# Patient Record
Sex: Female | Born: 1935 | Race: Black or African American | Hispanic: No | Marital: Single | Smoking: Never smoker
Health system: Southern US, Community
[De-identification: ages and names within clinical notes are randomized; demographics above are authoritative.]

## PROBLEM LIST (undated history)

## (undated) DIAGNOSIS — D539 Nutritional anemia, unspecified: Secondary | ICD-10-CM

## (undated) DIAGNOSIS — I639 Cerebral infarction, unspecified: Secondary | ICD-10-CM

## (undated) DIAGNOSIS — I471 Supraventricular tachycardia, unspecified: Secondary | ICD-10-CM

## (undated) DIAGNOSIS — I10 Essential (primary) hypertension: Secondary | ICD-10-CM

## (undated) DIAGNOSIS — F32A Depression, unspecified: Secondary | ICD-10-CM

## (undated) DIAGNOSIS — M810 Age-related osteoporosis without current pathological fracture: Secondary | ICD-10-CM

## (undated) DIAGNOSIS — M75100 Unspecified rotator cuff tear or rupture of unspecified shoulder, not specified as traumatic: Secondary | ICD-10-CM

## (undated) DIAGNOSIS — G2581 Restless legs syndrome: Secondary | ICD-10-CM

## (undated) DIAGNOSIS — M12819 Other specific arthropathies, not elsewhere classified, unspecified shoulder: Secondary | ICD-10-CM

## (undated) DIAGNOSIS — R51 Headache: Secondary | ICD-10-CM

## (undated) DIAGNOSIS — K219 Gastro-esophageal reflux disease without esophagitis: Secondary | ICD-10-CM

## (undated) DIAGNOSIS — R195 Other fecal abnormalities: Secondary | ICD-10-CM

## (undated) DIAGNOSIS — R519 Headache, unspecified: Secondary | ICD-10-CM

## (undated) DIAGNOSIS — M48061 Spinal stenosis, lumbar region without neurogenic claudication: Secondary | ICD-10-CM

## (undated) DIAGNOSIS — N289 Disorder of kidney and ureter, unspecified: Secondary | ICD-10-CM

## (undated) DIAGNOSIS — E875 Hyperkalemia: Secondary | ICD-10-CM

## (undated) DIAGNOSIS — G894 Chronic pain syndrome: Secondary | ICD-10-CM

## (undated) DIAGNOSIS — D649 Anemia, unspecified: Secondary | ICD-10-CM

## (undated) DIAGNOSIS — M1990 Unspecified osteoarthritis, unspecified site: Secondary | ICD-10-CM

## (undated) DIAGNOSIS — H269 Unspecified cataract: Secondary | ICD-10-CM

## (undated) DIAGNOSIS — F329 Major depressive disorder, single episode, unspecified: Secondary | ICD-10-CM

## (undated) DIAGNOSIS — R0982 Postnasal drip: Secondary | ICD-10-CM

## (undated) DIAGNOSIS — I4719 Other supraventricular tachycardia: Secondary | ICD-10-CM

## (undated) DIAGNOSIS — Z9289 Personal history of other medical treatment: Secondary | ICD-10-CM

## (undated) HISTORY — PX: ABDOMINAL HYSTERECTOMY: SHX81

## (undated) HISTORY — PX: BACK SURGERY: SHX140

## (undated) HISTORY — DX: Essential (primary) hypertension: I10

## (undated) HISTORY — DX: Unspecified osteoarthritis, unspecified site: M19.90

## (undated) HISTORY — DX: Disorder of kidney and ureter, unspecified: N28.9

## (undated) HISTORY — DX: Depression, unspecified: F32.A

## (undated) HISTORY — DX: Restless legs syndrome: G25.81

## (undated) HISTORY — DX: Unspecified rotator cuff tear or rupture of unspecified shoulder, not specified as traumatic: M75.100

## (undated) HISTORY — PX: EYE SURGERY: SHX253

## (undated) HISTORY — DX: Age-related osteoporosis without current pathological fracture: M81.0

## (undated) HISTORY — DX: Nutritional anemia, unspecified: D53.9

## (undated) HISTORY — DX: Supraventricular tachycardia: I47.1

## (undated) HISTORY — DX: Postnasal drip: R09.82

## (undated) HISTORY — PX: ORIF FINGER FRACTURE: SHX2122

## (undated) HISTORY — DX: Anemia, unspecified: D64.9

## (undated) HISTORY — DX: Cerebral infarction, unspecified: I63.9

## (undated) HISTORY — DX: Chronic pain syndrome: G89.4

## (undated) HISTORY — DX: Headache, unspecified: R51.9

## (undated) HISTORY — DX: Unspecified cataract: H26.9

## (undated) HISTORY — PX: ROTATOR CUFF REPAIR: SHX139

## (undated) HISTORY — DX: Hyperkalemia: E87.5

## (undated) HISTORY — DX: Other supraventricular tachycardia: I47.19

## (undated) HISTORY — DX: Other fecal abnormalities: R19.5

## (undated) HISTORY — DX: Spinal stenosis, lumbar region without neurogenic claudication: M48.061

## (undated) HISTORY — DX: Other specific arthropathies, not elsewhere classified, unspecified shoulder: M12.819

## (undated) HISTORY — DX: Headache: R51

## (undated) HISTORY — DX: Major depressive disorder, single episode, unspecified: F32.9

---

## 1998-02-05 ENCOUNTER — Encounter: Admission: RE | Admit: 1998-02-05 | Discharge: 1998-02-05 | Payer: Self-pay | Admitting: Hematology and Oncology

## 1998-06-22 ENCOUNTER — Encounter: Admission: RE | Admit: 1998-06-22 | Discharge: 1998-06-22 | Payer: Self-pay | Admitting: Hematology and Oncology

## 1998-07-17 ENCOUNTER — Ambulatory Visit (HOSPITAL_COMMUNITY): Admission: RE | Admit: 1998-07-17 | Discharge: 1998-07-17 | Payer: Self-pay | Admitting: Internal Medicine

## 1998-07-17 ENCOUNTER — Encounter: Admission: RE | Admit: 1998-07-17 | Discharge: 1998-07-17 | Payer: Self-pay | Admitting: Internal Medicine

## 1998-07-29 ENCOUNTER — Encounter: Admission: RE | Admit: 1998-07-29 | Discharge: 1998-07-29 | Payer: Self-pay | Admitting: Hematology and Oncology

## 1998-08-04 ENCOUNTER — Encounter: Admission: RE | Admit: 1998-08-04 | Discharge: 1998-11-02 | Payer: Self-pay | Admitting: *Deleted

## 1998-09-25 ENCOUNTER — Encounter: Admission: RE | Admit: 1998-09-25 | Discharge: 1998-09-25 | Payer: Self-pay | Admitting: Hematology and Oncology

## 1998-10-05 ENCOUNTER — Ambulatory Visit (HOSPITAL_COMMUNITY): Admission: RE | Admit: 1998-10-05 | Discharge: 1998-10-05 | Payer: Self-pay | Admitting: *Deleted

## 1998-11-17 ENCOUNTER — Ambulatory Visit (HOSPITAL_COMMUNITY): Admission: RE | Admit: 1998-11-17 | Discharge: 1998-11-17 | Payer: Self-pay | Admitting: Neurosurgery

## 1998-11-17 ENCOUNTER — Encounter: Payer: Self-pay | Admitting: Neurosurgery

## 1998-12-31 ENCOUNTER — Encounter: Payer: Self-pay | Admitting: Neurosurgery

## 1999-01-05 ENCOUNTER — Encounter: Payer: Self-pay | Admitting: Neurosurgery

## 1999-01-05 ENCOUNTER — Inpatient Hospital Stay (HOSPITAL_COMMUNITY): Admission: RE | Admit: 1999-01-05 | Discharge: 1999-01-08 | Payer: Self-pay | Admitting: Neurosurgery

## 1999-02-08 ENCOUNTER — Encounter: Payer: Self-pay | Admitting: Neurosurgery

## 1999-02-08 ENCOUNTER — Ambulatory Visit (HOSPITAL_COMMUNITY): Admission: RE | Admit: 1999-02-08 | Discharge: 1999-02-08 | Payer: Self-pay | Admitting: Neurosurgery

## 1999-04-12 ENCOUNTER — Encounter: Admission: RE | Admit: 1999-04-12 | Discharge: 1999-04-12 | Payer: Self-pay | Admitting: Internal Medicine

## 1999-04-21 ENCOUNTER — Encounter: Payer: Self-pay | Admitting: Neurosurgery

## 1999-04-21 ENCOUNTER — Ambulatory Visit (HOSPITAL_COMMUNITY): Admission: RE | Admit: 1999-04-21 | Discharge: 1999-04-21 | Payer: Self-pay | Admitting: Neurosurgery

## 1999-06-28 ENCOUNTER — Encounter: Admission: RE | Admit: 1999-06-28 | Discharge: 1999-06-28 | Payer: Self-pay | Admitting: Internal Medicine

## 1999-10-25 ENCOUNTER — Encounter: Admission: RE | Admit: 1999-10-25 | Discharge: 1999-10-25 | Payer: Self-pay | Admitting: Internal Medicine

## 1999-11-03 ENCOUNTER — Ambulatory Visit (HOSPITAL_COMMUNITY): Admission: RE | Admit: 1999-11-03 | Discharge: 1999-11-03 | Payer: Self-pay | Admitting: *Deleted

## 1999-11-16 ENCOUNTER — Encounter: Payer: Self-pay | Admitting: Neurosurgery

## 1999-11-16 ENCOUNTER — Ambulatory Visit (HOSPITAL_COMMUNITY): Admission: RE | Admit: 1999-11-16 | Discharge: 1999-11-16 | Payer: Self-pay | Admitting: Neurosurgery

## 2000-01-31 ENCOUNTER — Encounter: Admission: RE | Admit: 2000-01-31 | Discharge: 2000-01-31 | Payer: Self-pay | Admitting: Internal Medicine

## 2000-04-18 ENCOUNTER — Encounter: Admission: RE | Admit: 2000-04-18 | Discharge: 2000-04-18 | Payer: Self-pay | Admitting: Internal Medicine

## 2000-07-06 ENCOUNTER — Encounter: Admission: RE | Admit: 2000-07-06 | Discharge: 2000-07-06 | Payer: Self-pay | Admitting: Hematology and Oncology

## 2000-11-09 ENCOUNTER — Ambulatory Visit (HOSPITAL_COMMUNITY): Admission: RE | Admit: 2000-11-09 | Discharge: 2000-11-09 | Payer: Self-pay | Admitting: *Deleted

## 2001-01-11 ENCOUNTER — Encounter: Admission: RE | Admit: 2001-01-11 | Discharge: 2001-01-11 | Payer: Self-pay | Admitting: Hematology and Oncology

## 2001-06-29 ENCOUNTER — Encounter: Admission: RE | Admit: 2001-06-29 | Discharge: 2001-06-29 | Payer: Self-pay | Admitting: Internal Medicine

## 2001-09-03 ENCOUNTER — Encounter: Admission: RE | Admit: 2001-09-03 | Discharge: 2001-09-03 | Payer: Self-pay | Admitting: Internal Medicine

## 2001-11-12 ENCOUNTER — Ambulatory Visit (HOSPITAL_COMMUNITY): Admission: RE | Admit: 2001-11-12 | Discharge: 2001-11-12 | Payer: Self-pay | Admitting: Internal Medicine

## 2001-11-12 ENCOUNTER — Encounter: Payer: Self-pay | Admitting: Internal Medicine

## 2002-01-11 ENCOUNTER — Encounter: Admission: RE | Admit: 2002-01-11 | Discharge: 2002-01-11 | Payer: Self-pay | Admitting: Internal Medicine

## 2002-04-26 ENCOUNTER — Encounter: Admission: RE | Admit: 2002-04-26 | Discharge: 2002-04-26 | Payer: Self-pay | Admitting: Internal Medicine

## 2002-07-03 ENCOUNTER — Encounter: Admission: RE | Admit: 2002-07-03 | Discharge: 2002-07-03 | Payer: Self-pay | Admitting: Internal Medicine

## 2002-07-17 ENCOUNTER — Encounter: Admission: RE | Admit: 2002-07-17 | Discharge: 2002-07-17 | Payer: Self-pay | Admitting: Internal Medicine

## 2002-09-02 ENCOUNTER — Encounter: Admission: RE | Admit: 2002-09-02 | Discharge: 2002-09-02 | Payer: Self-pay | Admitting: Internal Medicine

## 2002-09-17 ENCOUNTER — Ambulatory Visit (HOSPITAL_COMMUNITY): Admission: RE | Admit: 2002-09-17 | Discharge: 2002-09-17 | Payer: Self-pay | Admitting: Internal Medicine

## 2002-09-17 ENCOUNTER — Encounter: Payer: Self-pay | Admitting: Internal Medicine

## 2002-09-17 ENCOUNTER — Encounter: Admission: RE | Admit: 2002-09-17 | Discharge: 2002-09-17 | Payer: Self-pay | Admitting: Internal Medicine

## 2002-10-10 HISTORY — PX: DOPPLER ECHOCARDIOGRAPHY: SHX263

## 2002-10-16 ENCOUNTER — Encounter: Admission: RE | Admit: 2002-10-16 | Discharge: 2002-10-16 | Payer: Self-pay | Admitting: Internal Medicine

## 2002-10-24 ENCOUNTER — Encounter: Admission: RE | Admit: 2002-10-24 | Discharge: 2002-10-24 | Payer: Self-pay | Admitting: Internal Medicine

## 2002-11-12 ENCOUNTER — Encounter: Payer: Self-pay | Admitting: Neurosurgery

## 2002-11-12 ENCOUNTER — Ambulatory Visit (HOSPITAL_COMMUNITY): Admission: RE | Admit: 2002-11-12 | Discharge: 2002-11-12 | Payer: Self-pay | Admitting: Neurosurgery

## 2002-11-14 ENCOUNTER — Encounter: Payer: Self-pay | Admitting: Internal Medicine

## 2002-11-14 ENCOUNTER — Ambulatory Visit (HOSPITAL_COMMUNITY): Admission: RE | Admit: 2002-11-14 | Discharge: 2002-11-14 | Payer: Self-pay | Admitting: Internal Medicine

## 2002-12-11 ENCOUNTER — Ambulatory Visit (HOSPITAL_COMMUNITY): Admission: RE | Admit: 2002-12-11 | Discharge: 2002-12-11 | Payer: Self-pay | Admitting: Internal Medicine

## 2002-12-11 ENCOUNTER — Encounter: Payer: Self-pay | Admitting: Internal Medicine

## 2002-12-11 ENCOUNTER — Encounter: Admission: RE | Admit: 2002-12-11 | Discharge: 2002-12-11 | Payer: Self-pay | Admitting: Internal Medicine

## 2002-12-25 ENCOUNTER — Encounter: Payer: Self-pay | Admitting: Cardiology

## 2002-12-25 ENCOUNTER — Ambulatory Visit (HOSPITAL_COMMUNITY): Admission: RE | Admit: 2002-12-25 | Discharge: 2002-12-25 | Payer: Self-pay | Admitting: Internal Medicine

## 2003-01-03 ENCOUNTER — Encounter: Admission: RE | Admit: 2003-01-03 | Discharge: 2003-01-03 | Payer: Self-pay | Admitting: Internal Medicine

## 2003-02-12 ENCOUNTER — Encounter: Payer: Self-pay | Admitting: Neurosurgery

## 2003-02-17 ENCOUNTER — Encounter: Payer: Self-pay | Admitting: Neurosurgery

## 2003-02-17 ENCOUNTER — Inpatient Hospital Stay (HOSPITAL_COMMUNITY): Admission: RE | Admit: 2003-02-17 | Discharge: 2003-02-20 | Payer: Self-pay | Admitting: Neurosurgery

## 2003-03-11 ENCOUNTER — Encounter: Payer: Self-pay | Admitting: Neurosurgery

## 2003-03-11 ENCOUNTER — Encounter: Admission: RE | Admit: 2003-03-11 | Discharge: 2003-03-11 | Payer: Self-pay | Admitting: Neurosurgery

## 2003-05-15 ENCOUNTER — Encounter: Payer: Self-pay | Admitting: Neurosurgery

## 2003-05-15 ENCOUNTER — Encounter: Admission: RE | Admit: 2003-05-15 | Discharge: 2003-05-15 | Payer: Self-pay | Admitting: Neurosurgery

## 2003-06-26 ENCOUNTER — Encounter: Payer: Self-pay | Admitting: Neurosurgery

## 2003-06-26 ENCOUNTER — Encounter: Admission: RE | Admit: 2003-06-26 | Discharge: 2003-06-26 | Payer: Self-pay | Admitting: Neurosurgery

## 2003-07-16 ENCOUNTER — Encounter: Admission: RE | Admit: 2003-07-16 | Discharge: 2003-07-16 | Payer: Self-pay | Admitting: Internal Medicine

## 2003-07-18 ENCOUNTER — Ambulatory Visit (HOSPITAL_COMMUNITY): Admission: RE | Admit: 2003-07-18 | Discharge: 2003-07-18 | Payer: Self-pay | Admitting: Neurosurgery

## 2003-07-18 ENCOUNTER — Encounter: Payer: Self-pay | Admitting: Neurosurgery

## 2003-09-26 ENCOUNTER — Encounter: Admission: RE | Admit: 2003-09-26 | Discharge: 2003-09-26 | Payer: Self-pay | Admitting: Internal Medicine

## 2003-10-07 ENCOUNTER — Encounter: Admission: RE | Admit: 2003-10-07 | Discharge: 2003-10-07 | Payer: Self-pay | Admitting: Internal Medicine

## 2003-10-07 ENCOUNTER — Encounter (INDEPENDENT_AMBULATORY_CARE_PROVIDER_SITE_OTHER): Payer: Self-pay | Admitting: Internal Medicine

## 2003-10-08 ENCOUNTER — Encounter (HOSPITAL_BASED_OUTPATIENT_CLINIC_OR_DEPARTMENT_OTHER): Admission: RE | Admit: 2003-10-08 | Discharge: 2003-10-28 | Payer: Self-pay | Admitting: Internal Medicine

## 2003-10-09 ENCOUNTER — Encounter: Admission: RE | Admit: 2003-10-09 | Discharge: 2003-10-09 | Payer: Self-pay | Admitting: Internal Medicine

## 2003-11-12 ENCOUNTER — Ambulatory Visit (HOSPITAL_COMMUNITY): Admission: RE | Admit: 2003-11-12 | Discharge: 2003-11-12 | Payer: Self-pay | Admitting: Internal Medicine

## 2003-11-14 ENCOUNTER — Encounter: Admission: RE | Admit: 2003-11-14 | Discharge: 2003-11-14 | Payer: Self-pay | Admitting: Internal Medicine

## 2003-11-28 ENCOUNTER — Encounter: Admission: RE | Admit: 2003-11-28 | Discharge: 2003-11-28 | Payer: Self-pay | Admitting: Internal Medicine

## 2003-12-13 ENCOUNTER — Ambulatory Visit (HOSPITAL_COMMUNITY): Admission: RE | Admit: 2003-12-13 | Discharge: 2003-12-13 | Payer: Self-pay | Admitting: Internal Medicine

## 2003-12-16 ENCOUNTER — Encounter: Admission: RE | Admit: 2003-12-16 | Discharge: 2003-12-16 | Payer: Self-pay | Admitting: Neurosurgery

## 2003-12-31 ENCOUNTER — Encounter: Admission: RE | Admit: 2003-12-31 | Discharge: 2003-12-31 | Payer: Self-pay | Admitting: Neurosurgery

## 2004-01-21 ENCOUNTER — Encounter: Admission: RE | Admit: 2004-01-21 | Discharge: 2004-01-21 | Payer: Self-pay | Admitting: Neurosurgery

## 2004-02-06 ENCOUNTER — Encounter: Admission: RE | Admit: 2004-02-06 | Discharge: 2004-02-06 | Payer: Self-pay | Admitting: Internal Medicine

## 2004-03-26 ENCOUNTER — Encounter: Admission: RE | Admit: 2004-03-26 | Discharge: 2004-03-26 | Payer: Self-pay | Admitting: Internal Medicine

## 2004-05-04 ENCOUNTER — Encounter: Admission: RE | Admit: 2004-05-04 | Discharge: 2004-05-04 | Payer: Self-pay | Admitting: Internal Medicine

## 2004-05-04 ENCOUNTER — Ambulatory Visit (HOSPITAL_COMMUNITY): Admission: RE | Admit: 2004-05-04 | Discharge: 2004-05-04 | Payer: Self-pay | Admitting: Internal Medicine

## 2004-06-18 ENCOUNTER — Ambulatory Visit: Payer: Self-pay | Admitting: Internal Medicine

## 2004-07-09 ENCOUNTER — Ambulatory Visit: Payer: Self-pay | Admitting: Internal Medicine

## 2004-07-16 ENCOUNTER — Encounter (INDEPENDENT_AMBULATORY_CARE_PROVIDER_SITE_OTHER): Payer: Self-pay | Admitting: Specialist

## 2004-07-16 ENCOUNTER — Ambulatory Visit (HOSPITAL_COMMUNITY): Admission: RE | Admit: 2004-07-16 | Discharge: 2004-07-16 | Payer: Self-pay | Admitting: General Surgery

## 2004-07-29 ENCOUNTER — Ambulatory Visit: Payer: Self-pay | Admitting: Internal Medicine

## 2004-09-17 ENCOUNTER — Ambulatory Visit: Payer: Self-pay | Admitting: Internal Medicine

## 2004-09-22 ENCOUNTER — Ambulatory Visit (HOSPITAL_COMMUNITY): Admission: RE | Admit: 2004-09-22 | Discharge: 2004-09-22 | Payer: Self-pay | Admitting: Internal Medicine

## 2004-09-27 ENCOUNTER — Encounter: Admission: RE | Admit: 2004-09-27 | Discharge: 2004-09-27 | Payer: Self-pay | Admitting: Neurosurgery

## 2004-10-13 ENCOUNTER — Encounter: Admission: RE | Admit: 2004-10-13 | Discharge: 2004-10-13 | Payer: Self-pay | Admitting: Neurosurgery

## 2004-11-04 ENCOUNTER — Encounter: Admission: RE | Admit: 2004-11-04 | Discharge: 2004-11-04 | Payer: Self-pay | Admitting: Neurosurgery

## 2004-11-12 ENCOUNTER — Ambulatory Visit (HOSPITAL_COMMUNITY): Admission: RE | Admit: 2004-11-12 | Discharge: 2004-11-12 | Payer: Self-pay | Admitting: Internal Medicine

## 2005-01-14 ENCOUNTER — Ambulatory Visit: Payer: Self-pay | Admitting: Internal Medicine

## 2005-02-04 ENCOUNTER — Ambulatory Visit: Payer: Self-pay | Admitting: Internal Medicine

## 2005-02-21 ENCOUNTER — Encounter (INDEPENDENT_AMBULATORY_CARE_PROVIDER_SITE_OTHER): Payer: Self-pay | Admitting: Specialist

## 2005-02-21 ENCOUNTER — Ambulatory Visit (HOSPITAL_COMMUNITY): Admission: RE | Admit: 2005-02-21 | Discharge: 2005-02-21 | Payer: Self-pay | Admitting: Gastroenterology

## 2005-02-22 ENCOUNTER — Ambulatory Visit (HOSPITAL_COMMUNITY): Admission: RE | Admit: 2005-02-22 | Discharge: 2005-02-22 | Payer: Self-pay | Admitting: Gastroenterology

## 2005-02-22 ENCOUNTER — Ambulatory Visit: Payer: Self-pay | Admitting: Internal Medicine

## 2005-02-24 ENCOUNTER — Ambulatory Visit (HOSPITAL_COMMUNITY): Admission: RE | Admit: 2005-02-24 | Discharge: 2005-02-24 | Payer: Self-pay | Admitting: Gastroenterology

## 2005-03-08 ENCOUNTER — Ambulatory Visit: Payer: Self-pay | Admitting: Internal Medicine

## 2005-03-11 ENCOUNTER — Ambulatory Visit (HOSPITAL_COMMUNITY): Admission: RE | Admit: 2005-03-11 | Discharge: 2005-03-11 | Payer: Self-pay | Admitting: Internal Medicine

## 2005-03-22 ENCOUNTER — Ambulatory Visit: Payer: Self-pay | Admitting: Internal Medicine

## 2005-08-23 ENCOUNTER — Ambulatory Visit: Payer: Self-pay | Admitting: Internal Medicine

## 2005-08-30 ENCOUNTER — Ambulatory Visit: Payer: Self-pay | Admitting: Internal Medicine

## 2005-08-31 ENCOUNTER — Ambulatory Visit: Payer: Self-pay | Admitting: Internal Medicine

## 2005-12-23 ENCOUNTER — Encounter (INDEPENDENT_AMBULATORY_CARE_PROVIDER_SITE_OTHER): Payer: Self-pay | Admitting: Internal Medicine

## 2005-12-23 ENCOUNTER — Ambulatory Visit (HOSPITAL_COMMUNITY): Admission: RE | Admit: 2005-12-23 | Discharge: 2005-12-23 | Payer: Self-pay | Admitting: Internal Medicine

## 2006-02-14 ENCOUNTER — Ambulatory Visit: Payer: Self-pay | Admitting: Internal Medicine

## 2006-03-03 ENCOUNTER — Ambulatory Visit (HOSPITAL_COMMUNITY): Admission: RE | Admit: 2006-03-03 | Discharge: 2006-03-03 | Payer: Self-pay | Admitting: Internal Medicine

## 2006-03-07 ENCOUNTER — Ambulatory Visit (HOSPITAL_COMMUNITY): Admission: RE | Admit: 2006-03-07 | Discharge: 2006-03-07 | Payer: Self-pay | Admitting: Internal Medicine

## 2006-03-14 ENCOUNTER — Encounter: Admission: RE | Admit: 2006-03-14 | Discharge: 2006-03-14 | Payer: Self-pay | Admitting: Internal Medicine

## 2006-03-14 ENCOUNTER — Ambulatory Visit: Payer: Self-pay | Admitting: Internal Medicine

## 2006-03-15 ENCOUNTER — Encounter: Payer: Self-pay | Admitting: Internal Medicine

## 2006-03-28 ENCOUNTER — Ambulatory Visit: Payer: Self-pay | Admitting: Internal Medicine

## 2006-04-06 ENCOUNTER — Ambulatory Visit: Payer: Self-pay | Admitting: Internal Medicine

## 2006-08-07 ENCOUNTER — Encounter: Admission: RE | Admit: 2006-08-07 | Discharge: 2006-08-07 | Payer: Self-pay | Admitting: Neurosurgery

## 2006-08-16 DIAGNOSIS — K649 Unspecified hemorrhoids: Secondary | ICD-10-CM | POA: Insufficient documentation

## 2006-08-16 DIAGNOSIS — H269 Unspecified cataract: Secondary | ICD-10-CM

## 2006-12-29 ENCOUNTER — Ambulatory Visit (HOSPITAL_COMMUNITY): Admission: RE | Admit: 2006-12-29 | Discharge: 2006-12-29 | Payer: Self-pay | Admitting: Family Medicine

## 2007-02-02 ENCOUNTER — Ambulatory Visit: Payer: Self-pay | Admitting: Hospitalist

## 2007-02-02 ENCOUNTER — Encounter (INDEPENDENT_AMBULATORY_CARE_PROVIDER_SITE_OTHER): Payer: Self-pay | Admitting: Internal Medicine

## 2007-02-02 DIAGNOSIS — J309 Allergic rhinitis, unspecified: Secondary | ICD-10-CM | POA: Insufficient documentation

## 2007-02-02 DIAGNOSIS — M75120 Complete rotator cuff tear or rupture of unspecified shoulder, not specified as traumatic: Secondary | ICD-10-CM

## 2007-02-02 DIAGNOSIS — M48061 Spinal stenosis, lumbar region without neurogenic claudication: Secondary | ICD-10-CM | POA: Insufficient documentation

## 2007-02-09 ENCOUNTER — Ambulatory Visit: Payer: Self-pay | Admitting: *Deleted

## 2007-02-09 ENCOUNTER — Encounter (INDEPENDENT_AMBULATORY_CARE_PROVIDER_SITE_OTHER): Payer: Self-pay | Admitting: Internal Medicine

## 2007-02-09 LAB — CONVERTED CEMR LAB
Cholesterol: 167 mg/dL (ref 0–200)
HDL: 52 mg/dL (ref >39)
Triglycerides: 135 mg/dL (ref <150)
VLDL: 27 mg/dL (ref 0–40)

## 2007-02-15 LAB — CONVERTED CEMR LAB
Albumin: 4 g/dL (ref 3.5–5.2)
BUN: 24 mg/dL — ABNORMAL HIGH (ref 6–23)
CO2: 25 meq/L (ref 19–32)
Glucose, Bld: 84 mg/dL (ref 70–99)
Hemoglobin: 9.7 g/dL — ABNORMAL LOW (ref 12.0–15.0)
MCV: 82.4 fL (ref 78.0–100.0)
RBC: 3.86 M/uL — ABNORMAL LOW (ref 3.87–5.11)
Sodium: 144 meq/L (ref 135–145)
Total Bilirubin: 0.3 mg/dL (ref 0.3–1.2)
Total Protein: 7.8 g/dL (ref 6.0–8.3)
WBC: 10.9 10*3/uL — ABNORMAL HIGH (ref 4.0–10.5)

## 2007-03-13 ENCOUNTER — Encounter (INDEPENDENT_AMBULATORY_CARE_PROVIDER_SITE_OTHER): Payer: Self-pay | Admitting: Internal Medicine

## 2007-03-13 ENCOUNTER — Ambulatory Visit: Payer: Self-pay | Admitting: Internal Medicine

## 2007-03-13 DIAGNOSIS — D649 Anemia, unspecified: Secondary | ICD-10-CM

## 2007-03-13 DIAGNOSIS — N259 Disorder resulting from impaired renal tubular function, unspecified: Secondary | ICD-10-CM | POA: Insufficient documentation

## 2007-03-13 LAB — CONVERTED CEMR LAB
HCT: 31.6 % — ABNORMAL LOW (ref 36.0–46.0)
Hemoglobin: 9.9 g/dL — ABNORMAL LOW (ref 12.0–15.0)
RBC: 3.98 M/uL (ref 3.87–5.11)
WBC: 9.6 10*3/uL (ref 4.0–10.5)

## 2007-03-14 LAB — CONVERTED CEMR LAB
Chloride: 109 meq/L (ref 96–112)
Ferritin: 11 ng/mL (ref 10–291)
Potassium: 4.7 meq/L (ref 3.5–5.3)
Saturation Ratios: 8 % — ABNORMAL LOW (ref 20–55)
TIBC: 468 ug/dL (ref 250–470)

## 2007-03-15 ENCOUNTER — Telehealth: Payer: Self-pay | Admitting: *Deleted

## 2007-03-15 LAB — CONVERTED CEMR LAB: OCCULT 3: NEGATIVE

## 2007-03-16 LAB — CONVERTED CEMR LAB: OCCULT 2: NEGATIVE

## 2007-03-19 ENCOUNTER — Ambulatory Visit: Payer: Self-pay | Admitting: Internal Medicine

## 2007-03-27 ENCOUNTER — Other Ambulatory Visit: Admission: RE | Admit: 2007-03-27 | Discharge: 2007-03-27 | Payer: Self-pay | Admitting: Internal Medicine

## 2007-03-27 ENCOUNTER — Ambulatory Visit: Payer: Self-pay | Admitting: Internal Medicine

## 2007-03-27 ENCOUNTER — Encounter (INDEPENDENT_AMBULATORY_CARE_PROVIDER_SITE_OTHER): Payer: Self-pay | Admitting: Internal Medicine

## 2007-05-15 ENCOUNTER — Encounter (INDEPENDENT_AMBULATORY_CARE_PROVIDER_SITE_OTHER): Payer: Self-pay | Admitting: Internal Medicine

## 2007-05-15 ENCOUNTER — Ambulatory Visit (HOSPITAL_COMMUNITY): Admission: RE | Admit: 2007-05-15 | Discharge: 2007-05-15 | Payer: Self-pay | Admitting: *Deleted

## 2007-05-15 ENCOUNTER — Ambulatory Visit: Payer: Self-pay | Admitting: *Deleted

## 2007-05-15 DIAGNOSIS — I10 Essential (primary) hypertension: Secondary | ICD-10-CM

## 2007-05-15 HISTORY — DX: Essential (primary) hypertension: I10

## 2007-05-25 ENCOUNTER — Ambulatory Visit: Payer: Self-pay | Admitting: Internal Medicine

## 2007-06-27 ENCOUNTER — Ambulatory Visit: Payer: Self-pay | Admitting: Internal Medicine

## 2007-06-27 ENCOUNTER — Encounter (INDEPENDENT_AMBULATORY_CARE_PROVIDER_SITE_OTHER): Payer: Self-pay | Admitting: Internal Medicine

## 2007-06-27 ENCOUNTER — Ambulatory Visit (HOSPITAL_COMMUNITY): Admission: RE | Admit: 2007-06-27 | Discharge: 2007-06-27 | Payer: Self-pay | Admitting: Internal Medicine

## 2007-06-27 DIAGNOSIS — M542 Cervicalgia: Secondary | ICD-10-CM | POA: Insufficient documentation

## 2007-06-27 LAB — CONVERTED CEMR LAB
Hemoglobin: 12.9 g/dL (ref 12.0–15.0)
RBC: 4.62 M/uL (ref 3.87–5.11)
RDW: 16.3 % — ABNORMAL HIGH (ref 11.5–14.0)
WBC: 9.8 10*3/uL (ref 4.0–10.5)

## 2007-08-16 ENCOUNTER — Ambulatory Visit: Payer: Self-pay | Admitting: *Deleted

## 2007-11-01 ENCOUNTER — Ambulatory Visit: Payer: Self-pay | Admitting: Internal Medicine

## 2007-11-01 ENCOUNTER — Encounter (INDEPENDENT_AMBULATORY_CARE_PROVIDER_SITE_OTHER): Payer: Self-pay | Admitting: Internal Medicine

## 2007-11-01 LAB — CONVERTED CEMR LAB
BUN: 18 mg/dL (ref 6–23)
Glucose, Bld: 83 mg/dL (ref 70–99)
Hemoglobin: 14.7 g/dL (ref 12.0–15.0)
MCHC: 33.5 g/dL (ref 30.0–36.0)
MCV: 88.7 fL (ref 78.0–100.0)
Potassium: 4 meq/L (ref 3.5–5.3)
RBC: 4.95 M/uL (ref 3.87–5.11)
Total CK: 89 units/L (ref 7–177)

## 2007-11-14 ENCOUNTER — Encounter (INDEPENDENT_AMBULATORY_CARE_PROVIDER_SITE_OTHER): Payer: Self-pay | Admitting: Internal Medicine

## 2007-12-03 ENCOUNTER — Ambulatory Visit: Payer: Self-pay | Admitting: Internal Medicine

## 2007-12-03 DIAGNOSIS — M25559 Pain in unspecified hip: Secondary | ICD-10-CM | POA: Insufficient documentation

## 2007-12-18 ENCOUNTER — Encounter (INDEPENDENT_AMBULATORY_CARE_PROVIDER_SITE_OTHER): Payer: Self-pay | Admitting: Internal Medicine

## 2007-12-25 ENCOUNTER — Telehealth (INDEPENDENT_AMBULATORY_CARE_PROVIDER_SITE_OTHER): Payer: Self-pay | Admitting: Internal Medicine

## 2007-12-28 ENCOUNTER — Telehealth (INDEPENDENT_AMBULATORY_CARE_PROVIDER_SITE_OTHER): Payer: Self-pay | Admitting: Internal Medicine

## 2007-12-31 ENCOUNTER — Telehealth (INDEPENDENT_AMBULATORY_CARE_PROVIDER_SITE_OTHER): Payer: Self-pay | Admitting: Internal Medicine

## 2008-01-01 ENCOUNTER — Ambulatory Visit (HOSPITAL_COMMUNITY): Admission: RE | Admit: 2008-01-01 | Discharge: 2008-01-01 | Payer: Self-pay | Admitting: Internal Medicine

## 2008-01-21 ENCOUNTER — Telehealth: Payer: Self-pay | Admitting: Internal Medicine

## 2008-01-31 ENCOUNTER — Ambulatory Visit: Payer: Self-pay | Admitting: Internal Medicine

## 2008-01-31 ENCOUNTER — Encounter (INDEPENDENT_AMBULATORY_CARE_PROVIDER_SITE_OTHER): Payer: Self-pay | Admitting: Internal Medicine

## 2008-01-31 DIAGNOSIS — G2581 Restless legs syndrome: Secondary | ICD-10-CM | POA: Insufficient documentation

## 2008-01-31 LAB — CONVERTED CEMR LAB
Ferritin: 180 ng/mL (ref 10–291)
MCV: 92.8 fL (ref 78.0–100.0)
RBC: 4.47 M/uL (ref 3.87–5.11)
UIBC: 283 ug/dL
WBC: 10.3 10*3/uL (ref 4.0–10.5)

## 2008-03-27 ENCOUNTER — Telehealth (INDEPENDENT_AMBULATORY_CARE_PROVIDER_SITE_OTHER): Payer: Self-pay | Admitting: Internal Medicine

## 2008-05-19 ENCOUNTER — Encounter (INDEPENDENT_AMBULATORY_CARE_PROVIDER_SITE_OTHER): Payer: Self-pay | Admitting: Internal Medicine

## 2008-05-19 ENCOUNTER — Ambulatory Visit: Payer: Self-pay | Admitting: Internal Medicine

## 2008-05-19 LAB — CONVERTED CEMR LAB
ALT: 15 units/L (ref 0–35)
Albumin: 3.9 g/dL (ref 3.5–5.2)
CO2: 24 meq/L (ref 19–32)
Calcium: 9.5 mg/dL (ref 8.4–10.5)
Chloride: 103 meq/L (ref 96–112)
Creatinine, Ser: 1.12 mg/dL (ref 0.40–1.20)
Potassium: 4.5 meq/L (ref 3.5–5.3)
Sodium: 141 meq/L (ref 135–145)
Total Protein: 8.1 g/dL (ref 6.0–8.3)

## 2008-05-20 ENCOUNTER — Encounter (INDEPENDENT_AMBULATORY_CARE_PROVIDER_SITE_OTHER): Payer: Self-pay | Admitting: Internal Medicine

## 2008-05-27 ENCOUNTER — Ambulatory Visit (HOSPITAL_COMMUNITY): Admission: RE | Admit: 2008-05-27 | Discharge: 2008-05-27 | Payer: Self-pay | Admitting: Internal Medicine

## 2008-05-29 ENCOUNTER — Encounter: Admission: RE | Admit: 2008-05-29 | Discharge: 2008-06-30 | Payer: Self-pay | Admitting: Internal Medicine

## 2008-06-13 ENCOUNTER — Encounter (INDEPENDENT_AMBULATORY_CARE_PROVIDER_SITE_OTHER): Payer: Self-pay | Admitting: Internal Medicine

## 2008-07-30 ENCOUNTER — Encounter (INDEPENDENT_AMBULATORY_CARE_PROVIDER_SITE_OTHER): Payer: Self-pay | Admitting: Internal Medicine

## 2008-09-15 ENCOUNTER — Ambulatory Visit: Payer: Self-pay | Admitting: Internal Medicine

## 2008-12-23 ENCOUNTER — Encounter (INDEPENDENT_AMBULATORY_CARE_PROVIDER_SITE_OTHER): Payer: Self-pay | Admitting: Internal Medicine

## 2008-12-23 ENCOUNTER — Ambulatory Visit: Payer: Self-pay | Admitting: *Deleted

## 2008-12-24 DIAGNOSIS — R740 Nonspecific elevation of levels of transaminase and lactic acid dehydrogenase [LDH]: Secondary | ICD-10-CM

## 2008-12-24 DIAGNOSIS — D72829 Elevated white blood cell count, unspecified: Secondary | ICD-10-CM | POA: Insufficient documentation

## 2008-12-24 LAB — CONVERTED CEMR LAB
ALT: 22 units/L (ref 0–35)
CO2: 22 meq/L (ref 19–32)
Calcium: 9.6 mg/dL (ref 8.4–10.5)
Chloride: 98 meq/L (ref 96–112)
Platelets: 477 10*3/uL — ABNORMAL HIGH (ref 150–400)
Potassium: 5 meq/L (ref 3.5–5.3)
Sodium: 136 meq/L (ref 135–145)
Total Protein: 7.7 g/dL (ref 6.0–8.3)
WBC: 15 10*3/uL — ABNORMAL HIGH (ref 4.0–10.5)

## 2008-12-25 ENCOUNTER — Encounter (INDEPENDENT_AMBULATORY_CARE_PROVIDER_SITE_OTHER): Payer: Self-pay | Admitting: Internal Medicine

## 2008-12-25 ENCOUNTER — Ambulatory Visit: Payer: Self-pay | Admitting: *Deleted

## 2008-12-25 LAB — CONVERTED CEMR LAB
Cholesterol: 141 mg/dL (ref 0–200)
VLDL: 20 mg/dL (ref 0–40)

## 2009-01-19 ENCOUNTER — Ambulatory Visit (HOSPITAL_COMMUNITY): Admission: RE | Admit: 2009-01-19 | Discharge: 2009-01-19 | Payer: Self-pay | Admitting: Internal Medicine

## 2009-01-26 ENCOUNTER — Ambulatory Visit: Payer: Self-pay | Admitting: Pediatrics

## 2009-01-26 ENCOUNTER — Telehealth: Payer: Self-pay | Admitting: *Deleted

## 2009-01-26 ENCOUNTER — Encounter: Payer: Self-pay | Admitting: Internal Medicine

## 2009-01-26 LAB — CONVERTED CEMR LAB
Bilirubin Urine: NEGATIVE
Hemoglobin, Urine: NEGATIVE
Ketones, ur: NEGATIVE mg/dL
Protein, ur: NEGATIVE mg/dL
Urine Glucose: NEGATIVE mg/dL
Urobilinogen, UA: 1 (ref 0.0–1.0)

## 2009-01-27 LAB — CONVERTED CEMR LAB
AST: 39 units/L — ABNORMAL HIGH (ref 0–37)
Albumin: 3.5 g/dL (ref 3.5–5.2)
BUN: 21 mg/dL (ref 6–23)
Basophils Absolute: 0.1 10*3/uL (ref 0.0–0.1)
Basophils Relative: 1 % (ref 0–1)
Calcium: 9.1 mg/dL (ref 8.4–10.5)
Chloride: 100 meq/L (ref 96–112)
Eosinophils Absolute: 0.2 10*3/uL (ref 0.0–0.7)
Eosinophils Relative: 2 % (ref 0–5)
Glucose, Bld: 100 mg/dL — ABNORMAL HIGH (ref 70–99)
Lymphs Abs: 3.4 10*3/uL (ref 0.7–4.0)
MCV: 85.3 fL (ref 78.0–100.0)
Neutrophils Relative %: 56 % (ref 43–77)
Platelets: 462 10*3/uL — ABNORMAL HIGH (ref 150–400)
Potassium: 3.4 meq/L — ABNORMAL LOW (ref 3.5–5.3)
RDW: 18 % — ABNORMAL HIGH (ref 11.5–15.5)
Sodium: 135 meq/L (ref 135–145)
Total Protein: 7.8 g/dL (ref 6.0–8.3)
WBC: 10.6 10*3/uL — ABNORMAL HIGH (ref 4.0–10.5)

## 2009-02-03 ENCOUNTER — Ambulatory Visit: Payer: Self-pay | Admitting: Internal Medicine

## 2009-02-17 ENCOUNTER — Encounter (INDEPENDENT_AMBULATORY_CARE_PROVIDER_SITE_OTHER): Payer: Self-pay | Admitting: Internal Medicine

## 2009-02-17 ENCOUNTER — Ambulatory Visit: Payer: Self-pay | Admitting: Internal Medicine

## 2009-02-17 LAB — CONVERTED CEMR LAB
Ferritin: 39 ng/mL (ref 10–291)
Iron: 30 ug/dL — ABNORMAL LOW (ref 42–145)
Saturation Ratios: 8 % — ABNORMAL LOW (ref 20–55)
TIBC: 388 ug/dL (ref 250–470)
UIBC: 358 ug/dL

## 2009-06-17 ENCOUNTER — Ambulatory Visit: Payer: Self-pay | Admitting: Infectious Diseases

## 2009-06-17 LAB — CONVERTED CEMR LAB
BUN: 14 mg/dL (ref 6–23)
Chloride: 102 meq/L (ref 96–112)
Hemoglobin: 12.4 g/dL (ref 12.0–15.0)
MCHC: 32.5 g/dL (ref 30.0–36.0)
Platelets: 370 10*3/uL (ref 150–400)
Potassium: 4.5 meq/L (ref 3.5–5.3)
RDW: 14.8 % (ref 11.5–15.5)

## 2009-06-18 ENCOUNTER — Telehealth: Payer: Self-pay | Admitting: Internal Medicine

## 2009-06-23 ENCOUNTER — Ambulatory Visit (HOSPITAL_COMMUNITY): Admission: RE | Admit: 2009-06-23 | Discharge: 2009-06-23 | Payer: Self-pay | Admitting: Infectious Diseases

## 2009-06-23 ENCOUNTER — Encounter: Payer: Self-pay | Admitting: Internal Medicine

## 2009-06-23 ENCOUNTER — Ambulatory Visit: Payer: Self-pay | Admitting: Infectious Disease

## 2009-06-23 DIAGNOSIS — K921 Melena: Secondary | ICD-10-CM | POA: Insufficient documentation

## 2009-06-23 LAB — CONVERTED CEMR LAB
OCCULT 1: POSITIVE
OCCULT 2: POSITIVE
OCCULT 3: POSITIVE

## 2009-07-07 ENCOUNTER — Encounter: Payer: Self-pay | Admitting: Internal Medicine

## 2009-07-15 ENCOUNTER — Encounter: Payer: Self-pay | Admitting: Internal Medicine

## 2009-07-17 ENCOUNTER — Encounter: Payer: Self-pay | Admitting: Internal Medicine

## 2009-08-06 ENCOUNTER — Encounter: Payer: Self-pay | Admitting: Internal Medicine

## 2009-12-07 ENCOUNTER — Ambulatory Visit: Payer: Self-pay | Admitting: Internal Medicine

## 2009-12-07 ENCOUNTER — Encounter: Payer: Self-pay | Admitting: Licensed Clinical Social Worker

## 2009-12-07 DIAGNOSIS — Z87898 Personal history of other specified conditions: Secondary | ICD-10-CM

## 2009-12-07 DIAGNOSIS — F329 Major depressive disorder, single episode, unspecified: Secondary | ICD-10-CM

## 2009-12-07 DIAGNOSIS — L851 Acquired keratosis [keratoderma] palmaris et plantaris: Secondary | ICD-10-CM

## 2009-12-07 DIAGNOSIS — R609 Edema, unspecified: Secondary | ICD-10-CM

## 2009-12-08 ENCOUNTER — Telehealth: Payer: Self-pay | Admitting: Licensed Clinical Social Worker

## 2009-12-09 LAB — CONVERTED CEMR LAB
ALT: <8 units/L (ref 0–35)
AST: 23 units/L (ref 0–37)
Albumin: 3 g/dL — ABNORMAL LOW (ref 3.5–5.2)
Alkaline Phosphatase: 81 units/L (ref 39–117)
Basophils Absolute: 0 10*3/uL (ref 0.0–0.1)
Basophils Relative: 0 % (ref 0–1)
Hemoglobin: 9.1 g/dL — ABNORMAL LOW (ref 12.0–15.0)
Lymphocytes Relative: 28 % (ref 12–46)
MCHC: 31.8 g/dL (ref 30.0–36.0)
Neutro Abs: 8 10*3/uL — ABNORMAL HIGH (ref 1.7–7.7)
Neutrophils Relative %: 62 % (ref 43–77)
Potassium: 3.8 meq/L (ref 3.5–5.3)
RBC: 3.35 M/uL — ABNORMAL LOW (ref 3.87–5.11)
RDW: 16.5 % — ABNORMAL HIGH (ref 11.5–15.5)
Sodium: 137 meq/L (ref 135–145)
Total Bilirubin: 0.6 mg/dL (ref 0.3–1.2)
Total Protein: 7.2 g/dL (ref 6.0–8.3)

## 2009-12-11 ENCOUNTER — Encounter: Payer: Self-pay | Admitting: Internal Medicine

## 2009-12-21 ENCOUNTER — Encounter: Payer: Self-pay | Admitting: Internal Medicine

## 2009-12-28 ENCOUNTER — Encounter: Payer: Self-pay | Admitting: Internal Medicine

## 2009-12-31 ENCOUNTER — Encounter: Payer: Self-pay | Admitting: Internal Medicine

## 2010-01-06 ENCOUNTER — Encounter: Payer: Self-pay | Admitting: Internal Medicine

## 2010-01-18 ENCOUNTER — Telehealth: Payer: Self-pay | Admitting: Licensed Clinical Social Worker

## 2010-02-03 ENCOUNTER — Ambulatory Visit: Payer: Self-pay | Admitting: Internal Medicine

## 2010-02-09 LAB — CONVERTED CEMR LAB
AST: 16 units/L (ref 0–37)
Alkaline Phosphatase: 86 units/L (ref 39–117)
BUN: 16 mg/dL (ref 6–23)
Calcium: 8.5 mg/dL (ref 8.4–10.5)
Chloride: 111 meq/L (ref 96–112)
Creatinine, Ser: 0.96 mg/dL (ref 0.40–1.20)
HCT: 25.4 % — ABNORMAL LOW (ref 36.0–46.0)
Hemoglobin: 7.6 g/dL — ABNORMAL LOW (ref 12.0–15.0)
MCHC: 29.9 g/dL — ABNORMAL LOW (ref 30.0–36.0)
MCV: 98.8 fL (ref >=78.0)
RDW: 20.8 % — ABNORMAL HIGH (ref 11.5–15.5)
Total Bilirubin: 0.3 mg/dL (ref 0.3–1.2)

## 2010-02-10 ENCOUNTER — Encounter: Payer: Self-pay | Admitting: Internal Medicine

## 2010-02-12 ENCOUNTER — Ambulatory Visit (HOSPITAL_COMMUNITY): Admission: RE | Admit: 2010-02-12 | Discharge: 2010-02-12 | Payer: Self-pay | Admitting: *Deleted

## 2010-02-23 ENCOUNTER — Ambulatory Visit: Payer: Self-pay | Admitting: Internal Medicine

## 2010-02-23 LAB — CONVERTED CEMR LAB: OCCULT 3: POSITIVE

## 2010-03-10 ENCOUNTER — Encounter: Payer: Self-pay | Admitting: Internal Medicine

## 2010-03-22 ENCOUNTER — Ambulatory Visit: Payer: Self-pay | Admitting: Internal Medicine

## 2010-03-22 LAB — CONVERTED CEMR LAB
ALT: 13 units/L (ref 0–35)
AST: 30 units/L (ref 0–37)
Albumin: 4 g/dL (ref 3.5–5.2)
Calcium: 9.4 mg/dL (ref 8.4–10.5)
Chloride: 105 meq/L (ref 96–112)
Eosinophils Relative: 4 % (ref 0–5)
HCT: 35.6 % — ABNORMAL LOW (ref 36.0–46.0)
Lymphocytes Relative: 37 % (ref 12–46)
Lymphs Abs: 3.7 10*3/uL (ref 0.7–4.0)
Monocytes Relative: 10 % (ref 3–12)
Platelets: 396 10*3/uL (ref 150–400)
Potassium: 4.3 meq/L (ref 3.5–5.3)
RBC: 3.9 M/uL (ref 3.87–5.11)
WBC: 10.1 10*3/uL (ref 4.0–10.5)

## 2010-03-29 ENCOUNTER — Telehealth: Payer: Self-pay | Admitting: Licensed Clinical Social Worker

## 2010-05-11 ENCOUNTER — Telehealth: Payer: Self-pay | Admitting: Internal Medicine

## 2010-06-08 ENCOUNTER — Telehealth: Payer: Self-pay | Admitting: Internal Medicine

## 2010-06-09 ENCOUNTER — Ambulatory Visit: Payer: Self-pay | Admitting: Internal Medicine

## 2010-06-09 LAB — CONVERTED CEMR LAB
ALT: 9 U/L
AST: 19 U/L
Albumin: 3.6 g/dL
Alkaline Phosphatase: 102 U/L
BUN: 15 mg/dL
Basophils Absolute: 0 10*3/uL
Basophils Relative: 0 %
CO2: 26 meq/L
Calcium: 8.8 mg/dL
Chloride: 108 meq/L
Creatinine, Ser: 1.01 mg/dL
Eosinophils Absolute: 0.4 10*3/uL
Eosinophils Relative: 3 %
Ferritin: 40 ng/mL
Glucose, Bld: 92 mg/dL
HCT: 27.9 % — ABNORMAL LOW
Hemoglobin: 8.4 g/dL — ABNORMAL LOW
Lymphocytes Relative: 32 %
Lymphs Abs: 3.6 10*3/uL
MCHC: 30.1 g/dL
MCV: 94.3 fL
Monocytes Absolute: 1 10*3/uL
Monocytes Relative: 9 %
Neutro Abs: 6.2 10*3/uL
Neutrophils Relative %: 56 %
Platelets: 526 10*3/uL — ABNORMAL HIGH
Potassium: 4.3 meq/L
RBC: 2.96 M/uL — ABNORMAL LOW
RDW: 17.5 % — ABNORMAL HIGH
Sodium: 144 meq/L
Total Bilirubin: 0.3 mg/dL
Total Protein: 7 g/dL
WBC: 11.2 10*3/uL — ABNORMAL HIGH

## 2010-06-17 ENCOUNTER — Telehealth: Payer: Self-pay | Admitting: Internal Medicine

## 2010-09-20 ENCOUNTER — Telehealth: Payer: Self-pay | Admitting: Internal Medicine

## 2010-09-22 ENCOUNTER — Ambulatory Visit: Payer: Self-pay | Admitting: Internal Medicine

## 2010-09-22 DIAGNOSIS — M25519 Pain in unspecified shoulder: Secondary | ICD-10-CM

## 2010-09-22 LAB — CONVERTED CEMR LAB
Calcium: 8.7 mg/dL (ref 8.4–10.5)
MCHC: 31.9 g/dL (ref 30.0–36.0)
MCV: 93.7 fL (ref 78.0–100.0)
Platelets: 364 10*3/uL (ref 150–400)
Sodium: 141 meq/L (ref 135–145)

## 2010-10-11 ENCOUNTER — Telehealth: Payer: Self-pay | Admitting: Internal Medicine

## 2010-10-29 ENCOUNTER — Telehealth (INDEPENDENT_AMBULATORY_CARE_PROVIDER_SITE_OTHER): Payer: Self-pay | Admitting: *Deleted

## 2010-11-11 NOTE — Progress Notes (Signed)
Summary: PAP and Mammogram result information  Phone Note Outgoing Call   Summary of Call: PAP new date found per EMR and [facility]  March 27, 2007  Mammogram new date found per [facility]  Feb 12, 2010  Alric Quan  October 29, 2010 1:55 PM

## 2010-11-11 NOTE — Progress Notes (Signed)
Summary: Soc. Work/[facility]  Phone Note Outgoing Call   Call placed by: Social Work Call placed to: [facility] Summary of Call: Placed order for PT/OT and also to assess need for power wheelchair thru [facility].  Patient and family did not have preference of agencies.  Disclosed ownership information regarding [facility].

## 2010-11-11 NOTE — Progress Notes (Signed)
Summary: Refill/gh  Phone Note Refill Request Message from:  Patient on October 11, 2010 9:29 AM  Refills Requested: Medication #1:  ALAVERT 10 MG TABS Take 1 tablet by mouth once a day Last office visit and labs were 09/2010.   Method Requested: Electronic Initial call taken by: Angelina Ok RN,  October 11, 2010 9:32 AM  Follow-up for Phone Call       Follow-up by: Ulyess Mort MD,  October 11, 2010 11:30 AM    Prescriptions: ALAVERT 10 MG TABS (LORATADINE) Take 1 tablet by mouth once a day  #31 x 5   Entered and Authorized by:   Ulyess Mort MD   Signed by:   Ulyess Mort MD on 10/11/2010   Method used:   [facility]        [facility]  [address] [phone]* (retail)       [address]       [place], [place]  [id number]       Ph: [phone] or [phone]       Fax: [phone]   RxID:   [id number]

## 2010-11-11 NOTE — Assessment & Plan Note (Signed)
Summary: est-ck/fu/meds/cfb   Vital Signs:  Patient profile:   77 year old female Height:      64 inches (162.56 cm) Weight:      143.2 pounds (65.09 kg) BMI:     24.67 Temp:     98.7 degrees F (37.06 degrees C) oral Pulse rate:   70 / minute BP sitting:   115 / 77  (left arm) Cuff size:   regular  Vitals Entered By: Cynda Familia Duncan Dull) (June 09, 2010 1:33 PM) CC: pt here for routine f/u, still having back pain, no longer taking loratadine 10mg  tabs because it cost too much, med refill Is Patient Diabetic? No Pain Assessment Patient in pain? yes     Location: lower back Intensity: 10 Type: sharp Nutritional Status BMI of 19 -24 = normal  Does patient need assistance? Functional Status Self care Ambulation Impaired:Risk for fall Comments walker   Primary Care Daurice Ovando:  Vassie Loll MD  CC:  pt here for routine f/u, still having back pain, no longer taking loratadine 10mg  tabs because it cost too much, and med refill.  History of Present Illness: 77 y/o female with pmh as describe din the EMR; who comes tot he clinic for followup.   Patient is currently complaining of back pain and also dry cough/hoarseness (last two has been going on for the last month, after she stop taking her loratadine due to cost).  Patient denies weight loss, fever, numbness on her feet/legs, urine or fecal incontinence or any other complaints.  She is compliant with her medications and reports that her mood and appetite are a lot better.  Patient is asking for medication refills (including lidoderm patchs for her back) and also requesting flu shot today.  Depression History:      The patient denies a depressed mood most of the day and a diminished interest in her usual daily activities.        The patient denies that she feels like life is not worth living, denies that she wishes that she were dead, and denies that she has thought about ending her life.         Problems Prior to  Update: 1)  Leg Edema, Bilateral  (ICD-782.3) 2)  Dry Skin  (ICD-701.1) 3)  Personal History of Other Specified Diseases  (ICD-V13.8) 4)  Depression  (ICD-311) 5)  Hemoccult Positive Stool  (ICD-578.1) 6)  Leukocytosis  (ICD-288.60) 7)  Transaminases, Serum, Elevated  (ICD-790.4) 8)  Restless Leg Syndrome  (ICD-333.94) 9)  Hip Pain, Left  (ICD-719.45) 10)  Neck Pain  (ICD-723.1) 11)  Hypertension  (ICD-401.9) 12)  Health Maintenance Exam  (ICD-V70.0) 13)  Renal Insufficiency, Mild  (ICD-588.9) 14)  Anemia Nos  (ICD-285.9) 15)  Postnasal Drip Syndrome, Allergic  (ICD-477.9) 16)  Spinal Stenosis, Lumbar  (ICD-724.02) 17)  Rupture, Rotator Cuff  (ICD-727.61) 18)  Cataracts, Bilateral  (ICD-366.9) 19)  Hemorrhoids  (ICD-455.6)  Current Problems (verified): 1)  Leg Edema, Bilateral  (ICD-782.3) 2)  Dry Skin  (ICD-701.1) 3)  Personal History of Other Specified Diseases  (ICD-V13.8) 4)  Depression  (ICD-311) 5)  Hemoccult Positive Stool  (ICD-578.1) 6)  Leukocytosis  (ICD-288.60) 7)  Transaminases, Serum, Elevated  (ICD-790.4) 8)  Restless Leg Syndrome  (ICD-333.94) 9)  Hip Pain, Left  (ICD-719.45) 10)  Neck Pain  (ICD-723.1) 11)  Hypertension  (ICD-401.9) 12)  Health Maintenance Exam  (ICD-V70.0) 13)  Renal Insufficiency, Mild  (ICD-588.9) 14)  Anemia Nos  (ICD-285.9) 15)  Postnasal Drip  Syndrome, Allergic  (ICD-477.9) 16)  Spinal Stenosis, Lumbar  (ICD-724.02) 17)  Rupture, Rotator Cuff  (ICD-727.61) 18)  Cataracts, Bilateral  (ICD-366.9) 19)  Hemorrhoids  (ICD-455.6)  Medications Prior to Update: 1)  Lopressor 50 Mg  Tabs (Metoprolol Tartrate) .... Take 1 Tablet By Mouth Once A Day 2)  Tylenol 8 Hour 650 Mg  Cr-Tabs (Acetaminophen) .... Take 1 Tablet By Mouth Three Times A Day 3)  Gabapentin 300 Mg Caps (Gabapentin) .... Take 1-2 Capsules As Needed For Pain At Bedtime 4)  Eucerin Plus 5-5 % Lotn (Emollient) .... Apply Twice A Day in The Affected Skin Area. 5)  Remeron 15 Mg  Tabs (Mirtazapine) .... Take 1 Tab By Mouth At Bedtime 6)  Amitriptyline Hcl 25 Mg Tabs (Amitriptyline Hcl) .... Take 1 Tablet By Mouth Once A Day 7)  Ferrous Sulfate 325 (65 Fe) Mg Tabs (Ferrous Sulfate) .... Take 1 Tablet By Mouth Three Times A Day 8)  Miralax  Powd (Polyethylene Glycol 3350) .... Take 17 Gram of Miralax With 8 Ounces of Juice of Water Two Times A Day Until Bowel Movement Achieved, Then Once A Day. 9)  Furosemide 20 Mg Tabs (Furosemide) .... Take 1 Tablet By Mouth Once A Day in The Morning. 10)  Alavert 10 Mg Tabs (Loratadine) .... Take 1 Tablet By Mouth Once A Day  Current Medications (verified): 1)  Lopressor 50 Mg  Tabs (Metoprolol Tartrate) .... Take 1 Tablet By Mouth Once A Day 2)  Tylenol 8 Hour 650 Mg  Cr-Tabs (Acetaminophen) .... Take 1 Tablet By Mouth Three Times A Day 3)  Gabapentin 300 Mg Caps (Gabapentin) .... Take 1-2 Capsules As Needed For Pain At Bedtime 4)  Eucerin Plus 5-5 % Lotn (Emollient) .... Apply Twice A Day in The Affected Skin Area. 5)  Remeron 15 Mg Tabs (Mirtazapine) .... Take 1 Tab By Mouth At Bedtime 6)  Amitriptyline Hcl 25 Mg Tabs (Amitriptyline Hcl) .... Take 1 Tablet By Mouth Once A Day 7)  Ferrous Sulfate 325 (65 Fe) Mg Tabs (Ferrous Sulfate) .... Take 1 Tablet By Mouth Three Times A Day 8)  Miralax  Powd (Polyethylene Glycol 3350) .... Take 17 Gram of Miralax With 8 Ounces of Juice of Water Two Times A Day Until Bowel Movement Achieved, Then Once A Day. 9)  Furosemide 20 Mg Tabs (Furosemide) .... Take 1 Tablet By Mouth Once A Day in The Morning. 10)  Alavert 10 Mg Tabs (Loratadine) .... Take 1 Tablet By Mouth Once A Day  Allergies (verified): No Known Drug Allergies  Past History:  Past Medical History: Last updated: 05/15/2007 Headache Hypertension  Social History: Last updated: 02/03/2009 originally from [place].  Her father was a IT consultant trained at [employer].  She has been in the [place] since the 60's.   Sometimes goes back to visit family in [place].   Has one son here in [place]. Sister named Magdalene Patricia lives in [place].  Risk Factors: Alcohol Use: 0 (03/22/2010) Exercise: no (03/22/2010)  Risk Factors: Smoking Status: never (03/22/2010)  Review of Systems       As per HPI.  Physical Exam  General:  alert, well-developed, well-hydrated, cooperative to examination, and good hygiene.   Lungs:  Normal respiratory effort, chest expands symmetrically. Lungs are clear to auscultation, no crackles or wheezes. Heart:  Normal rate and regular rhythm. S1 and S2 normal without gallop, murmur, click, rub or other extra sounds. Abdomen:  soft, non-tender, positive BS and without distention.  Neurologic:  alert & oriented X3, cranial nerves II-XII intact; no deficit. Muscle strength was 4-5/5 bilaterally (poor effort).     Impression & Recommendations:  Problem # 1:  LEG EDEMA, BILATERAL (ICD-782.3) Resolved after the addition of lasix to her daily regimen. Renal function and electrolytes WNL. Will continue current regimen and will advise her to follow a low sodium diet.  Her updated medication list for this problem includes:    Furosemide 20 Mg Tabs (Furosemide) .Marland Kitchen... Take 1 tablet by mouth once a day in the morning.  Problem # 2:  DEPRESSION (ICD-311) Doing a lot better. Mood is not longer depressed appetite is back to normal and she is in good spirit. Will decreased her remeron to 7.5mg  at bedtime in order to start weaning her off meds. Will continue amitriptylene same dose for now.  Her updated medication list for this problem includes:    Remeron 15 Mg Tabs (Mirtazapine) .Marland Kitchen... Take 1/2 tab by mouth at bedtime    Amitriptyline Hcl 25 Mg Tabs (Amitriptyline hcl) .Marland Kitchen... Take 1 tablet by mouth once a day  Problem # 3:  RESTLESS LEG SYNDROME (ICD-333.94) Stable. will continue ferrous sulfate repletion and will also continue amitriptylene and gabapentin as directed.  Problem # 4:   HYPERTENSION (ICD-401.9)  Well controlled. No medication changes or dose adjustment needed at this time. Will advised her to follow a low sodium diet. renal functiona nd electrolytes WNL.  Her updated medication list for this problem includes:    Lopressor 50 Mg Tabs (Metoprolol tartrate) .Marland Kitchen... Take 1 tablet by mouth once a day    Furosemide 20 Mg Tabs (Furosemide) .Marland Kitchen... Take 1 tablet by mouth once a day in the morning.  Orders: T-CMP with Estimated GFR ([id number]) T-CBC w/Diff ([id number])  BP today: 115/77 Prior BP: 160/93 (03/22/2010)  Labs Reviewed: K+: 4.3 (03/22/2010) Creat: : 0.91 (03/22/2010)   Chol: 141 (12/25/2008)   HDL: 51 (12/25/2008)   LDL: 70 (12/25/2008)   TG: 98 (12/25/2008)  Problem # 5:  RENAL INSUFFICIENCY, MILD (ICD-588.9) Stable. Will continue current regimen and periodically checkup.  Problem # 6:  POSTNASAL DRIP SYNDROME, ALLERGIC (ICD-477.9) Patient instructed to use her loratadine as prescribed and advised to get prescription at [facility] (where the cost of medication prescribed was 4 dollars). She reports understanding and is planning to be compliant with her medications.  Her updated medication list for this problem includes:    Alavert 10 Mg Tabs (Loratadine) .Marland Kitchen... Take 1 tablet by mouth once a day  Problem # 7:  ANEMIA NOS (ICD-285.9) Hgb checked and stable. Ferritin level improved. Will continue therapy with ferrous sulfate as directed.  Her updated medication list for this problem includes:    Ferrous Sulfate 325 (65 Fe) Mg Tabs (Ferrous sulfate) .Marland Kitchen... Take 1 tablet by mouth three times a day  Orders: T-Ferritin ([id number]) T-CBC w/Diff ([id number])  Problem # 8:  SPINAL STENOSIS, LUMBAR (ICD-724.02) Chronic and currently bothering patient. She is not a candidate for surgical intervention. Will prescribed lidoderm patches, which help in the past with pain control.  Complete Medication List: 1)  Lopressor 50 Mg Tabs (Metoprolol tartrate)  .... Take 1 tablet by mouth once a day 2)  Tylenol 8 Hour 650 Mg Cr-tabs (Acetaminophen) .... Take 1 tablet by mouth three times a day 3)  Gabapentin 300 Mg Caps (Gabapentin) .... Take 1-2 capsules as needed for pain at bedtime 4)  Eucerin Plus 5-5 % Lotn (Emollient) .... Apply twice a day in the affected skin  area. 5)  Remeron 15 Mg Tabs (Mirtazapine) .... Take 1/2 tab by mouth at bedtime 6)  Amitriptyline Hcl 25 Mg Tabs (Amitriptyline hcl) .... Take 1 tablet by mouth once a day 7)  Ferrous Sulfate 325 (65 Fe) Mg Tabs (Ferrous sulfate) .... Take 1 tablet by mouth three times a day 8)  Miralax Powd (Polyethylene glycol 3350) .... Take 17 gram of miralax with 8 ounces of juice of water two times a day until bowel movement achieved, then once a day. 9)  Furosemide 20 Mg Tabs (Furosemide) .... Take 1 tablet by mouth once a day in the morning. 10)  Alavert 10 Mg Tabs (Loratadine) .... Take 1 tablet by mouth once a day 11)  Lidoderm 5 % Ptch (Lidocaine) .... Apply in your back for pain. each patch last 12 hrs; then 12 hours off.  Other Orders: Influenza Vaccine MCR ([id number])  Patient Instructions: 1)  Take your medications as prescribed. 2)  Follow a low sodium diet. 3)  For your back pain keep yourself acitve but avoid painful activities. Apply lidoderm patch as indicated. 4)  You will be called with any abnormalities in the tests scheduled or performed today.  If you don't hear from [place] within a week from when the test was performed, you can assume that your test was normal. 5)  Remeber to cut your remeron in half and to pick your Alavert[facility]Loratadine at [facility]. 6)  Please schedule a follow-up appointment in 3 months. Prescriptions: ALAVERT 10 MG TABS (LORATADINE) Take 1 tablet by mouth once a day  #31 x 5   Entered and Authorized by:   Vassie Loll MD   Signed by:   Vassie Loll MD on 06/09/2010   Method used:   Print then Give to Patient   RxID:   [id number] FUROSEMIDE 20 MG TABS  (FUROSEMIDE) Take 1 tablet by mouth once a day in the morning.  #31 x 3   Entered and Authorized by:   Vassie Loll MD   Signed by:   Vassie Loll MD on 06/09/2010   Method used:   [facility]        [facility]  [address] [phone]* (retail)       [address]       [place], [place]  [id number]       Ph: [phone] or [phone]       Fax: [phone]   RxID:   [id number] MIRALAX  POWD (POLYETHYLENE GLYCOL 3350) take 17 gram of miralax with 8 ounces of juice of water two times a day until bowel movement achieved, then once a day.  #1 bottle x 2   Entered and Authorized by:   Vassie Loll MD   Signed by:   Vassie Loll MD on 06/09/2010   Method used:   [facility] to        [facility]  [address] [phone]* (retail)       [address]       [place], [place]  [id number]       Ph: [phone] or [phone]       Fax: [phone]   RxID:   [id number] LIDODERM 5 % PTCH (LIDOCAINE) Apply in your back for pain. Each patch last 12 hrs; then 12 hours off.  #30 x 3   Entered and Authorized by:   Vassie Loll MD   Signed by:   Mikle Bosworth  Gwenlyn Perking MD on 06/09/2010   Method used:   Electronically to        [facility]  [address] [phone]* (retail)       [address]       [place], [place]  [id number]       Ph: [phone] or [phone]       Fax: [phone]   RxID:   [id number] REMERON 15 MG TABS (MIRTAZAPINE) Take 1/2 tab by mouth at bedtime  #30 x 2   Entered and Authorized by:   Vassie Loll MD   Signed by:   Vassie Loll MD on 06/09/2010   Method used:   Electronically to        [facility]  [address] [phone]* (retail)       [address]       [place], [place]  [address]       Ph: [phone] or [phone]       Fax: [phone]   RxID:   [id number]  Process Orders Check Orders Results:     [facility]: Check  successful Tests Sent for requisitioning (June 10, 2010 9:34 PM):     06/09/2010: [facility] -- T-CMP with Estimated GFR [[id number]] (signed)     06/09/2010: [facility] -- T-Ferritin [phone] (signed)     06/09/2010: [facility] -- [facility] w/Diff [[id number]] (signed)     Prevention & Chronic Care Immunizations   Influenza vaccine: Fluvax MCR  (06/09/2010)   Influenza vaccine deferral: Deferred  (06/17/2009)    Tetanus booster: 06/18/2009: Td    Pneumococcal vaccine: Not documented   Pneumococcal vaccine deferral: Deferred  (06/17/2009)    H. zoster vaccine: Not documented   H. zoster vaccine deferral: Deferred  (06/17/2009)  Colorectal Screening   Hemoccult: Not documented   Hemoccult action/deferral: Ordered  (06/17/2009)    Colonoscopy: Not documented   Colonoscopy action/deferral: Deferred  (06/17/2009)  Other Screening   Pap smear: Not documented   Pap smear action/deferral: Not indicated S/P hysterectomy  (06/17/2009)    Mammogram: No specific mammographic evidence of malignancy.    (01/19/2009)   Mammogram action/deferral: Not indicated  (06/09/2010)   Mammogram due: 01/2010    DXA bone density scan: Not documented   DXA bone density action/deferral: Ordered  (06/17/2009)   Smoking status: never  (03/22/2010)  Lipids   Total Cholesterol: 141  (12/25/2008)   LDL: 70  (12/25/2008)   LDL Direct: Not documented   HDL: 51  (12/25/2008)   Triglycerides: 98  (12/25/2008)  Hypertension   Last Blood Pressure: 115 / 77  (06/09/2010)   Serum creatinine: 0.91  (03/22/2010)   BMP action: Ordered   Serum potassium 4.3  (03/22/2010)  Self-Management Support :   Personal Goals (by the next clinic visit) :      Personal blood pressure goal: 140/90  (06/17/2009)   Patient will work on the following items until the next clinic visit to reach self-care goals:     Medications and monitoring: take my medicines  every day  (06/09/2010)     Eating: drink diet soda or water instead of juice or soda, eat more vegetables, use fresh or frozen vegetables, eat foods that are low in salt, eat baked foods instead of fried foods, eat fruit for snacks and desserts, limit or avoid alcohol  (03/22/2010)     Activity: take a 30  minute walk every day  (02/03/2010)    Hypertension self-management support: Written self-care plan, Education handout, Pre-printed educational material  (02/03/2010)   Nursing Instructions: Give Flu vaccine today     Immunizations Administered:  Influenza Vaccine # 1:    Vaccine Type: Fluvax MCR    Site: left deltoid    Mfr: GlaxoSmithKline    Dose: 0.5 ml    Route: IM    Given by: Cynda Familia ([facility])    Exp. Date: 04/09/2011    Lot #: [id number]    VIS given: 05/03/07 version given June 09, 2010.  Flu Vaccine Consent Questions:    Do you have a history of severe allergic reactions to this vaccine? no    Any prior history of allergic reactions to egg and/or gelatin? no    Do you have a sensitivity to the preservative Thimersol? no    Do you have a past history of Guillan-Barre Syndrome? no    Do you currently have an acute febrile illness? no    Have you ever had a severe reaction to latex? no    Vaccine information given and explained to patient? yes    Are you currently pregnant? no

## 2010-11-11 NOTE — Miscellaneous (Signed)
Summary: [facility]   Imported By: Margie Billet 01/15/2010 11:41:58  _____________________________________________________________________  External Attachment:    Type:   Image     Comment:   External Document

## 2010-11-11 NOTE — Assessment & Plan Note (Signed)
Summary: EST-CK/FU/MEDS/CFB   Vital Signs:  Patient profile:   77 year old female Height:      64 inches (162.56 cm) Weight:      144.1 pounds (64.10 kg) BMI:     24.29 Temp:     97.1 degrees F (36.17 degrees C) oral Pulse rate:   76 / minute BP sitting:   160 / 93  (right arm) Cuff size:   REGUALR  Vitals Entered By: Theotis Barrio NT II (March 22, 2010 4:27 PM) CC: MEDICATION   /  SEVERE BACK PAIN # 10  - SHARP   / BILATERAL SWELLING OF FEET Is Patient Diabetic? No Pain Assessment Patient in pain? yes     Location: back Intensity:     10 Type: sharp Nutritional Status BMI of 19 -24 = normal  Have you ever been in a relationship where you felt threatened, hurt or afraid?No   Does patient need assistance? Functional Status Self care Ambulation Normal   Primary Care Provider:  Vassie Loll MD  CC:  MEDICATION   /  SEVERE BACK PAIN # 10  - SHARP   / BILATERAL SWELLING OF FEET.  History of Present Illness: 77 y/o female with pmh as described on the EMR. Who comes tot he clinic for followup on her leg swelling, anemia, HTN and restless leg syndrome.  Patient is feeling great today, to the point that she said she doesn't need any further help at home. She is having 1 plus edema bilaterally and just mild discomfort on her legs at night. Mood and energy continue to be more improved. BP is elevated today and it looks that she will benefit of lasix to control BP and help with leg swelling.  Except for mild constipation due to the use of ferrous sulfate, patient denies any abdominal pain, melena, hematochezia, dizziness, Ha, SOB or CP. Patient is complaining of mild hoarseness and mild sore throat especially at night and early morning when she woke up.   She continue using her walker for ambulation w/o any difficulties.  BP today 160/93.  Preventive Screening-Counseling & Management  Alcohol-Tobacco     Alcohol drinks/day: 0     Smoking Status:  never  Caffeine-Diet-Exercise     Does Patient Exercise: no  Problems Prior to Update: 1)  Leg Edema, Bilateral  (ICD-782.3) 2)  Dry Skin  (ICD-701.1) 3)  Personal History of Other Specified Diseases  (ICD-V13.8) 4)  Depression  (ICD-311) 5)  Hemoccult Positive Stool  (ICD-578.1) 6)  Leukocytosis  (ICD-288.60) 7)  Transaminases, Serum, Elevated  (ICD-790.4) 8)  Restless Leg Syndrome  (ICD-333.94) 9)  Hip Pain, Left  (ICD-719.45) 10)  Neck Pain  (ICD-723.1) 11)  Hypertension  (ICD-401.9) 12)  Health Maintenance Exam  (ICD-V70.0) 13)  Renal Insufficiency, Mild  (ICD-588.9) 14)  Anemia Nos  (ICD-285.9) 15)  Postnasal Drip Syndrome, Allergic  (ICD-477.9) 16)  Spinal Stenosis, Lumbar  (ICD-724.02) 17)  Rupture, Rotator Cuff  (ICD-727.61) 18)  Cataracts, Bilateral  (ICD-366.9) 19)  Hemorrhoids  (ICD-455.6)  Current Problems (verified): 1)  Leg Edema, Bilateral  (ICD-782.3) 2)  Dry Skin  (ICD-701.1) 3)  Personal History of Other Specified Diseases  (ICD-V13.8) 4)  Depression  (ICD-311) 5)  Hemoccult Positive Stool  (ICD-578.1) 6)  Leukocytosis  (ICD-288.60) 7)  Transaminases, Serum, Elevated  (ICD-790.4) 8)  Restless Leg Syndrome  (ICD-333.94) 9)  Hip Pain, Left  (ICD-719.45) 10)  Neck Pain  (ICD-723.1) 11)  Hypertension  (ICD-401.9) 12)  Health Maintenance Exam  (  ICD-V70.0) 13)  Renal Insufficiency, Mild  (ICD-588.9) 14)  Anemia Nos  (ICD-285.9) 15)  Postnasal Drip Syndrome, Allergic  (ICD-477.9) 16)  Spinal Stenosis, Lumbar  (ICD-724.02) 17)  Rupture, Rotator Cuff  (ICD-727.61) 18)  Cataracts, Bilateral  (ICD-366.9) 19)  Hemorrhoids  (ICD-455.6)  Medications Prior to Update: 1)  Lopressor 50 Mg  Tabs (Metoprolol Tartrate) .... Take 1 Tablet By Mouth Once A Day 2)  Tylenol 8 Hour 650 Mg  Cr-Tabs (Acetaminophen) .... Take 1 Tablet By Mouth Three Times A Day 3)  Gabapentin 300 Mg Caps (Gabapentin) .... Take 1-2 Capsules As Needed For Pain At Bedtime 4)  Eucerin Plus 5-5 %  Lotn (Emollient) .... Apply Twice A Day in The Affected Skin Area. 5)  Remeron 15 Mg Tabs (Mirtazapine) .... Take 1 Tab By Mouth At Bedtime 6)  Amitriptyline Hcl 25 Mg Tabs (Amitriptyline Hcl) .... Take 1 Tablet By Mouth Once A Day 7)  Ferrous Sulfate 325 (65 Fe) Mg Tabs (Ferrous Sulfate) .... Take 1 Tablet By Mouth Three Times A Day 8)  Miralax  Powd (Polyethylene Glycol 3350) .... Take 17 Gram of Miralax With 8 Ounces of Juice of Water Two Times A Day Until Bowel Movement Achieved, Then Once A Day.  Current Medications (verified): 1)  Lopressor 50 Mg  Tabs (Metoprolol Tartrate) .... Take 1 Tablet By Mouth Once A Day 2)  Tylenol 8 Hour 650 Mg  Cr-Tabs (Acetaminophen) .... Take 1 Tablet By Mouth Three Times A Day 3)  Gabapentin 300 Mg Caps (Gabapentin) .... Take 1-2 Capsules As Needed For Pain At Bedtime 4)  Eucerin Plus 5-5 % Lotn (Emollient) .... Apply Twice A Day in The Affected Skin Area. 5)  Remeron 15 Mg Tabs (Mirtazapine) .... Take 1 Tab By Mouth At Bedtime 6)  Amitriptyline Hcl 25 Mg Tabs (Amitriptyline Hcl) .... Take 1 Tablet By Mouth Once A Day 7)  Ferrous Sulfate 325 (65 Fe) Mg Tabs (Ferrous Sulfate) .... Take 1 Tablet By Mouth Three Times A Day 8)  Miralax  Powd (Polyethylene Glycol 3350) .... Take 17 Gram of Miralax With 8 Ounces of Juice of Water Two Times A Day Until Bowel Movement Achieved, Then Once A Day.  Allergies (verified): No Known Drug Allergies  Past History:  Past Medical History: Last updated: 05/15/2007 Headache Hypertension  Social History: Last updated: 02/03/2009 originally from [place].  Her father was a IT consultant trained at [employer].  She has been in the [place] since the 60's.  Sometimes goes back to visit family in [place].   Has one son here in [place]. Sister named Magdalene Patricia lives in [place].  Risk Factors: Alcohol Use: 0 (03/22/2010) Exercise: no (03/22/2010)  Risk Factors: Smoking Status: never (03/22/2010)  Review of  Systems       As per HPI.  Physical Exam  General:  alert, well-developed, well-hydrated, cooperative to examination, and good hygiene.   Lungs:  Normal respiratory effort, chest expands symmetrically. Lungs are clear to auscultation, no crackles or wheezes. Heart:  Normal rate and regular rhythm. S1 and S2 normal without gallop, murmur, click, rub or other extra sounds. Abdomen:  soft, non-tender, positive BS and without distention.   Extremities:  1 plus edema bilaterally, no cyanosis. Neurologic:  alert & oriented X3, cranial nerves II-XII intact; no deficit. Muscle strength was 4-5/5 bilaterally.     Impression & Recommendations:  Problem # 1:  LEG EDEMA, BILATERAL (ICD-782.3) Patient now having again up to 1 plus edema bilaterally. will  start low daily dose of lasix which would also help controlling her BP and will recommend to follow a low sodium diet; patient also advise to keep leg elevated as much as possible.  Her updated medication list for this problem includes:    Furosemide 20 Mg Tabs (Furosemide) .Marland Kitchen... Take 1 tablet by mouth once a day in the morning.  Problem # 2:  HYPERTENSION (ICD-401.9) currently not at goal. will add lasix daily to help controlling BP and also to help with Le edema (without causing to much electrolytes abnormalities). I have checked renal function and is normal.  Her updated medication list for this problem includes:    Lopressor 50 Mg Tabs (Metoprolol tartrate) .Marland Kitchen... Take 1 tablet by mouth once a day    Furosemide 20 Mg Tabs (Furosemide) .Marland Kitchen... Take 1 tablet by mouth once a day in the morning.  Orders: T-Comprehensive Metabolic Panel ([id number])  Problem # 3:  DEPRESSION (ICD-311) Stable. Mood steadily improving; will continue current medication regimen.  Her updated medication list for this problem includes:    Remeron 15 Mg Tabs (Mirtazapine) .Marland Kitchen... Take 1 tab by mouth at bedtime    Amitriptyline Hcl 25 Mg Tabs (Amitriptyline hcl) .Marland Kitchen...  Take 1 tablet by mouth once a day  Problem # 4:  ANEMIA NOS (ICD-285.9) Hgb 1.4 now. will continue iron repletion and will monitorize her hgb on regular basis. Patient will continue high fiber diet and good fluid intake to counter act constipation from iron.  Her updated medication list for this problem includes:    Ferrous Sulfate 325 (65 Fe) Mg Tabs (Ferrous sulfate) .Marland Kitchen... Take 1 tablet by mouth three times a day  Orders: T-CBC w/Diff ([id number])  Problem # 5:  POSTNASAL DRIP SYNDROME, ALLERGIC (ICD-477.9) Will start alavert by mouth daily. will follow on her sore throat and hoarseness complaints. No erythema or exudate appreciated during physical exam.  Her updated medication list for this problem includes:    Alavert 10 Mg Tabs (Loratadine) .Marland Kitchen... Take 1 tablet by mouth once a day  Problem # 6:  RESTLESS LEG SYNDROME (ICD-333.94) Continue iron supplementation therapy and also amitriptylene. Patient symptoms are much better.  Complete Medication List: 1)  Lopressor 50 Mg Tabs (Metoprolol tartrate) .... Take 1 tablet by mouth once a day 2)  Tylenol 8 Hour 650 Mg Cr-tabs (Acetaminophen) .... Take 1 tablet by mouth three times a day 3)  Gabapentin 300 Mg Caps (Gabapentin) .... Take 1-2 capsules as needed for pain at bedtime 4)  Eucerin Plus 5-5 % Lotn (Emollient) .... Apply twice a day in the affected skin area. 5)  Remeron 15 Mg Tabs (Mirtazapine) .... Take 1 tab by mouth at bedtime 6)  Amitriptyline Hcl 25 Mg Tabs (Amitriptyline hcl) .... Take 1 tablet by mouth once a day 7)  Ferrous Sulfate 325 (65 Fe) Mg Tabs (Ferrous sulfate) .... Take 1 tablet by mouth three times a day 8)  Miralax Powd (Polyethylene glycol 3350) .... Take 17 gram of miralax with 8 ounces of juice of water two times a day until bowel movement achieved, then once a day. 9)  Furosemide 20 Mg Tabs (Furosemide) .... Take 1 tablet by mouth once a day in the morning. 10)  Alavert 10 Mg Tabs (Loratadine) .... Take 1  tablet by mouth once a day  Patient Instructions: 1)  Take your medications as prescribed. 2)  Please schedule a follow-up appointment in 3 month. 3)  Keep yourself hydrated and increased fiber ingestion on your diet.  4)  Follow a low sodium diet. 5)  Keep yourself active and follow physical therapy instrucions. 6)  You will be called with any abnormalities in the tests scheduled or performed today.  If you don't hear from [place] within a week from when the test was performed, you can assume that your test was normal. Prescriptions: ALAVERT 10 MG TABS (LORATADINE) Take 1 tablet by mouth once a day  #31 x 5   Entered and Authorized by:   Vassie Loll MD   Signed by:   Vassie Loll MD on 03/22/2010   Method used:   Electronically to        [facility] [address] [phone]* (retail)       [address]       [place], [place]  [id number]       Ph: [phone] or [phone]       Fax: [phone]   RxID:   [id number] FUROSEMIDE 20 MG TABS (FUROSEMIDE) Take 1 tablet by mouth once a day in the morning.  #31 x 3   Entered and Authorized by:   Vassie Loll MD   Signed by:   Vassie Loll MD on 03/22/2010   Method used:   [facility]        [facility] [address] [phone]* (retail)       [address]       [place], [place]  [id number]       Ph: [phone] or [phone]       Fax: [phone]   RxID:   [id number]  Process Orders Check Orders Results:     [facility]: Check successful Tests Sent for requisitioning (March 23, 2010 7:49 AM):     03/22/2010: [facility] -- T-CBC w/Diff [[id number]] (signed)     03/22/2010: [facility] -- T-Comprehensive Metabolic Panel [phone] (signed)    Prevention & Chronic Care Immunizations   Influenza vaccine: Fluvax MCR  (08/16/2007)   Influenza vaccine deferral: Deferred  (06/17/2009)    Tetanus booster: 06/18/2009: Td     Pneumococcal vaccine: Not documented   Pneumococcal vaccine deferral: Deferred  (06/17/2009)    H. zoster vaccine: Not documented   H. zoster vaccine deferral: Deferred  (06/17/2009)  Colorectal Screening   Hemoccult: Not documented   Hemoccult action/deferral: Ordered  (06/17/2009)    Colonoscopy: Not documented   Colonoscopy action/deferral: Deferred  (06/17/2009)  Other Screening   Pap smear: Not documented   Pap smear action/deferral: Not indicated S/P hysterectomy  (06/17/2009)    Mammogram: No specific mammographic evidence of malignancy.    (01/19/2009)   Mammogram due: 01/2010    DXA bone density scan: Not documented   DXA bone density action/deferral: Ordered  (06/17/2009)   Smoking status: never  (03/22/2010)  Lipids   Total Cholesterol: 141  (12/25/2008)   LDL: 70  (12/25/2008)   LDL Direct: Not documented   HDL: 51  (12/25/2008)   Triglycerides: 98  (12/25/2008)  Hypertension   Last Blood Pressure: 160 / 93  (03/22/2010)   Serum creatinine: 0.96  (02/03/2010)   BMP action: Ordered   Serum potassium 4.2  (02/03/2010) CMP ordered   Self-Management Support :   Personal Goals (by the next clinic visit) :      Personal blood pressure goal: 140/90  (06/17/2009)   Patient will work on the following items until the next clinic  visit to reach self-care goals:     Medications and monitoring: take my medicines every day, bring all of my medications to every visit  (03/22/2010)     Eating: drink diet soda or water instead of juice or soda, eat more vegetables, use fresh or frozen vegetables, eat foods that are low in salt, eat baked foods instead of fried foods, eat fruit for snacks and desserts, limit or avoid alcohol  (03/22/2010)     Activity: take a 30 minute walk every day  (02/03/2010)    Hypertension self-management support: Written self-care plan, Education handout, Pre-printed educational material  (02/03/2010)    Self-management comments: PT

## 2010-11-11 NOTE — Assessment & Plan Note (Signed)
Summary: EST-3 MONTH ROUTINE CHECKUP/CH   Vital Signs:  Patient profile:   77 year old female Height:      64 inches (162.56 cm) Weight:      154.1 pounds (70.05 kg) BMI:     26.55 Temp:     97.4 degrees F (36.33 degrees C) oral Pulse rate:   88 / minute BP sitting:   126 / 78  (left arm) Cuff size:   regular  Vitals Entered By: Cynda Familia Duncan Dull) (September 22, 2010 4:22 PM) CC: routine f/u, med on furosemide, remeron Is Patient Diabetic? No Pain Assessment Patient in pain? yes     Location: back Intensity: 9 Type: sharp Onset of pain  Chronic Nutritional Status BMI of 25 - 29 = overweight  Have you ever been in a relationship where you felt threatened, hurt or afraid?No   Does patient need assistance? Functional Status Self care Ambulation Impaired:Risk for fall Comments uses a walker   Primary Care Provider:  Vassie Loll MD  CC:  routine f/u, med on furosemide, and remeron.  History of Present Illness: 77 y/o female with pmh as describe din the EMR; who comes tot he clinic for followup and medication refill.   Patient is currently doing ok, except for her back which has continue bothering her, especially now with the cold weather. Patient reports that the back pain is relieved by tylenol and lidoderm patch, but she doesn't use  it all the time, denies any bladder incontinence/retention or any BM's discomfort; she also denies any numbness going down her legs and continue to be able to ambulate w/o to much difficulty even she is having some pain.  Patient described no blood in her stool, increased appetite, good mood and spirit today.  She is compliant with her medications, and the only other thing bothering her is right shoulder pain, especially with movements. She reports it has been going on for the last 4-6 weeks and is now worsen (meaning by this increase in the pain intensity).   Preventive Screening-Counseling & Management  Alcohol-Tobacco     Alcohol  drinks/day: 0     Smoking Status: never  Problems Prior to Update: 1)  Shoulder Pain, Right  (ICD-719.41) 2)  Leg Edema, Bilateral  (ICD-782.3) 3)  Dry Skin  (ICD-701.1) 4)  Personal History of Other Specified Diseases  (ICD-V13.8) 5)  Depression  (ICD-311) 6)  Hemoccult Positive Stool  (ICD-578.1) 7)  Leukocytosis  (ICD-288.60) 8)  Transaminases, Serum, Elevated  (ICD-790.4) 9)  Restless Leg Syndrome  (ICD-333.94) 10)  Hip Pain, Left  (ICD-719.45) 11)  Neck Pain  (ICD-723.1) 12)  Hypertension  (ICD-401.9) 13)  Health Maintenance Exam  (ICD-V70.0) 14)  Renal Insufficiency, Mild  (ICD-588.9) 15)  Anemia Nos  (ICD-285.9) 16)  Postnasal Drip Syndrome, Allergic  (ICD-477.9) 17)  Spinal Stenosis, Lumbar  (ICD-724.02) 18)  Rupture, Rotator Cuff  (ICD-727.61) 19)  Cataracts, Bilateral  (ICD-366.9) 20)  Hemorrhoids  (ICD-455.6)  Current Problems (verified): 1)  Shoulder Pain, Right  (ICD-719.41) 2)  Leg Edema, Bilateral  (ICD-782.3) 3)  Dry Skin  (ICD-701.1) 4)  Personal History of Other Specified Diseases  (ICD-V13.8) 5)  Depression  (ICD-311) 6)  Hemoccult Positive Stool  (ICD-578.1) 7)  Leukocytosis  (ICD-288.60) 8)  Transaminases, Serum, Elevated  (ICD-790.4) 9)  Restless Leg Syndrome  (ICD-333.94) 10)  Hip Pain, Left  (ICD-719.45) 11)  Neck Pain  (ICD-723.1) 12)  Hypertension  (ICD-401.9) 13)  Health Maintenance Exam  (ICD-V70.0) 14)  Renal Insufficiency,  Mild  (ICD-588.9) 15)  Anemia Nos  (ICD-285.9) 16)  Postnasal Drip Syndrome, Allergic  (ICD-477.9) 17)  Spinal Stenosis, Lumbar  (ICD-724.02) 18)  Rupture, Rotator Cuff  (ICD-727.61) 19)  Cataracts, Bilateral  (ICD-366.9) 20)  Hemorrhoids  (ICD-455.6)  Medications Prior to Update: 1)  Lopressor 50 Mg  Tabs (Metoprolol Tartrate) .... Take 1 Tablet By Mouth Once A Day 2)  Tylenol 8 Hour 650 Mg  Cr-Tabs (Acetaminophen) .... Take 1 Tablet By Mouth Three Times A Day 3)  Gabapentin 300 Mg Caps (Gabapentin) .... Take 1-2  Capsules As Needed For Pain At Bedtime 4)  Eucerin Plus 5-5 % Lotn (Emollient) .... Apply Twice A Day in The Affected Skin Area. 5)  Remeron 15 Mg Tabs (Mirtazapine) .... Take 1/2 Tab By Mouth At Bedtime 6)  Amitriptyline Hcl 25 Mg Tabs (Amitriptyline Hcl) .... Take 1 Tablet By Mouth Once A Day 7)  Ferrous Sulfate 325 (65 Fe) Mg Tabs (Ferrous Sulfate) .... Take 1 Tablet By Mouth Three Times A Day 8)  Miralax  Powd (Polyethylene Glycol 3350) .... Take 17 Gram of Miralax With 8 Ounces of Juice of Water Two Times A Day Until Bowel Movement Achieved, Then Once A Day. 9)  Furosemide 20 Mg Tabs (Furosemide) .... Take 1 Tablet By Mouth Once A Day in The Morning. 10)  Alavert 10 Mg Tabs (Loratadine) .... Take 1 Tablet By Mouth Once A Day 11)  Lidoderm 5 % Ptch (Lidocaine) .... Apply in Your Back For Pain. Each Patch Last 12 Hrs; Then 12 Hours Off.  Allergies (verified): No Known Drug Allergies  Past History:  Past Medical History: Last updated: 05/15/2007 Headache Hypertension  Social History: Last updated: 02/03/2009 originally from [place].  Her father was a IT consultant trained at [employer].  She has been in the [place] since the 60's.  Sometimes goes back to visit family in [place].   Has one son here in [place]. Sister named Magdalene Patricia lives in [place].  Risk Factors: Alcohol Use: 0 (09/22/2010) Exercise: no (03/22/2010)  Risk Factors: Smoking Status: never (09/22/2010)  Review of Systems       As per HPI.  Physical Exam  General:  alert, well-nourished, well-hydrated, cooperative to examination, and good hygiene.   Lungs:  Normal respiratory effort, chest expands symmetrically. Lungs are clear to auscultation, no crackles or wheezes. Heart:  Normal rate and regular rhythm. S1 and S2 normal without gallop, murmur, click, rub or other extra sounds. Abdomen:  soft, non-tender, positive BS and without distention.   Msk:  Mild tenderness to palpation on her mid and lower  spine, no radiation and with mild decreased range of motion (regarding flexion and extension of her hips). Also with right shoulder decrease of motion, pain with manouvers, but with good grip and normal sensation. Extremities:  trace edema bilaterally, good pulses and no cyanosis.  Neurologic:  alert & oriented X3, cranial nerves II-XII intact, and sensation intact to light touch.  Patient is using a walker for ambulation assistance.   Impression & Recommendations:  Problem # 1:  SHOULDER PAIN, RIGHT (ICD-719.41) Patient pain most likely due to bursitis vs mild rotator cuff syndrome vs OA. Will start empirically treatment with diclofenac for 15 days and if pain persist or worsen, will get X-ray to r/o other possibilities and will give a cortisone injection in her shoulder.  Her updated medication list for this problem includes:    Tylenol 8 Hour 650 Mg Cr-tabs (Acetaminophen) .Marland Kitchen... Take 1 tablet  by mouth three times a day    Diclofenac Sodium 75 Mg Tbec (Diclofenac sodium) .Marland Kitchen... Take 1 tablet by mouth two times a day  Problem # 2:  DEPRESSION (ICD-311) Mood, spirit and appetite are well controlled and stable. Will continue remeron. No SI or hallucinations present at this time.  Her updated medication list for this problem includes:    Remeron 15 Mg Tabs (Mirtazapine) .Marland Kitchen... Take 1/2 tab by mouth at bedtime    Amitriptyline Hcl 25 Mg Tabs (Amitriptyline hcl) .Marland Kitchen... Take 1 tablet by mouth once a day  Problem # 3:  HYPERTENSION (ICD-401.9) Well controlled. Labwork demonstrated normal renal function and electrolytes. Will continue current regimen.  Her updated medication list for this problem includes:    Lopressor 50 Mg Tabs (Metoprolol tartrate) .Marland Kitchen... Take 1 tablet by mouth once a day    Furosemide 20 Mg Tabs (Furosemide) .Marland Kitchen... Take 1 tablet by mouth once a day in the morning.  Orders: T-CBC No Diff ([id number]) T-Basic Metabolic Panel ([id number])  Problem # 4:  ANEMIA NOS  (ICD-285.9) Hgb 9.9; denies any blood in her stool. Will continue ferrous sulfate three times a day.  Her updated medication list for this problem includes:    Ferrous Sulfate 325 (65 Fe) Mg Tabs (Ferrous sulfate) .Marland Kitchen... Take 1 tablet by mouth three times a day  Problem # 5:  POSTNASAL DRIP SYNDROME, ALLERGIC (ICD-477.9) Will continue treatment with antihistamines. Patient reprots improvements on her symptoms.  Her updated medication list for this problem includes:    Alavert 10 Mg Tabs (Loratadine) .Marland Kitchen... Take 1 tablet by mouth once a day  Problem # 6:  SPINAL STENOSIS, LUMBAR (ICD-724.02) Responding to the use of tylenol and lidoderm patches. Will restart her neurontin for better pain control and will encourage her to use her lidoderm patch when in pain. Will refills her medications.  Problem # 7:  RESTLESS LEG SYNDROME (ICD-333.94) Significantly improved. Will continue iron repletion and also tx with amitriptylene.  Complete Medication List: 1)  Lopressor 50 Mg Tabs (Metoprolol tartrate) .... Take 1 tablet by mouth once a day 2)  Tylenol 8 Hour 650 Mg Cr-tabs (Acetaminophen) .... Take 1 tablet by mouth three times a day 3)  Gabapentin 300 Mg Caps (Gabapentin) .... Take 1 tab by moutn 3 times a day 4)  Eucerin Plus 5-5 % Lotn (Emollient) .... Apply twice a day in the affected skin area. 5)  Remeron 15 Mg Tabs (Mirtazapine) .... Take 1/2 tab by mouth at bedtime 6)  Amitriptyline Hcl 25 Mg Tabs (Amitriptyline hcl) .... Take 1 tablet by mouth once a day 7)  Ferrous Sulfate 325 (65 Fe) Mg Tabs (Ferrous sulfate) .... Take 1 tablet by mouth three times a day 8)  Miralax Powd (Polyethylene glycol 3350) .... Take 17 gram of miralax with 8 ounces of juice of water two times a day until bowel movement achieved, then once a day. 9)  Furosemide 20 Mg Tabs (Furosemide) .... Take 1 tablet by mouth once a day in the morning. 10)  Alavert 10 Mg Tabs (Loratadine) .... Take 1 tablet by mouth once a  day 11)  Lidoderm 5 % Ptch (Lidocaine) .... Apply in your back for pain. each patch last 12 hrs; then 12 hours off. 12)  Diclofenac Sodium 75 Mg Tbec (Diclofenac sodium) .... Take 1 tablet by mouth two times a day  Patient Instructions: 1)  Take your medications as prescribed. 2)  Followup appointment in 3 weeks (sooner if your shoulder pain  worsen). 3)  You will be called with any abnormalities in the tests scheduled or performed today.  If you don't hear from [place] within a week from when the test was performed, you can assume that your test was normal. 4)  Remember to take your iron pills three times a day. 5)  Use your lidoderm as prescribed for your back pain. Prescriptions: AMITRIPTYLINE HCL 25 MG TABS (AMITRIPTYLINE HCL) Take 1 tablet by mouth once a day  #90 x 2   Entered and Authorized by:   Vassie Loll MD   Signed by:   Vassie Loll MD on 09/22/2010   Method used:   [facility]        [facility] [address] [phone]* (retail)       [address]       [place], [place]  [address]       Ph: [phone] or [phone]       Fax: [phone]   RxID:   [id number] GABAPENTIN 300 MG CAPS (GABAPENTIN) Take 1 tab by moutn 3 times a day  #90 x 2   Entered and Authorized by:   Vassie Loll MD   Signed by:   Vassie Loll MD on 09/22/2010   Method used:   [facility]        [facility] [address] [phone]* (retail)       [address]       [place], [place]  [address]       Ph: [phone] or [phone]       Fax: [phone]   RxID:   [id number] LIDODERM 5 % PTCH (LIDOCAINE) Apply in your back for pain. Each patch last 12 hrs; then 12 hours off.  #30 x 3   Entered and Authorized by:   Vassie Loll MD   Signed by:   Vassie Loll MD on 09/22/2010   Method used:   [facility]        [facility] [address] [phone]* (retail)       [address]       [place], [place]   [id number]       Ph: [phone] or [phone]       Fax: [phone]   RxID:   [id number] FUROSEMIDE 20 MG TABS (FUROSEMIDE) Take 1 tablet by mouth once a day in the morning.  #31 x 3   Entered and Authorized by:   Vassie Loll MD   Signed by:   Vassie Loll MD on 09/22/2010   Method used:   [facility]ronically to        [facility] [address] [phone]* (retail)       [address]       [place], [place]  [address]       Ph: [phone] or [phone]       Fax: [phone]   RxID:   [id number] FERROUS SULFATE 325 (65 FE) MG TABS (FERROUS SULFATE) Take 1 tablet by mouth three times a day  #90 x 5   Entered and Authorized by:   Vassie Loll MD   Signed by:   Vassie Loll MD on 09/22/2010   Method used:   [facility]ronically to        [facility] [address] [phone]* (retail)  [address]       [place], [place]  [address]       Ph: [phone] or [phone]       Fax: [phone]   RxID:   [id number] REMERON 15 MG TABS (MIRTAZAPINE) Take 1/2 tab by mouth at bedtime  #30 x 2   Entered and Authorized by:   Vassie Loll MD   Signed by:   Vassie Loll MD on 09/22/2010   Method used:   [facility]ronically to        [facility] [address] [phone]* (retail)       [address]       [place], [place]  [address]       Ph: [phone] or [phone]       Fax: [phone]   RxID:   [id number] LOPRESSOR 50 MG  TABS (METOPROLOL TARTRATE) Take 1 tablet by mouth once a day  #90 x 3   Entered and Authorized by:   Vassie Loll MD   Signed by:   Vassie Loll MD on 09/22/2010   Method used:   Electronically to        [facility] [address] [phone]* (retail)       [address]       [place], [place]  [address]       Ph: [phone] or [phone]       Fax: [phone]   RxID:   [id number] DICLOFENAC SODIUM 75 MG TBEC (DICLOFENAC SODIUM) Take 1 tablet by mouth  two times a day  #30 x 0   Entered and Authorized by:   Vassie Loll MD   Signed by:   Vassie Loll MD on 09/22/2010   Method used:   Electronically to        [facility] [address] [phone]* (retail)       [address]       [place], [place]  [address]       Ph: [phone] or [phone]       Fax: [phone]   RxID:   [id number]    Orders Added: 1)  Est. Patient Level III [76734] 2)  T-CBC No Diff [[id number]] 3)  T-Basic Metabolic Panel [[id number]]   Process Orders Check Orders Results:     [facility]: Check successful Tests Sent for requisitioning (September 23, 2010 7:10 AM):     09/22/2010: [facility] -- T-CBC No Diff [[id number]] (signed)     09/22/2010: [facility] -- T-Basic Metabolic Panel [phone] (signed)     Prevention & Chronic Care Immunizations   Influenza vaccine: Fluvax MCR  (06/09/2010)   Influenza vaccine deferral: Deferred  (06/17/2009)    Tetanus booster: 06/18/2009: Td    Pneumococcal vaccine: Not documented   Pneumococcal vaccine deferral: Deferred  (06/17/2009)    H. zoster vaccine: Not documented   H. zoster vaccine deferral: Deferred  (06/17/2009)  Colorectal Screening   Hemoccult: Not documented   Hemoccult action/deferral: Ordered  (06/17/2009)    Colonoscopy: Not documented   Colonoscopy action/deferral: Deferred  (06/17/2009)  Other Screening   Pap smear: Not documented   Pap smear action/deferral: Not indicated S/P hysterectomy  (06/17/2009)    Mammogram: No specific mammographic evidence of malignancy.    (01/19/2009)   Mammogram action/deferral: Not indicated  (  06/09/2010)   Mammogram due: 01/2010    DXA bone density scan: Not documented   DXA bone density action/deferral: Ordered  (06/17/2009)   Smoking status: never  (09/22/2010)  Lipids   Total Cholesterol: 141  (12/25/2008)   LDL: 70  (12/25/2008)   LDL Direct: Not  documented   HDL: 51  (12/25/2008)   Triglycerides: 98  (12/25/2008)  Hypertension   Last Blood Pressure: 126 / 78  (09/22/2010)   Serum creatinine: 1.01  (06/09/2010)   BMP action: Ordered   Serum potassium 4.3  (06/09/2010)  Self-Management Support :   Personal Goals (by the next clinic visit) :      Personal blood pressure goal: 140/90  (06/17/2009)   Patient will work on the following items until the next clinic visit to reach self-care goals:     Medications and monitoring: take my medicines every day  (09/22/2010)     Eating: eat baked foods instead of fried foods, eat fruit for snacks and desserts  (09/22/2010)     Activity: take a 30 minute walk every day  (02/03/2010)    Hypertension self-management support: Written self-care plan  (09/22/2010)   Hypertension self-care plan printed.

## 2010-11-11 NOTE — Miscellaneous (Signed)
Summary: [facility]   Imported By: Margie Billet 03/18/2010 10:47:30  _____________________________________________________________________  External Attachment:    Type:   Image     Comment:   External Document

## 2010-11-11 NOTE — Miscellaneous (Signed)
Summary: [facility]/ [facility]ESSIONAL [facility]UN[facility]  [facility]   Imported By: Margie Billet 12/28/2009 10:59:07  _____________________________________________________________________  External Attachment:    Type:   Image     Comment:   External Document

## 2010-11-11 NOTE — Progress Notes (Signed)
Summary: Soc. Work  Nurse, children's placed by: Maisie Fus, son Call placed to: Soc Work Summary of Call: Maisie Fus son   [phone]     Maisie Fus called to thank me for the services ordered for his mother (home health and PCS aide).  He said that it all worked out very well.  He also said that she got a new walker instead of a power wheelchair and that she is moving very well.   I thanked him for calling to let us know positive outcome.

## 2010-11-11 NOTE — Progress Notes (Signed)
Summary: refill/gg  **  Phone Note Refill Request  on September 20, 2010 10:31 AM  Refills Requested: Medication #1:  AMITRIPTYLINE HCL 25 MG TABS Take 1 tablet by mouth once a day Pt wants 3 month supply  !!   Method Requested: Electronic Initial call taken by: Merrie Roof RN,  September 20, 2010 10:31 AM  Follow-up for Phone Call        Refill approved-nurse to complete    Prescriptions: AMITRIPTYLINE HCL 25 MG TABS (AMITRIPTYLINE HCL) Take 1 tablet by mouth once a day  #90 x 2   Entered and Authorized by:   Vassie Loll MD   Signed by:   Vassie Loll MD on 09/20/2010   Method used:   [facility]ronically to        [facility] [address] [phone]* (retail)       [address]       [place], [place]  [address]       Ph: [phone] or [phone]       Fax: [phone]   RxID:   [id number]

## 2010-11-11 NOTE — Letter (Signed)
Summary: [place] [facility] Medical Assistance: PCS  [place] [facility] Medical Assistance: PCS   Imported By: Florinda Marker 12/16/2009 16:42:09  _____________________________________________________________________  External Attachment:    Type:   Image     Comment:   External Document

## 2010-11-11 NOTE — Progress Notes (Signed)
  Phone Note Outgoing Call   Call placed by: Patient Summary of Call: This is the second time the patient is calling to end her PCS services. She said she is doing much better since she started services.   [facility] is her agency.  I have called Ms. Deloria Lair at [facility] who took the request.  I told Ms. Hamilton that it is the patient's choice as to whether she wants services or not.  I asked the patient several times whether she was sure she wanted to end services and she was certain. She said she could bathe and dress herself.  I told her to take my name and phone number if she wanted to start up services again.

## 2010-11-11 NOTE — Miscellaneous (Signed)
Summary: [facility]   Imported By: Margie Billet 01/27/2010 12:19:43  _____________________________________________________________________  External Attachment:    Type:   Image     Comment:   External Document

## 2010-11-11 NOTE — Assessment & Plan Note (Signed)
Summary: F/U/EST/VS   Vital Signs:  Patient profile:   77 year old female Height:      64 inches (162.56 cm) Weight:      141.01 pounds (64.10 kg) BMI:     24.29 Temp:     98.4 degrees F (36.89 degrees C) oral Pulse rate:   80 / minute BP sitting:   111 / 64  (left arm)  Vitals Entered By: Angelina Ok RN (February 03, 2010 1:30 PM) Is Patient Diabetic? No Pain Assessment Patient in pain? no      Nutritional Status BMI of 19 -24 = normal  Have you ever been in a relationship where you felt threatened, hurt or afraid?No   Does patient need assistance? Functional Status Self care Ambulation Impaired:Risk for fall Comments Uses a  rolling walker.  Needs refills on meds.  Check up.   Primary Care Provider:  Vassie Loll MD   History of Present Illness: 77 y/o female with pmh as described on the EMR. Who comes tot he clinic for followup on her depression, leg swelling, renal insuficiency, HTN, poor appetite and restless leg syndrome.  Patient is feeling great today. Denies legs swelling, improved appetite and also decreased on her legs discomfort at night. Patient endorses to be more energetic and having a better mood.  Except for mild constipation due to the use of ferrous sulfate, patient denies any abdominal pain, melena, hematochezia, dizziness, Ha, SOB, CP or any other complaints today.   She came into the clinic using a rolling walker w/o difficulties to use it.  BP today 111/76.  Depression History:      The patient denies a depressed mood most of the day and a diminished interest in her usual daily activities.        The patient denies that she feels like life is not worth living, denies that she wishes that she were dead, and denies that she has thought about ending her life.         Preventive Screening-Counseling & Management  Alcohol-Tobacco     Alcohol drinks/day: 0     Smoking Status: never  Problems Prior to Update: 1)  Leg Edema, Bilateral   (ICD-782.3) 2)  Dry Skin  (ICD-701.1) 3)  Personal History of Other Specified Diseases  (ICD-V13.8) 4)  Depression  (ICD-311) 5)  Hemoccult Positive Stool  (ICD-578.1) 6)  Leukocytosis  (ICD-288.60) 7)  Transaminases, Serum, Elevated  (ICD-790.4) 8)  Restless Leg Syndrome  (ICD-333.94) 9)  Hip Pain, Left  (ICD-719.45) 10)  Neck Pain  (ICD-723.1) 11)  Hypertension  (ICD-401.9) 12)  Health Maintenance Exam  (ICD-V70.0) 13)  Renal Insufficiency, Mild  (ICD-588.9) 14)  Anemia Nos  (ICD-285.9) 15)  Postnasal Drip Syndrome, Allergic  (ICD-477.9) 16)  Spinal Stenosis, Lumbar  (ICD-724.02) 17)  Rupture, Rotator Cuff  (ICD-727.61) 18)  Cataracts, Bilateral  (ICD-366.9) 19)  Hemorrhoids  (ICD-455.6)  Current Problems (verified): 1)  Leg Edema, Bilateral  (ICD-782.3) 2)  Dry Skin  (ICD-701.1) 3)  Personal History of Other Specified Diseases  (ICD-V13.8) 4)  Depression  (ICD-311) 5)  Hemoccult Positive Stool  (ICD-578.1) 6)  Leukocytosis  (ICD-288.60) 7)  Transaminases, Serum, Elevated  (ICD-790.4) 8)  Restless Leg Syndrome  (ICD-333.94) 9)  Hip Pain, Left  (ICD-719.45) 10)  Neck Pain  (ICD-723.1) 11)  Hypertension  (ICD-401.9) 12)  Health Maintenance Exam  (ICD-V70.0) 13)  Renal Insufficiency, Mild  (ICD-588.9) 14)  Anemia Nos  (ICD-285.9) 15)  Postnasal Drip Syndrome, Allergic  (  ICD-477.9) 16)  Spinal Stenosis, Lumbar  (ICD-724.02) 17)  Rupture, Rotator Cuff  (ICD-727.61) 18)  Cataracts, Bilateral  (ICD-366.9) 19)  Hemorrhoids  (ICD-455.6)  Medications Prior to Update: 1)  Hydrochlorothiazide 25 Mg Tabs (Hydrochlorothiazide) .... Once Daily 2)  Lopressor 50 Mg  Tabs (Metoprolol Tartrate) .... Take 1 Tablet By Mouth Once A Day 3)  Diovan 80 Mg Tabs (Valsartan) .... Take 1 Tablet By Mouth Once A Day 4)  Tylenol 8 Hour 650 Mg  Cr-Tabs (Acetaminophen) .... Take 1 Tablet By Mouth Three Times A Day 5)  Gabapentin 300 Mg Caps (Gabapentin) .... Take 1-2 Capsules As Needed For Pain At  Bedtime 6)  Eucerin Plus 5-5 % Lotn (Emollient) .... Apply Twice A Day in The Affected Skin Area. 7)  Remeron 15 Mg Tabs (Mirtazapine) .... Take 1 Tab By Mouth At Bedtime 8)  Amitriptyline Hcl 25 Mg Tabs (Amitriptyline Hcl) .... Take 1 Tablet By Mouth Once A Day 9)  Ferrous Sulfate 325 (65 Fe) Mg Tabs (Ferrous Sulfate) .... Take 1 Tablet By Mouth Two Times A Day  Current Medications (verified): 1)  Hydrochlorothiazide 25 Mg Tabs (Hydrochlorothiazide) .... Once Daily 2)  Lopressor 50 Mg  Tabs (Metoprolol Tartrate) .... Take 1 Tablet By Mouth Once A Day 3)  Diovan 80 Mg Tabs (Valsartan) .... Take 1 Tablet By Mouth Once A Day 4)  Tylenol 8 Hour 650 Mg  Cr-Tabs (Acetaminophen) .... Take 1 Tablet By Mouth Three Times A Day 5)  Gabapentin 300 Mg Caps (Gabapentin) .... Take 1-2 Capsules As Needed For Pain At Bedtime 6)  Eucerin Plus 5-5 % Lotn (Emollient) .... Apply Twice A Day in The Affected Skin Area. 7)  Remeron 15 Mg Tabs (Mirtazapine) .... Take 1 Tab By Mouth At Bedtime 8)  Amitriptyline Hcl 25 Mg Tabs (Amitriptyline Hcl) .... Take 1 Tablet By Mouth Once A Day 9)  Ferrous Sulfate 325 (65 Fe) Mg Tabs (Ferrous Sulfate) .... Take 1 Tablet By Mouth Two Times A Day  Allergies (verified): No Known Drug Allergies  Past History:  Past Medical History: Last updated: 05/15/2007 Headache Hypertension  Social History: Last updated: 02/03/2009 originally from [place].  Her father was a IT consultant trained at [employer].  She has been in the [place] since the 60's.  Sometimes goes back to visit family in [place].   Has one son here in [place]. Sister named Magdalene Patricia lives in [place].  Risk Factors: Smoking Status: never (02/03/2010)  Review of Systems       As per HPI.  Physical Exam  General:  alert, well-developed, well-hydrated, cooperative to examination, and good hygiene.   Lungs:  Normal respiratory effort, chest expands symmetrically. Lungs are clear to auscultation, no  crackles or wheezes. Heart:  Normal rate and regular rhythm. S1 and S2 normal without gallop, murmur, click, rub or other extra sounds. Abdomen:  soft, non-tender, positive BS and without distention.   Msk:  Mild tenderness on her lower back, no radiation and with mild decreased range of motion (regarding flexion and extension of her hips). Neurologic:  alert & oriented X3, cranial nerves II-XII intact; no deficit. Muscle strength was 4-5/5 bilaterally.     Impression & Recommendations:  Problem # 1:  LEG EDEMA, BILATERAL (ICD-782.3) Now that patient is eating, ambulating again and been more active she only has trace of edema bilaterally. Will continue current management and will follow her sx's during next visit. diuretics were discontinue due to worsening of her kidney function  during last visit. BP is well controlled w/o it.  The following medications were removed from the medication list:    Hydrochlorothiazide 25 Mg Tabs (Hydrochlorothiazide) ..... Once daily  Problem # 2:  DEPRESSION (ICD-311) Excellent response to remeron and amitriptylene. Mood is not flat or depressed anymore; she is more energetic and is currently more active. Will continue same meds regimen for now and will continue following her symptoms during next visit.  Her updated medication list for this problem includes:    Remeron 15 Mg Tabs (Mirtazapine) .Marland Kitchen... Take 1 tab by mouth at bedtime    Amitriptyline Hcl 25 Mg Tabs (Amitriptyline hcl) .Marland Kitchen... Take 1 tablet by mouth once a day  Problem # 3:  RESTLESS LEG SYNDROME (ICD-333.94) Improved with the use of amitriptylene. will also continue using ferrous sulfate.  Problem # 4:  HYPERTENSION (ICD-401.9) Well controlled using just lopressor. will continue current regimen and will encourage patient to follow low sodium diet. Renal function is back to normal and electrolytes are WNL now.  The following medications were removed from the medication list:     Hydrochlorothiazide 25 Mg Tabs (Hydrochlorothiazide) ..... Once daily    Diovan 80 Mg Tabs (Valsartan) .Marland Kitchen... Take 1 tablet by mouth once a day Her updated medication list for this problem includes:    Lopressor 50 Mg Tabs (Metoprolol tartrate) .Marland Kitchen... Take 1 tablet by mouth once a day  Orders: T-CBC No Diff ([id number]) T-Comprehensive Metabolic Panel ([id number])  Problem # 5:  RENAL INSUFFICIENCY, MILD (ICD-588.9) Now that she is hydrated and that her ARB and HCTZ were discontinued; renal function is back to normal. will continue holding her nephrotoxic antihypertensive since BP is well controlled w/o them and her renal function has also improved after stopping them. will follow labs and medication adjustment/needs during next visit.  Problem # 6:  ANEMIA NOS (ICD-285.9)  CBC with Hgb in the 7.6 range. will increased her ferrous sulfate to three times a day and will send hemmocult cards to evaluate for FOBT. Patient will follow with [place] on Mar 02, 2010 and recheck labs to determine if she will need or not  transfusion.  Her updated medication list for this problem includes:    Ferrous Sulfate 325 (65 Fe) Mg Tabs (Ferrous sulfate) .Marland Kitchen... Take 1 tablet by mouth three times a day  Orders: T-Hemoccult Card-Multiple (take home) (84696)  Complete Medication List: 1)  Lopressor 50 Mg Tabs (Metoprolol tartrate) .... Take 1 tablet by mouth once a day 2)  Tylenol 8 Hour 650 Mg Cr-tabs (Acetaminophen) .... Take 1 tablet by mouth three times a day 3)  Gabapentin 300 Mg Caps (Gabapentin) .... Take 1-2 capsules as needed for pain at bedtime 4)  Eucerin Plus 5-5 % Lotn (Emollient) .... Apply twice a day in the affected skin area. 5)  Remeron 15 Mg Tabs (Mirtazapine) .... Take 1 tab by mouth at bedtime 6)  Amitriptyline Hcl 25 Mg Tabs (Amitriptyline hcl) .... Take 1 tablet by mouth once a day 7)  Ferrous Sulfate 325 (65 Fe) Mg Tabs (Ferrous sulfate) .... Take 1 tablet by mouth three times a day 8)   Miralax Powd (Polyethylene glycol 3350) .... Take 17 gram of miralax with 8 ounces of juice of water two times a day until bowel movement achieved, then once a day.  Patient Instructions: 1)  Take your medications as prescribed. 2)  Please schedule a follow-up appointment in 1 month. 3)  Keep yourself hydrated and increased fiber ingestion on your diet.  4)  Follow a low sodium diet. 5)  Keep yourself active and follow physical therapy instrucions and advices. 6)  You will be called with any abnormalities in the tests scheduled or performed today.  If you don't hear from [place] within a week from when the test was performed, you can assume that your test was normal. Prescriptions: AMITRIPTYLINE HCL 25 MG TABS (AMITRIPTYLINE HCL) Take 1 tablet by mouth once a day  #31 x 2   Entered and Authorized by:   Vassie Loll MD   Signed by:   Vassie Loll MD on 02/03/2010   Method used:   [facility]        [facility] [address] [phone]* (retail)       [address]       [place], [place]  [address]       Ph: [phone] or [phone]       Fax: [phone]   RxID:   [id number] REMERON 15 MG TABS (MIRTAZAPINE) Take 1 tab by mouth at bedtime  #31 x 2   Entered and Authorized by:   Vassie Loll MD   Signed by:   Vassie Loll MD on 02/03/2010   Method used:   [facility]        [facility] [address] [phone]* (retail)       [address]       [place], [place]  [address]       Ph: [phone] or [phone]       Fax: [phone]   RxID:   [id number] LOPRESSOR 50 MG  TABS (METOPROLOL TARTRATE) Take 1 tablet by mouth once a day  #90 x 3   Entered and Authorized by:   Vassie Loll MD   Signed by:   Vassie Loll MD on 02/03/2010   Method used:   [facility] to        [facility] [address] [phone]* (retail)       [address]       [place], [place]  [address]       Ph: [phone] or  [phone]       Fax: [phone]   RxID:   [id number] MIRALAX  POWD (POLYETHYLENE GLYCOL 3350) take 17 gram of miralax with 8 ounces of juice of water two times a day until bowel movement achieved, then once a day.  #31 x 2   Entered and Authorized by:   Vassie Loll MD   Signed by:   Vassie Loll MD on 02/03/2010   Method used:   [facility]ronically to        [facility] [address] [phone]* (retail)       [address]       [place], [place]  [address]       Ph: [phone] or [phone]       Fax: [phone]   RxID:   [id number]   Prevention & Chronic Care Immunizations   Influenza vaccine: Fluvax MCR  (08/16/2007)   Influenza vaccine deferral: Deferred  (06/17/2009)    Tetanus booster: 06/18/2009: Td    Pneumococcal vaccine: Not documented   Pneumococcal vaccine deferral: Deferred  (06/17/2009)    H. zoster vaccine: Not documented   H. zoster vaccine deferral: Deferred  (06/17/2009)  Colorectal Screening  Hemoccult: Not documented   Hemoccult action/deferral: Ordered  (06/17/2009)    Colonoscopy: Not documented   Colonoscopy action/deferral: Deferred  (06/17/2009)  Other Screening   Pap smear: Not documented   Pap smear action/deferral: Not indicated S/P hysterectomy  (06/17/2009)    Mammogram: No specific mammographic evidence of malignancy.    (01/19/2009)   Mammogram due: 01/2010    DXA bone density scan: Not documented   DXA bone density action/deferral: Ordered  (06/17/2009)   Smoking status: never  (02/03/2010)  Lipids   Total Cholesterol: 141  (12/25/2008)   LDL: 70  (12/25/2008)   LDL Direct: Not documented   HDL: 51  (12/25/2008)   Triglycerides: 98  (12/25/2008)  Hypertension   Last Blood Pressure: 111 / 64  (02/03/2010)   Serum creatinine: 1.22  (12/07/2009)   BMP action: Ordered   Serum potassium 3.8  (12/07/2009) CMP ordered   Self-Management Support :   Personal Goals (by the next clinic visit)  :      Personal blood pressure goal: 140/90  (06/17/2009)   Patient will work on the following items until the next clinic visit to reach self-care goals:     Medications and monitoring: take my medicines every day, bring all of my medications to every visit  (02/03/2010)     Eating: drink diet soda or water instead of juice or soda, use fresh or frozen vegetables, eat foods that are low in salt, eat baked foods instead of fried foods, eat fruit for snacks and desserts, limit or avoid alcohol  (02/03/2010)     Activity: take a 30 minute walk every day  (02/03/2010)    Hypertension self-management support: Written self-care plan, Education handout, Pre-printed educational material  (02/03/2010)   Hypertension self-care plan printed.   Hypertension education handout printed  Process Orders Check Orders Results:     [facility]: Check successful Tests Sent for requisitioning (Feb 09, 2010 11:48 AM):     02/03/2010: [facility] -- T-CBC No Diff [[id number]] (signed)     02/03/2010: [facility] -- T-Comprehensive Metabolic Panel [id number] (signed)     Vital Signs:  Patient profile:   77 year old female Height:      64 inches (162.56 cm) Weight:      141.01 pounds (64.10 kg) BMI:     24.29 Temp:     98.4 degrees F (36.89 degrees C) oral Pulse rate:   80 / minute BP sitting:   111 / 64  (left arm)  Vitals Entered By: Angelina Ok RN (February 03, 2010 1:30 PM)

## 2010-11-11 NOTE — Progress Notes (Signed)
Summary: refill/gg  Phone Note Refill Request  on June 08, 2010 4:18 PM  Wants refill for lidoderm %5 patches for her back pain.  This was taken off her med list.   Method Requested: Electronic Initial call taken by: Merrie Roof RN,  June 08, 2010 4:18 PM  Follow-up for Phone Call        This was given to her for an acute treatment and not a long term therapy (when she finished tx, prescription was removed). She has an appointment with me on the 31st; I will evaluate her back pain and give prescription again if needed.

## 2010-11-11 NOTE — Progress Notes (Signed)
Summary: refill/gg  Phone Note Refill Request  on June 17, 2010 4:44 PM  Refills Requested: Medication #1:  FERROUS SULFATE 325 (65 FE) MG TABS Take 1 tablet by mouth three times a day  Method Requested: Electronic Initial call taken by: Merrie Roof RN,  June 17, 2010 4:44 PM  Follow-up for Phone Call        Refill approved-nurse to complete    Prescriptions: FERROUS SULFATE 325 (65 FE) MG TABS (FERROUS SULFATE) Take 1 tablet by mouth three times a day  #90 x 5   Entered and Authorized by:   Vassie Loll MD   Signed by:   Vassie Loll MD on 06/17/2010   Method used:   [facility]        [facility] [address] [phone]* (retail)       [address]       [place], [place]  [address]       Ph: [phone] or [phone]       Fax: [phone]   RxID:   [id number]

## 2010-11-11 NOTE — Letter (Signed)
Summary: [facility]   Imported By: Margie Billet 02/16/2010 12:10:56  _____________________________________________________________________  External Attachment:    Type:   Image     Comment:   External Document

## 2010-11-11 NOTE — Miscellaneous (Signed)
Summary: [facility]: Orders  [facility]: Orders   Imported By: Florinda Marker 01/04/2010 11:06:39  _____________________________________________________________________  External Attachment:    Type:   Image     Comment:   External Document

## 2010-11-11 NOTE — Assessment & Plan Note (Signed)
Summary: Social Work   Social Work Evaluation Date  12/07/2009 Patient name [name]  Primary MD   : Vassie Loll MD Social Worker's name : Dorothe Pea MSW- LCSW  Home [phone]    Cell phone: .  Marland Kitchen     Alternate phone: . Marland Kitchen        Primary Reason for Referral:     Home Health/Personal Care Services Comments Patient lives alone, has difficulties ambulating with walker, walks slowly and has pain.  Inquiring about home health, PCS, and power wheelchair.  Has fallen in the home.  Uses walker at home and when out shopping with son.   Has both Medicaid and Medicare. Cannot remember when she last had PT/OT.    Son Maisie Fus [phone])    Address [address] in [place]  [address].  No other family members. Action taken by Social Work: I will call [facility] today to order PT/OT and also assessment of need for power wheelchair.   We will also get an order for PCS assessment started.

## 2010-11-11 NOTE — Progress Notes (Signed)
Summary: Refill/gh  Phone Note Refill Request Message from:  Patient on May 11, 2010 11:47 AM  Refills Requested: Medication #1:  AMITRIPTYLINE HCL 25 MG TABS Take 1 tablet by mouth once a day  Medication #2:  REMERON 15 MG TABS Take 1 tab by mouth at bedtime  Method Requested: Electronic Initial call taken by: Angelina Ok RN,  May 11, 2010 11:47 AM  Follow-up for Phone Call        Refill approved-nurse to complete    Prescriptions: AMITRIPTYLINE HCL 25 MG TABS (AMITRIPTYLINE HCL) Take 1 tablet by mouth once a day  #31 x 3   Entered and Authorized by:   Vassie Loll MD   Signed by:   Vassie Loll MD on 05/11/2010   Method used:   [facility] to        [facility] [address] [phone]* (retail)       [address]       [place], [place]  [address]       Ph: [phone] or [phone]       Fax: [phone]   RxID:   [id number] REMERON 15 MG TABS (MIRTAZAPINE) Take 1 tab by mouth at bedtime  #31 x 3   Entered and Authorized by:   Vassie Loll MD   Signed by:   Vassie Loll MD on 05/11/2010   Method used:   [facility]ronically to        [facility] [address] [phone]* (retail)       [address]       [place], [place]  [address]       Ph: [phone] or [phone]       Fax: [phone]   RxID:   [id number]

## 2010-11-11 NOTE — Miscellaneous (Signed)
Summary: [facility]  [facility]   Imported By: Shon Hough 06/15/2010 12:19:59  _____________________________________________________________________  External Attachment:    Type:   Image     Comment:   External Document

## 2010-11-11 NOTE — Assessment & Plan Note (Signed)
Summary: ACUTE-SWOLLEN FEET HARD TO WALK/CFB   Vital Signs:  Patient profile:   77 year old female Height:      64 inches (162.56 cm) Weight:      140.4 pounds (63.82 kg) BMI:     24.19 Temp:     97.7 degrees F (36.50 degrees C) oral Pulse rate:   68 / minute BP sitting:   111 / 68  (left arm)  Vitals Entered By: Stanton Kidney Ditzler RN (December 07, 2009 10:35 AM) Is Patient Diabetic? No Pain Assessment Patient in pain? yes     Location: both legs Intensity: 9 Type: aching Onset of pain  past 1-2 months Nutritional Status BMI of 19 -24 = normal Nutritional Status Detail appetite down  Have you ever been in a relationship where you felt threatened, hurt or afraid?denies   Does patient need assistance? Functional Status Self care Ambulation Wheelchair Comments Son with pt. Both legs aching past 1-2 months. Problems with memory. Feet swollen. Needs home assist and power scooter.   Primary Care Provider:  Vassie Loll MD   History of Present Illness: 77 y/o woman with pmh as described on the EMR. Who came to the clinic complaining of bilateral feet swelling and pain, weight loss, increased fatigue, anorexia and also complaining of insomnia.  Patient was recently in [place] visiting her family for about 3 months and over there she was pretty much neglected, regarding proper attention and care. Patient's son reports that since then she has also having difficulty sleeping and difficulty concentrating and remembering stuff.  Patient has continue to be compliant with her medications and denies any further complaints. (she is still living by herself and is now struggling regarding having her ADL's done alone).  Depression History:      The patient is having a depressed mood most of the day but denies diminished interest in her usual daily activities.  Positive alarm features for depression include significant weight loss, insomnia, and fatigue (loss of energy).        The patient denies  that she feels like life is not worth living, denies that she wishes that she were dead, and denies that she has thought about ending her life.         Preventive Screening-Counseling & Management  Alcohol-Tobacco     Alcohol drinks/day: 0     Smoking Status: never  Caffeine-Diet-Exercise     Does Patient Exercise: no  Problems Prior to Update: 1)  Personal History of Other Specified Diseases  (ICD-V13.8) 2)  Depression  (ICD-311) 3)  Hemoccult Positive Stool  (ICD-578.1) 4)  Leukocytosis  (ICD-288.60) 5)  Transaminases, Serum, Elevated  (ICD-790.4) 6)  Restless Leg Syndrome  (ICD-333.94) 7)  Hip Pain, Left  (ICD-719.45) 8)  Neck Pain  (ICD-723.1) 9)  Hypertension  (ICD-401.9) 10)  Health Maintenance Exam  (ICD-V70.0) 11)  Renal Insufficiency, Mild  (ICD-588.9) 12)  Anemia Nos  (ICD-285.9) 13)  Postnasal Drip Syndrome, Allergic  (ICD-477.9) 14)  Spinal Stenosis, Lumbar  (ICD-724.02) 15)  Rupture, Rotator Cuff  (ICD-727.61) 16)  Cataracts, Bilateral  (ICD-366.9) 17)  Hemorrhoids  (ICD-455.6)  Current Problems (verified): 1)  Hemoccult Positive Stool  (ICD-578.1) 2)  Leukocytosis  (ICD-288.60) 3)  Transaminases, Serum, Elevated  (ICD-790.4) 4)  Restless Leg Syndrome  (ICD-333.94) 5)  Hip Pain, Left  (ICD-719.45) 6)  Neck Pain  (ICD-723.1) 7)  Hypertension  (ICD-401.9) 8)  Health Maintenance Exam  (ICD-V70.0) 9)  Renal Insufficiency, Mild  (ICD-588.9) 10)  Anemia Nos  (  ICD-285.9) 11)  Postnasal Drip Syndrome, Allergic  (ICD-477.9) 12)  Spinal Stenosis, Lumbar  (ICD-724.02) 13)  Rupture, Rotator Cuff  (ICD-727.61) 14)  Cataracts, Bilateral  (ICD-366.9) 15)  Hemorrhoids  (ICD-455.6)  Medications Prior to Update: 1)  Hydrochlorothiazide 25 Mg Tabs (Hydrochlorothiazide) .... Once Daily 2)  Lopressor 50 Mg  Tabs (Metoprolol Tartrate) .... Take 1 Tablet By Mouth Once A Day 3)  Aspir-Low 81 Mg Tbec (Aspirin) .... Take 1 Tablet By Mouth Once A Day 4)  Diovan 80 Mg Tabs  (Valsartan) .... Take 1 Tablet By Mouth Once A Day 5)  Tylenol 8 Hour 650 Mg  Cr-Tabs (Acetaminophen) .... Take 1 Tablet By Mouth Three Times A Day 6)  Gabapentin 300 Mg Caps (Gabapentin) .... Take 1-2 Capsules As Needed For Pain At Bedtime 7)  Cyclobenzaprine Hcl 10 Mg Tabs (Cyclobenzaprine Hcl) .... Take 1 Tablet By Mouth Every 8 Hours As Needed For Spams 8)  Lidoderm 5 % Ptch (Lidocaine) .... Apply On The Skin of Your Back and Change Every 12 Hours. 9)  Ferrous Sulfate 325 (65 Fe) Mg Tabs (Ferrous Sulfate) .... Take 1 Tablet By Mouth Three Times A Day 10)  Ensure Plus  Liqd (Nutritional Supplements) .... Drink One Shake If You Miss More Than One Meal A Day.  Current Medications (verified): 1)  Hydrochlorothiazide 25 Mg Tabs (Hydrochlorothiazide) .... Once Daily 2)  Lopressor 50 Mg  Tabs (Metoprolol Tartrate) .... Take 1 Tablet By Mouth Once A Day 3)  Diovan 80 Mg Tabs (Valsartan) .... Take 1 Tablet By Mouth Once A Day 4)  Tylenol 8 Hour 650 Mg  Cr-Tabs (Acetaminophen) .... Take 1 Tablet By Mouth Three Times A Day 5)  Gabapentin 300 Mg Caps (Gabapentin) .... Take 1-2 Capsules As Needed For Pain At Bedtime  Allergies (verified): No Known Drug Allergies  Past History:  Past Medical History: Last updated: 05/15/2007 Headache Hypertension  Social History: Last updated: 02/03/2009 originally from [place].  Her father was a IT consultant trained at [employer].  She has been in the [place] since the 60's.  Sometimes goes back to visit family in [place].   Has one son here in [place]. Sister named Magdalene Patricia lives in [place].  Risk Factors: Alcohol Use: 0 (12/07/2009) Exercise: no (12/07/2009)  Risk Factors: Smoking Status: never (12/07/2009)  Review of Systems       The patient complains of anorexia, weight loss, peripheral edema, muscle weakness, difficulty walking, and depression.  The patient denies fever, decreased hearing, chest pain, syncope, dyspnea on exertion,  prolonged cough, headaches, hemoptysis, abdominal pain, melena, hematochezia, severe indigestion/heartburn, hematuria, incontinence, abnormal bleeding, and enlarged lymph nodes.    Physical Exam  General:  alert, underweight appearing, anxious and in mild distress with huge concerns regarding current situation. Patient was cooperative with examination.   Neck:  supple and full ROM.   Lungs:  Normal respiratory effort, chest expands symmetrically. Lungs are clear to auscultation, no crackles or wheezes. Heart:  Normal rate and regular rhythm. S1 and S2 normal without gallop, murmur, click, rub or other extra sounds. Abdomen:  soft, non-tender, normal bowel sounds, no distention, and no masses.   Rectal:  no fissures and no perianal rash.  Patient with dry skin in her perianal area, no rash and no open wound. Extremities:  1+ left pedal edema and 1+ right pedal edema.   Neurologic:  alert & oriented X3, cranial nerves II-XII intact, and siting on a wheelchair.  Muscle strength was 3-4/5 bilaterally.  Impression & Recommendations:  Problem # 1:  DEPRESSION (ICD-311) Patient with symptoms c/w severe depression and deconditioning. TSH and Free T4 are normal. Will start patient on low dose remeron(which would also help for appetite) and also on amitriptylene (which would help for restless leg syndrome). Will follow her symptoms and treatment response.   Her updated medication list for this problem includes:    Remeron 15 Mg Tabs (Mirtazapine) .Marland Kitchen... Take 1 tab by mouth at bedtime    Amitriptyline Hcl 25 Mg Tabs (Amitriptyline hcl) .Marland Kitchen... Take 1 tablet by mouth once a day  Problem # 2:  ANEMIA NOS (ICD-285.9) Patient CBC has shown a Hgb of 9.6; will restart her ferrous sulfate two times a day as previously prescribed. will follow her CBC during next visit.  The following medications were removed from the medication list:    Ferrous Sulfate 325 (65 Fe) Mg Tabs (Ferrous sulfate) .Marland Kitchen... Take 1  tablet by mouth three times a day Her updated medication list for this problem includes:    Ferrous Sulfate 325 (65 Fe) Mg Tabs (Ferrous sulfate) .Marland Kitchen... Take 1 tablet by mouth two times a day  Problem # 3:  HYPERTENSION (ICD-401.9) BP was low and patient mildly dhydrated. Will hold HCTZ and Diovan for now. Will continue using lopressor and will follow BP and renal function on her next visit. will advised to follow a low sodium diet and depending her BP at followup will restart her medications or make further changes.  Her updated medication list for this problem includes:    Hydrochlorothiazide 25 Mg Tabs (Hydrochlorothiazide) ..... Once daily    Lopressor 50 Mg Tabs (Metoprolol tartrate) .Marland Kitchen... Take 1 tablet by mouth once a day    Diovan 80 Mg Tabs (Valsartan) .Marland Kitchen... Take 1 tablet by mouth once a day  Orders: T-Comprehensive Metabolic Panel [phone]) T-TSH [phone]) T-T4, Free [phone]) T-CBC w/Diff ([id number])  Problem # 4:  RESTLESS LEG SYNDROME (ICD-333.94) Will start patient on amitriptylene for this problem and will also start iron repletion. Will follow on her symptoms.  Problem # 5:  PERSONAL HISTORY OF OTHER SPECIFIED DISEASES (ICD-V13.8)  Patient with chronic spinal stenosis, restless leg syndrome, moderate depression and also having acute deconditioning. will make referral for PT/OT; ordered PCS  and also evaluation for motorized wheelchair.  Orders: Occupational Therapy (OT) Physical Therapy Referral (PT)  Problem # 6:  DRY SKIN (ICD-701.1) Dry skin on her sacral area, no erythema or open wound. will provide prescription for eucerin as barrier cream and will make sure patient change position constantly and improved her food intake to help with low protein.  Problem # 7:  LEG EDEMA, BILATERAL (ICD-782.3) venous insufficiency and also 2/2 hypoalbuminemia. will keep legs elevated and will recommend the use of ensure at least two times a day. Will aslo start  remeron for depression and to help estimulating her appetite.  Her updated medication list for this problem includes:    Hydrochlorothiazide 25 Mg Tabs (Hydrochlorothiazide) ..... Once daily  Complete Medication List: 1)  Hydrochlorothiazide 25 Mg Tabs (Hydrochlorothiazide) .... Once daily 2)  Lopressor 50 Mg Tabs (Metoprolol tartrate) .... Take 1 tablet by mouth once a day 3)  Diovan 80 Mg Tabs (Valsartan) .... Take 1 tablet by mouth once a day 4)  Tylenol 8 Hour 650 Mg Cr-tabs (Acetaminophen) .... Take 1 tablet by mouth three times a day 5)  Gabapentin 300 Mg Caps (Gabapentin) .... Take 1-2 capsules as needed for pain at bedtime 6)  Eucerin Plus  5-5 % Lotn (Emollient) .... Apply twice a day in the affected skin area. 7)  Remeron 15 Mg Tabs (Mirtazapine) .... Take 1 tab by mouth at bedtime 8)  Amitriptyline Hcl 25 Mg Tabs (Amitriptyline hcl) .... Take 1 tablet by mouth once a day 9)  Ferrous Sulfate 325 (65 Fe) Mg Tabs (Ferrous sulfate) .... Take 1 tablet by mouth two times a day  Patient Instructions: 1)  Please schedule a follow-up appointment in 2 months. 2)  Take your medications as prescribed. 3)  Follow a low sodium diet. 4)  You will be called with any abnormalities in the tests scheduled or performed today.  5)  Hold your HCTZ and also your Diovan, until further instructions. 6)  Keep your legs elevated. 7)  Start using ensure at least two times a day. Prescriptions: FERROUS SULFATE 325 (65 FE) MG TABS (FERROUS SULFATE) Take 1 tablet by mouth two times a day  #62 x 6   Entered and Authorized by:   Vassie Loll MD   Signed by:   Vassie Loll MD on 12/09/2009   Method used:   [facility]ron[facility] to        [facility] [address] [phone]* (retail)       [address]       [place], [place]  [id number]       Ph: [phone] or [phone]       Fax: [phone]   RxID:   [id number] AMITRIPTYLINE HCL 25 MG TABS (AMITRIPTYLINE HCL) Take 1 tablet by  mouth once a day  #31 x 1   Entered and Authorized by:   Vassie Loll MD   Signed by:   Vassie Loll MD on 12/07/2009   Method used:   [facility]        [facility] [address] [phone]* (retail)       [address]       [place], [place]  [id number]       Ph: [phone] or [phone]       Fax: [phone]   RxID:   [id number] REMERON 15 MG TABS (MIRTAZAPINE) Take 1 tab by mouth at bedtime  #31 x 1   Entered and Authorized by:   Vassie Loll MD   Signed by:   Vassie Loll MD on 12/07/2009   Method used:   [facility]        [facility] [address] [phone]* (retail)       [address]       [place], [place]  [id number]       Ph: [phone] or [phone]       Fax: [phone]   RxID:   [id number] EUCERIN PLUS 5-5 % LOTN (EMOLLIENT) Apply twice a day in the affected skin area.  #1 x 1   Entered and Authorized by:   Vassie Loll MD   Signed by:   Vassie Loll MD on 12/07/2009   Method used:   [facility]        [facility] [address] [phone]* (retail)       [address]       [place], [place]  [id number]       Ph: [phone] or [phone]       Fax: [phone]   RxID:  [id number]  Process Orders Check Orders Results:     [facility]: Check successful Tests Sent for requisitioning (December 09, 2009 5:53 PM):     12/07/2009: [facility] -- T-Comprehensive Metabolic Panel [[id number]] (signed)     12/07/2009: [facility] -- T-TSH [phone] (signed)     12/07/2009: [facility] -- [place] [[id number]] (signed)     12/07/2009: [facility] -- [facility] w/Diff [phone] (signed)   Prevention & Chronic Care Immunizations   Influenza vaccine: Fluvax MCR  (08/16/2007)   Influenza vaccine deferral: Deferred  (06/17/2009)    Tetanus booster: 06/18/2009: Td    Pneumococcal  vaccine: Not documented   Pneumococcal vaccine deferral: Deferred  (06/17/2009)    H. zoster vaccine: Not documented   H. zoster vaccine deferral: Deferred  (06/17/2009)  Colorectal Screening   Hemoccult: Not documented   Hemoccult action/deferral: Ordered  (06/17/2009)    Colonoscopy: Not documented   Colonoscopy action/deferral: Deferred  (06/17/2009)  Other Screening   Pap smear: Not documented   Pap smear action/deferral: Not indicated S/P hysterectomy  (06/17/2009)    Mammogram: No specific mammographic evidence of malignancy.    (01/19/2009)   Mammogram due: 01/2010    DXA bone density scan: Not documented   DXA bone density action/deferral: Ordered  (06/17/2009)   Smoking status: never  (12/07/2009)  Lipids   Total Cholesterol: 141  (12/25/2008)   LDL: 70  (12/25/2008)   LDL Direct: Not documented   HDL: 51  (12/25/2008)   Triglycerides: 98  (12/25/2008)  Hypertension   Last Blood Pressure: 111 / 68  (12/07/2009)   Serum creatinine: 1.01  (06/17/2009)   BMP action: Ordered   Serum potassium 4.5  (06/17/2009) CMP ordered   Self-Management Support :   Personal Goals (by the next clinic visit) :      Personal blood pressure goal: 140/90  (06/17/2009)   Patient will work on the following items until the next clinic visit to reach self-care goals:     Medications and monitoring: take my medicines every day, bring all of my medications to every visit  (12/07/2009)     Eating: eat more vegetables, use fresh or frozen vegetables, eat foods that are low in salt, eat fruit for snacks and desserts, limit or avoid alcohol  (12/07/2009)    Hypertension self-management support: Education handout, Written self-care plan, Resources for patients handout  (12/07/2009)   Hypertension self-care plan printed.   Hypertension education handout printed      Resource handout printed.

## 2010-12-06 ENCOUNTER — Encounter: Payer: Self-pay | Admitting: Internal Medicine

## 2010-12-06 ENCOUNTER — Ambulatory Visit (INDEPENDENT_AMBULATORY_CARE_PROVIDER_SITE_OTHER): Payer: Medicare Other | Admitting: Internal Medicine

## 2010-12-06 VITALS — BP 105/72 | HR 118 | Temp 98.7°F | Ht 64.0 in | Wt 161.5 lb

## 2010-12-06 DIAGNOSIS — R159 Full incontinence of feces: Secondary | ICD-10-CM | POA: Insufficient documentation

## 2010-12-06 DIAGNOSIS — D649 Anemia, unspecified: Secondary | ICD-10-CM

## 2010-12-06 LAB — CBC
HCT: 39.1 % (ref 36.0–46.0)
Hemoglobin: 12.4 g/dL (ref 12.0–15.0)
RDW: 13.3 % (ref 11.5–15.5)
WBC: 7.1 10*3/uL (ref 4.0–10.5)

## 2010-12-06 NOTE — Assessment & Plan Note (Signed)
As per HPi, [name] is having fecal incontinence which is not associated with her low back pain. Considering the hx, its most likely due to aggravation of constipation due to increase iron dose and so D/C'd FeSo4 today. Also offered her to have an referral to see Dr. Loreta Ave, but as her incontinence got better with self decreasing the dose of FeSo4, she wants to wait and see how stopping it helps her. Also advised her to call the clinic if she continues to have this problem, to make an early appointment or to get a GI referal. Also advised her to follow high fiber diet which will help her for constipation( hemorrhoids,TCA and FeSo4 all cumulatively worseing her constipation.) Please reassess her need to take all her medicines during next visit, in order to help her constipation.

## 2010-12-06 NOTE — Assessment & Plan Note (Signed)
Will check CBC today. Considering the increase in dose of iron as an starting point of her fecal incontinence, constipation is most likely cause, taking into account her internal hemorrhoids too. So will D/C iron today and can reconsider starting it at next visit if felt needed after the lab tests.

## 2010-12-06 NOTE — Progress Notes (Signed)
  Subjective:    Patient ID: [name], female    DOB: [birth year], 77 y.o.   MRN: [id number]  HPI [name] is a 77 yo woman with PMH of HTN, HLD, Internal Hemorrhoids , Iron Deficiency Anemia, who comes to the clinic today with CC of fecal incontinence for about 2-3 months now.  She states that she was constipated and had to take miralax powder, but after she was started on FeSo4 TID instead of her usual dose of BID, she started having this fecal incontinence, which is all day long and she notices watery stool in her panties all day. She started taking 2 FeSo4 tablets since Friday and reports that her stool incontinence is getting better. She also c/o low back p[ain which she is having for a long time now. She denies any incontinence of urine or any sensation changes in groin. She denies any diarrhea, blood in stool or constipation. Also denies any chest pain, abd pain, SOB , cough, fever.   Review of Systems  Constitutional: Negative for chills, diaphoresis and appetite change.  HENT: Negative.   Eyes: Negative.   Respiratory: Negative for apnea, cough, chest tightness and shortness of breath.   Cardiovascular: Negative.   Gastrointestinal: Negative.   Genitourinary: Negative for dysuria, frequency and difficulty urinating.  Musculoskeletal: Positive for back pain.  Neurological: Negative for dizziness, light-headedness and headaches.  Psychiatric/Behavioral: Negative.        Objective:   Physical Exam  Constitutional: She is oriented to person, place, and time. She appears well-developed and well-nourished.  HENT:  Head: Normocephalic and atraumatic.  Eyes: Conjunctivae and EOM are normal. Pupils are equal, round, and reactive to light.  Neck: Normal range of motion. Neck supple. No JVD present. No thyromegaly present.  Cardiovascular: Regular rhythm.  Exam reveals no gallop and no friction rub.   No murmur heard.      Tachycardic  Pulmonary/Chest: Effort normal and breath sounds  normal. No respiratory distress.  Abdominal: Soft. Bowel sounds are normal. She exhibits no distension. There is no tenderness. There is no rebound.  Neurological: She is alert and oriented to person, place, and time. No cranial nerve deficit.  Skin: Skin is warm and dry. No erythema.          Assessment & Plan:

## 2010-12-06 NOTE — Patient Instructions (Addendum)
Please make an follow up appointment after a month. Also stop taking your Iron tablet for now and if your stool problem still continues, please call us to make an appointment. You will need to be seen by Dr. Loreta Ave if your stool problem continues.

## 2010-12-08 ENCOUNTER — Encounter: Payer: Self-pay | Admitting: Internal Medicine

## 2010-12-31 ENCOUNTER — Encounter: Payer: Self-pay | Admitting: Internal Medicine

## 2011-01-03 ENCOUNTER — Encounter: Payer: Self-pay | Admitting: Internal Medicine

## 2011-01-03 DIAGNOSIS — F329 Major depressive disorder, single episode, unspecified: Secondary | ICD-10-CM

## 2011-01-07 ENCOUNTER — Ambulatory Visit (INDEPENDENT_AMBULATORY_CARE_PROVIDER_SITE_OTHER): Payer: Medicare Other | Admitting: Internal Medicine

## 2011-01-07 ENCOUNTER — Inpatient Hospital Stay (HOSPITAL_COMMUNITY)
Admission: AD | Admit: 2011-01-07 | Discharge: 2011-01-11 | DRG: 812 | Disposition: A | Discharge status: Home / Self Care | Payer: Medicare Other | Source: Ambulatory Visit | Attending: Infectious Diseases | Admitting: Infectious Diseases

## 2011-01-07 ENCOUNTER — Ambulatory Visit (HOSPITAL_COMMUNITY)
Admission: RE | Admit: 2011-01-07 | Discharge: 2011-01-07 | Disposition: A | Discharge status: Home / Self Care | Payer: Medicare Other | Source: Ambulatory Visit | Attending: Internal Medicine | Admitting: Internal Medicine

## 2011-01-07 ENCOUNTER — Encounter: Payer: Self-pay | Admitting: Ophthalmology

## 2011-01-07 ENCOUNTER — Encounter: Payer: Self-pay | Admitting: Internal Medicine

## 2011-01-07 VITALS — BP 94/64 | HR 134 | Temp 97.1°F | Ht 62.5 in | Wt 158.4 lb

## 2011-01-07 DIAGNOSIS — M545 Low back pain, unspecified: Secondary | ICD-10-CM | POA: Diagnosis present

## 2011-01-07 DIAGNOSIS — D638 Anemia in other chronic diseases classified elsewhere: Secondary | ICD-10-CM | POA: Diagnosis present

## 2011-01-07 DIAGNOSIS — R000 Tachycardia, unspecified: Secondary | ICD-10-CM

## 2011-01-07 DIAGNOSIS — F329 Major depressive disorder, single episode, unspecified: Secondary | ICD-10-CM | POA: Diagnosis present

## 2011-01-07 DIAGNOSIS — K921 Melena: Secondary | ICD-10-CM | POA: Diagnosis present

## 2011-01-07 DIAGNOSIS — D72829 Elevated white blood cell count, unspecified: Secondary | ICD-10-CM | POA: Diagnosis present

## 2011-01-07 DIAGNOSIS — D509 Iron deficiency anemia, unspecified: Principal | ICD-10-CM | POA: Diagnosis present

## 2011-01-07 DIAGNOSIS — I4729 Other ventricular tachycardia: Secondary | ICD-10-CM | POA: Diagnosis present

## 2011-01-07 DIAGNOSIS — Z860100 Personal history of colon polyps, unspecified: Secondary | ICD-10-CM

## 2011-01-07 DIAGNOSIS — Z981 Arthrodesis status: Secondary | ICD-10-CM

## 2011-01-07 DIAGNOSIS — I959 Hypotension, unspecified: Secondary | ICD-10-CM | POA: Diagnosis present

## 2011-01-07 DIAGNOSIS — R609 Edema, unspecified: Secondary | ICD-10-CM | POA: Diagnosis present

## 2011-01-07 DIAGNOSIS — I498 Other specified cardiac arrhythmias: Secondary | ICD-10-CM

## 2011-01-07 DIAGNOSIS — G8929 Other chronic pain: Secondary | ICD-10-CM | POA: Diagnosis present

## 2011-01-07 DIAGNOSIS — K5909 Other constipation: Secondary | ICD-10-CM | POA: Diagnosis present

## 2011-01-07 DIAGNOSIS — I10 Essential (primary) hypertension: Secondary | ICD-10-CM

## 2011-01-07 DIAGNOSIS — I471 Supraventricular tachycardia: Secondary | ICD-10-CM | POA: Insufficient documentation

## 2011-01-07 DIAGNOSIS — I472 Ventricular tachycardia, unspecified: Secondary | ICD-10-CM | POA: Diagnosis present

## 2011-01-07 DIAGNOSIS — F3289 Other specified depressive episodes: Secondary | ICD-10-CM | POA: Diagnosis present

## 2011-01-07 DIAGNOSIS — G2581 Restless legs syndrome: Secondary | ICD-10-CM | POA: Diagnosis present

## 2011-01-07 LAB — CARDIAC PANEL(CRET KIN+CKTOT+MB+TROPI)
CK, MB: 3.2 ng/mL (ref 0.3–4.0)
Relative Index: INVALID (ref 0.0–2.5)
Total CK: 56 U/L (ref 7–177)
Troponin I: 0.04 ng/mL (ref 0.00–0.06)

## 2011-01-07 LAB — CBC
Hemoglobin: 9.6 g/dL — ABNORMAL LOW (ref 12.0–15.0)
MCH: 29.9 pg (ref 26.0–34.0)
MCV: 88.5 fL (ref 78.0–100.0)
RBC: 3.21 MIL/uL — ABNORMAL LOW (ref 3.87–5.11)
WBC: 17.9 10*3/uL — ABNORMAL HIGH (ref 4.0–10.5)

## 2011-01-07 LAB — COMPREHENSIVE METABOLIC PANEL
AST: 60 U/L — ABNORMAL HIGH (ref 0–37)
CO2: 20 mEq/L (ref 19–32)
Calcium: 8 mg/dL — ABNORMAL LOW (ref 8.4–10.5)
Creatinine, Ser: 1.19 mg/dL (ref 0.4–1.2)
GFR calc Af Amer: 52 mL/min — ABNORMAL LOW (ref >60)
GFR calc non Af Amer: 43 mL/min — ABNORMAL LOW (ref >60)

## 2011-01-07 NOTE — Progress Notes (Signed)
Attempted to start IV; unsuccessful stick.  Poor venous access; IV team called per [facility].

## 2011-01-07 NOTE — Assessment & Plan Note (Addendum)
Orthostatics positive. May be 2/2 dehydration. Patient denies urinary frequency, hemoptysis, hematochezia, melena, vomiting or diarrhea. Will rule out anemia, and thyroid disease as cause. Patient will be admitted by primary team for further evaluation.

## 2011-01-07 NOTE — H&P (Signed)
Hospital Admission Note Date: 01/07/2011  Patient name: [name] Medical record number: [id number] Date of birth: 1925-01-31 Age: 77 y.o. Gender: female PCP: Almyra Deforest, MD  Medical Service:  Attending physician: Dr. Phillips Odor Resident [phone]): Dr. Baltazar Apo   Pager:[phone] Resident (R1): Dr. Cathey Endow    Pager:[phone]  Chief Complaint: Abnormal EKG  History of Present Illness: This is a 77 year old female with a past medical history significant for hypertension, and depression who presented to the clinic in her normal state of health for a follow up visit.  The patient had no significant concerns today.  When further questioned, the patient admitted to prominently a 2 week hx of palpitations that come and go.  The pt was unable to specify the frequency but denied any concurrent CP or SOB.  The patient also denies any orthopnea or PND.  The patient endorses recent fatigue and occasional dizziness when standing but states that this has been ongoing for years. The patient denies any heat/cold intolerance, or any fevers, chills or changes in her bowel movements. The patient also denies any recent lower extremity edema.  During her office visit, the patients pulse was found to be 134 with a depressed BP, and there was concern for atrial flutter on EKG which only showed sinus tachycardia.  At the time of admission the patients HR had slowed to 74 and repeat EKG demonstrated possible new ST depression in the lateral leads.    Current Outpatient Prescriptions . acetaminophen (TYLENOL 8 HOUR) 650 MG CR tablet Take 650 mg by mouth 3 (three) times daily.       Marland Kitchen amitriptyline (ELAVIL) 25 MG tablet Take 25 mg by mouth daily.       . Emollient (EUCERIN PLUS) 5-5 % LOTN Apply topically 2 (two) times daily. To affected skin area      . furosemide (LASIX) 20 MG tablet Take 20 mg by mouth every morning.       . gabapentin (NEURONTIN) 300 MG capsule Take 300 mg by mouth 3 (three) times daily.       Marland Kitchen lidocaine  (LIDODERM) 5 % Apply to your back for pain. Each patch last 12 hrs; then 12 hrs off.      . loratadine (ALAVERT) 10 MG tablet Take 10 mg by mouth daily.       . metoprolol (LOPRESSOR) 50 MG tablet Take 50 mg by mouth daily.       . mirtazapine (REMERON) 15 MG tablet Take 7.5 mg by mouth at bedtime.       . polyethylene glycol (MIRALAX) powder Take 17 g by mouth. Take with 8 oz of juice or water 2 times a day until bowel movement achieved, then once a day        Allergies: Ibuprophen  Past Medical History . Hypertension  . Head ache  . Renal insufficiency, mild - now resolved  . Depression  . Restless leg syndrome    Hx Bilateral LE edema  Chronic low back pain sp L3-4 posterior lumbar interbody fusion utilizing Tangent wedges    Family History: Unknown   Social History Widowed, husband killed in Electronics engineer war. Has 1 child.  Originally from [place]. She has been here in the [place] since the 60's. Patient denies tobacco, alcohol, or drug use. Previously worked in a Pharmacologist and as a Tree surgeon.   Review of Systems: Negative as per HPI.   Physical Exam: VITALS: P: 134  BP: 94/64 Repeat at time of admission: HR: 77, BP 115/83  PHYSICAL EXAM: General:  alert, well-developed, and cooperative to examination.   Head:  normocephalic and atraumatic.   Eyes:  vision grossly intact, pupils equal, pupils round, pupils reactive to light, no injection and anicteric.   Mouth:  pharynx pink and moist, no erythema, and no exudates.   Neck:  supple, full ROM, no thyromegaly, no JVD Lungs:  normal respiratory effort, no accessory muscle use, normal breath sounds, no crackles, and no wheezes.  Heart:  normal rate, regular rhythm, no murmur, no gallop, and no rub.   Abdomen:  soft, non-tender, normal bowel sounds, no distention, no guarding, no rebound tenderness, no hepatomegaly, and no splenomegaly.   Msk:  no joint swelling, no joint warmth, and no redness over joints.   Pulses:  2+ DP/PT pulses  bilaterally Extremities:  No cyanosis, clubbing, edema  Neurologic:  alert & oriented X3, cranial nerves II-XII intact, strength normal in all extremities, sensation intact to light touch.  Skin:  turgor normal and no rashes.   Psych:  Oriented X3, memory intact for recent and remote, normally interactive, good eye contact, not anxious appearing, and not depressed appearing.  EKG: Initial EKG demonstrated HR 133, sinus tachycardia, t-wave inversion in lateral leads, and left ventricular hypertrophy.  Repeat EKG: HR 77, LVH, and st depression/t wave inversion in the lateral leads.   Assessment & Plan by Problem:  This is a 77 year old female with hx of hypertension who presents from the [facility] with paroxysmal sinus tachycardia and  EKG signs concerning for silent ischemia.   1. ST depression w/T-wave inversions in the lateral leads: These could be old EKG changes but there are no prior EKG's available in the EMR for comparison.  Given the patients age, we will admit to telemetry to RO ischemia though I have an very low suspicion for ACS.  There is some concern over possible structural heart disease as previous echocardiogram done in 2004 was completely normal with EF of 55-60%, now with EKG signs of LVH.  Will check lipid panel, and A1C for risk stratification. Will also repeat EKG in the AM and repeat 2D echo.   2. Sinus Tachycardia: This is likely the cause of the patients new onset palpitations.  At the time of admission, this had resolved.  Pt is on metoprolol 50mg  qd and vagal maneuver was performed in the clinic. The attending of the clinic was concerned that the pts tachycardia may be secondary to atrial flutter but there was no sign of this once her HR and slowed. Given the new LVH, the patient may have silent ischemia causing her abnormal rhythm.  At this point, I will increase the patients metoprolol to 50 bid and continue to monitor on tele.  If the pt's abnormal rhythm resumes, she may  require an anti-rhythmic.    3. Hypertension: Patient is now only slightly below baseline.  At this point in time, I will continue her home medications but will consider decreasing the patients lasix if her BP is greatly affected by the increase in her metoprolol dose.   4.Depression: Stable.  5 PPX: Lovenox.

## 2011-01-07 NOTE — Assessment & Plan Note (Signed)
Will stop antihypertensives.

## 2011-01-07 NOTE — Progress Notes (Signed)
  Subjective:    Patient ID: [name], female    DOB: Apr 16, 1925, 77 y.o.   MRN: [id number]  HPI  77 yr old woman with  Past Medical History  Diagnosis Date  . Hypertension   . Head ache   . Renal insufficiency, mild   . Depression   . Fecal occult blood test positive   . Restless leg syndrome   . Anemia   . Cataracts, bilateral   . Hemorrhoids   . Rupture of rotator cuff of shoulder     s/p repair by Dr. Luiz Blare 7/07  . Lumbar spinal stenosis   . Postnasal drip     comes to the clinic for regular check up of chronic medical problems. Patient reports that since changing Iron to bid she has regular bowel movements. Denies lightheadedness, dizziness, chest pain, palpitations, diaphoresis, shortness of breath, diarrhea, hematochezia, melena, nausea, vomiting, or abdominal pain.  Review of Systems  All other systems reviewed and are negative.       Objective:   Physical Exam  Constitutional: She is oriented to person, place, and time.  HENT:  Mouth/Throat: Oropharynx is clear and moist.  Eyes: Conjunctivae and EOM are normal. Pupils are equal, round, and reactive to light.  Neck: Normal range of motion. Neck supple.  Cardiovascular: Regular rhythm.        tachy  Pulmonary/Chest: Effort normal and breath sounds normal.  Abdominal: Soft. Bowel sounds are normal.  Neurological: She is alert and oriented to person, place, and time.  Psychiatric: She has a normal mood and affect.          Assessment & Plan:

## 2011-01-08 DIAGNOSIS — I4892 Unspecified atrial flutter: Secondary | ICD-10-CM

## 2011-01-08 LAB — LIPID PANEL
Cholesterol: 116 mg/dL (ref 0–200)
Total CHOL/HDL Ratio: 2.7 RATIO
VLDL: 38 mg/dL (ref 0–40)

## 2011-01-08 LAB — CARDIAC PANEL(CRET KIN+CKTOT+MB+TROPI)
Relative Index: INVALID (ref 0.0–2.5)
Relative Index: INVALID (ref 0.0–2.5)
Total CK: 44 U/L (ref 7–177)
Troponin I: 0.02 ng/mL (ref 0.00–0.06)

## 2011-01-08 LAB — IRON AND TIBC
Iron: 48 ug/dL (ref 42–135)
Saturation Ratios: 16 % — ABNORMAL LOW (ref 20–55)
TIBC: 298 ug/dL (ref 250–470)
UIBC: 250 ug/dL

## 2011-01-08 LAB — HEMOGLOBIN A1C: Mean Plasma Glucose: 126 mg/dL — ABNORMAL HIGH (ref <117)

## 2011-01-09 DIAGNOSIS — I369 Nonrheumatic tricuspid valve disorder, unspecified: Secondary | ICD-10-CM

## 2011-01-09 LAB — CBC
Platelets: 314 10*3/uL (ref 150–400)
RDW: 14.2 % (ref 11.5–15.5)
WBC: 13 10*3/uL — ABNORMAL HIGH (ref 4.0–10.5)

## 2011-01-09 LAB — BASIC METABOLIC PANEL
GFR calc Af Amer: 55 mL/min — ABNORMAL LOW (ref >60)
GFR calc non Af Amer: 46 mL/min — ABNORMAL LOW (ref >60)
Potassium: 4.1 mEq/L (ref 3.5–5.1)
Sodium: 138 mEq/L (ref 135–145)

## 2011-01-10 DIAGNOSIS — I4892 Unspecified atrial flutter: Secondary | ICD-10-CM

## 2011-01-10 LAB — URINALYSIS, ROUTINE W REFLEX MICROSCOPIC
Bilirubin Urine: NEGATIVE
Nitrite: NEGATIVE
Specific Gravity, Urine: 1.012 (ref 1.005–1.030)
pH: 6.5 (ref 5.0–8.0)

## 2011-01-10 LAB — BASIC METABOLIC PANEL
BUN: 24 mg/dL — ABNORMAL HIGH (ref 6–23)
Chloride: 109 mEq/L (ref 96–112)
Potassium: 4.1 mEq/L (ref 3.5–5.1)
Sodium: 141 mEq/L (ref 135–145)

## 2011-01-10 LAB — CBC
HCT: 24.7 % — ABNORMAL LOW (ref 36.0–46.0)
MCV: 91.1 fL (ref 78.0–100.0)
Platelets: 336 10*3/uL (ref 150–400)
RBC: 2.71 MIL/uL — ABNORMAL LOW (ref 3.87–5.11)
WBC: 17.6 10*3/uL — ABNORMAL HIGH (ref 4.0–10.5)

## 2011-01-10 LAB — DIFFERENTIAL
Lymphocytes Relative: 32 % (ref 12–46)
Lymphs Abs: 5.6 10*3/uL — ABNORMAL HIGH (ref 0.7–4.0)
Neutrophils Relative %: 58 % (ref 43–77)

## 2011-01-10 LAB — HEMOCCULT GUIAC POC 1CARD (OFFICE): Fecal Occult Bld: POSITIVE

## 2011-01-11 LAB — CBC
Hemoglobin: 8.5 g/dL — ABNORMAL LOW (ref 12.0–15.0)
MCH: 30 pg (ref 26.0–34.0)
Platelets: 250 10*3/uL (ref 150–400)
RBC: 2.83 MIL/uL — ABNORMAL LOW (ref 3.87–5.11)
WBC: 16.9 10*3/uL — ABNORMAL HIGH (ref 4.0–10.5)

## 2011-01-11 LAB — BASIC METABOLIC PANEL
CO2: 19 mEq/L (ref 19–32)
Chloride: 117 mEq/L — ABNORMAL HIGH (ref 96–112)
GFR calc Af Amer: >60 mL/min (ref >60)
Potassium: 4.5 mEq/L (ref 3.5–5.1)
Sodium: 141 mEq/L (ref 135–145)

## 2011-01-11 LAB — RETICULOCYTES
RBC.: 2.71 MIL/uL — ABNORMAL LOW (ref 3.87–5.11)
Retic Count, Absolute: 100.3 10*3/uL (ref 19.0–186.0)

## 2011-01-11 LAB — TECHNOLOGIST SMEAR REVIEW

## 2011-01-11 NOTE — Discharge Summary (Signed)
Ms. Harned was admitted for tachycardia and hypotension from the clinic. She was found to have anemia(hb 9.6--- D/C Hb-8.5) and leukocytosis( D/C WBC-16.7)-- but U/A and Blood Cx negative, no signs of PNA or any other infective or significant inflammatory process. She had some SVT and NSVT episodes during the hospital stay- which were asymptomatic. She is to be D/C'd home with the medications as per D/C summary-- with new medication added- omeprazole. We needed to evaluate for her Anemia- for which Anemia panel was done but was not definitely conclusive. So after long discussion, we ordered the following tests- Retic count- corrected 2.7%--- normal BM response and peripheral smear showing polychromasia.  SPEP is pending at time of D/C. She had a FOBT positive this admission on 01/10/11, so needs and Colonoscopy referral- which is on 01/20/11 10.15 am.with [facility]. She has an f/u appt with Dr. Sherryll Burger at [facility] on 01/25/11 at 10.15 am. Please check a CBC, BMET and also evaluate for her SOB. We increased her dose of FeSo4 to TID and she was worried about constipation- so please evaluate for that and adjust for laxatives as needed.

## 2011-01-13 LAB — PROTEIN ELECTROPHORESIS, SERUM
Albumin ELP: 45.6 % — ABNORMAL LOW (ref 55.8–66.1)
Beta 2: 6.9 % — ABNORMAL HIGH (ref 3.2–6.5)
Total Protein ELP: 5.9 g/dL — ABNORMAL LOW (ref 6.0–8.3)

## 2011-01-16 LAB — CULTURE, BLOOD (ROUTINE X 2)
Culture  Setup Time: 201204021740
Culture: NO GROWTH

## 2011-01-20 ENCOUNTER — Ambulatory Visit: Payer: Medicare Other | Admitting: Internal Medicine

## 2011-01-21 ENCOUNTER — Other Ambulatory Visit (HOSPITAL_COMMUNITY): Payer: Self-pay | Admitting: Internal Medicine

## 2011-01-21 DIAGNOSIS — Z1231 Encounter for screening mammogram for malignant neoplasm of breast: Secondary | ICD-10-CM

## 2011-01-24 NOTE — Discharge Summary (Signed)
[name], [name]                  ACCOUNT NO.:  [id number]  MEDICAL RECORD NO.:  [id number]           PATIENT TYPE:  I  LOCATION:  3736                         FACILITY:  [facility]  PHYSICIAN:  Fransisco Hertz, M.D.  DATE OF BIRTH:  1925/02/03  DATE OF ADMISSION:  01/07/2011 DATE OF DISCHARGE:  01/11/2011                              DISCHARGE SUMMARY   PRIMARY CARE PHYSICIAN:  Almyra Deforest, MD, with [facility].  PRIMARY GASTROENTEROLOGIST:  Anselmo Rod, MD, [facility] with [facility].  DISCHARGE DIAGNOSES: 1. Tachycardia.  The patient the patient was admitted for tachycardia     at a rate of 130 which was sinus tachydardia at admission.  She did     not have any evidence of atrial flutter or fibrillation during this     hospitalization most probably likely because of her anemia and     hypotension. 2. Anemia with baseline hemoglobin of 10 secondary to GI blood loss     and anemia of chronic disease. 3. Chronic constipation. 4. Leukocytosis.  The patient was discharged with a white count of     16.7 without any evidence of infection, blood cultures pending at     the time of discharge. 5. Peripheral edema.  The patient was on Lasix prior to admission     which was discontinued. 6. Chronic T-wave inversions diffusely on EKG.  No change during this     admission. 7. Hypertension. 8. Depression. 9. Restless legs syndrome. 10.Chronic low back pain status post L3-L4 posterior lumbar interbody     fusion.  DISCHARGE MEDICATIONS: 1. Ferrous sulfate 325 mg 1 tablet by mouth three times a day. 2. Metoprolol 25 mg twice a day. 3. Omeprazole 40 mg 1 tablet by mouth daily. 4. Tylenol 650 mg 1 tablet by mouth up to 3 times a day as needed for     pain. 5. Elavil 25 mg 1 tablet by mouth daily. 6. [place] 5% lotion twice daily on the affected area. 7. Neurontin 300 mg 1 tablet by mouth 3 times a day. 8. Lidoderm 5% patch transdermally every 12  hours. 9. Loratadine 10 mg 1 tablet by mouth daily. 10.Mirtazapine 15 mg 1/2 tablet by mouth daily at bedtime. 11.MiraLax 17 g twice daily as needed for constipation.  The patient should stop taking the following medications. Lasix 20 mg 1 tablet daily.  DISPOSITION AND FOLLOWUP:  The patient is discharged from [facility] in stable condition.  She is to be followed up by Dr. Charna Elizabeth on January 12, 2011, for colonoscopy for her anemia and heme-positive stools.  She is also to be followed up in [place], her appointment is with Dr. Clerance Lav, on January 25, 2011, at 10:15 a.m. at that time following issues needs to be checked upon. 1. Anemia.  Make sure the patient has had her colonoscopy done and if     any source of bleeding was identified, also the patient had SPEP     ordered during this hospitalization.  The results of which are  pending at the time of discharge. 2. Leukocytosis.  The patient was discharged with a high white count     as dictated above without any evidence of infection.  Please follow     up on that white count to see if it is trending down.  She had     blood cultures ordered during the hospitalization, preliminary     results are negative at this time, please follow up on the final     blood culture results during the outpatient clinic visit. 3. Tachycardia.  The patient was admitted for tachycardia, but no     arrhythmias were detected during the telemetry monitoring in the     hospital.  If needed, her Lopressor dose can be increased for     symptomatic tachycardia, if her blood pressure tolerates.  She is     currently on 25 mg twice a day at the time of discharge. 4. Constipation.  The patient has chronic constipation and her dose of     iron has been increased in the hospital.  Please make sure her     constipation is not worse with this change.  ADMITTING HISTORY AND PHYSICAL:  An 77 year old woman with past  medical history dictated above who presented to the clinic in a normal state of health for followup visit.  The patient had no significant concerns during that visit when further questioned, the patient is admitted to a complaint of 2 weeks of intermittent palpitations that were aggravated by exertion.  She denied any concurrent chest pain or shortness of breath.  She denied any orthopnea or PND.  She endorses fatigue and occasional dizziness when standing, but states that this has been ongoing for years.  She denied any heat or cold intolerance, any fever, chills, or changes in her bowel movements.  She also denied any worsening lower extremity edema.  Her pulse was found to be 134, with a blood pressure of low 100s in the clinic and with the concern of atrial flutter on EKG with T-wave inversions, so she was admitted to the hospital for further evaluation of these abnormalities.  PHYSICAL EXAM AT ADMISSION:  VITAL SIGNS:  Temperature afebrile, pulse 134, blood pressure 94/64, oxygen saturation 100% on room air. GENERAL:  The patient was alert, no acute distress. HEENT:  Head, normocephalic and atraumatic. NECK:  Supple.  No JVD. LUNGS:  Clear to auscultation bilaterally. HEART:  Tachycardia.  No murmurs.  No abnormal heart sounds. ABDOMEN:  Soft, nontender, nondistended.  Normal bowel sounds.  Pulses 2+ bilaterally. EXTREMITIES:  No cyanosis, clubbing, or edema. NEUROLOGICAL:  Alert and oriented x3.  Cranial nerves II-XII intact. Strength normal in all extremities.  Sensation intact to light touch.  PROCEDURES PERFORMED: 1. Transthoracic echocardiogram: EF 55-60%, on regional wall motion abnormality 2. The patient had serial EKGs during this admission which showed     normal heart rate, persistent T-wave inversions in anterior and     lateral leads without any ST-T wave changes.  HOSPITAL COURSE BY PROBLEM: 1. Tachycardia.  The patient was admitted with tachycardia from the      Outpatient Clinic.  The initial concern was that patient has atrial     flutter, but we did not see any evidence of any atrial arrhythmia     during the telemetry monitoring during this hospitalization.  Her     cardiac enzymes were negative x3.  TSH was unremarkable.  Her 2-D     echo is pending at  the time of discharge.  The patient had a soft     blood pressure during this admission and her hemoglobin was about     8, so we suspect that her tachycardia might be precipitated by     anemia and hypotension, so we have tried to work this up during     this hospitalization.  Also the patient is on Lopressor 25 mg twice     a day, this is the dose her blood pressure tolerated during this     hospitalization.  If she continues to have tachycardia during her     outpatient clinics, her Lopressor dose can be increased if it is     symptomatic and if her blood pressure tolerates.  She did have one     episode of nonsustained narrow complex ventricular tachycardia     during this hospitalization with a rate of 150 per minute and that     was when she was trying to move around in the room, otherwise the     patient did not show any signs of tachycardia during this     hospitalization. 2. Anemia.  The patient presented with a hemoglobin of 9.6.  on the     day 2 of hospitalization, her hemoglobin dropped down to 8.1.  At     this time, we got an anemia panel which showed a ferritin of 79     with total iron of 48 and TIBC of 298 with a normal reticulocyte     count, and a low RBC of 2.71.  Also her stools were positive for     occult blood suggestive of combination of iron deficiency and     anemia of chronic disease.  The patient was taking iron prior to     admission, so we continued her iron tablets at the time of     discharge.  We have scheduled her for a colonoscopy with Dr. Loreta Ave     in a week.  She had a colonoscopy back in 2005, which did show some     polyps, nonbleeding internal  hemorrhoids, and diverticula any of     which could be bleeding right now to cause her current GI blood     loss.  The patient was started on a PPI prior to discharge given     her history of gastritis.  Also we have tried to work her anemia     little bit more with SPEP, serum protein electrophoresis which is     pending at the time of discharge and would need to be followed up     as an outpatient. 3. Leukocytosis.  The patient had a persistent elevated white count     during this hospitalization.  The highest number we noted during     this hospitalization was 7.6 with an ANC of 10.2, but this has been     trending down prior to discharge.  We did not find any evidence of     infection in her urine or skin, lungs or anywhere else and we have     discharged her without any treatment for this.  We have obtained     blood cultures which are pending at the time of discharge and will     need to be followed up by the physician in the Outpatient Clinic     along with her CBC to see if her white count is trending down.  She  has been afebrile during this hospitalization. 4. Peripheral edema.  The patient has chronic venous insufficiency and     she has complains of occasional edema of her legs.  She was on 20     mg of Lasix every day for this for years given her soft blood     pressure and occasional hypertension, we stopped her Lasix during     this admission.  All her other medical problems have been stable and we have not changed her other medication during this hospitalization.  DISCHARGE VITALS:  Temperature 98.5, pulse 77, respirations 18, blood pressure 109/71, oxygen saturation 98% on room air.  DISCHARGE LABS:  White count of 16.9, hemoglobin of 8.5, platelet count of 250.  Sodium 141, potassium 4.5, chloride 107, bicarb 19, glucose 99, BUN 17, creatinine 1, calcium 7.5.  Fecal occult blood test positive. Reticulocyte count of 3.7% with an RBC of 2.71, and an  absolute reticulocyte of 100.3.  Anemia panel shows ferritin of 79, iron 48, TIBC 298, vitamin B12 348, serum folate 11.4, TSH 0.768.     Bethel Born, MD   ______________________________ Fransisco Hertz, M.D.    MD/MEDQ  D:  01/11/2011  T:  01/12/2011  Job:  [id number]  cc:   Anselmo Rod, MD, [facility]  Electronically Signed by Bethel Born  on 01/18/2011 12:21:36 PM Electronically Signed by Lina Sayre M.D. on 01/24/2011 12:38:48 PM

## 2011-01-25 ENCOUNTER — Ambulatory Visit (INDEPENDENT_AMBULATORY_CARE_PROVIDER_SITE_OTHER): Payer: Medicare Other | Admitting: Internal Medicine

## 2011-01-25 ENCOUNTER — Encounter: Payer: Self-pay | Admitting: Internal Medicine

## 2011-01-25 VITALS — BP 156/86 | HR 76 | Temp 99.1°F | Ht 60.0 in | Wt 164.8 lb

## 2011-01-25 DIAGNOSIS — R000 Tachycardia, unspecified: Secondary | ICD-10-CM

## 2011-01-25 DIAGNOSIS — I10 Essential (primary) hypertension: Secondary | ICD-10-CM

## 2011-01-25 DIAGNOSIS — N259 Disorder resulting from impaired renal tubular function, unspecified: Secondary | ICD-10-CM

## 2011-01-25 DIAGNOSIS — F329 Major depressive disorder, single episode, unspecified: Secondary | ICD-10-CM

## 2011-01-25 DIAGNOSIS — D649 Anemia, unspecified: Secondary | ICD-10-CM

## 2011-01-25 DIAGNOSIS — I498 Other specified cardiac arrhythmias: Secondary | ICD-10-CM

## 2011-01-25 NOTE — Assessment & Plan Note (Signed)
Majority of patient's extended family live in other towns and cities. She feels lonely sometimes but she has some support network. She is able to go 2-4 visits using the scat transport or help of family members. She does not having suicidal ideation at this time. No new medications were started she is continued on amitriptyline and Remeron

## 2011-01-25 NOTE — Assessment & Plan Note (Signed)
Serum creatinine of 1 last metabolic profile. Continue to monitor

## 2011-01-25 NOTE — Patient Instructions (Signed)
Continue current medications  Return in 3 months

## 2011-01-25 NOTE — Assessment & Plan Note (Signed)
Heart rate is well-controlled with the metoprolol. Continue to monitor.

## 2011-01-25 NOTE — Assessment & Plan Note (Signed)
Her pressure is well controlled. Continue current regimen.

## 2011-01-25 NOTE — Assessment & Plan Note (Signed)
Patient has had extensive workup in the past. Patient was diagnosed with an deficiency anemia around 2009. Each since then she has been supplemented with IM. Pressures were within normal limits while she was hospitalized. She is known to have a weakly positive stools. I were to colonoscopy as not to do any social bleeding. Her baseline hemoglobin was around 10-11 3 years ago. At the time of discharge from the hospital removal was only 8. She had serum protein electrophoresis we did not detect any M spike. She will had any urine protein electrophoresis. Her renal insufficiency is very minimal. She does not have hypercalcemia. Have not ovarian she ever received hemoglobin electrophoresis. Her reticulocyte count was elevated but not appropriately. She has been caring presumptive diagnosis of anemia of chronic disease. She has never been treated with Procrit.  I will repeat her hemoglobin today to see the trend. If her hemoglobin is down training she might need aggressive management. I also believe that she'll benefit from hematology referral. She may need hemoglobin electrophoresis. I will repeat iron studies today.

## 2011-01-25 NOTE — Progress Notes (Signed)
  Subjective:    Patient ID: [name], female    DOB: 06-28-1925, 77 y.o.   MRN: [id number]  HPI Ms. Stoffel is 77 year old female with asthma, history of sinus tachycardia , hypertension, anemia and spinal stenosis who presents here for followup. She was recently discharged from hospital on 01/12/2011.  Patient reports that she is than well since the shot. She's taking her medications as prescribed. She continues to have some constipation. She thinks it's mainly from iron pills. She wants to know if she needs to continue taking the pills. She denies any dizziness, blackouts and tachycardia. She says that she is very hard bowel movements and is able to pass stool with straining. This results in worsening of her hemorrhoids. She doesn't find blood per stool.  Patient is also wondering why she hasn't seen her primary care provider in a long time. She is known to complaint   Review of Systems  Constitutional: Positive for fatigue. Negative for fever, chills, diaphoresis, activity change and appetite change.  HENT: Negative for nosebleeds, congestion, neck pain and ear discharge.   Eyes: Negative.   Respiratory: Negative.   Cardiovascular: Negative.   Gastrointestinal: Negative.   Musculoskeletal: Negative.   Psychiatric/Behavioral: Negative.        Objective:   Physical Exam  Constitutional: She is oriented to person, place, and time. She appears well-developed and well-nourished. No distress.  HENT:  Head: Normocephalic and atraumatic.  Right Ear: External ear normal.  Left Ear: External ear normal.  Mouth/Throat: Oropharynx is clear and moist.  Eyes: Conjunctivae and EOM are normal. Pupils are equal, round, and reactive to light.  Neck: Normal range of motion. Neck supple.  Cardiovascular: Normal rate, regular rhythm and normal heart sounds.   No murmur heard. Pulmonary/Chest: Effort normal and breath sounds normal. No respiratory distress. She has no wheezes. She has no rales.    Abdominal: Soft. Bowel sounds are normal. She exhibits no distension. There is no tenderness. There is no rebound.  Musculoskeletal: Normal range of motion.  Neurological: She is alert and oriented to person, place, and time. She has normal reflexes.  Skin: Skin is warm. She is not diaphoretic.  Psychiatric: She has a normal mood and affect. Her behavior is normal. Thought content normal.          Assessment & Plan:

## 2011-01-26 LAB — IRON AND TIBC

## 2011-01-26 LAB — HEMOGLOBIN: Hemoglobin: 8.5 g/dL — ABNORMAL LOW (ref 12.0–15.0)

## 2011-02-02 ENCOUNTER — Encounter (HOSPITAL_BASED_OUTPATIENT_CLINIC_OR_DEPARTMENT_OTHER): Payer: Medicare Other | Admitting: Oncology

## 2011-02-02 ENCOUNTER — Other Ambulatory Visit: Payer: Self-pay | Admitting: Oncology

## 2011-02-02 DIAGNOSIS — D72829 Elevated white blood cell count, unspecified: Secondary | ICD-10-CM

## 2011-02-02 LAB — CBC WITH DIFFERENTIAL/PLATELET
Basophils Absolute: 0 10*3/uL (ref 0.0–0.1)
EOS%: 1.1 % (ref 0.0–7.0)
HGB: 10.2 g/dL — ABNORMAL LOW (ref 11.6–15.9)
LYMPH%: 39.2 % (ref 14.0–49.7)
MCH: 31.2 pg (ref 25.1–34.0)
MCV: 98.2 fL (ref 79.5–101.0)
MONO%: 11.3 % (ref 0.0–14.0)
NEUT%: 47.7 % (ref 38.4–76.8)
RDW: 18.7 % — ABNORMAL HIGH (ref 11.2–14.5)

## 2011-02-02 LAB — MORPHOLOGY

## 2011-02-03 LAB — LACTATE DEHYDROGENASE: LDH: 296 U/L — ABNORMAL HIGH (ref 94–250)

## 2011-02-03 LAB — COMPREHENSIVE METABOLIC PANEL
ALT: 31 U/L (ref 0–35)
CO2: 23 mEq/L (ref 19–32)
Calcium: 8.5 mg/dL (ref 8.4–10.5)
Chloride: 107 mEq/L (ref 96–112)
Potassium: 4.8 mEq/L (ref 3.5–5.3)
Sodium: 140 mEq/L (ref 135–145)
Total Protein: 7 g/dL (ref 6.0–8.3)

## 2011-02-11 ENCOUNTER — Other Ambulatory Visit (INDEPENDENT_AMBULATORY_CARE_PROVIDER_SITE_OTHER): Payer: Medicare Other | Admitting: *Deleted

## 2011-02-11 DIAGNOSIS — I10 Essential (primary) hypertension: Secondary | ICD-10-CM

## 2011-02-14 MED ORDER — METOPROLOL TARTRATE 50 MG PO TABS: 25.0000 mg | ORAL_TABLET | Freq: Every day | ORAL | Status: DC

## 2011-02-15 ENCOUNTER — Ambulatory Visit (HOSPITAL_COMMUNITY)
Admission: RE | Admit: 2011-02-15 | Discharge: 2011-02-15 | Disposition: A | Discharge status: Home / Self Care | Payer: Medicare Other | Source: Ambulatory Visit | Attending: Internal Medicine | Admitting: Internal Medicine

## 2011-02-15 DIAGNOSIS — Z1231 Encounter for screening mammogram for malignant neoplasm of breast: Secondary | ICD-10-CM | POA: Insufficient documentation

## 2011-02-25 NOTE — Op Note (Signed)
[name], [name]                  ACCOUNT NO.:  [id number]   MEDICAL RECORD NO.:  [id number]          PATIENT TYPE:  AMB   LOCATION:  ENDO                         FACILITY:  [facility]   PHYSICIAN:  Anselmo Rod, M.D.  DATE OF BIRTH:  [birth year]   DATE OF PROCEDURE:  02/21/2005  DATE OF DISCHARGE:                                 OPERATIVE REPORT   PROCEDURE PERFORMED:  Esophagogastroduodenoscopy with antral biopsies.   ENDOSCOPIST:  Charna Elizabeth, M.D.   INSTRUMENT USED:  Olympus video panendoscope.   INDICATIONS FOR PROCEDURE:  The patient is a 77 year old African-American  female with history of guaiac positive stool and abdominal pain, rule out  peptic ulcer disease, esophagitis, gastritis, etc.   PREPROCEDURE PREPARATION:  Informed consent was procured from the patient.  The patient was fasted for eight hours prior to the procedure.   PREPROCEDURE PHYSICAL:  The patient had stable vital signs.  Neck supple,  chest clear to auscultation.  S1, S2 regular.  Abdomen soft with normal  bowel sounds.   DESCRIPTION OF PROCEDURE:  The patient was placed in the left lateral  decubitus position and sedated with 30 mg of Demerol and 3 mg of Versed in  slow incremental doses.  Once the patient was adequately sedated and  maintained on low-flow oxygen and continuous cardiac monitoring, the Olympus  video panendoscope was advanced through the mouth piece over the tongue into  the esophagus under direct vision.  The entire esophagus and proximal small  bowel appeared normal.  Ther was diffuse gastritis noted in the stomach.  Biopsies were done from the antrum to rule out presence of Helicobacter  pylori by CLO test, no ulcers, erosions, masses or polyps were identified.  Retroflexion in the high cardia revealed gastritis as above.  No hiatal  hernia was seen.  The patient tolerated the procedure well without  complications.   IMPRESSION:  1. Normal-appearing esophagus and proximal small  bowel.  2. Diffuse gastritis, biopsies done for CLO test.     RECOMMENDATIONS:  1. Treat with proton pump inhibitor.  2. Avoid all nonsteroidals.  3. Treat with antibiotics if CLO positive.  4. Proceed with colonoscopy at this time.  Further recommendations will be      made thereafter.        JNM/MEDQ  D:  02/21/2005  T:  02/21/2005  Job:  [id number]   cc:   Laurell Roof, M.D.  Int Medicine Resident - [address]  [place], [place] [address]  Fax: [phone]

## 2011-02-25 NOTE — Op Note (Signed)
NAMEJENNIE, [name]                  ACCOUNT NO.:  [id number]   MEDICAL RECORD NO.:  [id number]          PATIENT TYPE:  AMB   LOCATION:  ENDO                         FACILITY:  [facility]   PHYSICIAN:  Anselmo Rod, M.D.  DATE OF BIRTH:  October 12, 1924   DATE OF PROCEDURE:  02/21/2005  DATE OF DISCHARGE:                                 OPERATIVE REPORT   PROCEDURES PERFORMED:  Colonoscopy with cold biopsies x 2.   ENDOSCOPIST:  Anselmo Rod, M.D.   INSTRUMENT USED:  Olympus video colonoscope.   INDICATION FOR PROCEDURE:  A 77 year old African American female with a  history of strong guaiac-positive stools, rule out colonic polyps, masses,  etc.   PREPROCEDURE PREPARATION:  Informed consent was procured from the patient.  The patient fasted for eight hours prior to the procedure and prepped with a  bottle of magnesium citrate and a gallon of GoLYTELY the night prior to the  procedure.  The risks and benefits of the procedure, including a 10% missed  rate of cancer and polyps, were discussed with her as well.   PREPROCEDURE PHYSICAL EXAMINATION:  VITAL SIGNS:  Stable.  NECK:  Supple.  CHEST:  Clear to auscultation.  HEART:  S1 and S2 regular.  ABDOMEN:  Soft with normal bowel sounds.   DESCRIPTION OF PROCEDURE:  The patient was placed in the left lateral  decubitus position.  Sedated with and additional 40 mg of Demerol and 4 mg  of Versed after the EGD.  Once the patient was adequately sedated and  maintained on low-flow oxygen and continuous cardiac monitoring, the Olympus  video colonoscope was advanced from the rectum to the cecum.  There was some  difficulty in passing the scope around the rectosigmoid colon, but the scope  was gently maneuvered and the cecum was intubated.  The appendiceal orifice  and ileocecal valve were clearly visualized and photographed.  The terminal  ileum appeared normal without lesions.  Two small sessile polyps were  biopsied in the rectosigmoid  colon.  Retroflexion of the rectum revealed  small internal hemorrhoids.  No other masses or polyps were identified.  There was no evidence of diverticulosis.  The patient tolerated the  procedure well without complication.  No source of blood loss could be  identified.   IMPRESSION:  1. Small nonbleeding internal hemorrhoids.  2. Two small sessile polyps biopsied from the rectosigmoid colon at 20 cm.  3. Otherwise normal exam up to terminal ileum.  4. Some difficulty in passing the scope at the rectosigmoid colon, but no      abnormality recognized in the colonic mucosa.  Question extrinsic      compression.     RECOMMENDATIONS:  1. Await pathology results.  2. CT scan of the abdomen and pelvis and small bowel follow through to      complete her evaluation.  3. Outpatient followup after the above-mentioned tests have been done.        JNM/MEDQ  D:  02/21/2005  T:  02/21/2005  Job:  [id number]   cc:   Laurell Roof,  M.D.  Int Medicine Resident - [address]  [place], [place] [address]  Fax: [phone]

## 2011-02-25 NOTE — Op Note (Signed)
NAME:  [name], [name] NO.:  [id number]   MEDICAL RECORD NO.:  [id number]                   PATIENT TYPE:  INP   LOCATION:  3006                                 FACILITY:  [facility]   PHYSICIAN:  Kathaleen Maser. Pool, M.D.                 DATE OF BIRTH:  21-Jun-1925   DATE OF PROCEDURE:  02/17/2003  DATE OF DISCHARGE:                                 OPERATIVE REPORT   PREOPERATIVE DIAGNOSES:  1. L3-4 degenerative spondylolisthesis with stenosis.  2. Status post L4-5 posterolateral arthrodesis with pedicle screw     instrumentation.   POSTOPERATIVE DIAGNOSES:  1. L3-4 degenerative spondylolisthesis with stenosis.  2. Status post L4-5 posterolateral arthrodesis with pedicle screw     instrumentation.   PROCEDURES:  1. Re-exploration of L4-5 fusion with removal of instrumentation.  2. L3-4 decompressive laminectomy with foraminotomies.  3. L3-4 posterior lumbar interbody fusion utilizing Tangent wedges and local     autograft.  4. Posterolateral fusion utilizing pedicle screw instrumentation along with     local autograft at L3-4.   SURGEON:  Kathaleen Maser. Pool, M.D.   ASSISTANT:  Reinaldo Meeker, M.D.   ANESTHESIA:  General endotracheal.   INDICATIONS:  The patient is a 77 year old female who has a history of  previous L4-5 decompression and fusion procedure remotely in the past.  The  patient presents now with worsening back and bilateral lower extremity pain  and claudication consistent with symptoms from lumbar stenosis.  The patient  has marked lumbar stenosis with a degenerative grade 1 spondylolisthesis at  L3-4.  We have talked about options for management.  She has decided to  proceed with surgical decompression and fusion for hopeful improvement in  her symptoms.  We will plan on re-exploring the L4-5 fusion and make sure  this is solid and remove the instrumentation in order to instrument one  level higher.   DESCRIPTION OF PROCEDURE:  The  patient was taken to the operating room and  placed on the operating table in the supine position.  After an adequate  level of anesthesia was achieved, the patient was positioned prone onto a  Wilson frame and appropriately padded.  The patient's lumbar region was  prepped and draped sterilely.  A 10 blade was used to make a linear skin  incision overlying the L2, L3, L4, and L5 levels.  This was carried down  sharply in the midline.  A subperiosteal dissection was performed exposing  the laminae and facet joints of L2, L3, the facets joints at L4 and L5, as  well as the pedicle screw instrumentation at L4 and L5.  A deep self-  retaining retractor was placed, intraoperative fluoroscopy was used, and the  levels were confirmed.  Pedicle screw instrumentation was then removed.  The  fusion at L4-5 was inspected and found to be solid.  Attention then placed  to L3-4.  The remaining lamina of L3 was dissected free.  A complete  laminectomy of L3 was then performed using the high-speed drill and Kerrison  rongeurs to remove the entire lamina of L3, the entire inferior facet  complex of L3 bilaterally, and the majority of the superior facet complex of  L4 bilaterally.  Ligamentum flavum and epidural scar were elevated and  resected in piecemeal fashion using Kerrison rongeurs.  The underlying  thecal sac and exiting L3 and L4 nerve roots were identified and wide  foraminotomies were performed along the course of the exiting nerve roots.  The epidural venous plexus was coagulated and cut.  Starting first at the  patient's right side, thecal sac and nerve roots were mobilized and  retracted toward the midline.  The disk space was isolated and incised with  a 15 blade in rectangular fashion.  A wide disk space clean-out was then  achieved using pituitary rongeurs, upward-angled pituitary rongeurs, and  Epstein curettes.  The disk space was then entered and distracted up to 8  mm.  An 8 mm  distractor was left in the patient's right side.  The disk  space was then incised and cleaned out on the patient's left.  With the  thecal sac and nerve roots protected, the  disk space was then reamed with  an 8 mm Tangent reamer and then cut with an 8 mm Tangent chisel.  The soft  tissue was removed from the interspace. An 8 x 26 mm Tangent wedge was then  impacted in place, recessed approximately 2 mm from the posterior cortical  margin.  The distractor was removed from the patient's right side.  Thecal  sac and nerve roots were then protected on the right side.  The disk space  was then reamed and then cut with an 8 mm chisel.  Soft tissue was removed  from the interspace.  The disk space was further curetted.  Morcellized  autograft was then packed in the interspace.  A second 8 x 26 mm Tangent  wedge was then impacted into place and recessed approximately 2 mm from the  posterior cortical margin.  Intraoperative fluoroscopy revealed good  position of the bone grafts at the proper operative level, with good  reduction of the patient's deformity.  The pedicles of L3 were then isolated  bilaterally using surface landmarks, intraoperative fluoroscopy.  Each  pedicle was then entered by removing a small area of bone dorsally using the  high-speed drill.  Each pedicle was then probed using a pedicle awl.  Each  pedicle awl track was then probed and found to be solidly within bone.  Each  pedicle awl track was then tapped with a 5.25 mm screw tap.  Spiral 90 6.75  x 45 mm screws were placed bilaterally at L3.  Spiral 90 7.75 x 40 mm screws  were placed bilaterally at L4 through the previously-existing holes. Final  images revealed good position of the bone grafts and hardware at the proper  operative level.  The transverse processes of L3 were then decorticated  using the high-speed drill, as was the fusion mass at L4-5.  Morcellized autograft was placed posterolaterally for later fusion.  A  short segment of  titanium rod was then contoured and placed over the screw heads.  The  locking caps were then placed over the screw heads, and these were engaged  in sequential fashion with the construct under compression.  Final images  revealed good position  of bone grafts and hardware at proper operative  level, with normal alignment of the spine.  The wound was then irrigated  with antibiotic solution.  It  was then closed in a typical fashion with  Vicryl sutures.  Steri-Strips and a sterile dressing were applied.  There  were no apparent complications.  The patient tolerated the procedure well,  and she returns to the recovery room postop.                                               Henry A. Pool, M.D.    HAP/MEDQ  D:  02/17/2003  T:  02/18/2003  Job:  [id number]

## 2011-02-25 NOTE — Discharge Summary (Signed)
   NAME:  [name], WOMAC NO.:  [id number]   MEDICAL RECORD NO.:  [id number]                   PATIENT TYPE:  INP   LOCATION:  3006                                 FACILITY:  [facility]   PHYSICIAN:  Kathaleen Maser. Pool, M.D.                 DATE OF BIRTH:  September 15, 1925   DATE OF ADMISSION:  02/17/2003  DATE OF DISCHARGE:  02/20/2003                                 DISCHARGE SUMMARY   FINAL DIAGNOSIS:  L3-4 grade 1 degenerative spondylolisthesis with stenosis,  status post L4-5 posterolateral arthrodesis with pedicle screws.   HISTORY OF PRESENT ILLNESS:  [name] is a 77 year old black female who is  status post previous L4-5 decompression and fusion, who presents with  worsening back and bilateral lower extremity symptoms.  Workup has  demonstrated evidence of an unstable grade 1 L3-4 degenerative  spondylolisthesis with severe stenosis.  The patient presents now for  decompression and fusion at the L3-4 level.  This will require takedown of  her previous instrumentation at L4-5.   HOSPITAL COURSE:  The patient was taken to the operating room and underwent  an uncomplicated L3-4 decompression and fusion.  Instrumentation was  performed.  Postoperatively the patient did very well.  Pain was much  improved.  Lower extremity function was much improved.  The wound was  healing well.  She gradually mobilized.  She was ready for discharge on her  third postoperative day.   CONDITION ON DISCHARGE:  Improved.   DISCHARGE DISPOSITION:  The patient will follow up in my office in one week.                                               Henry A. Pool, M.D.    HAP/MEDQ  D:  03/13/2003  T:  03/13/2003  Job:  [id number]

## 2011-02-25 NOTE — Consult Note (Signed)
NAME:  [name], [name] NO.:  [id number]   MEDICAL RECORD NO.:  [id number]                   PATIENT TYPE:  REC   LOCATION:  FOOT                                 [facility]AC[facility]   PHYSICIAN:  Jonelle Sports. Sevier, M.D.              DATE OF BIRTH:  Nov 01, 1924   DATE OF CONSULTATION:  DATE OF DISCHARGE:                                   CONSULTATION   HISTORY:  This 77 year old black female is referred by Dr. Jeanelle Malling for  evaluation of a severely dystrophic halluceal nail situation on her left  foot.   The patient gives a history that as a child in [place] a Writer on her  left great toe creating a sore on the top of the toe at the base of the nail  and in its center portion.  This slowly healed, but subsequent to that her  nail became progressively deformed and through the years she has had nail  growing separately on the medial and lateral aspects of that nailbed and  absolutely no nail growth in the central portion of the bed.  These  persisting nail components have become progressively dystrophic and gnarly  in appearance and are a source of occasional stabbing pain to her and are  aesthetically not to her liking.   She reports here today to see what we might do to assist her.   PAST MEDICAL HISTORY:  Is quite limited.  She is hypertensive.  She has had  some lumbar disk disease, with surgery for that, and also cataract surgery.   ALLERGIES:  SHE HAS NO KNOWN MEDICINAL ALLERGIES.   CURRENT MEDICATIONS:  Her only current medication is hydrochlorothiazide 25  mg per day.   EXAMINATION:  Examination today is limited to the distal lower extremities.  The feet are without gross deformity, are free of edema, have excellent  pulses and have normal skin temperatures throughout which are symmetrical as  well.  Her sensation is fully intact.  She has no open lesions on her feet.   The nail of the right hallux consists of two portions, one rising  medially  from the nail base and the other laterally, and these are rather dystrophic  and slightly twisted.  There is no nail in the central portion of this  hallux.   DISPOSITION:  1. The matter is discussed with the patient and it is recommended that we     can either reduce this nail for her periodically by __________, which     will make it less problematic and perhaps less unusual in appearance.  On     the other hand, it is recommended that an alternative would be to remove     the remaining portions of this nail and     to obliterate all remaining matrix.  This would then leave her with no     nail on  that toe, but aesthetically a better appearing toe.  She is     accepting of this latter recommendation.  2. Accordingly, a referral is made to Dr. Leonides Grills for this procedure to     be carried out in his office.                                               Jonelle Sports. Cheryll Cockayne, M.D.    RES/MEDQ  D:  10/16/2003  T:  10/16/2003  Job:  [id number]   cc:   Laurell Roof, M.D.  Int Medicine Resident - [address]  [place], [place] [address]  Fax: [phone]   Leonides Grills, M.D.  [address]  [place]  [place] [address]  Fax: [phone]

## 2011-02-25 NOTE — Op Note (Signed)
[name], [name]                  ACCOUNT NO.:  [id number]   MEDICAL RECORD NO.:  [id number]          PATIENT TYPE:  OIB   LOCATION:  2899                         FACILITY:  [facility]   PHYSICIAN:  Gabrielle Dare. [name], M.D.DATE OF BIRTH:  [birth year]   DATE OF PROCEDURE:  07/16/2004  DATE OF DISCHARGE:                                 OPERATIVE REPORT   PREOPERATIVE DIAGNOSIS:  Rule out temporal arteritis.   POSTOPERATIVE DIAGNOSIS:  Rule out temporal arteritis.   OPERATION PERFORMED:  Right temporal artery biopsy.   SURGEON:  Gabrielle Dare. [name], M.D.   ANESTHESIA:  Local with sedation.   INDICATIONS FOR PROCEDURE:  The patient is a 77 year old African-American  female who I evaluated in the office on the request of Laurell Roof, M.D.  from the internal medicine teaching service in regard to some tenderness in  her right temporal area.  The patient has been persistently bothered by this  and temporal arteritis is thought to be a likely cause.  Temporal artery  biopsy was requested.   DESCRIPTION OF PROCEDURE:  Informed consent was obtained.  The patient  received intravenous antibiotics.  She was brought to the operating room.  She received a stress dose of steroids as she has been taking Prednisone 60  mg daily and she had not taken that this morning.  That was administered by  anesthesia.  She was brought to the operating room.  Conscious sedation was  administered.  Her right temporal area was prepped and draped in the usual  sterile fashion.  The Doppler probe was used to easily find the superficial  branch of the temporal artery.  A 1.5 cm incision was made behind her hair  line.  Subcutaneous tissues were dissected down.  Dissection of the  subcutaneous tissue was aided by use of a Doppler to find this branch of the  temporal artery.  This was easily located and a 1 cm segment of it was  mobilized.  This was clamped proximally and distally and sectioned out  sharply and this  segment which was about 1 cm long before it contracted was  sent to pathology.  The artery was ligated proximally and distally with 3-0  silk with excellent hemostasis.  Doppler confirmed biopsy was of arterial  structure as the valve signal was gone in the bed after its removal.  The  wound was copiously irrigated.  Please note that prior to making incision,  local anesthetic was injected.  This was a mixture of 0.5% Marcaine with  epinephrine and 1% lidocaine.  Some further local was injected.  The wound  was copiously irrigated.  Hemostasis was ensured and the skin was closed  with a running 4-0 Monocryl subcuticular stitch.  Benzoin, Steri-Strips and  sterile dressings were applied.  Sponge, needle and instrument counts were  all correct.  After the subcuticular closure, some Dermabond was placed to  close and dress the wound.  The patient tolerated the procedure well without  apparent complication and was taken to the recovery room in stable  condition.     BET/MEDQ  D:  07/16/2004  T:  07/16/2004  Job:  [id number]

## 2011-03-21 ENCOUNTER — Encounter: Payer: Self-pay | Admitting: Internal Medicine

## 2011-03-21 ENCOUNTER — Ambulatory Visit (INDEPENDENT_AMBULATORY_CARE_PROVIDER_SITE_OTHER): Payer: Medicare Other | Admitting: Internal Medicine

## 2011-03-21 VITALS — BP 133/90 | HR 72 | Temp 97.8°F | Ht 64.0 in | Wt 162.2 lb

## 2011-03-21 DIAGNOSIS — D649 Anemia, unspecified: Secondary | ICD-10-CM

## 2011-03-21 DIAGNOSIS — R000 Tachycardia, unspecified: Secondary | ICD-10-CM

## 2011-03-21 DIAGNOSIS — K219 Gastro-esophageal reflux disease without esophagitis: Secondary | ICD-10-CM

## 2011-03-21 DIAGNOSIS — M549 Dorsalgia, unspecified: Secondary | ICD-10-CM

## 2011-03-21 DIAGNOSIS — F3289 Other specified depressive episodes: Secondary | ICD-10-CM

## 2011-03-21 DIAGNOSIS — K921 Melena: Secondary | ICD-10-CM

## 2011-03-21 DIAGNOSIS — K5900 Constipation, unspecified: Secondary | ICD-10-CM

## 2011-03-21 DIAGNOSIS — M48061 Spinal stenosis, lumbar region without neurogenic claudication: Secondary | ICD-10-CM

## 2011-03-21 DIAGNOSIS — I498 Other specified cardiac arrhythmias: Secondary | ICD-10-CM

## 2011-03-21 DIAGNOSIS — R609 Edema, unspecified: Secondary | ICD-10-CM

## 2011-03-21 DIAGNOSIS — K5909 Other constipation: Secondary | ICD-10-CM

## 2011-03-21 DIAGNOSIS — F329 Major depressive disorder, single episode, unspecified: Secondary | ICD-10-CM

## 2011-03-21 DIAGNOSIS — I10 Essential (primary) hypertension: Secondary | ICD-10-CM

## 2011-03-21 MED ORDER — OMEPRAZOLE 40 MG PO CPDR: 40.0000 mg | DELAYED_RELEASE_CAPSULE | Freq: Every day | ORAL | Status: DC

## 2011-03-21 MED ORDER — POLYETHYLENE GLYCOL 3350 17 GM/SCOOP PO POWD: 17.0000 g | Freq: Every day | ORAL | Status: DC | PRN

## 2011-03-21 MED ORDER — LIDOCAINE 5 % EX PTCH: MEDICATED_PATCH | CUTANEOUS | Status: DC

## 2011-03-21 MED ORDER — GABAPENTIN 300 MG PO CAPS: 300.0000 mg | ORAL_CAPSULE | Freq: Three times a day (TID) | ORAL | Status: DC

## 2011-03-21 NOTE — Assessment & Plan Note (Signed)
Patient has chronic constipation  which is aggravated with ferrous sulfate intake. Patient was prescribed MiraLax in April 2012 which seemed to be improving her constipation. Patient noted that she has no regular bowel movements and uses MiraLax only when necessary.

## 2011-03-21 NOTE — Assessment & Plan Note (Signed)
No lower extremity edema noted to this office visit. Lasix was discontinued at the last hospitalization in April of 2012 do to low blood pressure.

## 2011-03-21 NOTE — Assessment & Plan Note (Signed)
Patient has history of positive Hemoccult in the past recently in April of 2012. Patient is scheduled for colonoscopy on June 22 with Dr. Loreta Ave. We'll follow records.

## 2011-03-21 NOTE — Assessment & Plan Note (Signed)
Although it is not recommended in elderly, patient has been taking and tolerating it well since December. Patient denies any depressive mood or suicidal ideation.

## 2011-03-21 NOTE — Assessment & Plan Note (Signed)
Blood pressure well controlled on metoprolol 25 mg daily. Will continue current therapy at this point.

## 2011-03-21 NOTE — Progress Notes (Signed)
  Subjective:    Patient ID: [name], female    DOB: [birth year], 77 y.o.   MRN: [id number]  HPI This 77 year old female was with possibly history significant for anemia of chronic disease with a baseline around 10, depression, hypertension who presented to the clinic for regular office visit.   #1: Anemia: Denies any blood in the stool at this point. Patient has been worked up recently for this. Patient had a repeat test positive to hospitalization in April 2012. She is referred to colonoscopy for this month with Dr. Loreta Ave. Anemia panel was performed which shows iron deficiency with a ferritin of 79. Haptoglobin was 72. Protein electrophoresis shows no M spike.. Patient was referred to hematologist. #2 depression patient is currently taking amitriptyline and Remeron and is tolerating it well. He noted her mood is stable. #3 constipation: At last office visit patient was started on MiraLax. Patient noted that her bowel movements now has been stable. She uses MiraLax now only when necessary.   Review of Systems  Constitutional: Negative for fever and chills.  HENT: Negative for hearing loss and neck pain.   Eyes: Negative for visual disturbance.  Respiratory: Negative for chest tightness, shortness of breath and wheezing.   Cardiovascular: Negative for chest pain and palpitations.  Gastrointestinal: Negative for nausea, vomiting, abdominal pain, diarrhea, constipation, blood in stool and abdominal distention.  Genitourinary: Negative for hematuria and difficulty urinating.  Musculoskeletal: Positive for back pain. Negative for arthralgias.  Neurological: Negative for dizziness, weakness and headaches.  Hematological: Negative for adenopathy.       Objective:   Physical Exam  Constitutional: She is oriented to person, place, and time. She appears well-developed.  HENT:  Head: Normocephalic.  Neck: Neck supple.  Cardiovascular: Normal rate, regular rhythm and normal heart sounds.     Pulmonary/Chest: Effort normal and breath sounds normal.  Abdominal: Soft. Bowel sounds are normal. She exhibits no distension. There is no tenderness.  Musculoskeletal: Normal range of motion.  Neurological: She is alert and oriented to person, place, and time. She exhibits normal muscle tone. Coordination normal.  Skin: Skin is warm. No rash noted.  Psychiatric: She has a normal mood and affect.          Assessment & Plan:

## 2011-03-21 NOTE — Assessment & Plan Note (Signed)
Patient is on lidocaine patch on a daily basis. Patient noted that her pain is somehow controlled. She uses Tylenol when necessary. Restart her gabapentin since patient was not taking it noted he actually was helping her with her pain. It is unclear why she stopped the medication.

## 2011-03-21 NOTE — Assessment & Plan Note (Signed)
Sinus tachycardia was noted during last hospitalization which was most likely due to anemia and hypotension. Review records patient's heart rate has been within normal limits. TSH in April 2005 was within normal limits.

## 2011-03-21 NOTE — Assessment & Plan Note (Signed)
Patient's hemoglobin to last hospitalization in April 2012 was down from hemoglobin baseline of 10-11 to  hemoglobin 8. Repeat CBC for hospital followup showed a hemoglobin of 10. 2. Patient had positive P. test during the hospital admission. Patient will followup with a gastroenterologist for a colonoscopy on June 22. Furthermore patient is followed by hematologist. Will obtain records. Patient denies any blood in the stool at this point. We'll continue to monitor.

## 2011-04-01 ENCOUNTER — Telehealth: Payer: Self-pay | Admitting: *Deleted

## 2011-04-01 NOTE — Telephone Encounter (Signed)
Call from pt said that she is taking Gabapentin 330 mg 3 times a day.  Pt said that it made her nauseous at first.  Made her very sleepy as well.   She then had trembling and then cannot hold anything in her hands.  Dr Lucretia Roers year was consulted.  Pt to take 1 300 mg tablet at night until her visit in September.  Pt was asked to call if she continues to have problems with the medication.  Pt voiced understanding of plan.

## 2011-04-01 NOTE — Telephone Encounter (Signed)
-   As below

## 2011-04-04 ENCOUNTER — Other Ambulatory Visit: Payer: Self-pay | Admitting: Oncology

## 2011-04-04 DIAGNOSIS — D649 Anemia, unspecified: Secondary | ICD-10-CM

## 2011-04-04 LAB — CBC WITH DIFFERENTIAL/PLATELET
Basophils Absolute: 0 10*3/uL (ref 0.0–0.1)
EOS%: 2.7 % (ref 0.0–7.0)
Eosinophils Absolute: 0.2 10*3/uL (ref 0.0–0.5)
HCT: 40.6 % (ref 34.8–46.6)
HGB: 13.4 g/dL (ref 11.6–15.9)
MCH: 30.8 pg (ref 25.1–34.0)
MCV: 93.6 fL (ref 79.5–101.0)
MONO%: 13.4 % (ref 0.0–14.0)
NEUT#: 2.2 10*3/uL (ref 1.5–6.5)
NEUT%: 32.5 % — ABNORMAL LOW (ref 38.4–76.8)
lymph#: 3.4 10*3/uL — ABNORMAL HIGH (ref 0.9–3.3)

## 2011-04-04 LAB — CREATININE CLEARANCE, URINE, 24 HOUR
Collection Interval-CRCL: 24 hours
Creatinine Clearance: 39 mL/min — ABNORMAL LOW (ref 75–115)
Creatinine, 24H Ur: 618 mg/d — ABNORMAL LOW (ref 700–1800)

## 2011-04-06 ENCOUNTER — Encounter: Payer: Self-pay | Admitting: Internal Medicine

## 2011-04-06 ENCOUNTER — Emergency Department (HOSPITAL_COMMUNITY): Payer: Medicare Other

## 2011-04-06 ENCOUNTER — Inpatient Hospital Stay (HOSPITAL_COMMUNITY)
Admission: EM | Admit: 2011-04-06 | Discharge: 2011-04-09 | DRG: 251 | Disposition: A | Discharge status: Home / Self Care | Payer: Medicare Other | Attending: Internal Medicine | Admitting: Internal Medicine

## 2011-04-06 DIAGNOSIS — F3289 Other specified depressive episodes: Secondary | ICD-10-CM | POA: Diagnosis present

## 2011-04-06 DIAGNOSIS — K219 Gastro-esophageal reflux disease without esophagitis: Secondary | ICD-10-CM | POA: Diagnosis present

## 2011-04-06 DIAGNOSIS — E860 Dehydration: Secondary | ICD-10-CM | POA: Diagnosis present

## 2011-04-06 DIAGNOSIS — R000 Tachycardia, unspecified: Secondary | ICD-10-CM

## 2011-04-06 DIAGNOSIS — N179 Acute kidney failure, unspecified: Secondary | ICD-10-CM | POA: Diagnosis not present

## 2011-04-06 DIAGNOSIS — Z981 Arthrodesis status: Secondary | ICD-10-CM

## 2011-04-06 DIAGNOSIS — I10 Essential (primary) hypertension: Secondary | ICD-10-CM | POA: Diagnosis present

## 2011-04-06 DIAGNOSIS — F329 Major depressive disorder, single episode, unspecified: Secondary | ICD-10-CM | POA: Diagnosis present

## 2011-04-06 DIAGNOSIS — Z7982 Long term (current) use of aspirin: Secondary | ICD-10-CM

## 2011-04-06 DIAGNOSIS — E876 Hypokalemia: Secondary | ICD-10-CM | POA: Diagnosis not present

## 2011-04-06 DIAGNOSIS — I498 Other specified cardiac arrhythmias: Principal | ICD-10-CM | POA: Diagnosis present

## 2011-04-06 DIAGNOSIS — D649 Anemia, unspecified: Secondary | ICD-10-CM | POA: Diagnosis present

## 2011-04-06 DIAGNOSIS — I4892 Unspecified atrial flutter: Secondary | ICD-10-CM | POA: Diagnosis present

## 2011-04-06 DIAGNOSIS — M545 Low back pain, unspecified: Secondary | ICD-10-CM | POA: Diagnosis present

## 2011-04-06 DIAGNOSIS — G2581 Restless legs syndrome: Secondary | ICD-10-CM | POA: Diagnosis present

## 2011-04-06 DIAGNOSIS — R9431 Abnormal electrocardiogram [ECG] [EKG]: Secondary | ICD-10-CM

## 2011-04-06 DIAGNOSIS — K5900 Constipation, unspecified: Secondary | ICD-10-CM | POA: Diagnosis present

## 2011-04-06 LAB — CBC
Hemoglobin: 14.1 g/dL (ref 12.0–15.0)
MCH: 31.4 pg (ref 26.0–34.0)
Platelets: 220 10*3/uL (ref 150–400)
RBC: 4.49 MIL/uL (ref 3.87–5.11)
WBC: 7.5 10*3/uL (ref 4.0–10.5)

## 2011-04-06 LAB — COMPREHENSIVE METABOLIC PANEL
ALT: 29 U/L (ref 0–35)
Alkaline Phosphatase: 163 U/L — ABNORMAL HIGH (ref 39–117)
CO2: 27 mEq/L (ref 19–32)
GFR calc Af Amer: >60 mL/min (ref >60)
GFR calc non Af Amer: >60 mL/min (ref >60)
Glucose, Bld: 87 mg/dL (ref 70–99)
Potassium: 3.2 mEq/L — ABNORMAL LOW (ref 3.5–5.1)
Sodium: 140 mEq/L (ref 135–145)

## 2011-04-06 LAB — URINE MICROSCOPIC-ADD ON

## 2011-04-06 LAB — DIFFERENTIAL
Basophils Absolute: 0 10*3/uL (ref 0.0–0.1)
Basophils Relative: 0 % (ref 0–1)
Eosinophils Absolute: 0.2 10*3/uL (ref 0.0–0.7)
Monocytes Relative: 10 % (ref 3–12)
Neutro Abs: 3 10*3/uL (ref 1.7–7.7)
Neutrophils Relative %: 41 % — ABNORMAL LOW (ref 43–77)

## 2011-04-06 LAB — URINALYSIS, ROUTINE W REFLEX MICROSCOPIC
Hgb urine dipstick: NEGATIVE
Nitrite: NEGATIVE
Specific Gravity, Urine: 1.012 (ref 1.005–1.030)
Urobilinogen, UA: 0.2 mg/dL (ref 0.0–1.0)

## 2011-04-06 LAB — CK TOTAL AND CKMB (NOT AT ARMC)
CK, MB: 3.3 ng/mL (ref 0.3–4.0)
Relative Index: 2.5 (ref 0.0–2.5)
Total CK: 134 U/L (ref 7–177)

## 2011-04-06 LAB — POCT I-STAT, CHEM 8
BUN: 7 mg/dL (ref 6–23)
Calcium, Ion: 1 mmol/L — ABNORMAL LOW (ref 1.12–1.32)
HCT: 45 % (ref 36.0–46.0)
Sodium: 144 mEq/L (ref 135–145)
TCO2: 24 mmol/L (ref 0–100)

## 2011-04-06 LAB — TROPONIN I: Troponin I: <0.3 ng/mL (ref <0.30)

## 2011-04-06 MED ORDER — IOHEXOL 300 MG/ML  SOLN
75.0000 mL | Freq: Once | INTRAMUSCULAR | Status: AC | PRN
  Administered 2011-04-06: 75 mL via INTRAVENOUS

## 2011-04-06 NOTE — H&P (Signed)
Hospital Admission Note Date: 04/06/2011  Patient name: [name] Medical record number: [id number] Date of birth: [birth year] Age: 77 y.o. Gender: female PCP: Almyra Deforest, MD  Medical Service: [facility]  Attending physician:  Dr. Blanch Media     Resident [phone]): Dr. Lars Mage     Pager: [phone] Resident (R1): Dr. Lyn Hollingshead     Pager: [phone]  Chief Complaint: Tachycardia  History of Present Illness: [name] is a pleasant 77 yo woman with PMH of HTN was brought to the ED for tachycardia. She went to Dr. Loreta Ave, GI , office for colonoscopy. The preprocedure checkup showed that the patient was tachycardic. Patient was hypertensive with blood pressure in 170s/110's. Dr. Loreta Ave decided to transfer the patient through EMS to [facility] emergency department for evaluation of tachycardia and high blood pressure. Patient denied any chest pain shortness of breath, palpitations, fevers, excessive use of caffeine, denied smoking. Patient did mention that she has not eaten anything since last 2 days and is having dry mouth. Patient lives independently by herself her, patient cleans her house, patient can walk several blocks with her walker without getting short of breath, patient does her groceries by herself. No other complaints at this time.   No current facility-administered medications for this visit.   Current Outpatient Prescriptions  Medication Sig Dispense Refill  . acetaminophen (TYLENOL 8 HOUR) 650 MG CR tablet Take 650 mg by mouth 3 (three) times daily.        Marland Kitchen amitriptyline (ELAVIL) 25 MG tablet Take 25 mg by mouth daily.        Marland Kitchen aspirin 81 MG tablet Take 81 mg by mouth daily.        . Emollient (EUCERIN PLUS) 5-5 % LOTN Apply topically 2 (two) times daily. To affected skin area       . ferrous sulfate 325 (65 FE) MG tablet Take 325 mg by mouth 2 (two) times daily.        Marland Kitchen gabapentin (NEURONTIN) 300 MG capsule Take 1 capsule (300 mg total) by mouth 3 (three) times daily.  90  capsule  3  . lidocaine (LIDODERM) 5 % Apply to your back for pain. Each patch last 12 hrs; then 12 hrs off.  30 patch  2  . loratadine (ALAVERT) 10 MG tablet Take 10 mg by mouth daily.        . metoprolol (LOPRESSOR) 50 MG tablet Take 0.5 tablets (25 mg total) by mouth daily.  30 tablet  3  . mirtazapine (REMERON) 15 MG tablet Take 7.5 mg by mouth at bedtime.        Marland Kitchen omeprazole (PRILOSEC) 40 MG capsule Take 1 capsule (40 mg total) by mouth daily.  30 capsule  3  . polyethylene glycol powder (MIRALAX) powder Take 17 g by mouth daily as needed (for constipation. ). Take with 8 oz of juice or water 2 times a day until bowel movement achieved, then once a day  255 g  3   Facility-Administered Medications Ordered in Other Visits  Medication Dose Route Frequency Provider Last Rate Last Dose  . iohexol (OMNIPAQUE) 300 MG/ML injection 75 mL  75 mL Intravenous Once PRN Medication Radiologist   75 mL at 04/06/11 1615    Allergies: Review of patient's allergies indicates no known allergies.  Past Medical History  Diagnosis Date  . Hypertension   . Head ache   . Renal insufficiency, mild   . Depression   . Fecal occult blood test positive   .  Restless leg syndrome   . Anemia   . Cataracts, bilateral   . Hemorrhoids   . Rupture of rotator cuff of shoulder     s/p repair by Dr. Luiz Blare 7/07  . Lumbar spinal stenosis   . Postnasal drip     No past surgical history on file.  No family history on file.  History   Social History  . Marital Status: Single    Spouse Name: N/A    Number of Children: 1  . Years of Education: N/A   Occupational History  .     Social History Main Topics  . Smoking status: Never Smoker   . Smokeless tobacco: Not on file  . Alcohol Use: No  . Drug Use: No  . Sexually Active: Not on file   Other Topics Concern  . Not on file   Social History Narrative   Originally from [place]. She has been here in the [place] since the 60's.    Review of  Systems: As per HPI, all other systems reviewed and negative.  Physical Exam:  Vitals:  T- 98.6 F, PR-124, RR- 22, BP- 176/123--> 151/120 Lying: 130/min, 154/119 Sitting: 139/min, 159/128 Standing: 153/min, 162/116  Constitutional: Vital signs reviewed.  Patient is a well-developed and well-nourished in no acute distress and cooperative with exam. Alert and oriented x3.  Head: Normocephalic and atraumatic Mouth: no erythema or exudates, MMM Eyes: PERRL, EOMI, conjunctivae normal, No scleral icterus.  Neck: Supple, Trachea midline normal ROM, No JVD, mass, thyromegaly, or carotid bruit present.  Cardiovascular: Tachycardia,  S1 normal, S2 normal, no MRG, pulses symmetric and intact bilaterally Pulmonary/Chest: CTAB, no wheezes, rales, or rhonchi Abdominal: Soft. Non-tender, non-distended, bowel sounds are normal, no masses, organomegaly, or guarding present.  Musculoskeletal: No joint deformities, erythema, or stiffness, ROM full and no nontender Neurological: A&O x3, Strenght is normal and symmetric bilaterally, cranial nerve II-XII are grossly intact, no focal motor deficit, sensory intact to light touch bilaterally.  Skin: Warm, dry and intact. No rash, cyanosis, or clubbing.    Lab results: Istat Chem:  TCO2                                     24                0-100            mmol/L  Ionized Calcium                          1.00       l      1.12-1.32        mmol/L  Hemoglobin (HGB)                         15.3       h      12.0-15.0        g/dL  Hematocrit (HCT)                         45.0              36.0-46.0        %  Sodium (NA)  144               135-145          mEq/L  Potassium (K)                            3.5               3.5-5.1          mEq/L  Chloride                                 107               96-112           mEq/L  Glucose                                  93                70-99            mg/dL  BUN                                       7                 6-23             mg/dL  Creatinine                               0.90              0.50-1.10        mg/dL  CBC w/Diff:  WBC                                      7.5               4.0-10.5         K/uL  RBC                                      4.49              3.87-5.11        MIL/uL  Hemoglobin (HGB)                         14.1              12.0-15.0        g/dL  Hematocrit (HCT)                         40.5              36.0-46.0        %  MCV  90.2              78.0-100.0       fL  MCH -                                    31.4              26.0-34.0        pg  MCHC                                     34.8              30.0-36.0        g/dL  RDW                                      13.2              11.5-15.5        %  Platelet Count (PLT)                     220               150-400          K/uL  Neutrophils, %                           41         l      43-77            %  Lymphocytes, %                           47         h      12-46            %  Monocytes, %                             10                3-12             %  Eosinophils, %                           2                 0-5              %  Basophils, %                             0                 0-1              %  Neutrophils, Absolute                    3.0               1.7-7.7  K/uL  Lymphocytes, Absolute                    3.5               0.7-4.0          K/uL  Monocytes, Absolute                      0.8               0.1-1.0          K/uL  Eosinophils, Absolute                    0.2               0.0-0.7          K/uL  Basophils, Absolute                      0.0               0.0-0.1          K/uL   Troponin I                               <0.30             <0.30            Ng/ml   Creatine Kinase, Total                   134               7-177            U/L  CK, MB                                   3.3               0.3-4.0           ng/mL  Relative Index                           2.5               0.0-2.5   Color, Urine                             YELLOW            YELLOW  Appearance                               CLEAR             CLEAR  Specific Gravity                         1.012             1.005-1.030  pH                                       7.0  5.0-8.0  Urine Glucose                            NEGATIVE          NEG              mg/dL  Bilirubin                                NEGATIVE          NEG  Ketones                                  NEGATIVE          NEG              mg/dL  Blood                                    NEGATIVE          NEG  Protein                                  30         a      NEG              mg/dL  Urobilinogen                             0.2               0.0-1.0          mg/dL  Nitrite                                  NEGATIVE          NEG  Leukocytes                               NEGATIVE          NEG   Squamous Epithelial / LPF                RARE              RARE  Casts / HPF                              SEE NOTE.  a      NEG    HYALINE CASTS  RBC / HPF                                0-2               <3               RBC/hpf  Urine-Other                              SEE  NOTE.    MUCOUS PRESENT   Imaging results:  CXR:  Stable borderline to mild cardiomegaly.  No acute cardiopulmonary  Disease. CT scan: Negative for significant acute pulmonary embolus by CTA.   Assessment & Plan by Problem:  1. Asymptomatic tachycardia: Differential diagnoses include hypothyroidism, volume depletion, pulmonary embolism, acute coronary syndrome, hypoxia, use of stimulants or acute anxiety. Given that patient does not have chest pain, shortness of breath, no EKG changes and 1st set of cardiac enzymes negative, ischemia is less likely. CT angina negative which rules out pulmonary embolism. No hypoxia and/or use of stimulants noted. Patient's TSH was 0.76 on 01/07/2011. Patient  does not have fever. The most likely explanation for tachycardia at this time seems to be dehydration along with anxiety and the fact that patient has not eaten anything since last 2 days. Given patient's tachycardia was as high as 153 pulse standing, we would admit the patient for observation in stepdown unit. Along with restarting metoprolol home does we would also start patient on Cardizem one dose.  2. high blood pressure: We will restart patient on metoprolol home dose along with Cardizem 120 mg one dose to control heart rate and blood pressure.  3. T-wave inversion: Patient has chronic T-wave inversions and without evidence of active chest pain the likelihood of acute coronary syndrome is less. We will cycle cardiac enzymes x3.  4. GERD: The continue patient on Permax 40 mg by mouth daily.  5: Restless leg syndrome: Continue elavil 5 mg by mouth daily.  6. chronic low back pain status post L3-L4 posterior lumbar interbody fusion: Continue patient on gabapentin. Lidoderm patch 5% every 12 hours can be continued.  7. DVT: Lovenox 40 mg subcutaneous daily

## 2011-04-07 DIAGNOSIS — I4892 Unspecified atrial flutter: Secondary | ICD-10-CM

## 2011-04-07 DIAGNOSIS — R000 Tachycardia, unspecified: Secondary | ICD-10-CM

## 2011-04-07 DIAGNOSIS — R9431 Abnormal electrocardiogram [ECG] [EKG]: Secondary | ICD-10-CM

## 2011-04-07 LAB — CBC
HCT: 41.8 % (ref 36.0–46.0)
Hemoglobin: 14.1 g/dL (ref 12.0–15.0)
MCH: 30.2 pg (ref 26.0–34.0)
MCHC: 33.7 g/dL (ref 30.0–36.0)
MCV: 89.5 fL (ref 78.0–100.0)
RBC: 4.67 MIL/uL (ref 3.87–5.11)

## 2011-04-07 LAB — BASIC METABOLIC PANEL
BUN: 14 mg/dL (ref 6–23)
CO2: 27 mEq/L (ref 19–32)
Calcium: 8.1 mg/dL — ABNORMAL LOW (ref 8.4–10.5)
Chloride: 104 mEq/L (ref 96–112)
Creatinine, Ser: 0.81 mg/dL (ref 0.50–1.10)
Creatinine, Ser: 1.57 mg/dL — ABNORMAL HIGH (ref 0.50–1.10)
GFR calc Af Amer: 38 mL/min — ABNORMAL LOW (ref >60)
Glucose, Bld: 79 mg/dL (ref 70–99)
Glucose, Bld: 120 mg/dL — ABNORMAL HIGH (ref 70–99)
Potassium: 3.9 mEq/L (ref 3.5–5.1)
Sodium: 141 mEq/L (ref 135–145)
Sodium: 139 mEq/L (ref 135–145)

## 2011-04-07 LAB — CARDIAC PANEL(CRET KIN+CKTOT+MB+TROPI)
CK, MB: 3.7 ng/mL (ref 0.3–4.0)
Relative Index: 1.6 (ref 0.0–2.5)
Total CK: 225 U/L — ABNORMAL HIGH (ref 7–177)

## 2011-04-07 LAB — URINE CULTURE: Culture: NO GROWTH

## 2011-04-08 DIAGNOSIS — R000 Tachycardia, unspecified: Secondary | ICD-10-CM

## 2011-04-08 DIAGNOSIS — I471 Supraventricular tachycardia: Secondary | ICD-10-CM

## 2011-04-08 DIAGNOSIS — I5189 Other ill-defined heart diseases: Secondary | ICD-10-CM | POA: Insufficient documentation

## 2011-04-08 DIAGNOSIS — R9431 Abnormal electrocardiogram [ECG] [EKG]: Secondary | ICD-10-CM

## 2011-04-08 DIAGNOSIS — E875 Hyperkalemia: Secondary | ICD-10-CM

## 2011-04-08 DIAGNOSIS — I517 Cardiomegaly: Secondary | ICD-10-CM

## 2011-04-08 HISTORY — DX: Hyperkalemia: E87.5

## 2011-04-08 LAB — BASIC METABOLIC PANEL
BUN: 18 mg/dL (ref 6–23)
CO2: 25 mEq/L (ref 19–32)
Calcium: 7.9 mg/dL — ABNORMAL LOW (ref 8.4–10.5)
Creatinine, Ser: 1.38 mg/dL — ABNORMAL HIGH (ref 0.50–1.10)
Glucose, Bld: 101 mg/dL — ABNORMAL HIGH (ref 70–99)

## 2011-04-09 LAB — BASIC METABOLIC PANEL
BUN: 14 mg/dL (ref 6–23)
CO2: 24 mEq/L (ref 19–32)
Calcium: 8.7 mg/dL (ref 8.4–10.5)
Chloride: 107 mEq/L (ref 96–112)
Creatinine, Ser: 0.96 mg/dL (ref 0.50–1.10)
GFR calc Af Amer: >60 mL/min (ref >60)
Glucose, Bld: 102 mg/dL — ABNORMAL HIGH (ref 70–99)
Potassium: 5.3 mEq/L — ABNORMAL HIGH (ref 3.5–5.1)

## 2011-04-11 NOTE — Consult Note (Signed)
[name], GOETZKE NO.:  [id number]  MEDICAL RECORD NO.:  [id number]  LOCATION:  3309                         FACILITY:  [facility]  PHYSICIAN:  Peter M. Swaziland, M.D.  DATE OF BIRTH:  10/23/1924  DATE OF CONSULTATION:  04/07/2011 DATE OF DISCHARGE:                                CONSULTATION   HISTORY OF PRESENT ILLNESS:  [name] is a pleasant 77 year old African American female who was admitted on April 06, 2011, with tachycardia and hypertension.  She had had a similar admission from January 07, 2011, through January 11, 2011 with tachycardia.  At that time she also heme- positive stools and anemia.  She was actually scheduled for colonoscopy yesterday with the procedure was aborted because of tachycardia.  The patient denies any symptoms of palpitations, dizziness, or tachycardia. She has no history of TIA or stroke.  The only chest pain she experiences is an occasional gas-like symptom.  She does note some shortness of breath when doing her housework.  Review her ECGs and monitor strips from this admission as well as her admission in March demonstrates atrial flutter with a rapid ventricular response.  PAST MEDICAL HISTORY: 1. Hypertension. 2. Hemoccult-positive stool. 3. Anemia. 4. Restless legs syndrome. 5. Depression. 6. History of hemorrhoids. 7. History of colon polypectomy in 2006. 8. History of gastritis in 2006. 9. Lumbar stenosis.  PRIOR SURGERIES:  Rotator cuff repair in 2007.  ALLERGIES:  She has no known allergies.  CURRENT MEDICATIONS: 1. Cardizem CD 120 mg daily. 2. Lopressor 25 mg b.i.d. 3. Remeron 7.5 mg at bedtime. 4. Elavil 25 mg at bedtime. 5. Neurontin 300 mg t.i.d. 6. Protonix 40 mg daily. 7. Colace 100 mg b.i.d. 8. Lovenox 40 mg subcu daily. 9. Claritin 10 mg daily.  SOCIAL HISTORY:  The patient is single.  She has one child.  She has never smoked and does not drink alcohol.  She is originally from [place].  She has  lived in [place] since the 1960s.  FAMILY HISTORY:  Unknown.  REVIEW OF SYSTEMS:  The patient denies any change in bowel habits.  She denies any visible blood in her stool.  She denies any dysuria.  She has had no edema or orthopnea.  All other systems are reviewed and are negative.  PHYSICAL EXAMINATION:  GENERAL:  The patient is a pleasant obese black female in no acute distress. VITAL SIGNS:  Her blood pressure is 102/77, pulse is 80 and regular, in sinus rhythm.  She is afebrile.  Sats are 98% on room air. HEENT:  She is normocephalic, atraumatic.  Pupils are equal, round, and reactive to light and accommodation.  Extraocular movements are full. Oropharynx is clear with missing dentition. NECK:  Supple without JVD, adenopathy, thyromegaly, or bruits. LUNGS:  Clear. CARDIAC:  Regular rate and rhythm without gallop, murmur, or click. ABDOMEN:  Soft and nontender.  There is no masses or splenomegaly. Bowel sounds are positive. EXTREMITIES:  Femoral and pedal pulses are 2+ and symmetric.  She has no edema. SKIN:  Warm and dry.  She is alert and oriented x3.  Cranial nerves II through XII are intact.  LABORATORY  DATA:  The patient did have thyroid studies in March, which were normal.  Cardiac enzymes are negative x3.  Hemoglobin 14.1, hematocrit 41.8, white count 6700, platelets 247,000.  Sodium 141, potassium 2.7, chloride 101, CO2 of 27, glucose 79, BUN 8, creatinine 0.81.  She had a CT angiogram of the chest on admission, which showed no evidence of pulmonary emboli.  Her chest x-ray showed no active disease. ECG demonstrates atrial flutter with 2:1 conduction.  There is diffuse ST-T wave changes consistent with inferior lateral ischemia.  FINAL ASSESSMENT: 1. Atrial flutter recurrent with a rapid ventricular response.  The     patient is minimally symptomatic. 2. Severe hypokalemia exacerbated by diuretic therapy. 3. Anemia. 4. Heme-positive stool. 5.  Gastroesophageal reflux disease.  PLAN:  The patient has a CHADS score of 2.  Normally, I would recommend anticoagulation, but given her recent anemia and heme-positive stool, we will need to continue aspirin for now until her GI situation is cleared. I agree with the addition of Cardizem to metoprolol for better rate control.  She has now converted to sinus rhythm.  We will need to check an echocardiogram.  We need to replete her potassium to greater than 4.0.  Once her potassium is repleted, we can consider other options, such as antiarrhythmic drug therapy versus ablation.  I will discuss this with Electrophysiology.          ______________________________ Peter M. Swaziland, M.D.     PMJ/MEDQ  D:  04/07/2011  T:  04/08/2011  Job:  [id number]  cc:   Almyra Deforest, MD Blanch Media, M.D. Anselmo Rod, MD, [facility]  Electronically Signed by PETER Swaziland M.D. on 04/11/2011 09:38:12 AM

## 2011-04-13 NOTE — Discharge Summary (Signed)
[name], [name] NO.:  [id number]  MEDICAL RECORD NO.:  [id number]  LOCATION:  3733                         FACILITY:  [facility]  PHYSICIAN:  Blanch Media, M.D.DATE OF BIRTH:  04-09-1925  DATE OF ADMISSION:  04/06/2011 DATE OF DISCHARGE:  04/09/2011                              DISCHARGE SUMMARY   DISCHARGE DIAGNOSES: 1. Tachycardia, likely secondary to atrial flutter status post     ablation on April 08, 2011. 2. Hypertension. 3. Chronic T-wave inversion diffusely on EKG, no change during     admission. 4. Gastroesophageal reflux disease. 5. Restless legs syndrome. 6. Chronic low back pain status post L3-L4 posterior lumbar interbody     fusion. 7. History of chronic gastrointestinal bleed.  The patient was     scheduled for colonoscopy just before admission. 8. Chronic constipation.  DISCHARGE MEDICATIONS: 1. Mirtazapine 7.5 mg 1 tablet every night. 2. Metoprolol 25 mg p.o. b.i.d. 3. Diltiazem 120 mg extended release p.o. daily. 4. Amitriptyline 25 mg p.o. daily. 5. Gabapentin 300 mg p.o. t.i.d. 6. Lidocaine patch, apply to back, leave on for 12 hours and off for     12 hours as needed. 7. Loratadine 10 mg p.o. daily. 8. Ferrous sulfate 325 mg 1 tablet p.o. t.i.d. 9. Omeprazole 40 mg 1 capsule p.o. daily. 10.Aspirin 81 mg 1 tablet p.o. daily. 11.Tylenol 325 1-2 tablets p.o. t.i.d. p.r.n. for pain. 12.Timolol 0.5% ophthalmic solution 1 drop right eye b.i.d.  CONSULTATIONS:  Cardiology to help with tachycardia and EP ablation for the patient.  PROCEDURES PERFORMED: 1. Chest x-ray two-view April 01, 2011, impression; stable, borderline     to mild cardiomegaly.  No acute cardiopulmonary disease. 2. CT angiogram chest April 06, 2011, impression; negative for     significant acute pulmonary embolus by CTA. 3. A 2-D echo April 08, 2011, conclusion; mild LVH with normal systolic     function with EF of 55-60% and grade 1 diastolic dysfunction. 4.  Electrophysiologic study and right catheter ablation of atrial     tachycardia on April 08, 2011, by Dr. Lewayne Bunting.  Conclusion,     study demonstrates successful electrophysiologic studies and RF     catheter ablation of a single incessant atrial tachycardia with a     total of 10 RF energy applications delivered to the region near the     tricuspid valve aneurysm just posterior in the right atrium, also     near the coronary sinus os.  DISPOSITION AND FOLLOWUP:  [name] is to be discharged in relatively stable condition.  She is to be followed up with Dr. Ladona Ridgel in 3 weeks as an outpatient.  Also, she needs to be followed up at [facility] in 2 weeks from now and clinic will call with appointment date and time.  During the time of clinic visit, please check that the patient is still in sinus rhythm and if she is tachycardic, please check an EKG.  Also, please check her BMET to check her potassium levels which were low during the admission and were super corrected before discharge.  ADMITTING H AND P:  Chief complaint, tachycardia.  HISTORY  OF PRESENT ILLNESS:  [name] is a pleasant 77 year old woman with past medical history of hypertension who was brought to the ED for tachycardia.  She went to Dr. Loreta Ave with GI office for colonoscopy.  The preprocedure checkup showed that the patient was tachycardic.  The patient was hypertensive with blood pressures 117/110s.  Dr. Loreta Ave decided to transfer the patient through EMS to [facility] Emergency Department for evaluation of tachycardia and high blood pressure.  The patient denied any chest pain, shortness of breath, palpitations, fever, excessive use of caffeine, denied smoking.  The patient did mention that she has not eaten anything since last 2 days and is having dry mouth. The patient lives independently by herself and cleans her house also. The patient can walk several blocks with a walker without getting  short of breath.  The patient does her own groceries herself.  No other complaints at this time.  PHYSICAL EXAMINATION:  At admission: VITAL SIGNS:  Temperature 98.6, pulse rate 124, respirations 22, blood pressure 176/123.  Orthostatics, lying blood pressure 154/119, heart rate 130.  Sitting, blood pressure 159/128, heart rate 139 per minute. Standing, blood pressure 162/116, heart rate 153 per minute. GENERAL:  The patient is well developed and well nourished in no acute distress and cooperative with exam.  Alert and oriented x3. HEENT:  Head, normocephalic and atraumatic.  Moist mucous membranes. Eyes, PERRL, EOMI, conjunctivae normal, no scleral icterus. NECK:  Supple, trachea midline, normal ROM, no JVD, no mass, thyromegaly, or carotid bruit present. CARDIOVASCULAR:  Tachycardia, S1 normal, S2 normal.  No MRG.  Pulse symmetrical and intact bilaterally. PULMONARY:  Clear to auscultation bilaterally, no wheezes, rales, or rhonchi. ABDOMEN:  Soft, nontender, nondistended.  Bowel sounds are normal, no masses, no organomegaly or guarding present.  MUSCULOSKELETAL:  No joint deformities, erythema, or stiffness.  Range of motion is full and nontender. NEUROLOGIC:  Alert and oriented x3.  Strength is normal and symmetric bilaterally.  Cranial nerve II through XII grossly intact.  No focal motor deficits.  Sensory intact to light touch bilaterally. SKIN:  Warm, dry and intact.  No rash, cyanosis, or clubbing.  LABS ON ADMISSION:  I-Stat chemistry showed hemoglobin 15.3, hematocrit 45,  sodium 144, potassium 3.5, chloride 107, bicarb 24, BUN is 7, creatinine 0.9.  CBC with diff WBC 7.5, hemoglobin 14.1, hematocrit 40.5, platelets 220.  ANC 3.0.  Cardiac enzymes x1 negative in the ED. UA negative.  HOSPITAL COURSE BY PROBLEM: 1. Tachycardia.  The patient was denying any symptoms at the time of     admission in terms of her palpitations, chest pain, or shortness of     breath.  She was  also not having any syncope or     weakness.  She was started on IV fluid after 500 mL bolus with     continuous 100 mL/hour for 1 L.  She responded quickly very well to     the IV fluids.  Also, given a bolus of normal saline, the heart     rate quickly dropped to 70, brought back to normal sinus rhythm.     Initially, we thought that it was sinus tachycardia but after     Cardiology consultation, they diagnosed this as aflutter.  Her     heart rate remained in normal sinus rhythm most of the time on day     4 of admission, but jumped back to aflutter at times for few     minutes and got back  to normal sinus rhythm.  Per Cardiology, she     was evaluated by electrophysiology doctor for possible ablation     which she received on April 08, 2011, as mentioned in the procedures     performed.  Dr. Ladona Ridgel did the ablation and she will be followed     with Dr. Ladona Ridgel as an outpatient in 3 weeks.  She will also be     followed by the [facility] for recovery from     ablation in any reoccurrence of tachycardia.  She was asymptomatic     at the time of discharge. 2. Hypertension.  Her blood pressure was elevated during the admission     moderately, but back to normal before getting in the hospital.    She will be continued on her Cardizem     120 mg p.o. daily and metoprolol 25 mg p.o. b.i.d. for blood     pressure. 3. Hypokalemia. The     patient came with normal potassium but the next day of admission,     the potassium was 2.7.  She was appropriately repleted during the     hospital course, her potassium at the time of discharge is slightly     overcorrected at 5.3.  The magnesium level was normal at 1.6,     anyway she got 1 g IV magnesium repletion. 4. Chronic T-wave inversions.  There was nothing new in terms of her T-wave     inversions and nothing new recommended per Cardiology also. 5. Gastroesophageal reflux disease.  She had to be continued on     omeprazole 30 mg  p.o. daily. 6. Restless legs syndrome.  She was continued on Elavil 5 mg p.o.     daily. 7. Chronic pain.  She was continued on Lidoderm patch and gabapentin.  DISCHARGE LABS:  BMET; sodium 139, potassium 5.3, chloride 107, bicarb 24, BUN 14, creatinine 0.96, calcium 8.7.  DISCHARGE VITALS:  Temperature 99.1, blood pressure 146/88,  pulse 73, respirations 16, O2 sats 96% on room air.    ______________________________ Lyn Hollingshead, MD   ______________________________ Blanch Media, M.D.    RP/MEDQ  D:  04/09/2011  T:  04/10/2011  Job:  [id number]  cc:   Blanch Media, M.D. Dr. Lewayne Bunting  Electronically Signed by Lyn Hollingshead MD on 04/11/2011 12:25:57 PM Electronically Signed by Blanch Media M.D. on 04/13/2011 02:25:32 PM

## 2011-04-15 ENCOUNTER — Other Ambulatory Visit: Payer: Self-pay | Admitting: *Deleted

## 2011-04-15 DIAGNOSIS — F329 Major depressive disorder, single episode, unspecified: Secondary | ICD-10-CM

## 2011-04-18 MED ORDER — AMITRIPTYLINE HCL 25 MG PO TABS: 25.0000 mg | ORAL_TABLET | Freq: Every day | ORAL | Status: DC

## 2011-04-22 ENCOUNTER — Other Ambulatory Visit: Payer: Self-pay | Admitting: Internal Medicine

## 2011-04-28 NOTE — Op Note (Signed)
NAMECANDY, [name] NO.:  [id number]  MEDICAL RECORD NO.:  [id number]  LOCATION:  3733                         FACILITY:  [facility]  PHYSICIAN:  Doylene Canning. Ladona Ridgel, MD    DATE OF BIRTH:  [birth year]  DATE OF PROCEDURE:  04/08/2011 DATE OF DISCHARGE:                              OPERATIVE REPORT   PROCEDURE PERFORMED:  Electrophysiologic study and RF catheter ablation of atrial tachycardia.  INTRODUCTION:  The patient is an 77 year old woman with a history of recurrent tachy-palpitations and documented SVT at rates of 120 beats per minute.  Her SVT has been fairly incessant.  She has never had frank syncope.  She is now referred for catheter ablation.  DESCRIPTION OF PROCEDURE:  After informed was obtained, the patient was taken to the diagnostic EP lab in a fasting state.  After usual preparation and draping, intravenous fentanyl and diazepam was given for sedation.  A 6-French hexapolar catheter was inserted percutaneously into the right jugular vein and advanced to coronary sinus.  A 6-French quadripolar catheter was inserted percutaneously in the right femoral vein and advanced to right ventricle.  A 6-French quadripolar catheter was inserted percutaneously in the right femoral vein and advanced to His-bundle region.  After measurement of the basic intervals, rapid ventricular pacing was carried out demonstrating VA dissociation at 600 milliseconds.  Next, programed ventricular stimulation was carried out, also demonstrating VA dissociation.  Next, rapid atrial pacing was carried out at 600 milliseconds and stepwise decreased down to 420 milliseconds where AV Wenckebach was observed.  It should be noted that during rapid atrial pacing, there was inducible and nearly an incessant SVT.  Programed atrial stimulation was then carried also demonstrating inducible and nearly incessant SVT.  The AV node ERP was 600/400.  With all of the above and with inducible SVT,  mapping was carried out which demonstrated earliest atrial activation be low in the right atrium near the coronary sinus.  It was however clearly not in the coronary sinus. During SVT, PVCs replaced at time of His-bundle refractoriness and did not pre-excite the atrium.  In addition, ventricular pacing was carried out during SVT.  There was a VA/AV conduction sequence indicative of atrial tachycardia.  At this point, a diagnosis of a low atrial tachycardia originating near the coronary sinus was made.  A 7-French quadripolar ablation catheter was maneuvered into the right atrium. Mapping was carried out.  To help improve mapping, a 7-French 20-pole halo catheter was inserted into the right femoral vein and advanced into the right atrium as well.  This demonstrated clearcut early atrial activation at the floor the coronary sinus near the tricuspid valve annulus.  In this location, the atrial activation was quite early.  A total of 10 RF energy applications were delivered.  During RF energy application, the SVT would stop suddenly.  Following the final RF energy application, additional ablation was additional pacing was carried out and this demonstrated no inducible SVT.  At this point, the catheters were removed.  Hemostasis was assured and the patient was returned to her room in satisfactory condition.  There were no immediate procedure complications.  RESULTS:  A.  Baseline ECG demonstrates SVT at 120 beats per minute. B.  Baseline intervals sinus node cycle length was 540 milliseconds (atrial tachycardia), the HV interval was 30 milliseconds.  The QRS duration was 70 milliseconds, AH interval 92 milliseconds. C.  Rapid ventricular pacing.  Rapid atrial pacing demonstrating VA dissociation at 600 milliseconds. D.  Programed ventricular stimulation.  Programed ventricular stimulation demonstrated VA dissociation at 600 milliseconds. E.  Rapid atrial pacing.  Rapid atrial pacing  demonstrated AV Wenckebach cycle length of 420 milliseconds.  During rapid atrial pacing, there was easily inducible SVT.  This could also be pace terminated with rapid atrial pacing. F.  Programed atrial stimulation.  Programed atrial stimulation was carried out from the coronary sinus in the right atrium at base drive cycle of 161 milliseconds.  The S1, S2 interval stepwise was decreased down to 140 milliseconds with an atrial ERP was observed.  During programed atrial stimulation, there no AH jumps, no echo beats; however, there was inducible atrial tachycardia.  G.  Atrial tachycardia. Initiation was spontaneous and with rapid atrial pacing and with catheter manipulation, the duration was sustained.  Cycle length was 520 milliseconds.  The method of termination was with catheter ablation, also spontaneously. H.  Mapping.  Mapping of the atrial tachycardia demonstrated earliest atrial activation to be in the floor of the tricuspid valve annulus just posterior to the annulus itself and close to the CS os.  Tachycardia did not however originate from the CS os. 1. RF energy application.  Total of 10 RF energy applications were     delivered resulting in termination of atrial tachycardia.  CONCLUSIONS:  Study demonstrates successful electrophysiologic study and RF catheter ablation of a single incessant atrial tachycardia with a total of 10 RF energy applications delivered to the region near the tricuspid valve annulus just posterior in the right atrium, also near the coronary sinus os.     Doylene Canning. Ladona Ridgel, MD     GWT/MEDQ  D:  04/08/2011  T:  04/09/2011  Job:  [id number]  cc:   Blanch Media, M.D. Anselmo Rod, MD, FACG Peter M. [place], M.D.  Electronically Signed by Lewayne Bunting MD on 04/28/2011 08:36:32 AM

## 2011-04-28 NOTE — Consult Note (Signed)
  NAMESTARLEEN, [name] NO.:  [id number]  MEDICAL RECORD NO.:  [id number]  LOCATION:  3733                         FACILITY:  [facility]  PHYSICIAN:  Doylene Canning. Ladona Ridgel, MD    DATE OF BIRTH:  October 20, 1924  DATE OF CONSULTATION:  04/08/2011 DATE OF DISCHARGE:                                CONSULTATION   REQUESTING PHYSICIANS: 1. Dr. Allena Katz. 2. Peter M. [place], MD  INDICATION FOR CONSULTATION:  Evaluation of recurrent tachy palpitations.  HISTORY OF PRESENT ILLNESS:  The patient is a very pleasant 77 year old woman with hypertension and palpitations.  She was undergoing colonoscopy when she was found to be in tachycardia at a rate of 115 beats per minute.  She was admitted for additional evaluation. Subsequent evaluation demonstrated a long RP tachycardia at a rate between 110-120 beats per minute.  This was fairly incessant stopping and starting suddenly.  The patient has never had syncope.  She notes dyspnea on exertion, particularly when her heart is racing.  She notes dizziness and lightheadedness.  She has very mild palpitations.  She denies chest pain.  PAST MEDICAL HISTORY:  Notable for hypertension, renal insufficiency, and anemia.  She has had heme-positive stools for years.  FAMILY HISTORY:  Negative for premature coronary artery disease.  SOCIAL HISTORY:  The patient denies tobacco or ethanol abuse.  REVIEW OF SYSTEMS:  Notable for palpitations, fatigue, and weakness. Otherwise, all systems reviewed are negative except as noted in the HPI.  PHYSICAL EXAMINATION:  GENERAL:  She is a pleasant elderly woman in no acute distress. VITAL SIGNS:  Blood pressure was 115/75.  The pulse was 110 and regular. The respirations were 18.  Temperature was 98. HEENT:  Normocephalic and atraumatic.  Pupils are equal and round.  The oropharynx is moist.  Sclerae are anicteric. NECK:  No jugular venous distention.  There is no thyromegaly.  Trachea is midline.  Carotids  are 2+ and symmetric. LUNGS:  Clear bilaterally to auscultation.  No wheezes, rales, or rhonchi are present. CARDIAC:  Regular tachycardia with normal S1 and S2.  There is soft systolic murmur heard best at the left lower sternal border. ABDOMEN:  Obese, nontender, and nondistended.  There is no organomegaly. EXTREMITIES:  No cyanosis, clubbing, or edema.  The pulses are  2+ and symmetric. NEUROLOGIC:  Alert and oriented x3 with cranial nerves intact.  Strength is 5/5 and symmetric.  The EKG demonstrates a long RP tachycardia at a rate of 120 beats per minute.  Other EKG demonstrates sinus rhythm at 60 beats a minute.  IMPRESSION: 1. Recurrent tachy palpitations and documented incessant     supraventricular tachycardia. 2. Hypertension.  DISCUSSION:  I have discussed the treatment options with the patient. The risks, benefits, goals, and expectations of electrophysiologic study and catheter ablation have been discussed and she wished to proceed that will be scheduled earliest possible convenient time.     Doylene Canning. Ladona Ridgel, MD  GWT/MEDQ  D:  04/08/2011  T:  04/09/2011  Job:  [id number]  Electronically Signed by Lewayne Bunting MD on 04/28/2011 08:36:28 AM

## 2011-05-02 ENCOUNTER — Ambulatory Visit (INDEPENDENT_AMBULATORY_CARE_PROVIDER_SITE_OTHER): Payer: Medicare Other | Admitting: Physician Assistant

## 2011-05-02 ENCOUNTER — Other Ambulatory Visit: Payer: Self-pay | Admitting: *Deleted

## 2011-05-02 ENCOUNTER — Encounter: Payer: Self-pay | Admitting: Physician Assistant

## 2011-05-02 VITALS — BP 122/60 | HR 79 | Ht 62.0 in | Wt 159.0 lb

## 2011-05-02 DIAGNOSIS — I498 Other specified cardiac arrhythmias: Secondary | ICD-10-CM

## 2011-05-02 DIAGNOSIS — I471 Supraventricular tachycardia: Secondary | ICD-10-CM

## 2011-05-02 DIAGNOSIS — M549 Dorsalgia, unspecified: Secondary | ICD-10-CM

## 2011-05-02 NOTE — Telephone Encounter (Signed)
Pt requesting refill on lidpderm patches. Rx from 03/21/11 was never received. I called in that Rx to [facility] [address].

## 2011-05-02 NOTE — Assessment & Plan Note (Signed)
Maintaining sinus rhythm.  Continue current medications.  Followup with Dr. Ladona Ridgel in 3 months.

## 2011-05-02 NOTE — Patient Instructions (Signed)
Your physician recommends that you schedule a follow-up appointment in: 08/02/11 @ 2:15 to see Dr. Ladona Ridgel as per Tereso Newcomer, PA-C.  Your physician recommends that you continue on your current medications as directed. Please refer to the Current Medication list given to you today.

## 2011-05-02 NOTE — Progress Notes (Signed)
Pt called  In for refill on lidoderm patches.  The pharmacy never received Rx for refill from this date 03/21/11. Rx recalled in.

## 2011-05-02 NOTE — Assessment & Plan Note (Signed)
Controlled.  Continue current therapy.  

## 2011-05-02 NOTE — Progress Notes (Signed)
History of Present Illness: Primary Electrophysiologist:  Dr. Lewayne Bunting  [name] is a 77 y.o. female who presents for post hospital follow up.   The patient has a history of hypertension, GERD, depression and heme positive anemia.  She was scheduled for a colonoscopy.  However, she was noted to have tachycardia on 6/27 and was referred to [facility].  She was seen by Dr. [place] initially.  It was questioned whether or not this was atrial flutter.  She was then seen by Dr. Ladona Ridgel who felt that she had atrial tachycardia.  EPS with radiofrequency catheter ablation was recommended.  This was performed by Dr. Ladona Ridgel on 6/29.  The study demonstrated successful electrophysiologic study and RF catheter ablation of a single incessant atrial tachycardia with a total of 10 RF energy applications delivered to the region near the tricuspid valve annulus just posterior in the right atrium, also near the coronary sinus os.  Post procedure course was uneventful.    Hospital labs:  Na 139, K 5.3, BUN 14, Creatinine 0.96, Mg 1.6, Hgb 14.1, AST 53, ALT 29, Alb 3.4, CXR ok; Chest CT neg for PE.  She denies palpitations, syncope or near syncope.  She has occasional chest pains.  These are non-exertional.  She has had for years without change.  She notes some DOE.  This is better since her ablation.  No orthopnea, PND or significant edema.  Past Medical History  Diagnosis Date  . Hypertension   . Head ache   . Renal insufficiency, mild   . Depression   . Fecal occult blood test positive   . Restless leg syndrome   . Anemia   . Cataracts, bilateral   . Hemorrhoids   . Rupture of rotator cuff of shoulder     s/p repair by Dr. Luiz Blare 7/07  . Lumbar spinal stenosis   . Postnasal drip   . Atrial tachycardia     echo 04/08/11: EF 55-60%, mild LVH, grade 1 diast dysfxn;    s/p RFCA with Dr. Ladona Ridgel  04/09/11    Current Outpatient Prescriptions  Medication Sig Dispense Refill  . acetaminophen (TYLENOL  8 HOUR) 650 MG CR tablet Take 650 mg by mouth as needed.       Marland Kitchen amitriptyline (ELAVIL) 25 MG tablet Take 1 tablet (25 mg total) by mouth daily.  30 tablet  3  . aspirin 81 MG tablet Take 81 mg by mouth daily.        Marland Kitchen diltiazem (CARDIZEM) 120 MG tablet Take 120 mg by mouth daily.        . Emollient (EUCERIN PLUS) 5-5 % LOTN Apply topically 2 (two) times daily. To affected skin area       . ferrous sulfate 325 (65 FE) MG tablet Take 325 mg by mouth 2 (two) times daily.        Marland Kitchen gabapentin (NEURONTIN) 300 MG capsule Take 300 mg by mouth daily.        Marland Kitchen loratadine (CLARITIN) 10 MG tablet TAKE 1 TABLET BY MOUTH ONCE A DAY  31 tablet  3  . metoprolol (LOPRESSOR) 50 MG tablet Take 0.5 tablets (25 mg total) by mouth daily.  30 tablet  3  . mirtazapine (REMERON) 15 MG tablet Take 7.5 mg by mouth at bedtime.        Marland Kitchen omeprazole (PRILOSEC) 40 MG capsule Take 1 capsule (40 mg total) by mouth daily.  30 capsule  3  . lidocaine (LIDODERM) 5 %  Apply to your back for pain. Each patch last 12 hrs; then 12 hrs off.  30 patch  2  . polyethylene glycol powder (MIRALAX) powder Take 17 g by mouth daily as needed (for constipation. ). Take with 8 oz of juice or water 2 times a day until bowel movement achieved, then once a day  255 g  3    Allergies: Allergies  Allergen Reactions  . Ibuprofen   . Oxycontin     Vital Signs: BP 122/60  Pulse 79  Ht 5\' 2"  (1.575 m)  Wt 159 lb (72.122 kg)  BMI 29.08 kg/m2  PHYSICAL EXAM: Well nourished, well developed, in no acute distress HEENT: normal Neck: no JVD; right IJ site without hematoma Cardiac:  normal S1, S2; RRR; no murmur Lungs:  clear to auscultation bilaterally, no wheezing, rhonchi or rales Abd: soft, nontender, no hepatomegaly Ext: no edema;  RFV site without hematoma or bruit Skin: warm and dry Neuro:  CNs 2-12 intact, no focal abnormalities noted  EKG:  Sinus rhythm, heart rate 79, PAC, normal axis, LVH with diffuse T-wave inversions in leads one,  2, 3, aVF, the 3-V6 (prob LVH with repol abnl), no significant change when compared to prior tracings  ASSESSMENT AND PLAN:

## 2011-05-09 ENCOUNTER — Ambulatory Visit (INDEPENDENT_AMBULATORY_CARE_PROVIDER_SITE_OTHER): Payer: Medicare Other | Admitting: Internal Medicine

## 2011-05-09 ENCOUNTER — Encounter: Payer: Self-pay | Admitting: Internal Medicine

## 2011-05-09 VITALS — BP 136/81 | HR 74 | Resp 20 | Ht 61.0 in | Wt 160.9 lb

## 2011-05-09 DIAGNOSIS — I5189 Other ill-defined heart diseases: Secondary | ICD-10-CM

## 2011-05-09 DIAGNOSIS — K5909 Other constipation: Secondary | ICD-10-CM

## 2011-05-09 DIAGNOSIS — I498 Other specified cardiac arrhythmias: Secondary | ICD-10-CM

## 2011-05-09 DIAGNOSIS — D649 Anemia, unspecified: Secondary | ICD-10-CM

## 2011-05-09 DIAGNOSIS — M48061 Spinal stenosis, lumbar region without neurogenic claudication: Secondary | ICD-10-CM

## 2011-05-09 DIAGNOSIS — K5900 Constipation, unspecified: Secondary | ICD-10-CM

## 2011-05-09 DIAGNOSIS — R7401 Elevation of levels of liver transaminase levels: Secondary | ICD-10-CM

## 2011-05-09 DIAGNOSIS — I471 Supraventricular tachycardia: Secondary | ICD-10-CM

## 2011-05-09 DIAGNOSIS — E875 Hyperkalemia: Secondary | ICD-10-CM

## 2011-05-09 DIAGNOSIS — J309 Allergic rhinitis, unspecified: Secondary | ICD-10-CM

## 2011-05-09 DIAGNOSIS — I519 Heart disease, unspecified: Secondary | ICD-10-CM

## 2011-05-09 DIAGNOSIS — K921 Melena: Secondary | ICD-10-CM

## 2011-05-09 DIAGNOSIS — I4719 Other supraventricular tachycardia: Secondary | ICD-10-CM

## 2011-05-09 DIAGNOSIS — I10 Essential (primary) hypertension: Secondary | ICD-10-CM

## 2011-05-09 LAB — COMPREHENSIVE METABOLIC PANEL
Alkaline Phosphatase: 177 U/L — ABNORMAL HIGH (ref 39–117)
BUN: 14 mg/dL (ref 6–23)
CO2: 23 mEq/L (ref 19–32)
Glucose, Bld: 88 mg/dL (ref 70–99)
Total Bilirubin: 1.2 mg/dL (ref 0.3–1.2)

## 2011-05-09 MED ORDER — FERROUS SULFATE 325 (65 FE) MG PO TABS: 325.0000 mg | ORAL_TABLET | Freq: Two times a day (BID) | ORAL | Status: DC

## 2011-05-09 NOTE — Progress Notes (Signed)
  Subjective:    Patient ID: [name], female    DOB: 05/06/1925, 77 y.o.   MRN: [id number]  HPI This is a 77 year old female with PMH as outlined above who is here for a hospital follow up. Patient was admitted on 6/27 for Tachycardia likely secondary to atrial flutter s/p ablation on 6/29 by Dr Ladona Ridgel.  Patient was seen by Dr Loreta Ave for colonoscopy on 6/27 when she found to be tachycardic and Hypertensive and was tranferred per EMS to the ED. He was asymptomatic at that point. A CXR, CT angio was performed which showed borderline mild cardiomegaly otherwise within normal limits. 2D-Echo showed an EF of 55-60% with grade 1 diastolic dysfunction. Patient today noted that she is experiencing significant hip pain radiating down her thigh until to the knee. She walk up this morning with the pain. She noted it is a burning, shooting pain. In the past it would come and go but today it is persistent. She denies any trauma, fevers or chills, no weight lifting, no falls.     Review of Systems  Constitutional: Negative for fever, chills and fatigue.  Respiratory: Negative for chest tightness and shortness of breath.   Cardiovascular: Negative for chest pain and palpitations.  Gastrointestinal: Negative for nausea, diarrhea, constipation and abdominal distention.  Genitourinary: Negative for frequency, flank pain, decreased urine volume and difficulty urinating.  Musculoskeletal: Positive for back pain, arthralgias and gait problem.  Skin: Negative for rash.  Neurological: Negative for dizziness, weakness and numbness.       Objective:   Physical Exam  Constitutional: She is oriented to person, place, and time. She appears well-developed.  HENT:  Head: Normocephalic.  Eyes: Pupils are equal, round, and reactive to light.  Neck: Neck supple.  Cardiovascular: Normal rate and regular rhythm.   Pulmonary/Chest: Effort normal and breath sounds normal.  Abdominal: Soft. Bowel sounds are normal. She  exhibits no distension. There is no tenderness.  Musculoskeletal:       Lumbar back: She exhibits decreased range of motion and tenderness. She exhibits no swelling and no edema.       Back:  Neurological: She is alert and oriented to person, place, and time.  Skin: No rash noted.          Assessment & Plan:   No problem-specific assessment & plan notes found for this encounter.

## 2011-05-09 NOTE — Assessment & Plan Note (Signed)
Patient was noted to have likely sciatica. Patient was informed that she can take Tylenol for pain relieve. I will check Kidney function today. If within normal limits will start short course of NSAIDs. Patient has history of recurrent sciatica pain in the past. The back is somewhat controlled with lidocaine patch.

## 2011-05-09 NOTE — Assessment & Plan Note (Signed)
BP well controlled. Continue on current regimen. BP Readings from Last 3 Encounters:  05/09/11 136/81  05/02/11 122/60  03/21/11 133/90

## 2011-05-09 NOTE — Assessment & Plan Note (Signed)
The patient was found to have elevated K on day of discharge from the hospital. Will recheck today.

## 2011-05-09 NOTE — Assessment & Plan Note (Signed)
Was seen by Cardiology on 7/23. No further events noted. ECG revealed Sinus rhythm, heart rate 79, PAC, normal axis, LVH with diffuse T-wave inversions in leads one, 2, 3, aVF, the 3-V6 (prob LVH with repol abnl), no significant change when compared to prior tracings per Cardiology. Recommendation included to continue current regimen and follow up with Dr Ladona Ridgel in 3 month.

## 2011-05-09 NOTE — Assessment & Plan Note (Signed)
Patient was referred for colonoscopy and was evaluated by Dr Loreta Ave on 6/27 who noted tachycardia and hypertension. The patient was then admitted to the hospital for further evaluation. The colonoscopy was rescheduled for end of August. Will follow up.

## 2011-05-09 NOTE — Assessment & Plan Note (Signed)
Patient noted she is not experiencing to much of post nasal dripping. I will d/c Loratadine.

## 2011-05-09 NOTE — Assessment & Plan Note (Addendum)
Will recheck LFT today.   Update: LFT continues to be elevated. Meds reviewed. TSH normal 12/2010. Hep B panel no in records. Will continue to monitor and consider to check Hep B.

## 2011-05-09 NOTE — Assessment & Plan Note (Signed)
Patient noted that her constipation is improved after taking Miralax. She has now daily bowel movement. She noted that she is not taking Miralax daily. I encouraged the patient to continue Miralax if needed and increase fiber rich diet.

## 2011-05-10 ENCOUNTER — Telehealth: Payer: Self-pay | Admitting: Internal Medicine

## 2011-05-10 DIAGNOSIS — M48061 Spinal stenosis, lumbar region without neurogenic claudication: Secondary | ICD-10-CM

## 2011-05-10 MED ORDER — NAPROXEN 250 MG PO TABS: 250.0000 mg | ORAL_TABLET | Freq: Two times a day (BID) | ORAL | Status: DC

## 2011-05-10 NOTE — Telephone Encounter (Signed)
Patient noted the pain in the leg is still present. I informed her to d/c Tylenol since LFT is elevated. Will start short course of Naproxen for 10 days only. I informed the patient if she is experiencing similar symptoms when she is taking ibuprofen then she needs to stop the medication and call the clinic for further instruction.

## 2011-05-16 ENCOUNTER — Encounter: Payer: Self-pay | Admitting: Internal Medicine

## 2011-05-27 ENCOUNTER — Encounter (HOSPITAL_BASED_OUTPATIENT_CLINIC_OR_DEPARTMENT_OTHER): Payer: Medicare Other | Admitting: Oncology

## 2011-05-27 ENCOUNTER — Other Ambulatory Visit: Payer: Self-pay | Admitting: Oncology

## 2011-05-27 DIAGNOSIS — D72829 Elevated white blood cell count, unspecified: Secondary | ICD-10-CM

## 2011-05-27 DIAGNOSIS — D649 Anemia, unspecified: Secondary | ICD-10-CM

## 2011-05-27 LAB — COMPREHENSIVE METABOLIC PANEL
Albumin: 3.3 g/dL — ABNORMAL LOW (ref 3.5–5.2)
Alkaline Phosphatase: 221 U/L — ABNORMAL HIGH (ref 39–117)
CO2: 20 mEq/L (ref 19–32)
Calcium: 8.5 mg/dL (ref 8.4–10.5)
Chloride: 105 mEq/L (ref 96–112)
Glucose, Bld: 84 mg/dL (ref 70–99)
Potassium: 3.3 mEq/L — ABNORMAL LOW (ref 3.5–5.3)
Sodium: 138 mEq/L (ref 135–145)
Total Protein: 6.8 g/dL (ref 6.0–8.3)

## 2011-05-27 LAB — CBC WITH DIFFERENTIAL/PLATELET
Basophils Absolute: 0 10*3/uL (ref 0.0–0.1)
Eosinophils Absolute: 0.2 10*3/uL (ref 0.0–0.5)
HGB: 13 g/dL (ref 11.6–15.9)
LYMPH%: 37.5 % (ref 14.0–49.7)
MCV: 89.6 fL (ref 79.5–101.0)
MONO#: 1.1 10*3/uL — ABNORMAL HIGH (ref 0.1–0.9)
MONO%: 10.5 % (ref 0.0–14.0)
NEUT#: 5 10*3/uL (ref 1.5–6.5)
Platelets: 208 10*3/uL (ref 145–400)
RBC: 4.31 10*6/uL (ref 3.70–5.45)
RDW: 15.9 % — ABNORMAL HIGH (ref 11.2–14.5)
WBC: 10 10*3/uL (ref 3.9–10.3)
nRBC: 0 % (ref 0–0)

## 2011-05-27 LAB — IRON AND TIBC: Iron: 82 ug/dL (ref 42–145)

## 2011-05-30 ENCOUNTER — Other Ambulatory Visit: Payer: Self-pay | Admitting: Oncology

## 2011-05-30 ENCOUNTER — Encounter: Payer: Medicare Other | Admitting: Internal Medicine

## 2011-05-30 DIAGNOSIS — R945 Abnormal results of liver function studies: Secondary | ICD-10-CM

## 2011-05-30 DIAGNOSIS — C50919 Malignant neoplasm of unspecified site of unspecified female breast: Secondary | ICD-10-CM

## 2011-06-01 ENCOUNTER — Ambulatory Visit (HOSPITAL_COMMUNITY)
Admission: RE | Admit: 2011-06-01 | Discharge: 2011-06-01 | Disposition: A | Discharge status: Home / Self Care | Payer: Medicare Other | Source: Ambulatory Visit | Attending: Oncology | Admitting: Oncology

## 2011-06-01 DIAGNOSIS — Z853 Personal history of malignant neoplasm of breast: Secondary | ICD-10-CM | POA: Insufficient documentation

## 2011-06-01 DIAGNOSIS — C50919 Malignant neoplasm of unspecified site of unspecified female breast: Secondary | ICD-10-CM

## 2011-06-01 DIAGNOSIS — R945 Abnormal results of liver function studies: Secondary | ICD-10-CM | POA: Insufficient documentation

## 2011-06-14 ENCOUNTER — Other Ambulatory Visit: Payer: Self-pay | Admitting: Internal Medicine

## 2011-06-27 ENCOUNTER — Encounter: Payer: Medicare Other | Admitting: Internal Medicine

## 2011-07-03 ENCOUNTER — Other Ambulatory Visit: Payer: Self-pay | Admitting: Internal Medicine

## 2011-07-11 ENCOUNTER — Ambulatory Visit (INDEPENDENT_AMBULATORY_CARE_PROVIDER_SITE_OTHER): Payer: Medicare Other | Admitting: Internal Medicine

## 2011-07-11 ENCOUNTER — Encounter: Payer: Medicare Other | Admitting: Internal Medicine

## 2011-07-11 ENCOUNTER — Encounter: Payer: Self-pay | Admitting: Internal Medicine

## 2011-07-11 DIAGNOSIS — M542 Cervicalgia: Secondary | ICD-10-CM

## 2011-07-11 DIAGNOSIS — Z23 Encounter for immunization: Secondary | ICD-10-CM

## 2011-07-11 DIAGNOSIS — N259 Disorder resulting from impaired renal tubular function, unspecified: Secondary | ICD-10-CM

## 2011-07-11 DIAGNOSIS — M48061 Spinal stenosis, lumbar region without neurogenic claudication: Secondary | ICD-10-CM

## 2011-07-11 DIAGNOSIS — E875 Hyperkalemia: Secondary | ICD-10-CM

## 2011-07-11 DIAGNOSIS — R7401 Elevation of levels of liver transaminase levels: Secondary | ICD-10-CM

## 2011-07-11 DIAGNOSIS — I10 Essential (primary) hypertension: Secondary | ICD-10-CM

## 2011-07-11 LAB — COMPREHENSIVE METABOLIC PANEL
ALT: 19 U/L (ref 0–35)
Alkaline Phosphatase: 139 U/L — ABNORMAL HIGH (ref 39–117)
CO2: 25 mEq/L (ref 19–32)
Sodium: 142 mEq/L (ref 135–145)
Total Bilirubin: 1 mg/dL (ref 0.3–1.2)
Total Protein: 7.5 g/dL (ref 6.0–8.3)

## 2011-07-11 MED ORDER — GABAPENTIN 300 MG PO CAPS: 600.0000 mg | ORAL_CAPSULE | Freq: Every day | ORAL | Status: DC

## 2011-07-11 MED ORDER — CYCLOBENZAPRINE HCL 5 MG PO TABS: 5.0000 mg | ORAL_TABLET | Freq: Every day | ORAL | Status: AC

## 2011-07-11 MED ORDER — CYCLOBENZAPRINE HCL 5 MG PO TABS: 5.0000 mg | ORAL_TABLET | Freq: Three times a day (TID) | ORAL | Status: DC | PRN

## 2011-07-11 NOTE — Assessment & Plan Note (Addendum)
New onset in the setting of recent fall. Likely muscle skletal. Will start on Flexeril prn and increase gabapentin. Will d/c amytriptyline . No indication to do any imagine study. If symptoms worsen and patient is notable for neurological findings consider to get CT or MRI of spine.

## 2011-07-11 NOTE — Progress Notes (Deleted)
  Subjective:    Patient ID: [name], female    DOB: 1925-05-11, 77 y.o.   MRN: [id number]  HPI    Review of Systems     Objective:   Physical Exam        Assessment & Plan:

## 2011-07-11 NOTE — Progress Notes (Signed)
Subjective:   Patient ID: [name] female   DOB: May 19, 1925 77 y.o.   MRN: [id number]  HPI: [name] is a 77 y.o. with PMH significant as outlined below. Patient comes in here for a regular office visit.  Neck pain:  since one month after a fall in bath tub. Aching pain in the back of the neck. No tingling feeling in her arm. Patient has chronic right shoulder pain due to rotator cuff tear. Patient has chronic pain all over the body.    Past Medical History  Diagnosis Date  . Hypertension   . Head ache   . Renal insufficiency, mild   . Depression   . Fecal occult blood test positive   . Restless leg syndrome   . Anemia   . Cataracts, bilateral   . Hemorrhoids   . Rupture of rotator cuff of shoulder     s/p repair by Dr. Luiz Blare 7/07  . Lumbar spinal stenosis   . Postnasal drip   . Atrial tachycardia     echo 04/08/11: EF 55-60%, mild LVH, grade 1 diast dysfxn;    s/p RFCA with Dr. Ladona Ridgel  04/09/11   Current Outpatient Prescriptions  Medication Sig Dispense Refill  . amitriptyline (ELAVIL) 25 MG tablet Take 1 tablet (25 mg total) by mouth daily.  30 tablet  3  . aspirin 81 MG tablet Take 81 mg by mouth daily.        Marland Kitchen diltiazem (CARDIZEM) 120 MG tablet Take 120 mg by mouth daily.        . Emollient (EUCERIN PLUS) 5-5 % LOTN Apply topically 2 (two) times daily. To affected skin area       . ferrous sulfate 325 (65 FE) MG tablet Take 1 tablet (325 mg total) by mouth 2 (two) times daily.  60 tablet  3  . gabapentin (NEURONTIN) 300 MG capsule Take 300 mg by mouth daily.        Marland Kitchen lidocaine (LIDODERM) 5 % Apply to your back for pain. Each patch last 12 hrs; then 12 hrs off.  30 patch  2  . metoprolol (LOPRESSOR) 50 MG tablet Take 0.5 tablets (25 mg total) by mouth daily.  30 tablet  3  . mirtazapine (REMERON) 15 MG tablet Take 7.5 mg by mouth at bedtime.       . naproxen (NAPROSYN) 250 MG tablet Take 1 tablet (250 mg total) by mouth 2 (two) times daily with a meal.  20 tablet   0  . omeprazole (PRILOSEC) 40 MG capsule Take 1 capsule (40 mg total) by mouth daily.  30 capsule  3  . polyethylene glycol powder (GLYCOLAX/MIRALAX) powder DISSOLVE 17 GRAMS IN 8OZ OF WATER OR JUICE TWICE A DAY UNTIL BOWEL MOVEMENT ACHIEVED THEN EVERY DAY  255 g  1   No family history on file. History   Social History  . Marital Status: Single    Spouse Name: N/A    Number of Children: 1  . Years of Education: N/A   Occupational History  .     Social History Main Topics  . Smoking status: Never Smoker   . Smokeless tobacco: None  . Alcohol Use: No  . Drug Use: No  . Sexually Active: None   Other Topics Concern  . None   Social History Narrative   Originally from [place]. She has been here in the [place] since the 60's.   Review of Systems: Constitutional: Denies fever, chills, diaphoresis, appetite  change and fatigue.  HEENT: Denies photophobia, eye pain, redness, hearing loss, ear pain, congestion, sore throat, rhinorrhea, sneezing, mouth sores, trouble swallowing but noted neck pain, neck stiffness .   Respiratory: Denies SOB, DOE, cough, chest tightness,  and wheezing.   Cardiovascular: Denies chest pain, palpitations and leg swelling.  Gastrointestinal: Denies nausea, vomiting, abdominal pain, diarrhea, constipation, blood in stool and abdominal distention.  Genitourinary: Denies dysuria, urgency, frequency, hematuria, flank pain and difficulty urinating.  Skin: Denies pallor, rash and wound.  Neurological: Denies dizziness, seizures, syncope, weakness, light-headedness, numbness and headaches.   Objective:  Physical Exam: Filed Vitals:   07/11/11 1410  BP: 137/84  Pulse: 66  Temp: 98.5 F (36.9 C)  TempSrc: Oral  Height: 5\' 1"  (1.549 m)  Weight: 159 lb 6.4 oz (72.303 kg)   Constitutional: Vital signs reviewed.  Patient is a well-developed and well-nourished  in no acute distress and cooperative with exam. Alert and oriented x3.  Head: Normocephalic and  atraumatic Ear: TM normal bilaterally Mouth: no erythema or exudates, MMM Eyes: PERRL, EOMI, conjunctivae normal, No scleral icterus.  Neck: Supple, Trachea midline normal ROM, No JVD, mass, thyromegaly, or carotid bruit present. paraspinal tenderness on palpation.  Cardiovascular: RRR, S1 normal, S2 normal, no MRG, pulses symmetric and intact bilaterally Pulmonary/Chest: CTAB, no wheezes, rales, or rhonchi Abdominal: Soft. Non-tender, non-distended, bowel sounds are normal, no masses, organomegaly, or guarding present.  GU: no CVA tenderness Musculoskeletal: No joint deformities, erythema, or stiffness.  Neurological: A&O x3, Strenght is normal and symmetric bilaterally, no focal motor deficit, sensory intact to light touch bilaterally.  Skin: Warm, dry and intact. No rash, cyanosis, or clubbing.

## 2011-07-11 NOTE — Assessment & Plan Note (Addendum)
Will recheck today.   Update: Improving Liver Function test. Informed patient at the last office visit not to take any Tylenol anymore.

## 2011-07-12 ENCOUNTER — Encounter: Payer: Self-pay | Admitting: Internal Medicine

## 2011-07-12 NOTE — Assessment & Plan Note (Signed)
Well controlled . No changes in management. BP Readings from Last 3 Encounters:  07/11/11 137/84  05/09/11 136/81  05/02/11 122/60

## 2011-08-02 ENCOUNTER — Ambulatory Visit: Payer: Medicare Other | Admitting: Internal Medicine

## 2011-08-03 ENCOUNTER — Encounter (HOSPITAL_BASED_OUTPATIENT_CLINIC_OR_DEPARTMENT_OTHER): Payer: Medicare Other | Admitting: Oncology

## 2011-08-03 ENCOUNTER — Other Ambulatory Visit: Payer: Self-pay | Admitting: Oncology

## 2011-08-03 DIAGNOSIS — D72829 Elevated white blood cell count, unspecified: Secondary | ICD-10-CM

## 2011-08-03 DIAGNOSIS — D649 Anemia, unspecified: Secondary | ICD-10-CM

## 2011-08-03 LAB — CBC WITH DIFFERENTIAL/PLATELET
BASO%: 0.3 % (ref 0.0–2.0)
LYMPH%: 39.1 % (ref 14.0–49.7)
MCHC: 33.3 g/dL (ref 31.5–36.0)
MCV: 93.8 fL (ref 79.5–101.0)
MONO%: 15.7 % — ABNORMAL HIGH (ref 0.0–14.0)
Platelets: 193 10*3/uL (ref 145–400)
RBC: 4.34 10*6/uL (ref 3.70–5.45)

## 2011-08-10 ENCOUNTER — Ambulatory Visit (INDEPENDENT_AMBULATORY_CARE_PROVIDER_SITE_OTHER): Payer: Medicare Other | Admitting: Internal Medicine

## 2011-08-10 ENCOUNTER — Encounter: Payer: Self-pay | Admitting: Internal Medicine

## 2011-08-10 DIAGNOSIS — I471 Supraventricular tachycardia: Secondary | ICD-10-CM

## 2011-08-10 DIAGNOSIS — I498 Other specified cardiac arrhythmias: Secondary | ICD-10-CM

## 2011-08-10 DIAGNOSIS — I519 Heart disease, unspecified: Secondary | ICD-10-CM

## 2011-08-10 DIAGNOSIS — I5189 Other ill-defined heart diseases: Secondary | ICD-10-CM

## 2011-08-10 NOTE — Patient Instructions (Signed)
Your physician recommends that you continue on your current medications as directed. Please refer to the Current Medication list given to you today.  We will see you back on an as needed basis. 

## 2011-08-10 NOTE — Assessment & Plan Note (Signed)
Her symptoms are well-controlled after catheter ablation. She will continue her current medical therapy. Ulcer back as needed.

## 2011-08-10 NOTE — Progress Notes (Signed)
HPI [name] returns today for followup. She is a very pleasant 77 year old woman with a history of hypertension, palpitations, and documented SVT. Her main complaint is that of low back pain. She's had multiple procedures for treatment. I'm not certain whether she has spinal stenosis just musculoskeletal pain and disc disease. She denies any tachycardia palpitations since her catheter ablation. No syncope. She denies peripheral edema. Allergies  Allergen Reactions  . Ibuprofen     Confusion  . Oxycontin      Current Outpatient Prescriptions  Medication Sig Dispense Refill  . aspirin 81 MG tablet Take 81 mg by mouth daily.        . cyclobenzaprine (FLEXERIL) 5 MG tablet Take 5 mg by mouth 3 (three) times daily as needed.        . diltiazem (CARDIZEM CD) 120 MG 24 hr capsule Take 120 mg by mouth daily.       Marland Kitchen gabapentin (NEURONTIN) 300 MG capsule Take 2 capsules (600 mg total) by mouth daily.  30 capsule  3  . lidocaine (LIDODERM) 5 % Apply to your back for pain. Each patch last 12 hrs; then 12 hrs off.  30 patch  2  . metoprolol (LOPRESSOR) 50 MG tablet Take 0.5 tablets (25 mg total) by mouth daily.  30 tablet  3  . mirtazapine (REMERON) 15 MG tablet Take 7.5 mg by mouth at bedtime.       Marland Kitchen omeprazole (PRILOSEC) 40 MG capsule Take 1 capsule (40 mg total) by mouth daily.  30 capsule  3  . polyethylene glycol powder (GLYCOLAX/MIRALAX) powder DISSOLVE 17 GRAMS IN 8OZ OF WATER OR JUICE TWICE A DAY UNTIL BOWEL MOVEMENT ACHIEVED THEN EVERY DAY  255 g  1  . timolol (TIMOPTIC) 0.5 % ophthalmic solution          Past Medical History  Diagnosis Date  . Hypertension   . Head ache   . Renal insufficiency, mild   . Depression   . Fecal occult blood test positive   . Restless leg syndrome   . Anemia   . Cataracts, bilateral   . Hemorrhoids   . Rupture of rotator cuff of shoulder     s/p repair by Dr. Luiz Blare 7/07  . Lumbar spinal stenosis   . Postnasal drip   . Atrial tachycardia    echo 04/08/11: EF 55-60%, mild LVH, grade 1 diast dysfxn;    s/p RFCA with Dr. Ladona Ridgel  04/09/11  . Hyperkalemia 04/08/2011    ROS:   All systems reviewed and negative except as noted in the HPI.   Past Surgical History  Procedure Date  . Doppler echocardiography 2004     Family History  Problem Relation Age of Onset  . Coronary artery disease Neg Hx     premature     History   Social History  . Marital Status: Single    Spouse Name: N/A    Number of Children: 1  . Years of Education: N/A   Occupational History  .     Social History Main Topics  . Smoking status: Never Smoker   . Smokeless tobacco: Not on file  . Alcohol Use: No  . Drug Use: No  . Sexually Active: Not on file   Other Topics Concern  . Not on file   Social History Narrative   Originally from [place]. She has been here in the [place] since the 60's.     BP 124/84  Pulse 85  Wt  157 lb 12.8 oz (71.578 kg)  Physical Exam:  ill-appearing elderly woman, NAD HEENT: Unremarkable Neck:  No JVD, no thyromegally Lymphatics:  No adenopathy Back:  No CVA tenderness. Lower back tenderness is present. Lungs:  Clear with no wheezes, rales, or rhonchi. HEART:  Regular rate rhythm, no murmurs, no rubs, no clicks Abd:  soft, positive bowel sounds, no organomegally, no rebound, no guarding Ext:  2 plus pulses, no edema, no cyanosis, no clubbing Skin:  No rashes no nodules Neuro:  CN II through XII intact, motor grossly intact  Assess/Plan:

## 2011-08-10 NOTE — Assessment & Plan Note (Signed)
He denies chest pain, shortness of breath, or peripheral edema. She will maintain a low-sodium diet. No changes in medical therapy.

## 2011-08-11 ENCOUNTER — Other Ambulatory Visit: Payer: Self-pay | Admitting: Internal Medicine

## 2011-08-11 DIAGNOSIS — I471 Supraventricular tachycardia: Secondary | ICD-10-CM

## 2011-08-11 DIAGNOSIS — K219 Gastro-esophageal reflux disease without esophagitis: Secondary | ICD-10-CM

## 2011-08-12 ENCOUNTER — Telehealth: Payer: Self-pay | Admitting: *Deleted

## 2011-08-12 NOTE — Telephone Encounter (Signed)
Pt calls and request appt for back pain and wants PCS, she states her pain is worse and it is hard to do ACL's. appt is given for wed 11/7 at 1015 w/ dr sidhu. i am sending this to donnat., to address PCS

## 2011-08-15 NOTE — Telephone Encounter (Signed)
Dr. Baltazar Apo will see her on 11/7 and we can decide then.  Due to her age and report of back pain we can certainly refer to [facility] to request an evaluation.

## 2011-08-16 ENCOUNTER — Other Ambulatory Visit: Payer: Self-pay | Admitting: Internal Medicine

## 2011-08-16 ENCOUNTER — Other Ambulatory Visit: Payer: Self-pay | Admitting: *Deleted

## 2011-08-16 DIAGNOSIS — F329 Major depressive disorder, single episode, unspecified: Secondary | ICD-10-CM

## 2011-08-16 DIAGNOSIS — I10 Essential (primary) hypertension: Secondary | ICD-10-CM

## 2011-08-17 ENCOUNTER — Ambulatory Visit (INDEPENDENT_AMBULATORY_CARE_PROVIDER_SITE_OTHER): Payer: Medicare Other | Admitting: Internal Medicine

## 2011-08-17 ENCOUNTER — Encounter: Payer: Self-pay | Admitting: Internal Medicine

## 2011-08-17 ENCOUNTER — Ambulatory Visit: Payer: Medicare Other | Admitting: Licensed Clinical Social Worker

## 2011-08-17 ENCOUNTER — Ambulatory Visit (HOSPITAL_COMMUNITY)
Admission: RE | Admit: 2011-08-17 | Discharge: 2011-08-17 | Disposition: A | Discharge status: Home / Self Care | Payer: Medicare Other | Source: Ambulatory Visit | Attending: Internal Medicine | Admitting: Internal Medicine

## 2011-08-17 DIAGNOSIS — H269 Unspecified cataract: Secondary | ICD-10-CM

## 2011-08-17 DIAGNOSIS — K900 Celiac disease: Secondary | ICD-10-CM

## 2011-08-17 DIAGNOSIS — I498 Other specified cardiac arrhythmias: Secondary | ICD-10-CM

## 2011-08-17 DIAGNOSIS — K219 Gastro-esophageal reflux disease without esophagitis: Secondary | ICD-10-CM

## 2011-08-17 DIAGNOSIS — IMO0002 Reserved for concepts with insufficient information to code with codable children: Secondary | ICD-10-CM | POA: Insufficient documentation

## 2011-08-17 DIAGNOSIS — M549 Dorsalgia, unspecified: Secondary | ICD-10-CM

## 2011-08-17 DIAGNOSIS — M47812 Spondylosis without myelopathy or radiculopathy, cervical region: Secondary | ICD-10-CM | POA: Insufficient documentation

## 2011-08-17 DIAGNOSIS — Z742 Need for assistance at home and no other household member able to render care: Secondary | ICD-10-CM

## 2011-08-17 DIAGNOSIS — M5124 Other intervertebral disc displacement, thoracic region: Secondary | ICD-10-CM | POA: Insufficient documentation

## 2011-08-17 DIAGNOSIS — R51 Headache: Secondary | ICD-10-CM | POA: Insufficient documentation

## 2011-08-17 DIAGNOSIS — I471 Supraventricular tachycardia: Secondary | ICD-10-CM

## 2011-08-17 DIAGNOSIS — I10 Essential (primary) hypertension: Secondary | ICD-10-CM

## 2011-08-17 DIAGNOSIS — M48061 Spinal stenosis, lumbar region without neurogenic claudication: Secondary | ICD-10-CM

## 2011-08-17 DIAGNOSIS — R609 Edema, unspecified: Secondary | ICD-10-CM

## 2011-08-17 DIAGNOSIS — M542 Cervicalgia: Secondary | ICD-10-CM

## 2011-08-17 MED ORDER — CYCLOBENZAPRINE HCL 5 MG PO TABS: 5.0000 mg | ORAL_TABLET | Freq: Two times a day (BID) | ORAL | Status: DC | PRN

## 2011-08-17 MED ORDER — OMEPRAZOLE 40 MG PO CPDR: 40.0000 mg | DELAYED_RELEASE_CAPSULE | Freq: Every day | ORAL | Status: DC

## 2011-08-17 MED ORDER — LIDOCAINE 5 % EX PTCH: MEDICATED_PATCH | CUTANEOUS | Status: DC

## 2011-08-17 MED ORDER — GABAPENTIN 300 MG PO CAPS: 600.0000 mg | ORAL_CAPSULE | Freq: Every day | ORAL | Status: DC

## 2011-08-17 MED ORDER — DILTIAZEM HCL ER COATED BEADS 120 MG PO CP24: 120.0000 mg | ORAL_CAPSULE | Freq: Every day | ORAL | Status: DC

## 2011-08-17 MED ORDER — METOPROLOL TARTRATE 50 MG PO TABS: 25.0000 mg | ORAL_TABLET | Freq: Two times a day (BID) | ORAL | Status: DC

## 2011-08-17 MED ORDER — GADOBENATE DIMEGLUMINE 529 MG/ML IV SOLN
15.0000 mL | Freq: Once | INTRAVENOUS | Status: AC
  Administered 2011-08-17: 15 mL via INTRAVENOUS

## 2011-08-17 NOTE — Progress Notes (Signed)
  Subjective:    Patient ID: [name], female    DOB: 12-09-1924, 77 y.o.   MRN: [id number]  HPI [name] is a pleasant 77 year old woman with pmh significant for HTN, Depression, rotator cuff tear s/p repair, atrial tachycardia with cards following who presents for:  1.) Neck pain - states it began over month ago. Patient was getting out of bathtub, slipped and fell. She also fell outside of apartment which was mechanical fall. She landed on back/side. Did not hit head, no LOC. She did not seek medical attention during both times of fall. Patient is on right side of neck and back of neck. Worse with movement. Pt was prescribed flexeril but patient can't remember if she ever took it. There is some language barrier and difficult to obtain history. Denies numbness and tingling in upper extremities but does mention dropping things from time to time. Eg dropping spoon right hand  2.) Right shoulder rotator cuff tear - f/u with [facility] 08/23/2011. Patient states she very limited range of motion, and still with significant pain.   3.) Depression - states it is stable, but does have tendency to worry a lot.   4.) S/P Colonoscopy - pt seen by Dr. Loreta Ave and recommended gluten free diet.  Patient has no other complaints or concerns today. She denies chest pain, cough, sob, headache, N/V, changes in abdominal and urinary character.    Review of Systems  All other systems reviewed and are negative.       Objective:   Physical Exam  Constitutional: She appears well-developed.  HENT:  Head: Normocephalic.  Eyes: Pupils are equal, round, and reactive to light.  Neck:       Decreased ROM secondary to pain  Cardiovascular: Normal rate and regular rhythm.   Pulmonary/Chest: Effort normal and breath sounds normal.  Abdominal: Soft. Bowel sounds are normal.  Musculoskeletal:       Left shoulder extension, flexion, rotations, abduction and adduction wnl Right  shoulder rom significantly reduced Difficulty with neck extension and flexion due to pain  Neurological:       Normal reflexes, and tone Strength is equal bilaterally, grip strength is also normal          Assessment & Plan:

## 2011-08-17 NOTE — Assessment & Plan Note (Signed)
Unsure if this diagnosis is accurate. Patient brings in this paper that was given by Dr. Loreta Ave. It is a gluten free diet information with Celiac written on top. She states she had colonoscopy done not too long ago, but I cannot find the results in EPIC, centricity, or E-chart. Will request the last office note from GI office as well as colonoscopy report.

## 2011-08-17 NOTE — Assessment & Plan Note (Signed)
Lab Results  Component Value Date   NA 142 07/11/2011   K 3.9 07/11/2011   CL 105 07/11/2011   CO2 25 07/11/2011   BUN 12 07/11/2011   CREATININE 0.94 07/11/2011   CREATININE 0.87 05/27/2011   CREATININE 1.11* 04/04/2011    BP Readings from Last 3 Encounters:  08/17/11 155/89  08/10/11 124/84  07/11/11 137/84    Assessment: Hypertension control:  controlled Progress toward goals:  At goal Barriers to meeting goals: upon recheck it was 140/70  Plan: Hypertension treatment:  continue current medications

## 2011-08-17 NOTE — Assessment & Plan Note (Signed)
History of neck pain after 2 falls both considered mechanical. It was considered MSK and pt was prescribed flexeril for muscle spasms, however there seems to be some confusion regarding this medication, and thus unsure if she actually took it. She describes this history of dropping things in right hand, however grip strength was normal. This may be related to shoulder pathology as it is on the same side as right rotator cuff tear which has been repaired. However patient still has significant decreased range of motion of left neck, and shoulder. Cervical spine extension and flexion, rotation was decreased 2/2 pain. Previous cervical spine imaging included plain films back in 2008 which showed loss of disk space height are worse from C3-4 to C6-7. No subluxation or fracture and severe spondylosis with straightening of the normal lordosis. Given her degree of pain, loss of range of motion, dropping things will pursue further imaging with MRI cervical spine. Plan: -MRI cervical spine -Continue gabapentin and flexeril -F/u with orthopedic surgery next week and address this problem.

## 2011-08-17 NOTE — Progress Notes (Signed)
Soc. Work.  Referred by DR. Baltazar Apo and Scientist, research (physical sciences) for [employer].  The patient has both standard Medicare and Medicaid.   Ms. Bouffard is a pleasant 77 year old woman who walks w/ aid of walker and has multiple health issues including spinal stenosis, depression,  hx leg edema, unsteady gait, as well as depression.  Nursing also mentions a rotator cuff problem and the patient reported a new diagnosis of celiac disease w. Dr. Kathrynn Running recommending gluten free diet.   Social Supports:  Her son who lives in [place] is her main support.  She also attends church weekly on [address] here in [place] and another church member has been taking her for many years.   Transit:  She comes today using the [employer].   Ms. Youngman reports that she is needing assistance in many areas to include bathing, dressing, meals, cleaning.  I suspect she also needs assist w/ med mgmt. Ms. Fowers said that her memory is not as good as it used to be.  The medical record also shows hx of fall two months ago.   The patient said she has several medications that need prior authorization and are waiting at [facility] to be picked [facility].  I informed nurse Eunice Blase of situation w/ her medications.   A/P:  The plan is to order assessment for inhome aide that I will fax to [facility] today.   The patient has my card for future needs and concerns.  The patient expresses no other social work issues today.

## 2011-08-18 MED ORDER — MIRTAZAPINE 15 MG PO TABS: 7.5000 mg | ORAL_TABLET | Freq: Every day | ORAL | Status: DC

## 2011-08-30 ENCOUNTER — Other Ambulatory Visit: Payer: Self-pay | Admitting: Internal Medicine

## 2011-08-30 DIAGNOSIS — I10 Essential (primary) hypertension: Secondary | ICD-10-CM

## 2011-08-30 DIAGNOSIS — J302 Other seasonal allergic rhinitis: Secondary | ICD-10-CM

## 2011-08-30 DIAGNOSIS — F32A Depression, unspecified: Secondary | ICD-10-CM

## 2011-08-30 DIAGNOSIS — K219 Gastro-esophageal reflux disease without esophagitis: Secondary | ICD-10-CM

## 2011-08-30 DIAGNOSIS — F329 Major depressive disorder, single episode, unspecified: Secondary | ICD-10-CM

## 2011-09-07 ENCOUNTER — Telehealth: Payer: Self-pay | Admitting: *Deleted

## 2011-09-07 NOTE — Telephone Encounter (Signed)
Request refill on:  Amitriptyline HCL 25mg  - take 1 tab by mouth once a day. Qty #90   Date written:  12.12.2011

## 2011-09-08 NOTE — Telephone Encounter (Signed)
I d/c the medication.   Thanks, Almyra Deforest

## 2011-09-08 NOTE — Telephone Encounter (Signed)
[facility] pharmacy made awared.

## 2011-09-15 ENCOUNTER — Other Ambulatory Visit: Payer: Self-pay | Admitting: Internal Medicine

## 2011-09-15 DIAGNOSIS — G629 Polyneuropathy, unspecified: Secondary | ICD-10-CM

## 2011-09-16 ENCOUNTER — Encounter: Payer: Self-pay | Admitting: *Deleted

## 2011-09-26 ENCOUNTER — Encounter: Payer: Medicare Other | Admitting: Internal Medicine

## 2011-09-28 ENCOUNTER — Telehealth: Payer: Self-pay | Admitting: Oncology

## 2011-09-28 ENCOUNTER — Other Ambulatory Visit: Payer: Self-pay | Admitting: Oncology

## 2011-09-28 ENCOUNTER — Other Ambulatory Visit (HOSPITAL_BASED_OUTPATIENT_CLINIC_OR_DEPARTMENT_OTHER): Payer: Medicare Other | Admitting: Lab

## 2011-09-28 ENCOUNTER — Ambulatory Visit (HOSPITAL_BASED_OUTPATIENT_CLINIC_OR_DEPARTMENT_OTHER): Payer: Medicare Other | Admitting: Oncology

## 2011-09-28 VITALS — BP 145/89 | HR 77 | Temp 98.9°F | Ht 61.0 in | Wt 158.3 lb

## 2011-09-28 DIAGNOSIS — N189 Chronic kidney disease, unspecified: Secondary | ICD-10-CM

## 2011-09-28 DIAGNOSIS — D649 Anemia, unspecified: Secondary | ICD-10-CM

## 2011-09-28 DIAGNOSIS — F3289 Other specified depressive episodes: Secondary | ICD-10-CM

## 2011-09-28 DIAGNOSIS — M549 Dorsalgia, unspecified: Secondary | ICD-10-CM

## 2011-09-28 DIAGNOSIS — I10 Essential (primary) hypertension: Secondary | ICD-10-CM

## 2011-09-28 DIAGNOSIS — F329 Major depressive disorder, single episode, unspecified: Secondary | ICD-10-CM

## 2011-09-28 DIAGNOSIS — D72829 Elevated white blood cell count, unspecified: Secondary | ICD-10-CM

## 2011-09-28 DIAGNOSIS — K900 Celiac disease: Secondary | ICD-10-CM

## 2011-09-28 LAB — CBC WITH DIFFERENTIAL/PLATELET
BASO%: 0.4 % (ref 0.0–2.0)
Basophils Absolute: 0 10*3/uL (ref 0.0–0.1)
EOS%: 1.2 % (ref 0.0–7.0)
HCT: 33 % — ABNORMAL LOW (ref 34.8–46.6)
HGB: 10.9 g/dL — ABNORMAL LOW (ref 11.6–15.9)
LYMPH%: 45.1 % (ref 14.0–49.7)
MCH: 29.9 pg (ref 25.1–34.0)
MCHC: 33 g/dL (ref 31.5–36.0)
MCV: 90.7 fL (ref 79.5–101.0)
MONO%: 10 % (ref 0.0–14.0)
NEUT%: 43.3 % (ref 38.4–76.8)
Platelets: 238 10*3/uL (ref 145–400)
lymph#: 3.7 10*3/uL — ABNORMAL HIGH (ref 0.9–3.3)

## 2011-09-28 NOTE — Telephone Encounter (Signed)
appts made and printed for 4/,8/,12 2013,aom

## 2011-09-29 LAB — KAPPA/LAMBDA LIGHT CHAINS
Kappa free light chain: 5.83 mg/dL — ABNORMAL HIGH (ref 0.33–1.94)
Kappa:Lambda Ratio: 1.15 (ref 0.26–1.65)
Lambda Free Lght Chn: 5.09 mg/dL — ABNORMAL HIGH (ref 0.57–2.63)

## 2011-09-29 LAB — COMPREHENSIVE METABOLIC PANEL
Albumin: 3.1 g/dL — ABNORMAL LOW (ref 3.5–5.2)
Alkaline Phosphatase: 120 U/L — ABNORMAL HIGH (ref 39–117)
Glucose, Bld: 69 mg/dL — ABNORMAL LOW (ref 70–99)
Potassium: 4.4 mEq/L (ref 3.5–5.3)
Sodium: 144 mEq/L (ref 135–145)
Total Protein: 6.8 g/dL (ref 6.0–8.3)

## 2011-09-29 LAB — IRON AND TIBC: UIBC: 375 ug/dL (ref 125–400)

## 2011-09-29 NOTE — Progress Notes (Signed)
[facility] OFFICE PROGRESS NOTE  Cc:  Almyra Deforest, MD  DIAGNOSIS:  Anemia most likely secondary to anemia of chronic kidney disease vs. Component of celiac sprue vs. Ineffective erythropoeisis due to her age.  CURRENT TREATMENT:  Oral iron supplementation.   INTERVAL HISTORY: [name] 77 y.o. female returns for regular follow up.  She recently saw Dr. Loreta Ave who performed colonoscopy and thought that she had celiac sprue.  She was advised to adhere to gluten free diet.  This has been difficult for her.  She still has intermittent diarrhea.  She denies any visible source of bleeding such as melena, hematochezia, hemoptysis, hematemasis. She has chronic lower back pain that has been going on for many years.  She denies lower extremity paresthesia, bowel/bladder incontinence, focal motor weakness.    Patient denies fatigue, headache, visual changes, confusion, drenching night sweats, palpable lymph node swelling, mucositis, odynophagia, dysphagia, nausea vomiting, jaundice, chest pain, palpitation, shortness of breath, dyspnea on exertion, productive cough, gum bleeding, epistaxis, hematemesis, hemoptysis, abdominal pain, abdominal swelling, early satiety, melena, hematochezia, hematuria, skin rash, spontaneous bleeding, joint swelling, joint pain, heat or cold intolerance, depression, suicidal or homocidal ideation, feeling hopelessness.   MEDICAL HISTORY: Past Medical History  Diagnosis Date  . Hypertension   . Head ache   . Renal insufficiency, mild   . Depression   . Fecal occult blood test positive   . Restless leg syndrome   . Anemia   . Cataracts, bilateral   . Hemorrhoids   . Rupture of rotator cuff of shoulder     s/p repair by Dr. Luiz Blare 7/07  . Lumbar spinal stenosis   . Postnasal drip   . Atrial tachycardia     echo 04/08/11: EF 55-60%, mild LVH, grade 1 diast dysfxn;    s/p RFCA with Dr. Ladona Ridgel  04/09/11  . Hyperkalemia 04/08/2011    SURGICAL HISTORY:    Past Surgical History  Procedure Date  . Doppler echocardiography 2004    MEDICATIONS: Current Outpatient Prescriptions  Medication Sig Dispense Refill  . amitriptyline (ELAVIL) 25 MG tablet Take 25 mg by mouth at bedtime.        Marland Kitchen aspirin 81 MG tablet Take 81 mg by mouth daily.        . cyclobenzaprine (FLEXERIL) 5 MG tablet Take 1 tablet (5 mg total) by mouth 2 (two) times daily as needed.  60 tablet  1  . diltiazem (CARDIZEM CD) 120 MG 24 hr capsule TAKE 1 CAPSULE DAILY  30 capsule  3  . gabapentin (NEURONTIN) 300 MG capsule TAKE 2 CAPSULES (600 MG TOTAL) BY MOUTH DAILY.  60 capsule  3  . lidocaine (LIDODERM) 5 % Apply to your back for pain. Each patch last 12 hrs; then 12 hrs off.  30 patch  2  . loratadine (CLARITIN) 10 MG tablet TAKE 1 TABLET BY MOUTH ONCE A DAY  31 tablet  5  . metoprolol tartrate (LOPRESSOR) 25 MG tablet TAKE 1 TABLET BY MOUTH TWICE A DAY  60 tablet  3  . mirtazapine (REMERON) 7.5 MG tablet TAKE 1 TABLET BY MOUTH DAILY AT BEDTIME  30 tablet  3  . omeprazole (PRILOSEC) 40 MG capsule TAKE 1 CAPSULE BY MOUTH DAILY  30 capsule  3  . polyethylene glycol powder (GLYCOLAX/MIRALAX) powder DISSOLVE 17 GRAMS IN 8OZ OF WATER OR JUICE TWICE A DAY UNTIL BOWEL MOVEMENT ACHIEVED THEN EVERY DAY  255 g  1  . timolol (TIMOPTIC) 0.5 % ophthalmic solution  ALLERGIES:  is allergic to ibuprofen and oxycontin.  REVIEW OF SYSTEMS:  The rest of the 14-point review of system was negative.   Filed Vitals:   09/28/11 1232  BP: 145/89  Pulse: 77  Temp: 98.9 F (37.2 C)   Wt Readings from Last 3 Encounters:  09/28/11 158 lb 4.8 oz (71.804 kg)  05/27/11 158 lb 3.2 oz (71.759 kg)  08/17/11 159 lb 12.8 oz (72.485 kg)   ECOG Performance status: 1  PHYSICAL EXAMINATION:  General:  Thin-appearing woman in no acute distress.  Eyes:  no scleral icterus.  ENT:  There were no oropharyngeal lesions.  Neck was without thyromegaly.  Lymphatics:  Negative cervical, supraclavicular or  axillary adenopathy.  Respiratory: lungs were clear bilaterally without wheezing or crackles.  Cardiovascular:  Regular rate and rhythm, S1/S2, without murmur, rub or gallop.  There was no pedal edema.  GI:  abdomen was soft, flat, nontender, nondistended, without organomegaly.  Muscoloskeletal:  no spinal tenderness of palpation of vertebral spine.  Skin exam was without echymosis, petichae.  Neuro exam was nonfocal.  Patient needed some help to  get on and off exam table.  Gait was normal.  Patient was alerted and oriented.  Attention was good.   Language was appropriate.  Mood was normal without depression.  Speech was not pressured.  Thought content was not tangential.     LABORATORY/RADIOLOGY DATA:  Lab Results  Component Value Date   WBC 8.1 09/28/2011   HGB 10.9* 09/28/2011   HCT 33.0* 09/28/2011   PLT 238 09/28/2011   GLUCOSE 69* 09/28/2011   GLUCOSE 69* 09/28/2011   CHOL  Value: 116        ATP III CLASSIFICATION:  <200     mg/dL   Desirable  161-096  mg/dL   Borderline High  >=045    mg/dL   High        01/16/8118   TRIG 190* 01/08/2011   HDL 43 01/08/2011   LDLCALC  Value: 35        Total Cholesterol/HDL:CHD Risk Coronary Heart Disease Risk Table                     Men   Women  1/2 Average Risk   3.4   3.3  Average Risk       5.0   4.4  2 X Average Risk   9.6   7.1  3 X Average Risk  23.4   11.0        Use the calculated Patient Ratio above and the CHD Risk Table to determine the patient's CHD Risk.        ATP III CLASSIFICATION (LDL):  <100     mg/dL   Optimal  147-829  mg/dL   Near or Above                    Optimal  130-159  mg/dL   Borderline  562-130  mg/dL   High  >865     mg/dL   Very High 7/84/6962   ALT 40* 09/28/2011   ALT 40* 09/28/2011   AST 77* 09/28/2011   AST 77* 09/28/2011   NA 144 09/28/2011   NA 144 09/28/2011   K 4.4 09/28/2011   K 4.4 09/28/2011   CL 110 09/28/2011   CL 110 09/28/2011   CREATININE 0.94 09/28/2011   CREATININE 0.94 09/28/2011   BUN 13  09/28/2011   BUN 13 09/28/2011  CO2 21 09/28/2011   CO2 21 09/28/2011   TSH 0.768 01/07/2011   HGBA1C  Value: 6.0 (NOTE)                                                                       According to the ADA Clinical Practice Recommendations for 2011, when HbA1c is used as a screening test:   >=6.5%   Diagnostic of Diabetes Mellitus           (if abnormal result  is confirmed)  5.7-6.4%   Increased risk of developing Diabetes Mellitus  References:Diagnosis and Classification of Diabetes Mellitus,Diabetes Care,2011,34(Suppl 1):S62-S69 and Standards of Medical Care in         Diabetes - 2011,Diabetes Care,2011,34  (Suppl 1):S11-S61.* 01/08/2011    ASSESSMENT AND PLAN:   1. History of chronic anemia with negative extensive work up with lab test and GI eval except for possible celiac sprue.  Up until today, her Hgb had been stable.  Today, her Hgb is slightly decreased.  There is no active bleeding.  There is no need for transfusion.  Will continue to monitor her CBC and transfuse in the future if Hgb is less than 7.  I have low clinical pretest probability for MDS since she does not have pancytopenia.  However, if she were to have MDS, given her age and lack of social support, it's unsure if hypomethylating agent is appropriate.  I defer diagnostic bone marrow biopsy for now unless she develops worsened anemia or pancytopenia in the future.  2. Hypertension.  She is on diltiazem and metoprolol per PCP.   3. Depression.  On mirtazapine per PCP. 4. Renal insufficiency from presumed chronic hypertension.  Cr today is stable. She will continue to follow with PCP. 5. Celiac sprue:  I stressed importance of gluten free diet.  Will continue to monitor her iron level and consider IV iron in the future is she shows lab finding consistent with iron-deficiency anemia.

## 2011-10-13 ENCOUNTER — Encounter: Payer: Medicare Other | Admitting: Internal Medicine

## 2011-10-20 ENCOUNTER — Ambulatory Visit (INDEPENDENT_AMBULATORY_CARE_PROVIDER_SITE_OTHER): Payer: Medicare Other | Admitting: Internal Medicine

## 2011-10-20 ENCOUNTER — Encounter: Payer: Self-pay | Admitting: Internal Medicine

## 2011-10-20 VITALS — BP 113/67 | HR 74 | Temp 98.0°F | Wt 158.3 lb

## 2011-10-20 DIAGNOSIS — Z23 Encounter for immunization: Secondary | ICD-10-CM

## 2011-10-20 DIAGNOSIS — F329 Major depressive disorder, single episode, unspecified: Secondary | ICD-10-CM

## 2011-10-20 DIAGNOSIS — M48061 Spinal stenosis, lumbar region without neurogenic claudication: Secondary | ICD-10-CM

## 2011-10-20 DIAGNOSIS — I10 Essential (primary) hypertension: Secondary | ICD-10-CM

## 2011-10-20 MED ORDER — MIRTAZAPINE 15 MG PO TABS: 15.0000 mg | ORAL_TABLET | Freq: Every day | ORAL | Status: DC

## 2011-10-20 NOTE — Progress Notes (Signed)
Subjective:   Patient ID: [name] female   DOB: 03-25-1925 78 y.o.   MRN: [id number]  HPI: [name] is a 78 y.o. female with past medical history significant as outlined below who presented to the clinic for reck office visit. There is some confusion about her medication regimen. She had medication with her which were discontinued in the past at the same time she had multiple possible with the same medication and she was not sure if she was taking all of them. She would get informed by the pharmacy that a new medication straight to pick her up and she would go and pick it up before she is finishing up the old bottle. She reports she is kind of feeling sleepy but otherwise doing fine. She continues to have chronic back pain where an MRI was obtained on 11/  2012 by Diffuse cervical spondylosis. Facet arthritis is most severe at C2-3 and C3-4 on the left.  2. Diffuse degenerative disc disease with multilevel foraminal stenosis. 3. Disc protrusions into the neural foramina bilaterally at T1-2, left greater than right which could affect the T1 nerve roots. 4. No cord compression or myelopathy. The only significant enhancement is of the endplates at C3-4 and T1-2 secondary to  degenerative disc disease.    Past Medical History  Diagnosis Date  . Hypertension   . Head ache   . Renal insufficiency, mild   . Depression   . Fecal occult blood test positive   . Restless leg syndrome   . Anemia   . Cataracts, bilateral   . Hemorrhoids   . Rupture of rotator cuff of shoulder     s/p repair by Dr. Luiz Blare 7/07  . Lumbar spinal stenosis   . Postnasal drip   . Atrial tachycardia     echo 04/08/11: EF 55-60%, mild LVH, grade 1 diast dysfxn;    s/p RFCA with Dr. Ladona Ridgel  04/09/11  . Hyperkalemia 04/08/2011   Current Outpatient Prescriptions  Medication Sig Dispense Refill  . amitriptyline (ELAVIL) 25 MG tablet Take 25 mg by mouth at bedtime.        Marland Kitchen aspirin 81 MG tablet Take 81 mg by mouth  daily.        . cyclobenzaprine (FLEXERIL) 5 MG tablet Take 1 tablet (5 mg total) by mouth 2 (two) times daily as needed.  60 tablet  1  . diltiazem (CARDIZEM CD) 120 MG 24 hr capsule TAKE 1 CAPSULE DAILY  30 capsule  3  . gabapentin (NEURONTIN) 300 MG capsule TAKE 2 CAPSULES (600 MG TOTAL) BY MOUTH DAILY.  60 capsule  3  . lidocaine (LIDODERM) 5 % Apply to your back for pain. Each patch last 12 hrs; then 12 hrs off.  30 patch  2  . loratadine (CLARITIN) 10 MG tablet TAKE 1 TABLET BY MOUTH ONCE A DAY  31 tablet  5  . metoprolol tartrate (LOPRESSOR) 25 MG tablet TAKE 1 TABLET BY MOUTH TWICE A DAY  60 tablet  3  . mirtazapine (REMERON) 7.5 MG tablet TAKE 1 TABLET BY MOUTH DAILY AT BEDTIME  30 tablet  3  . omeprazole (PRILOSEC) 40 MG capsule TAKE 1 CAPSULE BY MOUTH DAILY  30 capsule  3  . polyethylene glycol powder (GLYCOLAX/MIRALAX) powder DISSOLVE 17 GRAMS IN 8OZ OF WATER OR JUICE TWICE A DAY UNTIL BOWEL MOVEMENT ACHIEVED THEN EVERY DAY  255 g  1  . timolol (TIMOPTIC) 0.5 % ophthalmic solution  Review of Systems: Constitutional: Denies fever, chills, diaphoresis, appetite change  Respiratory: Denies SOB, DOE, cough, chest tightness,  and wheezing.   Cardiovascular: Denies chest pain, palpitations and leg swelling.  Gastrointestinal: Denies nausea, vomiting, abdominal pain, diarrhea, constipation, blood in stool and abdominal distention.  Genitourinary: Denies dysuria, urgency, frequency, hematuria, flank pain and difficulty urinating.  Skin: Denies pallor, rash and wound.   Objective:  Physical Exam: Filed Vitals:   10/20/11 1614  BP: 113/67  Pulse: 74  Temp: 98 F (36.7 C)  TempSrc: Oral  Weight: 158 lb 4.8 oz (71.804 kg)   Constitutional: Vital signs reviewed.  Patient is a well-developed and well-nourished  in no acute distress and cooperative with exam. Alert and oriented x3.  Neck: Supple,  Cardiovascular: RRR, S1 normal, S2 normal, no MRG, pulses symmetric and intact  bilaterally Pulmonary/Chest: CTAB, no wheezes, rales, or rhonchi Abdominal: Soft. Non-tender, non-distended, bowel sounds are normal,  Musculoskeletal: Stiffness and decreased ROM noted of spine. Tenderness .  Neurological: A&O x3,  no focal motor deficit, sensory intact to light touch bilaterally.  Skin: Warm, dry and intact. No rash, cyanosis, or clubbing.  After reviewing her medication list I discontinue it loratadine, gabapentin, amitriptyline and increase Remeron to 15 mg daily. I trashed medication I discontinue. Furthermore I sent a list of the new medication to the pharmacy. I also discussed with the patient about delivery services for medication which she was agreeable. We will try to set up the service for [name]

## 2011-10-21 ENCOUNTER — Telehealth: Payer: Self-pay | Admitting: Licensed Clinical Social Worker

## 2011-10-21 NOTE — Telephone Encounter (Signed)
I called the [facility] and they said that patient has inhome aide 7 days a week (Mon thru Friday 4 to 7 and Sat Sun 12 to 2:30).   Called [facility] at [phone] and they confirmed they were working with her.   Huntley Dec at [facility] said they had suggested home delivery of her medications but at the time patient was not receptive.   Huntley Dec at [facility] also said that the aide is no longer able to run errands per Medicaid guidelines so they cannot pick up her medications for her.   We can try and ask the patient again if we could set up delivery of her medications to keep her on track.

## 2011-10-21 NOTE — Assessment & Plan Note (Signed)
Patient's chronic back pain is related to arthritis and diffuse degenerative disease. Patient was developed by neurosurgeon in the past and was not a candidate for surgery anymore. She has received injections in the past which somewhat relieved her pain. She is considering to reschedule an appointment for possible epidural injection. She states she will think about it and will make appointment by herself next week.

## 2011-10-21 NOTE — Assessment & Plan Note (Signed)
Blood pressure well controlled continue current regimen. BP Readings from Last 3 Encounters:  10/20/11 113/67  09/28/11 145/89  05/27/11 136/81

## 2011-10-21 NOTE — Assessment & Plan Note (Signed)
Discontinue amitriptyline since it is not advised in elderly. Instead I'll increase Remeron to 15 mg daily from 7.5. I believe part of her fatigue is related to medication and depression.

## 2011-11-16 ENCOUNTER — Other Ambulatory Visit: Payer: Self-pay | Admitting: Internal Medicine

## 2011-11-16 DIAGNOSIS — M62838 Other muscle spasm: Secondary | ICD-10-CM

## 2011-11-16 DIAGNOSIS — M4800 Spinal stenosis, site unspecified: Secondary | ICD-10-CM

## 2011-12-07 ENCOUNTER — Other Ambulatory Visit: Payer: Self-pay | Admitting: Internal Medicine

## 2011-12-08 NOTE — Telephone Encounter (Signed)
Amitriptyline was stopped by Dr. Loistine Chance.

## 2011-12-19 ENCOUNTER — Other Ambulatory Visit: Payer: Self-pay | Admitting: Internal Medicine

## 2011-12-21 ENCOUNTER — Other Ambulatory Visit: Payer: Self-pay | Admitting: *Deleted

## 2011-12-21 NOTE — Telephone Encounter (Signed)
[facility] because insurance will not pay for it. Please send in another muscle relaxer.

## 2011-12-22 NOTE — Telephone Encounter (Signed)
Flexeril is $4 at [facility] with or without insurance. Probably the same cost at [facility] and [facility] and other pharmacies. That probably is less than her lowest insurance copay. I would suggest that. If that is not acceptable to her, she needs to let us know which muscle relaxer is on her insurance lowest tier and I will see if appropriate.

## 2011-12-23 NOTE — Telephone Encounter (Signed)
I am not familiar with Amrix so I looked it up. It is extended release flexeril and is NOT RECOMMENDED in the elderly!!! Yet it is what medicare is recommending we Rx for this 78 yo. I will not fill and will forward to Dr Loistine Chance.

## 2011-12-23 NOTE — Telephone Encounter (Signed)
With medicare Flexeril is $ 230 a month!!!!!!! Her insurance will cover Amrix ER 15 mg or 30 mg I think it will be to confusing for pt to go to different pharmacies.

## 2011-12-26 NOTE — Telephone Encounter (Signed)
I agree with Dr Rogelia Boga and will not refill Amrix. Other Muscle relaxant would be Soma but it is not recommended in elderly, too. I can prescribe Flexeril and send the prescription to [facility] where it is 4 $ as Dr Rogelia Boga indicated.

## 2011-12-27 ENCOUNTER — Other Ambulatory Visit: Payer: Self-pay | Admitting: *Deleted

## 2011-12-27 DIAGNOSIS — M4800 Spinal stenosis, site unspecified: Secondary | ICD-10-CM

## 2011-12-27 NOTE — Telephone Encounter (Signed)
After all this[facility] will [facility] at cost of $1.00 Pt has her meds.

## 2011-12-31 MED ORDER — LIDOCAINE 5 % EX PTCH: 1.0000 | MEDICATED_PATCH | CUTANEOUS | Status: DC

## 2012-01-03 ENCOUNTER — Telehealth: Payer: Self-pay | Admitting: *Deleted

## 2012-01-03 NOTE — Telephone Encounter (Signed)
Received request from [facility]  [phone] for a prior authorization on pt's lidoderm patch.  Form must be completed by MD and faxed back to [facility].  Form placed in MD's box for completion.Kingsley Spittle Cassady3/26/20139:49 AM

## 2012-01-16 ENCOUNTER — Other Ambulatory Visit: Payer: Self-pay | Admitting: Internal Medicine

## 2012-01-16 DIAGNOSIS — Z1231 Encounter for screening mammogram for malignant neoplasm of breast: Secondary | ICD-10-CM

## 2012-01-19 ENCOUNTER — Other Ambulatory Visit: Payer: Self-pay | Admitting: Internal Medicine

## 2012-01-19 DIAGNOSIS — I10 Essential (primary) hypertension: Secondary | ICD-10-CM

## 2012-01-19 NOTE — Telephone Encounter (Signed)
Received fax form indicating that this request has been denied because " Lidoderm patch was prescribed for spinal stenosis and degenerative disk disease. Upon clinical review it is apparent that the requested drug is not prescribed for a medically accepted indication.  The physician's prescribed use of this medication does not meet the Medicare criteria and is not covered"  Will forward information to PCP for review.[name], Tammy Madewell Cassady4/11/20139:02 AM

## 2012-01-27 ENCOUNTER — Other Ambulatory Visit: Payer: Self-pay | Admitting: Oncology

## 2012-01-27 ENCOUNTER — Other Ambulatory Visit (HOSPITAL_BASED_OUTPATIENT_CLINIC_OR_DEPARTMENT_OTHER): Payer: Medicare Other | Admitting: Lab

## 2012-01-27 DIAGNOSIS — M549 Dorsalgia, unspecified: Secondary | ICD-10-CM

## 2012-01-27 DIAGNOSIS — D649 Anemia, unspecified: Secondary | ICD-10-CM

## 2012-01-27 LAB — CBC WITH DIFFERENTIAL/PLATELET
BASO%: 0.4 % (ref 0.0–2.0)
Eosinophils Absolute: 0.2 10*3/uL (ref 0.0–0.5)
LYMPH%: 41.3 % (ref 14.0–49.7)
MCHC: 31.4 g/dL — ABNORMAL LOW (ref 31.5–36.0)
MONO#: 1 10*3/uL — ABNORMAL HIGH (ref 0.1–0.9)
NEUT#: 3.3 10*3/uL (ref 1.5–6.5)
Platelets: 309 10*3/uL (ref 145–400)
RBC: 3.77 10*6/uL (ref 3.70–5.45)
RDW: 17.8 % — ABNORMAL HIGH (ref 11.2–14.5)
WBC: 7.5 10*3/uL (ref 3.9–10.3)
lymph#: 3.1 10*3/uL (ref 0.9–3.3)
nRBC: 0 % (ref 0–0)

## 2012-01-30 ENCOUNTER — Other Ambulatory Visit: Payer: Self-pay | Admitting: Internal Medicine

## 2012-01-30 DIAGNOSIS — M549 Dorsalgia, unspecified: Secondary | ICD-10-CM

## 2012-02-16 ENCOUNTER — Ambulatory Visit (HOSPITAL_COMMUNITY)
Admission: RE | Admit: 2012-02-16 | Discharge: 2012-02-16 | Disposition: A | Discharge status: Home / Self Care | Payer: Medicare HMO | Source: Ambulatory Visit | Attending: Internal Medicine | Admitting: Internal Medicine

## 2012-02-16 DIAGNOSIS — Z1231 Encounter for screening mammogram for malignant neoplasm of breast: Secondary | ICD-10-CM | POA: Insufficient documentation

## 2012-03-12 ENCOUNTER — Ambulatory Visit (INDEPENDENT_AMBULATORY_CARE_PROVIDER_SITE_OTHER): Payer: Medicare Other | Admitting: Internal Medicine

## 2012-03-12 ENCOUNTER — Encounter: Payer: Self-pay | Admitting: Internal Medicine

## 2012-03-12 VITALS — BP 137/75 | HR 66 | Temp 97.6°F | Ht 63.4 in | Wt 150.0 lb

## 2012-03-12 DIAGNOSIS — F3289 Other specified depressive episodes: Secondary | ICD-10-CM

## 2012-03-12 DIAGNOSIS — F329 Major depressive disorder, single episode, unspecified: Secondary | ICD-10-CM

## 2012-03-12 DIAGNOSIS — M48061 Spinal stenosis, lumbar region without neurogenic claudication: Secondary | ICD-10-CM

## 2012-03-12 DIAGNOSIS — I10 Essential (primary) hypertension: Secondary | ICD-10-CM

## 2012-03-12 LAB — BASIC METABOLIC PANEL
Chloride: 110 mEq/L (ref 96–112)
Creat: 1.07 mg/dL (ref 0.50–1.10)
Sodium: 142 mEq/L (ref 135–145)

## 2012-03-12 NOTE — Patient Instructions (Signed)
You can get over the counter ear wax removal. ( [place])

## 2012-03-12 NOTE — Progress Notes (Signed)
Subjective:   Patient ID: [name] female   DOB: 09/17/1925 78 y.o.   MRN: [id number]  HPI: [name] is a 78 y.o.  female with PMH significant as outlined below who presented to the clinic for a regular follow up. She noted that she is still having back pain and pain radiating down her left leg. She further noted some weakness . The pain has been present for very long time. Patient has seen in the past neurosurgeon who recommended surgery but she was reluctant at that time. She was planning to see Dr. Callie Fielding [facility]) for epidural injection but was not able to contact him.  She further noted that whenever she takes remeron she feels funny. She feels very tired and and it makes everything worse. She would like to stop it.    Past Medical History  Diagnosis Date  . Hypertension   . Head ache   . Renal insufficiency, mild   . Depression   . Fecal occult blood test positive   . Restless leg syndrome   . Anemia   . Cataracts, bilateral   . Hemorrhoids   . Rupture of rotator cuff of shoulder     s/p repair by Dr. Luiz Blare 7/07  . Lumbar spinal stenosis   . Postnasal drip   . Atrial tachycardia     echo 04/08/11: EF 55-60%, mild LVH, grade 1 diast dysfxn;    s/p RFCA with Dr. Ladona Ridgel  04/09/11  . Hyperkalemia 04/08/2011   Current Outpatient Prescriptions  Medication Sig Dispense Refill  . aspirin 81 MG tablet Take 81 mg by mouth daily.        . cyclobenzaprine (FLEXERIL) 5 MG tablet TAKE 1 TABLET (5 MG TOTAL) BY MOUTH 2 (TWO) TIMES DAILY AS NEEDED.  60 tablet  1  . diltiazem (CARDIZEM CD) 120 MG 24 hr capsule TAKE 1 CAPSULE DAILY  30 capsule  3  . diltiazem (CARDIZEM CD) 120 MG 24 hr capsule Take 1 capsule (120 mg total) by mouth daily.  30 capsule  11  . lidocaine (LIDODERM) 5 % Place 1 patch onto the skin daily. Remove & Discard patch within 12 hours or as directed by MD  30 patch  1  . metoprolol tartrate (LOPRESSOR) 25 MG tablet TAKE 1 TABLET BY MOUTH  TWICE A DAY  60 tablet  3  . mirtazapine (REMERON) 15 MG tablet Take 1 tablet (15 mg total) by mouth at bedtime.  30 tablet  3  . omeprazole (PRILOSEC) 40 MG capsule TAKE 1 CAPSULE BY MOUTH DAILY  30 capsule  3  . omeprazole (PRILOSEC) 40 MG capsule Take 1 capsule (40 mg total) by mouth daily.  30 capsule  11  . polyethylene glycol powder (GLYCOLAX/MIRALAX) powder DISSOLVE 17 GRAMS IN 8OZ OF WATER OR JUICE TWICE A DAY UNTIL BOWEL MOVEMENT ACHIEVED THEN EVERY DAY  255 g  1  . timolol (TIMOPTIC) 0.5 % ophthalmic solution        Family History  Problem Relation Age of Onset  . Coronary artery disease Neg Hx     premature   History   Social History  . Marital Status: Single    Spouse Name: N/A    Number of Children: 1  . Years of Education: N/A   Occupational History  .     Social History Main Topics  . Smoking status: Never Smoker   . Smokeless tobacco: None  . Alcohol Use: No  .  Drug Use: No  . Sexually Active: None   Other Topics Concern  . None   Social History Narrative   Originally from [place]. She has been here in the [place] since the 60's.   Review of Systems: Constitutional: Denies fever, chills, diaphoresis, appetite change and fatigue.    Respiratory: Denies SOB, DOE, cough, chest tightness,  and wheezing.   Cardiovascular: Denies chest pain, palpitations and leg swelling.  Gastrointestinal: Denies nausea, vomiting, abdominal pain, diarrhea, constipation, blood in stool and abdominal distention.  Genitourinary: Denies dysuria, urgency, frequency, hematuria, flank pain and difficulty urinating.  Musculoskeletal: Noted myalgias, back pain, joint swelling, arthralgias and gait problem.  Skin: Denies pallor, rash and wound.  Neurological: Denies dizziness,  light-headedness,  and headaches.   Objective:  Physical Exam: Filed Vitals:   03/12/12 1336  BP: 137/75  Pulse: 66  Temp: 97.6 F (36.4 C)  TempSrc: Oral  Height: 5' 3.4" (1.61 m)  Weight: 150 lb  (68.04 kg)  SpO2: 99%   Constitutional: Vital signs reviewed.  Patient is a well-developed and well-nourished woman in no acute distress and cooperative with exam. Alert and oriented x3.  Neck: Supple,  Cardiovascular: RRR, S1 normal, S2 normal, no MRG, pulses symmetric and intact bilaterally Pulmonary/Chest: CTAB, no wheezes, rales, or rhonchi Abdominal: Soft. Non-tender, non-distended, bowel sounds are normal,  GU: no CVA tenderness Musculoskeletal: Significant tenderness on palpation paraspinal thorax and lumbar area.  Neurological: A&O x3, Strenght is 4/5 symmetric bilaterally, no focal motor deficit, sensory intact to light touch bilaterally.  Skin: Warm, dry and intact. No rash, cyanosis, or clubbing.  Psychiatric: Normal mood and affect. speech and behavior is normal.

## 2012-03-19 ENCOUNTER — Telehealth: Payer: Self-pay | Admitting: *Deleted

## 2012-03-19 NOTE — Assessment & Plan Note (Signed)
Patient mood and spirits is very good today. Patient reports the Remeron did not work at all therefore will discontinue today. She is trying to go to an adult daycare which I think reached. She needs to get up more.

## 2012-03-19 NOTE — Telephone Encounter (Signed)
Pt wants to go back on Lidoderm 5% for left hip pain. [facility]

## 2012-03-19 NOTE — Assessment & Plan Note (Signed)
Patient was not able to get lidocaine patches do to insurance restriction. Patient noted he actually did not improve his symptoms significantly. We contacted  Dr. Callie Fielding [facility]) for epidural injection. The office would like to review the case and will call the patient for an appointment. I think at this point this would be the best option for [name].

## 2012-03-19 NOTE — Telephone Encounter (Signed)
Patient's Insurance does not pay for Lidoderm patches. Called the patient and she will wait until she will get an appointment for an epidural injection.

## 2012-03-20 NOTE — Telephone Encounter (Signed)
Talked with pt - will call the doctor's office this AM - to see if she can sch injection. Pt aware Lidoderm denied per Dr Loistine Chance at this time since insurance will not pay.

## 2012-03-30 ENCOUNTER — Telehealth: Payer: Self-pay | Admitting: *Deleted

## 2012-03-30 NOTE — Telephone Encounter (Signed)
Prior Authorization repeal forms to be faxed to [facility] to complete for pt's Lidoderm 5% Patches.  Angelina Ok, RN 03/30/2012 2:17 PM.

## 2012-05-21 ENCOUNTER — Other Ambulatory Visit: Payer: Self-pay | Admitting: *Deleted

## 2012-05-21 DIAGNOSIS — I10 Essential (primary) hypertension: Secondary | ICD-10-CM

## 2012-05-22 MED ORDER — METOPROLOL TARTRATE 25 MG PO TABS: 25.0000 mg | ORAL_TABLET | Freq: Two times a day (BID) | ORAL | Status: DC

## 2012-05-25 ENCOUNTER — Other Ambulatory Visit (HOSPITAL_BASED_OUTPATIENT_CLINIC_OR_DEPARTMENT_OTHER): Payer: Medicare Other | Admitting: Lab

## 2012-05-25 DIAGNOSIS — D649 Anemia, unspecified: Secondary | ICD-10-CM

## 2012-05-25 DIAGNOSIS — M549 Dorsalgia, unspecified: Secondary | ICD-10-CM

## 2012-05-25 LAB — CBC WITH DIFFERENTIAL/PLATELET
Basophils Absolute: 0.1 10*3/uL (ref 0.0–0.1)
Eosinophils Absolute: 0.1 10*3/uL (ref 0.0–0.5)
HCT: 30.2 % — ABNORMAL LOW (ref 34.8–46.6)
HGB: 9.4 g/dL — ABNORMAL LOW (ref 11.6–15.9)
MCV: 78.3 fL — ABNORMAL LOW (ref 79.5–101.0)
MONO%: 14.9 % — ABNORMAL HIGH (ref 0.0–14.0)
NEUT#: 2.5 10*3/uL (ref 1.5–6.5)
NEUT%: 39.6 % (ref 38.4–76.8)
RDW: 17.7 % — ABNORMAL HIGH (ref 11.2–14.5)
lymph#: 2.7 10*3/uL (ref 0.9–3.3)

## 2012-05-25 LAB — IRON AND TIBC: TIBC: 485 ug/dL — ABNORMAL HIGH (ref 250–470)

## 2012-06-19 ENCOUNTER — Other Ambulatory Visit: Payer: Self-pay | Admitting: Internal Medicine

## 2012-06-19 DIAGNOSIS — K5900 Constipation, unspecified: Secondary | ICD-10-CM

## 2012-07-30 ENCOUNTER — Ambulatory Visit (HOSPITAL_COMMUNITY)
Admission: RE | Admit: 2012-07-30 | Discharge: 2012-07-30 | Disposition: A | Discharge status: Home / Self Care | Payer: Medicare Other | Source: Ambulatory Visit | Attending: Internal Medicine | Admitting: Internal Medicine

## 2012-07-30 ENCOUNTER — Encounter (HOSPITAL_COMMUNITY): Payer: Self-pay

## 2012-07-30 ENCOUNTER — Encounter: Payer: Self-pay | Admitting: Internal Medicine

## 2012-07-30 ENCOUNTER — Ambulatory Visit (INDEPENDENT_AMBULATORY_CARE_PROVIDER_SITE_OTHER): Payer: Medicare Other | Admitting: Internal Medicine

## 2012-07-30 VITALS — BP 129/79 | HR 66 | Temp 97.6°F | Ht 63.4 in | Wt 133.2 lb

## 2012-07-30 DIAGNOSIS — Z9181 History of falling: Secondary | ICD-10-CM | POA: Insufficient documentation

## 2012-07-30 DIAGNOSIS — Z23 Encounter for immunization: Secondary | ICD-10-CM | POA: Insufficient documentation

## 2012-07-30 DIAGNOSIS — I10 Essential (primary) hypertension: Secondary | ICD-10-CM

## 2012-07-30 DIAGNOSIS — M25561 Pain in right knee: Secondary | ICD-10-CM

## 2012-07-30 DIAGNOSIS — W19XXXA Unspecified fall, initial encounter: Secondary | ICD-10-CM | POA: Insufficient documentation

## 2012-07-30 DIAGNOSIS — M25569 Pain in unspecified knee: Secondary | ICD-10-CM

## 2012-07-30 DIAGNOSIS — IMO0002 Reserved for concepts with insufficient information to code with codable children: Secondary | ICD-10-CM | POA: Insufficient documentation

## 2012-07-30 DIAGNOSIS — M1710 Unilateral primary osteoarthritis, unspecified knee: Secondary | ICD-10-CM | POA: Insufficient documentation

## 2012-07-30 DIAGNOSIS — M4800 Spinal stenosis, site unspecified: Secondary | ICD-10-CM

## 2012-07-30 DIAGNOSIS — K900 Celiac disease: Secondary | ICD-10-CM

## 2012-07-30 MED ORDER — DICLOFENAC SODIUM 1 % TD GEL: 2.0000 g | Freq: Four times a day (QID) | TRANSDERMAL | Status: DC

## 2012-07-30 NOTE — Progress Notes (Signed)
Subjective:   Patient ID: [name] female   DOB: [birth year] 78 y.o.   MRN: [id number]  HPI: [name] is a 78 y.o. female with past medical history significant as outlined below who presented to the clinic for knee pain and back pain. During this office visit her aid is present.  Patient has chronic back pain and is evaluated by orthopedic and spine specialists on a regular basis. She repeatedly he noted that she does not understand why her pain is not improving and that no one wants to help her. I again informed her that her back pain is chronic and there is nothing what we could fix at this point we could only try our best to improve her pain but we have to be core she is considering her age.  Patient reports about worsening knee pain which causes trouble to walk on top of the back pain. She is also oriented about falls. Her apartment is in handicapped apartment. She uses her walker on a regular basis.   Past Medical History  Diagnosis Date  . Hypertension   . Head ache   . Renal insufficiency, mild   . Depression   . Fecal occult blood test positive   . Restless leg syndrome   . Anemia   . Cataracts, bilateral   . Hemorrhoids   . Rupture of rotator cuff of shoulder     s/p repair by Dr. Luiz Blare 7/07  . Lumbar spinal stenosis   . Postnasal drip   . Atrial tachycardia     echo 04/08/11: EF 55-60%, mild LVH, grade 1 diast dysfxn;    s/p RFCA with Dr. Ladona Ridgel  04/09/11  . Hyperkalemia 04/08/2011   Current Outpatient Prescriptions  Medication Sig Dispense Refill  . aspirin 81 MG tablet Take 81 mg by mouth daily.        Marland Kitchen diltiazem (CARDIZEM CD) 120 MG 24 hr capsule Take 1 capsule (120 mg total) by mouth daily.  30 capsule  11  . metoprolol tartrate (LOPRESSOR) 25 MG tablet Take 1 tablet (25 mg total) by mouth 2 (two) times daily.  60 tablet  3  . omeprazole (PRILOSEC) 40 MG capsule TAKE 1 CAPSULE BY MOUTH DAILY  30 capsule  3  . polyethylene glycol powder (GLYCOLAX/MIRALAX)  powder DISSOLVE 17 GRAMS IN 8OZ OF WATER OR JUICE TWICE A DAY UNTIL BOWEL MOVEMENT ACHIEVED THEN EVERY DAY  255 g  1  . timolol (TIMOPTIC) 0.5 % ophthalmic solution        Family History  Problem Relation Age of Onset  . Coronary artery disease Neg Hx     premature   History   Social History  . Marital Status: Single    Spouse Name: N/A    Number of Children: 1  . Years of Education: N/A   Occupational History  .     Social History Main Topics  . Smoking status: Never Smoker   . Smokeless tobacco: Not on file  . Alcohol Use: No  . Drug Use: No  . Sexually Active: Not on file   Other Topics Concern  . Not on file   Social History Narrative   Originally from [place]. She has been here in the [place] since the 60's.   Review of Systems: Constitutional: Denies fever, chills, diaphoresis, appetite change and fatigue.   Respiratory: Denies SOB, DOE, cough, chest tightness,  and wheezing.   Cardiovascular: Denies chest pain, palpitations and leg swelling.  Gastrointestinal: Denies  nausea, vomiting, abdominal pain, diarrhea, constipation,  Musculoskeletal:  myalgias, back pain, joint swelling, arthralgias and gait problem.  Skin: Denies pallor, rash and wound.  Neurological:  dizziness, syncope, weakness, light-headedness, numbness and headaches.    Objective:  Physical Exam: There were no vitals filed for this visit. Constitutional: Vital signs reviewed.  Patient is a well-developed and well-nourished female in no acute distress and cooperative with exam. Alert and oriented x3. .  Neck: Supple,  Cardiovascular: RRR, S1 normal, S2 normal, no MRG, pulses symmetric and intact bilaterally Pulmonary/Chest: CTAB, no wheezes, rales, or rhonchi Abdominal: Soft. Non-tender, non-distended, bowel sounds are normal,  Musculoskeletal: Right knee : Mild swelling noted with no erythema but significant tenderness on palpation. Furthermore decreased range of motion due to pain.  Again  noted is paraspinal tenderness throughout the spine worse in the lumbar area. No erythema or lesions noted. .  Neurological: A&O x3, Strength is normal and symmetric bilaterally,  no focal motor deficit, sensory intact to light touch bilaterally.  Skin: Warm, dry and intact. No rash, cyanosis, or clubbing.

## 2012-07-30 NOTE — Patient Instructions (Addendum)
1. Your were in the office for  back pain and right knee pain: We will get an x-ray of her back and knee. I will call you for results. If no broken bones I will refer you for physical therapy 2. Voltare gel 3 times a day on your back 3. Take Tramadol 25 mg 1-2 tablets a day not more 4. Try to eat regular food. Start with bread  5. Stop Omeprazol

## 2012-07-31 ENCOUNTER — Telehealth: Payer: Self-pay | Admitting: *Deleted

## 2012-07-31 NOTE — Telephone Encounter (Signed)
Call from pt. Her list of meds:  Tramadol 50mg  - 1 tab q6hr as needed for pain; Omeprazole 40mg  - 1 cap daily;                              Diltiazem 120mg  - 1 cap daily; Metoprolol 25mg  - 1 tab twice a day; Bayer ASA daily.

## 2012-08-06 DIAGNOSIS — M25561 Pain in right knee: Secondary | ICD-10-CM | POA: Insufficient documentation

## 2012-08-06 NOTE — Assessment & Plan Note (Addendum)
I'll obtain x-ray of the knee. She was prescribed tramadol from spine specialists. I recommended to at least take only half the dose if needed due to increased fall risk.  Update; Mild tricompartmental degenerative changes. I'll refer patient to sports medicine for possible injection. Patient had received steroid injection in the past which has helped. Am worried about increased risk of falls. I will refer patient for home health PT.

## 2012-08-06 NOTE — Assessment & Plan Note (Signed)
Blood pressure is well controlled. Continue current regimen. BP Readings from Last 3 Encounters:  07/30/12 129/79  03/12/12 137/75  10/20/11 113/67

## 2012-08-06 NOTE — Assessment & Plan Note (Signed)
Patient has been following a gluten-free diet since one year. She did not notice any significant changes. I recommended a trial of gluten rich diet. Patient was not sure but she will give it a trial.

## 2012-08-06 NOTE — Assessment & Plan Note (Signed)
Patient was given flu vaccination today

## 2012-08-16 ENCOUNTER — Ambulatory Visit: Payer: Medicare Other | Admitting: Sports Medicine

## 2012-08-27 ENCOUNTER — Encounter: Payer: Self-pay | Admitting: Sports Medicine

## 2012-08-27 ENCOUNTER — Ambulatory Visit (INDEPENDENT_AMBULATORY_CARE_PROVIDER_SITE_OTHER): Payer: Medicare Other | Admitting: Sports Medicine

## 2012-08-27 VITALS — BP 116/72 | HR 65 | Ht 63.5 in | Wt 133.0 lb

## 2012-08-27 DIAGNOSIS — M4800 Spinal stenosis, site unspecified: Secondary | ICD-10-CM

## 2012-08-27 DIAGNOSIS — M25569 Pain in unspecified knee: Secondary | ICD-10-CM

## 2012-08-27 MED ORDER — METHYLPREDNISOLONE ACETATE 40 MG/ML IJ SUSP
40.0000 mg | Freq: Once | INTRAMUSCULAR | Status: AC
  Administered 2012-08-27: 40 mg via INTRAMUSCULAR

## 2012-08-28 NOTE — Progress Notes (Signed)
  Subjective:    Patient ID: [name], female    DOB: Dec 15, 1924, 78 y.o.   MRN: [id number]  HPI chief complaint: Bilateral leg pain  78 year old female comes in today complaining of diffuse bilateral lower extremity pain. Pain is been present for several years. In fact she is under the care of Dr.Ibazebo and recently underwent a lumbar ESI. She was referred to our office for evaluation of her right knee but today her right knee does not hurt. She apparently fell a few days ago landing on her right knee. X-rays were obtained and are available for review. PCP referred her to our office for possible cortisone injection. Again, the patient states that her right knee is not bothering her. She is here today with her caregiver.  Past medical history is reviewed Medications are reviewed Patient is allergic to ibuprofen and oxycodone  Socially she does not smoke, does not drink alcohol, and is retired     Review of Systems     Objective:   Physical Exam Well-developed, well-nourished. No acute distress. Sitting comfortable in the exam room.  Right knee: Full range of motion with no effusion. No soft tissue swelling. Skin is intact. Knee is stable to ligamentous exam. No bony or soft tissue tenderness to palpation. No joint line tenderness. Negative McMurray's. Neurovascular intact distally. Walks with a slight limp.  X-rays of the right knee from 07/30/2012 are reviewed. They are not standing x-rays. She has mild medial joint space narrowing and sclerosis consistent with mild degenerative changes. Nothing acute is seen.       Assessment & Plan:  1. Chronic low back pain and bilateral lower extremity pain secondary to lumbar spinal stenosis  Again, the patient was referred to [place] for evaluation of her right knee. She is asymptomatic in regards to her right knee today. I recommended that she keep her followup appointment with Dr. Maurice Small for continued treatment of her lumbar spinal stenosis.  We did inject her with 40 mg of Depo-Medrol IM today. I explained to the patient that if her right knee does become symptomatic down the road we could offer her a cortisone injection here. She understands. Followup prn.

## 2012-09-10 ENCOUNTER — Encounter: Payer: Medicare Other | Admitting: Internal Medicine

## 2012-09-17 ENCOUNTER — Encounter: Payer: Self-pay | Admitting: Internal Medicine

## 2012-09-17 ENCOUNTER — Ambulatory Visit (INDEPENDENT_AMBULATORY_CARE_PROVIDER_SITE_OTHER): Payer: Medicare Other | Admitting: Internal Medicine

## 2012-09-17 VITALS — BP 116/71 | HR 72 | Temp 97.3°F | Wt 136.1 lb

## 2012-09-17 DIAGNOSIS — M48061 Spinal stenosis, lumbar region without neurogenic claudication: Secondary | ICD-10-CM

## 2012-09-17 DIAGNOSIS — I10 Essential (primary) hypertension: Secondary | ICD-10-CM

## 2012-09-17 DIAGNOSIS — G2581 Restless legs syndrome: Secondary | ICD-10-CM

## 2012-09-17 LAB — COMPREHENSIVE METABOLIC PANEL
ALT: 38 U/L — ABNORMAL HIGH (ref 0–35)
AST: 49 U/L — ABNORMAL HIGH (ref 0–37)
Albumin: 2.8 g/dL — ABNORMAL LOW (ref 3.5–5.2)
BUN: 33 mg/dL — ABNORMAL HIGH (ref 6–23)
Calcium: 7.9 mg/dL — ABNORMAL LOW (ref 8.4–10.5)
Chloride: 115 mEq/L — ABNORMAL HIGH (ref 96–112)
Potassium: 4.5 mEq/L (ref 3.5–5.3)

## 2012-09-17 NOTE — Progress Notes (Signed)
Subjective:   Patient ID: [name] female   DOB: 03/06/1925 78 y.o.   MRN: [id number]  HPI: [name] is a 77 y.o. female with past medical history significant as outlined below who presented to the clinic for a followup. Patient was evaluated by sports medicine last month and was given steroid injection IM for chronic back pain. Patient again reports about weakness in her legs and does not understand the reason. A home health nurse is present and reports that she is very more file with her walker but she complains more often about her pain is somewhat her weakness in her legs which has been progressively getting worse. She had no falls reported. Patient reports the steroid injection definitely helped her pain and would like to repeat it again. Patient reports that she has been recently trouble with sleep. She would go to bed by 10:00 and wake up in the morning it to around 2-3 and is not able to go back to sleep. She does not sleep during the day   Past Medical History  Diagnosis Date  . Hypertension   . Head ache   . Renal insufficiency, mild   . Depression   . Fecal occult blood test positive   . Restless leg syndrome   . Anemia   . Cataracts, bilateral   . Hemorrhoids   . Rupture of rotator cuff of shoulder     s/p repair by Dr. Luiz Blare 7/07  . Lumbar spinal stenosis   . Postnasal drip   . Atrial tachycardia     echo 04/08/11: EF 55-60%, mild LVH, grade 1 diast dysfxn;    s/p RFCA with Dr. Ladona Ridgel  04/09/11  . Hyperkalemia 04/08/2011   Current Outpatient Prescriptions  Medication Sig Dispense Refill  . aspirin 81 MG tablet Take 81 mg by mouth daily.        . diclofenac sodium (VOLTAREN) 1 % GEL Apply 2 g topically 4 (four) times daily.  1 Tube  0  . diltiazem (CARDIZEM CD) 120 MG 24 hr capsule Take 1 capsule (120 mg total) by mouth daily.  30 capsule  11  . LIDODERM 5 % Dr Jordan Likes      . metoprolol tartrate (LOPRESSOR) 25 MG tablet Take 1 tablet (25 mg total) by mouth 2 (two)  times daily.  60 tablet  3  . polyethylene glycol powder (GLYCOLAX/MIRALAX) powder DISSOLVE 17 GRAMS IN 8OZ OF WATER OR JUICE TWICE A DAY UNTIL BOWEL MOVEMENT ACHIEVED THEN EVERY DAY  255 g  1  . timolol (TIMOPTIC) 0.5 % ophthalmic solution       . traMADol (ULTRAM) 50 MG tablet Take 50 mg by mouth every 6 (six) hours as needed. Dr Jordan Likes       Family History  Problem Relation Age of Onset  . Coronary artery disease Neg Hx     premature   History   Social History  . Marital Status: Single    Spouse Name: N/A    Number of Children: 1  . Years of Education: N/A   Occupational History  .     Social History Main Topics  . Smoking status: Never Smoker   . Smokeless tobacco: None  . Alcohol Use: No  . Drug Use: No  . Sexually Active: None   Other Topics Concern  . None   Social History Narrative   Originally from [place]. She has been here in the [place] since the 60's.   Review of Systems:  Constitutional: Denies fever, chills, diaphoresis, appetite change and fatigue.  Respiratory: Denies SOB, DOE, cough, chest tightness,  and wheezing.   Cardiovascular: Denies chest pain, palpitations and leg swelling.  Gastrointestinal: Denies nausea, vomiting, abdominal pain, diarrhea, constipation, blood in stool and abdominal distention.  Genitourinary: Denies dysuria, urgency, frequency, hematuria, flank pain and difficulty urinating.  Musculoskeletal: Noted myalgias, back pain, arthralgias and gait problem.  Skin: Denies pallor, rash and wound.  Neurological: Denies dizziness,  syncope, weakness,     Objective:  Physical Exam: Filed Vitals:   09/17/12 1417  BP: 116/71  Pulse: 72  Temp: 97.3 F (36.3 C)  TempSrc: Oral  Weight: 136 lb 1.6 oz (61.735 kg)  SpO2: 98%   Constitutional: Vital signs reviewed.  Patient is a well-developed and well-nourished female in no acute distress and cooperative with exam. Alert and oriented x3.  Neck: Supple,  Cardiovascular: RRR, S1 normal, S2  normal, no MRG, pulses symmetric and intact bilaterally Pulmonary/Chest: CTAB, no wheezes, rales, or rhonchi Abdominal: Soft. Non-tender, non-distended, bowel sounds are normal Musculoskeletal: Significant tenderness paraspinal in the lumbar area on palpation. Normal range of motion of both knees.  Neurological: A&O x3, strength is 5/5 in lower extremities.  sensation is intact to light touch.

## 2012-09-17 NOTE — Assessment & Plan Note (Signed)
This is a chronic situation. Patient has been followed up by a specialist and has been receiving epidural injections in the past. She will followup within the next 2 months. Considering her subjective feeling of weakness in her legs is most likely related to her back pain. On physical exam patient has 5 out of 5 strength is. I will continue to monitor her.

## 2012-09-17 NOTE — Patient Instructions (Signed)
Bendryl as needed for sleep Multivitamin daily

## 2012-09-21 ENCOUNTER — Other Ambulatory Visit: Payer: Self-pay | Admitting: Internal Medicine

## 2012-09-26 NOTE — Patient Instructions (Addendum)
1.  Issue:  Anemia. 2.  Most likely due to chronic kidney disease. However, there maybe a component of iron deficiency.  Your last iron panel in 05/2012 was slightly low.  I will discuss with your GI doctor to see if endoscopy or colonoscopy is indicated.  3.  Recommendation:   *  Blood transfusion within the next few days.    *  Start oral iron:  Either NuIron 150mg  by mouth twice daily or SlowFe 325mg  by mouth twice daily.  *  Consider IV iron (Feraheme) to improve your iron store much faster to improve anemia and strength.

## 2012-09-27 ENCOUNTER — Other Ambulatory Visit (HOSPITAL_BASED_OUTPATIENT_CLINIC_OR_DEPARTMENT_OTHER): Payer: Medicare Other

## 2012-09-27 ENCOUNTER — Encounter: Payer: Self-pay | Admitting: Oncology

## 2012-09-27 ENCOUNTER — Telehealth: Payer: Self-pay | Admitting: *Deleted

## 2012-09-27 ENCOUNTER — Encounter (HOSPITAL_COMMUNITY)
Admission: RE | Admit: 2012-09-27 | Discharge: 2012-09-27 | Disposition: A | Discharge status: Home / Self Care | Payer: Medicare Other | Source: Ambulatory Visit | Attending: Oncology | Admitting: Oncology

## 2012-09-27 ENCOUNTER — Ambulatory Visit (HOSPITAL_BASED_OUTPATIENT_CLINIC_OR_DEPARTMENT_OTHER): Payer: Medicare Other | Admitting: Oncology

## 2012-09-27 ENCOUNTER — Other Ambulatory Visit: Payer: Self-pay | Admitting: Oncology

## 2012-09-27 ENCOUNTER — Encounter: Payer: Self-pay | Admitting: Licensed Clinical Social Worker

## 2012-09-27 ENCOUNTER — Ambulatory Visit: Payer: Medicare Other

## 2012-09-27 VITALS — BP 119/64 | HR 68 | Temp 97.4°F | Resp 20 | Ht 63.5 in | Wt 138.4 lb

## 2012-09-27 DIAGNOSIS — D649 Anemia, unspecified: Secondary | ICD-10-CM | POA: Insufficient documentation

## 2012-09-27 DIAGNOSIS — D539 Nutritional anemia, unspecified: Secondary | ICD-10-CM

## 2012-09-27 DIAGNOSIS — D509 Iron deficiency anemia, unspecified: Secondary | ICD-10-CM

## 2012-09-27 DIAGNOSIS — M549 Dorsalgia, unspecified: Secondary | ICD-10-CM

## 2012-09-27 DIAGNOSIS — E538 Deficiency of other specified B group vitamins: Secondary | ICD-10-CM | POA: Insufficient documentation

## 2012-09-27 LAB — CBC WITH DIFFERENTIAL/PLATELET
BASO%: 0.2 % (ref 0.0–2.0)
EOS%: 1.1 % (ref 0.0–7.0)
MCH: 23.7 pg — ABNORMAL LOW (ref 25.1–34.0)
MCHC: 29.3 g/dL — ABNORMAL LOW (ref 31.5–36.0)
RDW: 20.2 % — ABNORMAL HIGH (ref 11.2–14.5)
lymph#: 2.8 10*3/uL (ref 0.9–3.3)
nRBC: 1 % — ABNORMAL HIGH (ref 0–0)

## 2012-09-27 LAB — IRON AND TIBC
Iron: 55 ug/dL (ref 42–145)
UIBC: 354 ug/dL (ref 125–400)

## 2012-09-27 LAB — COMPREHENSIVE METABOLIC PANEL (CC13)
CO2: 21 mEq/L — ABNORMAL LOW (ref 22–29)
Creatinine: 0.9 mg/dL (ref 0.6–1.1)
Glucose: 88 mg/dl (ref 70–99)
Total Bilirubin: 0.41 mg/dL (ref 0.20–1.20)
Total Protein: 5.6 g/dL — ABNORMAL LOW (ref 6.4–8.3)

## 2012-09-27 LAB — RETICULOCYTES: Retic %: 2.97 % — ABNORMAL HIGH (ref 0.70–2.10)

## 2012-09-27 LAB — VITAMIN B12: Vitamin B-12: 434 pg/mL (ref 211–911)

## 2012-09-27 MED ORDER — POLYSACCHARIDE IRON COMPLEX 150 MG PO CAPS: 150.0000 mg | ORAL_CAPSULE | Freq: Two times a day (BID) | ORAL | Status: DC

## 2012-09-27 NOTE — Progress Notes (Signed)
Patient ID: [name], female   DOB: Apr 27, 1925, 78 y.o.   MRN: [id number] CSW received call from [facility].  Pt has applied for [employer] patient assistance program for Ensure.  [employer] in need of benefit verification.  CSW provided needed diagnosis code for Celiac Disease, PTAN number.

## 2012-09-27 NOTE — Telephone Encounter (Signed)
Called pt to instruct to stop taking Aspirin per Dr. Gaylyn Rong until further notice.  Explained can contribute to bleeding.  Pt verbalized understanding to stop aspirin.  She confirmed her appt for blood transfusion tomorrow at 10 am.

## 2012-09-27 NOTE — Telephone Encounter (Signed)
Per staff message and POF I have scheduled appts.  JMW  

## 2012-09-27 NOTE — Progress Notes (Signed)
[facility] OFFICE PROGRESS NOTE  Cc:  Almyra Deforest, MD  DIAGNOSIS:  Anemia most likely secondary to anemia of chronic kidney disease vs. Component of celiac sprue vs. Ineffective erythropoeisis due to her age.  CURRENT TREATMENT:  Oral iron supplementation.   INTERVAL HISTORY: [name] 78 y.o. female returns for regular follow up with a caregiver.  She reports that for the last few months, she has been having more fatigue.  She had negative colonoscopy with Dr. Loreta Ave in 2012.  She was thought to have celiac sprue and was on gluten-free diet which she did not like.  Recently, she was thought not to have Celiac sprue and she has returned to regular diet. She has lactose intolerance and would develop diarrhea with dairy products.  However, without taking dairy products, she does not have diarrhea.  She has generalized fatigue; mild SOB; and DOE.  She specifically denied visible source of bleeding. She has some left hip pain but not bad enough to take any pain meds for.  She denied leg weakness or numbness.   Patient denies fever, anorexia, weight loss, headache, visual changes, confusion, drenching night sweats, palpable lymph node swelling, mucositis, odynophagia, dysphagia, nausea vomiting, jaundice, chest pain, palpitation, productive cough, gum bleeding, epistaxis, hematemesis, hemoptysis, abdominal pain, abdominal swelling, early satiety, melena, hematochezia, hematuria, skin rash, spontaneous bleeding, heat or cold intolerance, bowel bladder incontinence, depression.     MEDICAL HISTORY: Past Medical History  Diagnosis Date  . Hypertension   . Head ache   . Renal insufficiency, mild   . Depression   . Fecal occult blood test positive   . Restless leg syndrome   . Anemia   . Cataracts, bilateral   . Hemorrhoids   . Rupture of rotator cuff of shoulder     s/p repair by Dr. Luiz Blare 7/07  . Lumbar spinal stenosis   . Postnasal drip   . Atrial tachycardia    echo 04/08/11: EF 55-60%, mild LVH, grade 1 diast dysfxn;    s/p RFCA with Dr. Ladona Ridgel  04/09/11  . Hyperkalemia 04/08/2011  . Unspecified deficiency anemia     SURGICAL HISTORY:  Past Surgical History  Procedure Date  . Doppler echocardiography 2004    MEDICATIONS: Current Outpatient Prescriptions  Medication Sig Dispense Refill  . aspirin 81 MG tablet Take 81 mg by mouth daily.        Marland Kitchen diltiazem (CARDIZEM CD) 120 MG 24 hr capsule Take 1 capsule (120 mg total) by mouth daily.  30 capsule  11  . LIDODERM 5 % Dr Jordan Likes      . metoprolol tartrate (LOPRESSOR) 25 MG tablet TAKE 1 TABLET (25 MG TOTAL) BY MOUTH 2 (TWO) TIMES DAILY.  60 tablet  3  . polyethylene glycol powder (GLYCOLAX/MIRALAX) powder DISSOLVE 17 GRAMS IN 8OZ OF WATER OR JUICE TWICE A DAY UNTIL BOWEL MOVEMENT ACHIEVED THEN EVERY DAY  255 g  1  . timolol (TIMOPTIC) 0.5 % ophthalmic solution       . iron polysaccharides (FERREX 150) 150 MG capsule Take 1 capsule (150 mg total) by mouth 2 (two) times daily.  60 capsule  3    ALLERGIES:  is allergic to ibuprofen and oxycodone hcl er.  REVIEW OF SYSTEMS:  The rest of the 14-point review of system was negative.   Filed Vitals:   09/27/12 1204  BP: 119/64  Pulse: 68  Temp: 97.4 F (36.3 C)  Resp: 20   Wt Readings from Last  3 Encounters:  09/27/12 138 lb 6.4 oz (62.778 kg)  09/17/12 136 lb 1.6 oz (61.735 kg)  08/27/12 133 lb (60.328 kg)   ECOG Performance status: 1  PHYSICAL EXAMINATION:  General:  Thin-appearing woman in no acute distress.  Eyes:  no scleral icterus.  ENT:  There were no oropharyngeal lesions.  Neck was without thyromegaly.  Lymphatics:  Negative cervical, supraclavicular or axillary adenopathy.  Respiratory: lungs were clear bilaterally without wheezing or crackles.  Cardiovascular:  Regular rate and rhythm, S1/S2, without murmur, rub or gallop.  There was no pedal edema.  GI:  abdomen was soft, flat, nontender, nondistended, without organomegaly.  Rectal  exam in the presence of nursing staff Ms. Cameo Hale Bogus showed no rectal or anal mass.  Stool was brown but Guaiac hem positive.   Muscoloskeletal:  no spinal tenderness of palpation of vertebral spine.  Skin exam was without echymosis, petichae.  Neuro exam was nonfocal.  Patient needed some help to  get on and off exam table.  Gait was normal.  Patient was alerted and oriented.  Attention was good.   Language was appropriate.  Mood was normal without depression.  Speech was not pressured.  Thought content was not tangential.     LABORATORY/RADIOLOGY DATA:  Lab Results  Component Value Date   WBC 6.6 09/27/2012   HGB 6.1* 09/27/2012   HCT 20.8* 09/27/2012   PLT 157 09/27/2012   GLUCOSE 88 09/27/2012   CHOL  Value: 116        ATP III CLASSIFICATION:  <200     mg/dL   Desirable  161-096  mg/dL   Borderline High  >=045    mg/dL   High        01/16/8118   TRIG 190* 01/08/2011   HDL 43 01/08/2011   LDLCALC  Value: 35        Total Cholesterol/HDL:CHD Risk Coronary Heart Disease Risk Table                     Men   Women  1/2 Average Risk   3.4   3.3  Average Risk       5.0   4.4  2 X Average Risk   9.6   7.1  3 X Average Risk  23.4   11.0        Use the calculated Patient Ratio above and the CHD Risk Table to determine the patient's CHD Risk.        ATP III CLASSIFICATION (LDL):  <100     mg/dL   Optimal  147-829  mg/dL   Near or Above                    Optimal  130-159  mg/dL   Borderline  562-130  mg/dL   High  >865     mg/dL   Very High 7/84/6962   ALT 37 09/27/2012   AST 55* 09/27/2012   NA 140 09/27/2012   K 4.0 09/27/2012   CL 113* 09/27/2012   CREATININE 0.9 09/27/2012   BUN 18.0 09/27/2012   CO2 21* 09/27/2012   TSH 0.768 01/07/2011   HGBA1C  Value: 6.0 (NOTE)  According to the ADA Clinical Practice Recommendations for 2011, when HbA1c is used as a screening test:   >=6.5%   Diagnostic of Diabetes Mellitus           (if  abnormal result  is confirmed)  5.7-6.4%   Increased risk of developing Diabetes Mellitus  References:Diagnosis and Classification of Diabetes Mellitus,Diabetes Care,2011,34(Suppl 1):S62-S69 and Standards of Medical Care in         Diabetes - 2011,Diabetes Care,2011,34  (Suppl 1):S11-S61.* 01/08/2011    ASSESSMENT AND PLAN:   1. History of chronic anemia with negative extensive work up with lab test and GI eval except for possible celiac sprue.  Up until today, her Hgb had been stable.  She had slight iron deficiency with iron panel checked by other physician in 05/2012.  Her Hgb today is worsened with symptomatic anemia.   - Potential causes:  Was thought to be due to chronic kidney disease and dyserythropoieses.  However, there maybe a component of iron deficiency.  I discussed with Dr. Loreta Ave who graciously agreed to evaluate patient again for work up.  -Recommendation:   *  Blood transfusion within the next few days here at the [facility].    *  Start oral iron:  Either NuIron 150mg  by mouth twice daily or SlowFe 325mg  by mouth twice daily.  *  IV iron (Feraheme) to improve iron store much faster to improve anemia and strength.  *  Stop Aspirin.    *  If despite iron repletion and if negative GI work up and still severely anemic, we may consider bone marrow biopsy in the future.   2. Hypertension.  She is on diltiazem and metoprolol per PCP.   3. History of renal insufficiency from presumed chronic hypertension.  Cr today is stable. She will continue to follow with PCP. 4. Follow up:  [facility] 2 weeks to see if she needs pRBC transfusion.  Return visit in about 3 months.     The length of time of the face-to-face encounter was 30 minutes. More than 50% of time was spent counseling and coordination of care.

## 2012-09-28 ENCOUNTER — Other Ambulatory Visit: Payer: Self-pay | Admitting: Oncology

## 2012-09-28 ENCOUNTER — Ambulatory Visit (HOSPITAL_BASED_OUTPATIENT_CLINIC_OR_DEPARTMENT_OTHER): Payer: Medicare Other

## 2012-09-28 VITALS — BP 148/78 | HR 65 | Temp 98.0°F | Resp 18

## 2012-09-28 DIAGNOSIS — D649 Anemia, unspecified: Secondary | ICD-10-CM

## 2012-09-28 DIAGNOSIS — D509 Iron deficiency anemia, unspecified: Secondary | ICD-10-CM

## 2012-09-28 MED ORDER — SODIUM CHLORIDE 0.9 % IV SOLN
250.0000 mL | Freq: Once | INTRAVENOUS | Status: AC
  Administered 2012-09-28: 250 mL via INTRAVENOUS

## 2012-09-28 MED ORDER — FERUMOXYTOL INJECTION 510 MG/17 ML
1020.0000 mg | Freq: Once | INTRAVENOUS | Status: AC
  Administered 2012-09-28: 1020 mg via INTRAVENOUS
  Filled 2012-09-28: qty 34

## 2012-09-28 MED ORDER — ACETAMINOPHEN 325 MG PO TABS
650.0000 mg | ORAL_TABLET | Freq: Once | ORAL | Status: AC
  Administered 2012-09-28: 650 mg via ORAL

## 2012-09-28 MED ORDER — DIPHENHYDRAMINE HCL 25 MG PO CAPS
25.0000 mg | ORAL_CAPSULE | Freq: Once | ORAL | Status: AC
  Administered 2012-09-28: 25 mg via ORAL

## 2012-09-28 NOTE — Patient Instructions (Addendum)
Blood Transfusion Information WHAT IS A BLOOD TRANSFUSION? A transfusion is the replacement of blood or some of its parts. Blood is made up of multiple cells which provide different functions.  Red blood cells carry oxygen and are used for blood loss replacement.  White blood cells fight against infection.  Platelets control bleeding.  Plasma helps clot blood.  Other blood products are available for specialized needs, such as hemophilia or other clotting disorders. BEFORE THE TRANSFUSION  Who gives blood for transfusions?   You may be able to donate blood to be used at a later date on yourself (autologous donation).  Relatives can be asked to donate blood. This is generally not any safer than if you have received blood from a stranger. The same precautions are taken to ensure safety when a relative's blood is donated.  Healthy volunteers who are fully evaluated to make sure their blood is safe. This is blood bank blood. Transfusion therapy is the safest it has ever been in the practice of medicine. Before blood is taken from a donor, a complete history is taken to make sure that person has no history of diseases nor engages in risky social behavior (examples are intravenous drug use or sexual activity with multiple partners). The donor's travel history is screened to minimize risk of transmitting infections, such as malaria. The donated blood is tested for signs of infectious diseases, such as HIV and hepatitis. The blood is then tested to be sure it is compatible with you in order to minimize the chance of a transfusion reaction. If you or a relative donates blood, this is often done in anticipation of surgery and is not appropriate for emergency situations. It takes many days to process the donated blood. RISKS AND COMPLICATIONS Although transfusion therapy is very safe and saves many lives, the main dangers of transfusion include:   Getting an infectious disease.  Developing a  transfusion reaction. This is an allergic reaction to something in the blood you were given. Every precaution is taken to prevent this. The decision to have a blood transfusion has been considered carefully by your caregiver before blood is given. Blood is not given unless the benefits outweigh the risks. AFTER THE TRANSFUSION  Right after receiving a blood transfusion, you will usually feel much better and more energetic. This is especially true if your red blood cells have gotten low (anemic). The transfusion raises the level of the red blood cells which carry oxygen, and this usually causes an energy increase.  The nurse administering the transfusion will monitor you carefully for complications. HOME CARE INSTRUCTIONS  No special instructions are needed after a transfusion. You may find your energy is better. Speak with your caregiver about any limitations on activity for underlying diseases you may have. SEEK MEDICAL CARE IF:   Your condition is not improving after your transfusion.  You develop redness or irritation at the intravenous (IV) site. SEEK IMMEDIATE MEDICAL CARE IF:  Any of the following symptoms occur over the next 12 hours:  Shaking chills.  You have a temperature by mouth above 102 F (38.9 C), not controlled by medicine.  Chest, back, or muscle pain.  People around you feel you are not acting correctly or are confused.  Shortness of breath or difficulty breathing.  Dizziness and fainting.  You get a rash or develop hives.  You have a decrease in urine output.  Your urine turns a dark color or changes to pink, red, or brown. Any of the following   symptoms occur over the next 10 days:  You have a temperature by mouth above 102 F (38.9 C), not controlled by medicine.  Shortness of breath.  Weakness after normal activity.  The white part of the eye turns yellow (jaundice).  You have a decrease in the amount of urine or are urinating less often.  Your  urine turns a dark color or changes to pink, red, or brown. Document Released: 09/23/2000 Document Revised: 12/19/2011 Document Reviewed: 05/12/2008 ExitCare Patient Information 2013 [employer]. Ferumoxytol injection What is this medicine? FERUMOXYTOL is an iron complex. Iron is used to make healthy red blood cells, which carry oxygen and nutrients throughout the body. This medicine is used to treat iron deficiency anemia in people with chronic kidney disease. This medicine may be used for other purposes; ask your health care provider or pharmacist if you have questions. What should I tell my health care provider before I take this medicine? They need to know if you have any of these conditions: -anemia not caused by low iron levels -high levels of iron in the blood -magnetic resonance imaging (MRI) test scheduled -an unusual or allergic reaction to iron, other medicines, foods, dyes, or preservatives -pregnant or trying to get pregnant -breast-feeding How should I use this medicine? This medicine is for infusion into a vein. It is given by a health care professional in a hospital or clinic setting. Talk to your pediatrician regarding the use of this medicine in children. Special care may be needed. Overdosage: If you think you've taken too much of this medicine contact a poison control center or emergency room at once. Overdosage: If you think you have taken too much of this medicine contact a poison control center or emergency room at once. NOTE: This medicine is only for you. Do not share this medicine with others. What if I miss a dose? It is important not to miss your dose. Call your doctor or health care professional if you are unable to keep an appointment. What may interact with this medicine? This medicine may interact with the following medications: -other iron products This list may not describe all possible interactions. Give your health care provider a list of all the  medicines, herbs, non-prescription drugs, or dietary supplements you use. Also tell them if you smoke, drink alcohol, or use illegal drugs. Some items may interact with your medicine. What should I watch for while using this medicine? Visit your doctor or healthcare professional regularly. Tell your doctor or healthcare professional if your symptoms do not start to get better or if they get worse. You may need blood work done while you are taking this medicine. You may need to follow a special diet. Talk to your doctor. Foods that contain iron include: whole grains/cereals, dried fruits, beans, or peas, leafy green vegetables, and organ meats (liver, kidney). What side effects may I notice from receiving this medicine? Side effects that you should report to your doctor or health care professional as soon as possible: -allergic reactions like skin rash, itching or hives, swelling of the face, lips, or tongue -breathing problems -changes in blood pressure -feeling faint or lightheaded, falls -fever or chills -flushing, sweating, or hot feelings -swelling of the ankles or feet Side effects that usually do not require medical attention (Report these to your doctor or health care professional if they continue or are bothersome.): -diarrhea -headache -nausea, vomiting -stomach pain This list may not describe all possible side effects. Call your doctor for medical advice   about side effects. You may report side effects to FDA at 1-800-FDA-1088. Where should I keep my medicine? This drug is given in a hospital or clinic and will not be stored at home. NOTE: This sheet is a summary. It may not cover all possible information. If you have questions about this medicine, talk to your doctor, pharmacist, or health care provider.  2012, [employer]. (06/18/2008 9:48:25 PM) 

## 2012-09-29 LAB — TYPE AND SCREEN
ABO/RH(D): O POS
Antibody Screen: NEGATIVE
Unit division: 0
Unit division: 0

## 2012-10-01 LAB — KAPPA/LAMBDA LIGHT CHAINS
Kappa:Lambda Ratio: 0.9 (ref 0.26–1.65)
Lambda Free Lght Chn: 4.98 mg/dL — ABNORMAL HIGH (ref 0.57–2.63)

## 2012-10-01 LAB — PROTEIN ELECTROPHORESIS, SERUM
Albumin ELP: 46.1 % — ABNORMAL LOW (ref 55.8–66.1)
Alpha-2-Globulin: 10.3 % (ref 7.1–11.8)
Beta Globulin: 9 % — ABNORMAL HIGH (ref 4.7–7.2)
Total Protein, Serum Electrophoresis: 5.8 g/dL — ABNORMAL LOW (ref 6.0–8.3)

## 2012-10-02 LAB — POC HEMOCCULT BLD/STL (HOME/3-CARD/SCREEN)

## 2012-10-08 ENCOUNTER — Encounter: Payer: Self-pay | Admitting: Internal Medicine

## 2012-10-08 ENCOUNTER — Ambulatory Visit (INDEPENDENT_AMBULATORY_CARE_PROVIDER_SITE_OTHER): Payer: Medicare Other | Admitting: Internal Medicine

## 2012-10-08 VITALS — BP 129/71 | HR 61 | Temp 96.9°F | Ht 62.0 in | Wt 132.1 lb

## 2012-10-08 DIAGNOSIS — M25519 Pain in unspecified shoulder: Secondary | ICD-10-CM

## 2012-10-08 DIAGNOSIS — Z7409 Other reduced mobility: Secondary | ICD-10-CM | POA: Insufficient documentation

## 2012-10-08 DIAGNOSIS — R69 Illness, unspecified: Secondary | ICD-10-CM

## 2012-10-08 DIAGNOSIS — Z4689 Encounter for fitting and adjustment of other specified devices: Secondary | ICD-10-CM

## 2012-10-08 DIAGNOSIS — M25512 Pain in left shoulder: Secondary | ICD-10-CM | POA: Insufficient documentation

## 2012-10-08 DIAGNOSIS — M25511 Pain in right shoulder: Secondary | ICD-10-CM

## 2012-10-08 NOTE — Patient Instructions (Addendum)
You can take Acetaminophen ( tyelnol ) 325 mg ( 1-2 tablets every 6 hours as needed for pain) Please use heat patches.  No use of ibuprofen

## 2012-10-08 NOTE — Assessment & Plan Note (Signed)
Due to significant gait instability and significant back and shoulder pain patient has required a walker. At this point she is no longer able to safely operate a cane or walker as well as a manuel wheelchair due to high risk of falls considering her worsening gait instability, right upper extremity strength of 3/4 and left upper extremity strength of 2/5 and pain of 8-10/10 in severity . She no longer can move from room to room safely to make her food or do toileting. The patient cannot safely transfer into and out of a scooter (POV) because of her significant limitations in muscle strength with UE strength of 2-3/5 and pain in th shoulder between 8-10/10. The patient is alert and cooperative and feels that she could safely operate a power wheelchair in the home. The patient is willing and motivated to use the power wheelchair. Patient is high risk for falls.   For the above-mentioned reasons, I agree that [name] would benefit from motorized wheelchair. she has limitation in multiple mobility related activities of daily living and therefore, in my opinion would be a good candidate for motorized wheelchair.

## 2012-10-08 NOTE — Progress Notes (Signed)
  Subjective:   Patient ID: [name] female   DOB: 12-Dec-1924 78 y.o.   MRN: [id number]  HPI: [name] is a 78 y.o. female ho presented to the clinic for evaluation for power wheelchair and shoulder pain.  1. Impaired mobility: Patient noted that she has been experiencing significant worsening pain in her back and shoulders bilaterally that she is hardly able to get around the house. She further noted that she does not fell stable enough to use the walk  stability  has been gradually declining.  She is not able to move from room to room safely without high risk of falls.  2. Shoulder pain: has been getting worse and she is not due for the next injection . She noted the pain in the left shoulder is 10/10 and the right shoulder is 8/10   3. Anemia: received 2 units of PRBC beginning of this month   Current Outpatient Prescriptions  Medication Sig Dispense Refill  . aspirin 81 MG tablet Take 81 mg by mouth daily.        Marland Kitchen diltiazem (CARDIZEM CD) 120 MG 24 hr capsule Take 1 capsule (120 mg total) by mouth daily.  30 capsule  11  . iron polysaccharides (FERREX 150) 150 MG capsule Take 1 capsule (150 mg total) by mouth 2 (two) times daily.  60 capsule  3  . LIDODERM 5 % Dr Jordan Likes      . metoprolol tartrate (LOPRESSOR) 25 MG tablet TAKE 1 TABLET (25 MG TOTAL) BY MOUTH 2 (TWO) TIMES DAILY.  60 tablet  3  . polyethylene glycol powder (GLYCOLAX/MIRALAX) powder DISSOLVE 17 GRAMS IN 8OZ OF WATER OR JUICE TWICE A DAY UNTIL BOWEL MOVEMENT ACHIEVED THEN EVERY DAY  255 g  1  . timolol (TIMOPTIC) 0.5 % ophthalmic solution        Review of Systems: Constitutional: Denies fever, chills, diaphoresis but noted appetite change and fatigue. Marland Kitchen   Respiratory: Denies SOB, DOE, cough, chest tightness,  and wheezing.   Cardiovascular: Denies chest pain, palpitations and leg swelling.  Gastrointestinal: noted some abdominal distention  Musculoskeletal: noted myalgias, back pain, joint swelling, arthralgias and  gait problem.   Objective:  Physical Exam: Filed Vitals:   10/08/12 1329  BP: 129/71  Pulse: 61  Temp: 96.9 F (36.1 C)  TempSrc: Oral  Weight: 132 lb 1.6 oz (59.92 kg)  SpO2: 98%   Constitutional: Vital signs reviewed.  Patient is a well-developed and well-nourished woman in no acute distress and cooperative with exam. Alert and oriented x3.  Head: Normocephalic and atraumatic Mouth: no erythema or exudates, MMM Eyes: PERRL, EOMI, conjunctivae normal, Neck: Supple, .  Cardiovascular: RRR, S1 normal, S2 normal, no MRG, pulses symmetric and intact bilaterally Pulmonary/Chest: CTAB, no wheezes, rales, or rhonchi Abdominal: Soft. Non-tender, non-distended, bowel sounds are normal, Musculoskeletal: Strength: RUE 3/5. LUE 2/5, RLE 4/5 , LLE 4/5 ROM: Decrease in Left >Right shoulder due to pain  Significant pain with palpation of lower back area on palpation  Neurological: A&O x3,  cranial nerve II-XII are grossly intact,  sensory intact to light touch bilaterally.  Unsteady gait. Negative Romberg.  Psychiatric: Normal mood and affect. speech and behavior is normal.

## 2012-10-09 ENCOUNTER — Other Ambulatory Visit: Payer: Self-pay | Admitting: Oncology

## 2012-10-09 DIAGNOSIS — D649 Anemia, unspecified: Secondary | ICD-10-CM

## 2012-10-10 DIAGNOSIS — Z9289 Personal history of other medical treatment: Secondary | ICD-10-CM

## 2012-10-10 HISTORY — DX: Personal history of other medical treatment: Z92.89

## 2012-10-11 ENCOUNTER — Telehealth: Payer: Self-pay | Admitting: *Deleted

## 2012-10-11 ENCOUNTER — Other Ambulatory Visit (HOSPITAL_BASED_OUTPATIENT_CLINIC_OR_DEPARTMENT_OTHER): Payer: Medicare Other

## 2012-10-11 DIAGNOSIS — D649 Anemia, unspecified: Secondary | ICD-10-CM

## 2012-10-11 LAB — CBC WITH DIFFERENTIAL/PLATELET
EOS%: 0.2 % (ref 0.0–7.0)
Eosinophils Absolute: 0 10*3/uL (ref 0.0–0.5)
LYMPH%: 30 % (ref 14.0–49.7)
MCH: 27.8 pg (ref 25.1–34.0)
MCV: 87.2 fL (ref 79.5–101.0)
MONO%: 15 % — ABNORMAL HIGH (ref 0.0–14.0)
Platelets: 270 10*3/uL (ref 145–400)
RBC: 4.14 10*6/uL (ref 3.70–5.45)
RDW: 23.2 % — ABNORMAL HIGH (ref 11.2–14.5)

## 2012-10-11 NOTE — Telephone Encounter (Signed)
Spoke w/ pt and caregiver in lobby.  Results of lab given and relayed Dr. Lodema Pilot message below.  They verbalized understanding. [name]

## 2012-10-11 NOTE — Telephone Encounter (Signed)
Message copied by Wende Mott on Thu Oct 11, 2012  2:15 PM ------      Message from: HA, Raliegh Ip T      Created: Thu Oct 11, 2012  2:05 PM       Please call pt.  Her Hgb is much better today.  She had iron deficiency anemia which improved with transfusion and IV iron last week.  Will continue to check CBC every 2 weeks for now.  Will space it out in the future once it's stable.  Thanks.

## 2012-10-12 LAB — HOLD TUBE, BLOOD BANK

## 2012-10-18 ENCOUNTER — Other Ambulatory Visit: Payer: Self-pay | Admitting: Internal Medicine

## 2012-10-18 DIAGNOSIS — K5900 Constipation, unspecified: Secondary | ICD-10-CM

## 2012-10-24 ENCOUNTER — Other Ambulatory Visit: Payer: Self-pay | Admitting: Oncology

## 2012-10-24 DIAGNOSIS — E611 Iron deficiency: Secondary | ICD-10-CM

## 2012-10-25 ENCOUNTER — Telehealth: Payer: Self-pay | Admitting: *Deleted

## 2012-10-25 ENCOUNTER — Other Ambulatory Visit (HOSPITAL_BASED_OUTPATIENT_CLINIC_OR_DEPARTMENT_OTHER): Payer: Medicare Other | Admitting: Lab

## 2012-10-25 DIAGNOSIS — D509 Iron deficiency anemia, unspecified: Secondary | ICD-10-CM

## 2012-10-25 DIAGNOSIS — E611 Iron deficiency: Secondary | ICD-10-CM

## 2012-10-25 LAB — CBC WITH DIFFERENTIAL/PLATELET
Basophils Absolute: 0 10*3/uL (ref 0.0–0.1)
Eosinophils Absolute: 0.1 10*3/uL (ref 0.0–0.5)
HGB: 12 g/dL (ref 11.6–15.9)
MCV: 87.6 fL (ref 79.5–101.0)
MONO#: 0.8 10*3/uL (ref 0.1–0.9)
NEUT#: 6 10*3/uL (ref 1.5–6.5)
Platelets: 164 10*3/uL (ref 145–400)
RBC: 4.35 10*6/uL (ref 3.70–5.45)
RDW: 21.2 % — ABNORMAL HIGH (ref 11.2–14.5)
WBC: 10.3 10*3/uL (ref 3.9–10.3)

## 2012-10-25 NOTE — Telephone Encounter (Signed)
Called pt w/ Dr. Lodema Pilot message and canceling q 2 wk lab appts.  Confirmed next appt on 01/03/13.  She verbalized understanding.

## 2012-10-25 NOTE — Telephone Encounter (Signed)
Message copied by Wende Mott on Thu Oct 25, 2012  4:42 PM ------      Message from: HA, Raliegh Ip T      Created: Thu Oct 25, 2012  1:51 PM       Please call pt.  Her iron deficnecy anemia has resolved now with recent IV iron.  She can cancel q2wk CBC and just get lab when she see Belenda Cruise in March 2014.  Thanks.

## 2012-10-26 ENCOUNTER — Telehealth: Payer: Self-pay | Admitting: Oncology

## 2012-10-26 NOTE — Telephone Encounter (Signed)
Per 1.16.14 pof q2 week labs cancelled.Marland KitchenMarland Kitchen

## 2012-10-31 ENCOUNTER — Telehealth: Payer: Self-pay | Admitting: *Deleted

## 2012-10-31 NOTE — Telephone Encounter (Signed)
Patient called regarding her medications, I have transferred her to the desk RN.  JMW

## 2012-11-08 ENCOUNTER — Other Ambulatory Visit: Payer: Medicare Other

## 2012-11-13 ENCOUNTER — Other Ambulatory Visit: Payer: Self-pay | Admitting: Internal Medicine

## 2012-11-19 ENCOUNTER — Ambulatory Visit (INDEPENDENT_AMBULATORY_CARE_PROVIDER_SITE_OTHER): Payer: Medicare Other | Admitting: Internal Medicine

## 2012-11-19 ENCOUNTER — Encounter: Payer: Self-pay | Admitting: Internal Medicine

## 2012-11-19 VITALS — BP 136/81 | HR 59 | Temp 97.7°F | Ht 62.0 in | Wt 137.2 lb

## 2012-11-19 DIAGNOSIS — R69 Illness, unspecified: Secondary | ICD-10-CM

## 2012-11-19 DIAGNOSIS — I10 Essential (primary) hypertension: Secondary | ICD-10-CM

## 2012-11-19 DIAGNOSIS — M25519 Pain in unspecified shoulder: Secondary | ICD-10-CM

## 2012-11-19 DIAGNOSIS — M25511 Pain in right shoulder: Secondary | ICD-10-CM

## 2012-11-19 DIAGNOSIS — Z7409 Other reduced mobility: Secondary | ICD-10-CM

## 2012-11-19 MED ORDER — ENSURE COMPLETE SHAKE PO LIQD: 1.0000 | Freq: Three times a day (TID) | ORAL | Status: DC

## 2012-11-19 NOTE — Patient Instructions (Signed)
We are checking on your wheelchair and your ensure. The social worker will call about the ensure and I have sent in a prescription. Please come back in 3 months or sooner if you have any problems. Our number is [phone].

## 2012-11-19 NOTE — Assessment & Plan Note (Signed)
Awaiting [facility] number from Dr. Loistine Chance and will forward this note to her in reminder for her to complete this so she can get motorized wheelchair.

## 2012-11-19 NOTE — Progress Notes (Signed)
Subjective:     Patient ID: [name], female   DOB: May 20, 1925, 79 y.o.   MRN: [id number]  HPI The patient is an 79 YO female who comes in for a return follow up visit. She was last seen for automatic wheelchair visit and all paperwork was completed. She has still not gotten her wheelchair and wonders how long this will be. She also needs some ensure which the social worker assured her would be covered by medicare and medicaid however do not see order in computer. She is still having shoulder and knee pain which are helped by voltaren gel. No problems with her medications and no additional complaints. No chest pain or SOB. No falls at home and no nausea or vomiting.   Review of Systems  Constitutional: Positive for activity change. Negative for fever, chills, diaphoresis, appetite change, fatigue and unexpected weight change.       Pain keeps her from moving around much and prevents her from leaving the house. She is a significant fall risk when using her walker.   HENT: Negative.   Eyes: Negative.   Respiratory: Negative for cough, chest tightness, shortness of breath and wheezing.   Cardiovascular: Negative for chest pain, palpitations and leg swelling.  Gastrointestinal: Negative for nausea, vomiting and abdominal pain.  Musculoskeletal: Positive for myalgias, arthralgias and gait problem. Negative for back pain and joint swelling.  Skin: Negative for color change, pallor, rash and wound.  Neurological: Negative.   Psychiatric/Behavioral: Negative.        Objective:   Physical Exam  Constitutional: She is oriented to person, place, and time. She appears well-developed. No distress.  HENT:  Head: Normocephalic and atraumatic.  Eyes: EOM are normal. Pupils are equal, round, and reactive to light.  Neck: Normal range of motion. Neck supple.  Cardiovascular: Normal rate and normal heart sounds.   Pulmonary/Chest: Effort normal and breath sounds normal.  Abdominal: Soft. Bowel sounds  are normal. She exhibits no distension. There is no tenderness. There is no rebound.  Musculoskeletal: Normal range of motion. She exhibits tenderness. She exhibits no edema.  Much tenderness in the shoulders especially on ROM.   Neurological: She is alert and oriented to person, place, and time.  Skin: Skin is warm and dry. No rash noted. She is not diaphoretic. No erythema. No pallor.  Psychiatric: She has a normal mood and affect. Her behavior is normal.       Assessment/Plan:   1. Please see problem oriented charting.   2. Disposition - The patient will be seen back in 3 months and will forward information to social worker about ensure. Will send in rx. Will check on wheelchair and forward paperwork to PCP if any remaining. Will have patient seen back in 3 months for follow up. No changes to medications at today's visit.

## 2012-11-19 NOTE — Assessment & Plan Note (Signed)
Is worsened by having to use walker to ambulate and will likely be better with motorized wheelchair. Voltaren gel is helpful and she is not requiring additional pain medication at today's visit.

## 2012-11-19 NOTE — Assessment & Plan Note (Signed)
Blood pressure well controlled today on diltiazem and metoprolol tartrate. No changes.

## 2012-11-22 ENCOUNTER — Other Ambulatory Visit: Payer: Medicare Other

## 2012-12-03 ENCOUNTER — Other Ambulatory Visit: Payer: Self-pay | Admitting: Internal Medicine

## 2012-12-06 ENCOUNTER — Other Ambulatory Visit: Payer: Medicare Other

## 2012-12-17 ENCOUNTER — Emergency Department (HOSPITAL_COMMUNITY): Payer: Medicare Other

## 2012-12-17 ENCOUNTER — Emergency Department (HOSPITAL_COMMUNITY)
Admission: EM | Admit: 2012-12-17 | Discharge: 2012-12-17 | Disposition: A | Discharge status: Home / Self Care | Payer: Medicare Other | Attending: Emergency Medicine | Admitting: Emergency Medicine

## 2012-12-17 ENCOUNTER — Encounter (HOSPITAL_COMMUNITY): Payer: Self-pay | Admitting: Emergency Medicine

## 2012-12-17 DIAGNOSIS — G8929 Other chronic pain: Secondary | ICD-10-CM

## 2012-12-17 DIAGNOSIS — D649 Anemia, unspecified: Secondary | ICD-10-CM | POA: Insufficient documentation

## 2012-12-17 DIAGNOSIS — Z8719 Personal history of other diseases of the digestive system: Secondary | ICD-10-CM | POA: Insufficient documentation

## 2012-12-17 DIAGNOSIS — Z8739 Personal history of other diseases of the musculoskeletal system and connective tissue: Secondary | ICD-10-CM | POA: Insufficient documentation

## 2012-12-17 DIAGNOSIS — M25559 Pain in unspecified hip: Secondary | ICD-10-CM | POA: Insufficient documentation

## 2012-12-17 DIAGNOSIS — Z87448 Personal history of other diseases of urinary system: Secondary | ICD-10-CM | POA: Insufficient documentation

## 2012-12-17 DIAGNOSIS — I10 Essential (primary) hypertension: Secondary | ICD-10-CM | POA: Insufficient documentation

## 2012-12-17 DIAGNOSIS — Z8669 Personal history of other diseases of the nervous system and sense organs: Secondary | ICD-10-CM | POA: Insufficient documentation

## 2012-12-17 DIAGNOSIS — M7918 Myalgia, other site: Secondary | ICD-10-CM | POA: Diagnosis present

## 2012-12-17 DIAGNOSIS — Z8659 Personal history of other mental and behavioral disorders: Secondary | ICD-10-CM | POA: Insufficient documentation

## 2012-12-17 DIAGNOSIS — Z79899 Other long term (current) drug therapy: Secondary | ICD-10-CM | POA: Insufficient documentation

## 2012-12-17 DIAGNOSIS — Z7982 Long term (current) use of aspirin: Secondary | ICD-10-CM | POA: Insufficient documentation

## 2012-12-17 MED ORDER — HYDROCODONE-ACETAMINOPHEN 5-325 MG PO TABS: 1.0000 | ORAL_TABLET | ORAL | Status: DC | PRN

## 2012-12-17 MED ORDER — FENTANYL 12 MCG/HR TD PT72: 1.0000 | MEDICATED_PATCH | TRANSDERMAL | Status: DC

## 2012-12-17 MED ORDER — HYDROCODONE-ACETAMINOPHEN 5-325 MG PO TABS
1.0000 | ORAL_TABLET | Freq: Once | ORAL | Status: AC
  Administered 2012-12-17: 1 via ORAL
  Filled 2012-12-17: qty 1

## 2012-12-17 MED ORDER — MORPHINE SULFATE 4 MG/ML IJ SOLN
4.0000 mg | Freq: Once | INTRAMUSCULAR | Status: AC
  Administered 2012-12-17: 4 mg via INTRAMUSCULAR
  Filled 2012-12-17: qty 1

## 2012-12-17 NOTE — ED Notes (Signed)
Pt c/o increasing left hip pain x 1 week; pt denies any trauma

## 2012-12-17 NOTE — ED Provider Notes (Signed)
History     CSN: [id number]  Arrival date & time 12/17/12  4098   First MD Initiated Contact with Patient 12/17/12 [phone]      Chief Complaint  Patient presents with  . Hip Pain    (Consider location/radiation/quality/duration/timing/severity/associated sxs/prior treatment) Patient is a 79 y.o. female presenting with hip pain. The history is provided by the patient.  Hip Pain This is a new problem. The current episode started 1 to 4 weeks ago. Pertinent negatives include no chills, fever, headaches, nausea or numbness. Associated symptoms comments: Lower back pain that radiates to left hip for one week. No numbness or weakness. No known injury except "maybe I sat down too hard" but cannot specify injury. No abdominal pain. No history of similar back pain..    Past Medical History  Diagnosis Date  . Hypertension   . Head ache   . Renal insufficiency, mild   . Depression   . Fecal occult blood test positive   . Restless leg syndrome   . Anemia   . Cataracts, bilateral   . Hemorrhoids   . Rupture of rotator cuff of shoulder     s/p repair by Dr. Luiz Blare 7/07  . Lumbar spinal stenosis   . Postnasal drip   . Atrial tachycardia     echo 04/08/11: EF 55-60%, mild LVH, grade 1 diast dysfxn;    s/p RFCA with Dr. Ladona Ridgel  04/09/11  . Hyperkalemia 04/08/2011  . Unspecified deficiency anemia     Past Surgical History  Procedure Laterality Date  . Doppler echocardiography  2004    Family History  Problem Relation Age of Onset  . Coronary artery disease Neg Hx     premature    History  Substance Use Topics  . Smoking status: Never Smoker   . Smokeless tobacco: Not on file  . Alcohol Use: No    OB History   Grav Para Term Preterm Abortions TAB SAB Ect Mult Living                  Review of Systems  Constitutional: Negative for fever and chills.  Gastrointestinal: Negative.  Negative for nausea.  Musculoskeletal: Positive for back pain.  Skin: Negative.   Neurological:  Negative.  Negative for numbness and headaches.    Allergies  Codeine; Ibuprofen; and Oxycodone hcl er  Home Medications   Current Outpatient Rx  Name  Route  Sig  Dispense  Refill  . aspirin 81 MG tablet   Oral   Take 81 mg by mouth daily.           . diclofenac sodium (VOLTAREN) 1 % GEL   Topical   Apply 2 g topically 4 (four) times daily.         Marland Kitchen diltiazem (TIAZAC) 120 MG 24 hr capsule   Oral   Take 120 mg by mouth daily.         . iron polysaccharides (NIFEREX) 150 MG capsule   Oral   Take 150 mg by mouth 2 (two) times daily.         Marland Kitchen LIDODERM 5 %   Transdermal   Place 1 patch onto the skin daily. Dr Jordan Likes         . metoprolol tartrate (LOPRESSOR) 25 MG tablet   Oral   Take 25 mg by mouth 2 (two) times daily.         . polyethylene glycol powder (MIRALAX) powder   Oral   Take 17 g  by mouth 2 (two) times daily as needed. For constipation         . timolol (TIMOPTIC) 0.5 % ophthalmic solution   Right Eye   Place 1 drop into the right eye daily.          . polyethylene glycol powder (GLYCOLAX/MIRALAX) powder                 BP 140/85  Pulse 71  Temp(Src) 97.5 F (36.4 C) (Oral)  Resp 16  SpO2 100%  Physical Exam  Constitutional: She is oriented to person, place, and time. She appears well-developed and well-nourished.  HENT:  Head: Normocephalic.  Neck: Normal range of motion. Neck supple.  Cardiovascular: Normal rate and regular rhythm.   Pulmonary/Chest: Effort normal and breath sounds normal.  Abdominal: Soft. Bowel sounds are normal. There is no tenderness. There is no rebound and no guarding.  Musculoskeletal: Normal range of motion.  Mild lumbar and paralumbar tenderness without swelling or discoloration.  Neurological: She is alert and oriented to person, place, and time. She has normal reflexes. Coordination normal.  Skin: Skin is warm and dry. No rash noted.  Psychiatric: She has a normal mood and affect.    ED Course   Procedures (including critical care time)  Labs Reviewed - No data to display Dg Lumbar Spine Complete  12/17/2012  *RADIOLOGY REPORT*  Clinical Data: Sat down hard on the tub.  Left lateral pain. History of prior back surgery.  LUMBAR SPINE - COMPLETE 4+ VIEW  Comparison: 07/21/2012  Findings: The patient has had previous posterior fusion at L3-L4. There is grade 1 anterolisthesis of L4 on L5, stable in appearance. Grade 1 retrolisthesis of L2 on L3 may be progressive since previous exam, accounting for differences in positioning. Significant degenerative changes are identified at multiple levels. There is no evidence for acute fracture or dislocation.  Note is made of degenerative change of the SI joints bilaterally.  There is significant atherosclerotic calcific calcification of the abdominal aorta.  IMPRESSION:  1.  Significant degenerative change throughout the lumbar spine, associated with S-shaped scoliosis. 2.  Possible increased retrolisthesis of L2 on L3 versus changes in positioning. 3.  No evidence for acute fracture.   Original Report Authenticated By: Norva Pavlov, M.D.    Dg Pelvis 1-2 Views  12/17/2012  *RADIOLOGY REPORT*  Clinical Data: Injury.  Left lateral hip pain.  PELVIS - 1-2 VIEW  Comparison: Lumbar spine series 07/30/2012  Findings: Degenerative changes are seen in the lower lumbar spine. Status post posterior fusion.  No evidence for acute fracture or dislocation.  Bilateral SI joint sclerosis.  Regional bowel gas pattern is nonobstructive.  IMPRESSION:  1.  Degenerative changes. 2.  No No evidence for acute  abnormality.   Original Report Authenticated By: Norva Pavlov, M.D.      No diagnosis found.  1. Chronic back pain  MDM  [facility] resident consulted for evaluation for admission. They have seen patient and determine she can go home. Discussed Fentanyl patch and Norco. They will arrange follow up with primary clinic patient.        Arnoldo Hooker,  PA-C 12/17/12 1531
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
## 2012-12-17 NOTE — ED Notes (Signed)
Pt has hx of back sx x two per Dr. Jordan Likes. [phone]). C/O lower back pain to left buttock x 1 week. PCP is [facility]. No known injury. Hx of chronic pain.

## 2012-12-17 NOTE — ED Notes (Signed)
Returned from xray

## 2012-12-18 NOTE — ED Provider Notes (Signed)
Medical screening examination/treatment/procedure(s) were performed by non-physician practitioner and as supervising physician I was immediately available for consultation/collaboration.   Javian Nudd, MD 12/18/12 0750 

## 2012-12-20 ENCOUNTER — Other Ambulatory Visit: Payer: Medicare Other

## 2012-12-20 ENCOUNTER — Ambulatory Visit: Payer: Medicare Other | Admitting: Internal Medicine

## 2013-01-02 ENCOUNTER — Ambulatory Visit (INDEPENDENT_AMBULATORY_CARE_PROVIDER_SITE_OTHER): Payer: Medicare Other | Admitting: Internal Medicine

## 2013-01-02 ENCOUNTER — Encounter: Payer: Self-pay | Admitting: Internal Medicine

## 2013-01-02 VITALS — BP 121/74 | HR 63 | Temp 99.2°F | Ht 62.0 in | Wt 136.7 lb

## 2013-01-02 DIAGNOSIS — M48061 Spinal stenosis, lumbar region without neurogenic claudication: Secondary | ICD-10-CM

## 2013-01-02 MED ORDER — GABAPENTIN 100 MG PO CAPS: 100.0000 mg | ORAL_CAPSULE | Freq: Three times a day (TID) | ORAL | Status: DC

## 2013-01-02 MED ORDER — FENTANYL 12 MCG/HR TD PT72: 1.0000 | MEDICATED_PATCH | TRANSDERMAL | Status: DC

## 2013-01-02 NOTE — Progress Notes (Signed)
Subjective:   Patient ID: [name] female   DOB: 1925/01/24 79 y.o.   MRN: [id number]  HPI: [name] is a 79 y.o. woman presents to clinic today for follow up from her ED visit on 3/10.  She was seen for her back pain and underwent x-ray and evaluation.  She was discharged on a fentanyl patch 12 mcg/hr.  She states that she is still having pain down the back of her leg and her left buttock that goes all the way down to the bottom of her toes.  She denies weakness in the leg, stool or bladder incontinence, or numbness in her groin.  SHe would like to avoid surgery if she can.    Past Medical History  Diagnosis Date  . Hypertension   . Head ache   . Renal insufficiency, mild   . Depression   . Fecal occult blood test positive   . Restless leg syndrome   . Anemia   . Cataracts, bilateral   . Hemorrhoids   . Rupture of rotator cuff of shoulder     s/p repair by Dr. Luiz Blare 7/07  . Lumbar spinal stenosis   . Postnasal drip   . Atrial tachycardia     echo 04/08/11: EF 55-60%, mild LVH, grade 1 diast dysfxn;    s/p RFCA with Dr. Ladona Ridgel  04/09/11  . Hyperkalemia 04/08/2011  . Unspecified deficiency anemia    Current Outpatient Prescriptions  Medication Sig Dispense Refill  . aspirin 81 MG tablet Take 81 mg by mouth daily.        . diclofenac sodium (VOLTAREN) 1 % GEL Apply 2 g topically 4 (four) times daily.      Marland Kitchen diltiazem (TIAZAC) 120 MG 24 hr capsule Take 120 mg by mouth daily.      . fentaNYL (DURAGESIC - DOSED MCG/HR) 12 MCG/HR Place 1 patch (12.5 mcg total) onto the skin every 3 (three) days.  5 patch  0  . HYDROcodone-acetaminophen (NORCO/VICODIN) 5-325 MG per tablet Take 1 tablet by mouth every 4 (four) hours as needed for pain.  15 tablet  0  . iron polysaccharides (NIFEREX) 150 MG capsule Take 150 mg by mouth 2 (two) times daily.      Marland Kitchen LIDODERM 5 % Place 1 patch onto the skin daily. Dr Jordan Likes      . metoprolol tartrate (LOPRESSOR) 25 MG tablet Take 25 mg by mouth 2 (two)  times daily.      . polyethylene glycol powder (GLYCOLAX/MIRALAX) powder       . polyethylene glycol powder (MIRALAX) powder Take 17 g by mouth 2 (two) times daily as needed. For constipation      . timolol (TIMOPTIC) 0.5 % ophthalmic solution Place 1 drop into the right eye daily.        No current facility-administered medications for this visit.   Family History  Problem Relation Age of Onset  . Coronary artery disease Neg Hx     premature   History   Social History  . Marital Status: Single    Spouse Name: N/A    Number of Children: 1  . Years of Education: N/A   Occupational History  .     Social History Main Topics  . Smoking status: Never Smoker   . Smokeless tobacco: None  . Alcohol Use: No  . Drug Use: No  . Sexually Active: None   Other Topics Concern  . None   Social History Narrative  Originally from [place]. She has been here in the [place] since the 60's.   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      Review of Systems: A full 12 system ROS is negative except as noted in the HPI and A&P.   Objective:  Physical Exam: Filed Vitals:   01/02/13 1459  BP: 121/74  Pulse: 63  Temp: 99.2 F (37.3 C)  TempSrc: Oral  Height: 5\' 2"  (1.575 m)  Weight: 136 lb 11.2 oz (62.007 kg)  SpO2: 98%   Constitutional: Vital signs reviewed.  Patient is a well-developed and well-nourished elderly woman in no acute distress and cooperative with exam. Alert and oriented x3.  Head: Normocephalic and atraumatic Ear: TM normal bilaterally Mouth: no erythema or exudates, MMM Eyes: PERRL, EOMI, conjunctivae normal, No scleral icterus.  Neck: Supple, Trachea midline normal ROM, No JVD, mass, thyromegaly, or carotid bruit present.  Cardiovascular: RRR, S1 normal, S2 normal, no MRG, pulses symmetric and intact bilaterally Pulmonary/Chest: CTAB, no wheezes, rales, or  rhonchi Abdominal: Soft. Non-tender, non-distended, bowel sounds are normal, no masses, organomegaly, or guarding present.  GU: no CVA tenderness Musculoskeletal: No joint deformities, erythema, or stiffness, ROM full and no nontender Hematology: no cervical, inginal, or axillary adenopathy.  Neurological: A&O x3, Strength is normal and symmetric bilaterally, cranial nerve II-XII are grossly intact, no focal motor deficit, sensory intact to light touch bilaterally. SLR is positive on the left.  Back ROM is limited by pain in all planes.   Skin: Warm, dry and intact. No rash, cyanosis, or clubbing.  Psychiatric: Normal mood and affect. speech and behavior is normal. Judgment and thought content normal. Cognition and memory are normal.   Assessment & Plan:

## 2013-01-02 NOTE — Patient Instructions (Signed)
1.  Start the Gabapentin 100 mg Capsules.  Take 1 tablet three times daily  2.  Start the Fentanyl patches 12.5 mcg.  Place one patch on every 3 days.    3.  Follow up in 4 weeks to see how you are doing.

## 2013-01-03 ENCOUNTER — Ambulatory Visit (HOSPITAL_BASED_OUTPATIENT_CLINIC_OR_DEPARTMENT_OTHER): Payer: Medicare Other | Admitting: Oncology

## 2013-01-03 ENCOUNTER — Encounter: Payer: Self-pay | Admitting: Oncology

## 2013-01-03 ENCOUNTER — Telehealth: Payer: Self-pay | Admitting: Oncology

## 2013-01-03 ENCOUNTER — Other Ambulatory Visit (HOSPITAL_BASED_OUTPATIENT_CLINIC_OR_DEPARTMENT_OTHER): Payer: Medicare Other | Admitting: Lab

## 2013-01-03 VITALS — BP 126/76 | HR 77 | Temp 98.7°F | Resp 17 | Ht 62.0 in | Wt 135.0 lb

## 2013-01-03 DIAGNOSIS — D649 Anemia, unspecified: Secondary | ICD-10-CM

## 2013-01-03 DIAGNOSIS — E611 Iron deficiency: Secondary | ICD-10-CM

## 2013-01-03 LAB — CBC WITH DIFFERENTIAL/PLATELET
Basophils Absolute: 0 10*3/uL (ref 0.0–0.1)
Eosinophils Absolute: 0.1 10*3/uL (ref 0.0–0.5)
HGB: 11.2 g/dL — ABNORMAL LOW (ref 11.6–15.9)
MCV: 97.2 fL (ref 79.5–101.0)
NEUT#: 2.9 10*3/uL (ref 1.5–6.5)
RDW: 15.1 % — ABNORMAL HIGH (ref 11.2–14.5)
lymph#: 2 10*3/uL (ref 0.9–3.3)

## 2013-01-03 NOTE — Progress Notes (Signed)
[facility] OFFICE PROGRESS NOTE  Cc:  Almyra Deforest, MD  DIAGNOSIS:  Anemia most likely secondary to anemia of chronic kidney disease vs. Component of celiac sprue vs. Ineffective erythropoeisis due to her age.  PAST THERAPY: 2 units PRBC and Feraheme 1020 mg IV on 09/28/12.  CURRENT TREATMENT:  Oral iron supplementation.   INTERVAL HISTORY: [name] 79 y.o. female returns for regular follow up with a caregiver.  She has generalized fatigue. No SOB and DOE.  She specifically denied visible source of bleeding. She has some left hip pain and was started on Fentanyl and Neurontin for this.  She denied leg weakness or numbness.   Patient denies fever, anorexia, weight loss, headache, visual changes, confusion, drenching night sweats, palpable lymph node swelling, mucositis, odynophagia, dysphagia, nausea vomiting, jaundice, chest pain, palpitation, productive cough, gum bleeding, epistaxis, hematemesis, hemoptysis, abdominal pain, abdominal swelling, early satiety, melena, hematochezia, hematuria, skin rash, spontaneous bleeding, heat or cold intolerance, bowel bladder incontinence, depression.     MEDICAL HISTORY: Past Medical History  Diagnosis Date  . Hypertension   . Head ache   . Renal insufficiency, mild   . Depression   . Fecal occult blood test positive   . Restless leg syndrome   . Anemia   . Cataracts, bilateral   . Hemorrhoids   . Rupture of rotator cuff of shoulder     s/p repair by Dr. Luiz Blare 7/07  . Lumbar spinal stenosis   . Postnasal drip   . Atrial tachycardia     echo 04/08/11: EF 55-60%, mild LVH, grade 1 diast dysfxn;    s/p RFCA with Dr. Ladona Ridgel  04/09/11  . Hyperkalemia 04/08/2011  . Unspecified deficiency anemia     SURGICAL HISTORY:  Past Surgical History  Procedure Laterality Date  . Doppler echocardiography  2004  . Back surgery      lumbar x 2   . Rotator cuff repair      right    MEDICATIONS: Current Outpatient  Prescriptions  Medication Sig Dispense Refill  . aspirin 81 MG tablet Take 81 mg by mouth daily.        . diclofenac sodium (VOLTAREN) 1 % GEL Apply 2 g topically 4 (four) times daily.      Marland Kitchen diltiazem (TIAZAC) 120 MG 24 hr capsule Take 120 mg by mouth daily.      . fentaNYL (DURAGESIC - DOSED MCG/HR) 12 MCG/HR Place 1 patch (12.5 mcg total) onto the skin every 3 (three) days.  10 patch  0  . gabapentin (NEURONTIN) 100 MG capsule Take 1 capsule (100 mg total) by mouth 3 (three) times daily.  90 capsule  1  . HYDROcodone-acetaminophen (NORCO/VICODIN) 5-325 MG per tablet Take 1 tablet by mouth every 4 (four) hours as needed for pain.  15 tablet  0  . iron polysaccharides (NIFEREX) 150 MG capsule Take 150 mg by mouth 2 (two) times daily.      Marland Kitchen LIDODERM 5 % Place 1 patch onto the skin daily. Dr Jordan Likes      . metoprolol tartrate (LOPRESSOR) 25 MG tablet Take 25 mg by mouth 2 (two) times daily.      . polyethylene glycol powder (MIRALAX) powder Take 17 g by mouth 2 (two) times daily as needed. For constipation      . timolol (TIMOPTIC) 0.5 % ophthalmic solution Place 1 drop into the right eye daily.        No current facility-administered medications for this  visit.    ALLERGIES:  is allergic to codeine; ibuprofen; and oxycodone hcl er.  REVIEW OF SYSTEMS:  The rest of the 14-point review of system was negative.   Filed Vitals:   01/03/13 1324  BP: 126/76  Pulse: 77  Temp: 98.7 F (37.1 C)  Resp: 17   Wt Readings from Last 3 Encounters:  01/03/13 135 lb (61.236 kg)  01/02/13 136 lb 11.2 oz (62.007 kg)  11/19/12 137 lb 3.2 oz (62.234 kg)   ECOG Performance status: 1  PHYSICAL EXAMINATION:  General:  Thin-appearing woman in no acute distress.  Eyes:  no scleral icterus.  ENT:  There were no oropharyngeal lesions.  Neck was without thyromegaly.  Lymphatics:  Negative cervical, supraclavicular or axillary adenopathy.  Respiratory: lungs were clear bilaterally without wheezing or crackles.   Cardiovascular:  Regular rate and rhythm, S1/S2, without murmur, rub or gallop.  There was no pedal edema.  GI:  abdomen was soft, flat, nontender, nondistended, without organomegaly.  Muscoloskeletal:  no spinal tenderness of palpation of vertebral spine.  Skin exam was without echymosis, petichae.  Neuro exam was nonfocal.  Patient needed some help to  get on and off exam table.  Gait was normal.  Patient was alerted and oriented.  Attention was good.   Language was appropriate.  Mood was normal without depression.  Speech was not pressured.  Thought content was not tangential.     LABORATORY/RADIOLOGY DATA:  Lab Results  Component Value Date   WBC 5.8 01/03/2013   HGB 11.2* 01/03/2013   HCT 33.2* 01/03/2013   PLT 152 01/03/2013   GLUCOSE 88 09/27/2012   CHOL  Value: 116        ATP III CLASSIFICATION:  <200     mg/dL   Desirable  409-811  mg/dL   Borderline High  >=914    mg/dL   High        7/82/9562   TRIG 190* 01/08/2011   HDL 43 01/08/2011   LDLCALC  Value: 35        Total Cholesterol/HDL:CHD Risk Coronary Heart Disease Risk Table                     Men   Women  1/2 Average Risk   3.4   3.3  Average Risk       5.0   4.4  2 X Average Risk   9.6   7.1  3 X Average Risk  23.4   11.0        Use the calculated Patient Ratio above and the CHD Risk Table to determine the patient's CHD Risk.        ATP III CLASSIFICATION (LDL):  <100     mg/dL   Optimal  130-865  mg/dL   Near or Above                    Optimal  130-159  mg/dL   Borderline  784-696  mg/dL   High  >295     mg/dL   Very High 2/84/1324   ALT 37 09/27/2012   AST 55* 09/27/2012   NA 140 09/27/2012   K 4.0 09/27/2012   CL 113* 09/27/2012   CREATININE 0.9 09/27/2012   BUN 18.0 09/27/2012   CO2 21* 09/27/2012   TSH 0.768 01/07/2011   HGBA1C  Value: 6.0 (NOTE)  According to the ADA Clinical Practice Recommendations for 2011, when HbA1c is used as a screening test:   >=6.5%    Diagnostic of Diabetes Mellitus           (if abnormal result  is confirmed)  5.7-6.4%   Increased risk of developing Diabetes Mellitus  References:Diagnosis and Classification of Diabetes Mellitus,Diabetes Care,2011,34(Suppl 1):S62-S69 and Standards of Medical Care in         Diabetes - 2011,Diabetes Care,2011,34  (Suppl 1):S11-S61.* 01/08/2011    ASSESSMENT AND PLAN:   1. History of chronic anemia with negative extensive work up with lab test and GI eval except for possible celiac sprue.  Hgb today is stable. -Recommendation:    *  Continue oral iron:  Either NuIron 150mg  by mouth twice daily or SlowFe 325mg  by mouth twice daily.  * CBC every 2 months.  2. Hypertension.  She is on diltiazem and metoprolol per PCP.   3. History of renal insufficiency from presumed chronic hypertension.  Cr today is stable. She will continue to follow with PCP. 4. Follow up:  [facility] every 2 months to see if she needs pRBC transfusion.  Return visit in about 4 months.     The length of time of the face-to-face encounter was 15 minutes. More than 50% of time was spent counseling and coordination of care.

## 2013-01-03 NOTE — Patient Instructions (Addendum)
Iron-Rich Diet  An iron-rich diet contains foods that are good sources of iron. Iron is an important mineral that helps your body produce hemoglobin. Hemoglobin is a protein in red blood cells that carries oxygen to the body's tissues. Sometimes, the iron level in your blood can be low. This may be caused by:  · A lack of iron in your diet.  · Blood loss.  · Times of growth, such as during pregnancy or during a child's growth and development.  Low levels of iron can cause a decrease in the number of red blood cells. This can result in iron deficiency anemia. Iron deficiency anemia symptoms include:  · Tiredness.  · Weakness.  · Irritability.  · Increased chance of infection.  Here are some recommendations for daily iron intake:  · Males older than 77 years of age need 8 mg of iron per day.  · Women ages 19 to 50 need 18 mg of iron per day.  · Pregnant women need 27 mg of iron per day, and women who are over 19 years of age and breastfeeding need 9 mg of iron per day.  · Women over the age of 50 need 8 mg of iron per day.  SOURCES OF IRON  There are 2 types of iron that are found in food: heme iron and nonheme iron. Heme iron is absorbed by the body better than nonheme iron. Heme iron is found in meat, poultry, and fish. Nonheme iron is found in grains, beans, and vegetables.  Heme Iron Sources  Food / Iron (mg)  · Chicken liver, 3 oz (85 g)/ 10 mg  · Beef liver, 3 oz (85 g)/ 5.5 mg  · Oysters, 3 oz (85 g)/ 8 mg  · Beef, 3 oz (85 g)/ 2 to 3 mg  · Shrimp, 3 oz (85 g)/ 2.8 mg  · Turkey, 3 oz (85 g)/ 2 mg  · Chicken, 3 oz (85 g) / 1 mg  · Fish (tuna, halibut), 3 oz (85 g)/ 1 mg  · Pork, 3 oz (85 g)/ 0.9 mg  Nonheme Iron Sources  Food / Iron (mg)  · Ready-to-eat breakfast cereal, iron-fortified / 3.9 to 7 mg  · Tofu, ½ cup / 3.4 mg  · Kidney beans, ½ cup / 2.6 mg  · Baked potato with skin / 2.7 mg  · Asparagus, ½ cup / 2.2 mg  · Avocado / 2 mg  · Dried peaches, ½ cup / 1.6 mg  · Raisins, ½ cup / 1.5 mg  · Soy milk, 1 cup  / 1.5 mg  · Whole-wheat bread, 1 slice / 1.2 mg  · Spinach, 1 cup / 0.8 mg  · Broccoli, ½ cup / 0.6 mg  IRON ABSORPTION  Certain foods can decrease the body's absorption of iron. Try to avoid these foods and beverages while eating meals with iron-containing foods:  · Coffee.  · Tea.  · Fiber.  · Soy.  Foods containing vitamin C can help increase the amount of iron your body absorbs from iron sources, especially from nonheme sources. Eat foods with vitamin C along with iron-containing foods to increase your iron absorption. Foods that are high in vitamin C include many fruits and vegetables. Some good sources are:  · Fresh orange juice.  · Oranges.  · Strawberries.  · Mangoes.  · Grapefruit.  · Red bell peppers.  · Green bell peppers.  · Broccoli.  · Potatoes with skin.  · Tomato juice.  Document 

## 2013-01-03 NOTE — Telephone Encounter (Signed)
, °

## 2013-01-10 ENCOUNTER — Other Ambulatory Visit (HOSPITAL_COMMUNITY): Payer: Self-pay | Admitting: Physical Medicine and Rehabilitation

## 2013-01-10 DIAGNOSIS — M533 Sacrococcygeal disorders, not elsewhere classified: Secondary | ICD-10-CM

## 2013-01-10 DIAGNOSIS — M545 Low back pain: Secondary | ICD-10-CM

## 2013-01-11 ENCOUNTER — Other Ambulatory Visit: Payer: Self-pay | Admitting: *Deleted

## 2013-01-14 MED ORDER — DICLOFENAC SODIUM 1 % TD GEL: 2.0000 g | Freq: Four times a day (QID) | TRANSDERMAL | Status: DC

## 2013-01-16 ENCOUNTER — Telehealth: Payer: Self-pay | Admitting: Oncology

## 2013-01-16 NOTE — Telephone Encounter (Signed)
s.w. the pt and advised on changed d/t to 4.17.14 due to Dr. Gaylyn Rong out of the office...pt ok and aware

## 2013-01-18 ENCOUNTER — Other Ambulatory Visit: Payer: Self-pay | Admitting: Internal Medicine

## 2013-01-18 ENCOUNTER — Encounter (HOSPITAL_COMMUNITY): Payer: Medicare Other

## 2013-01-18 ENCOUNTER — Encounter (HOSPITAL_COMMUNITY)
Admission: RE | Admit: 2013-01-18 | Discharge: 2013-01-18 | Disposition: A | Discharge status: Home / Self Care | Payer: Medicare Other | Source: Ambulatory Visit | Attending: Physical Medicine and Rehabilitation | Admitting: Physical Medicine and Rehabilitation

## 2013-01-18 DIAGNOSIS — Z1231 Encounter for screening mammogram for malignant neoplasm of breast: Secondary | ICD-10-CM

## 2013-01-18 DIAGNOSIS — M545 Low back pain, unspecified: Secondary | ICD-10-CM | POA: Insufficient documentation

## 2013-01-18 DIAGNOSIS — M533 Sacrococcygeal disorders, not elsewhere classified: Secondary | ICD-10-CM | POA: Insufficient documentation

## 2013-01-18 MED ORDER — TECHNETIUM TC 99M MEDRONATE IV KIT
25.0000 | PACK | Freq: Once | INTRAVENOUS | Status: AC | PRN
  Administered 2013-01-18: 27.5 via INTRAVENOUS

## 2013-02-04 ENCOUNTER — Other Ambulatory Visit: Payer: Self-pay | Admitting: *Deleted

## 2013-02-04 MED ORDER — METOPROLOL TARTRATE 25 MG PO TABS: 25.0000 mg | ORAL_TABLET | Freq: Two times a day (BID) | ORAL | Status: DC

## 2013-02-07 ENCOUNTER — Ambulatory Visit (INDEPENDENT_AMBULATORY_CARE_PROVIDER_SITE_OTHER): Payer: Medicare Other | Admitting: Internal Medicine

## 2013-02-07 ENCOUNTER — Encounter: Payer: Self-pay | Admitting: Internal Medicine

## 2013-02-07 VITALS — BP 112/70 | HR 66 | Temp 98.2°F | Wt 135.6 lb

## 2013-02-07 DIAGNOSIS — M48061 Spinal stenosis, lumbar region without neurogenic claudication: Secondary | ICD-10-CM

## 2013-02-07 DIAGNOSIS — R7402 Elevation of levels of lactic acid dehydrogenase (LDH): Secondary | ICD-10-CM

## 2013-02-07 DIAGNOSIS — R600 Localized edema: Secondary | ICD-10-CM | POA: Insufficient documentation

## 2013-02-07 DIAGNOSIS — I10 Essential (primary) hypertension: Secondary | ICD-10-CM

## 2013-02-07 DIAGNOSIS — W19XXXA Unspecified fall, initial encounter: Secondary | ICD-10-CM

## 2013-02-07 DIAGNOSIS — M7989 Other specified soft tissue disorders: Secondary | ICD-10-CM

## 2013-02-07 DIAGNOSIS — M75120 Complete rotator cuff tear or rupture of unspecified shoulder, not specified as traumatic: Secondary | ICD-10-CM

## 2013-02-07 LAB — BASIC METABOLIC PANEL WITH GFR
BUN: 14 mg/dL (ref 6–23)
CO2: 28 mEq/L (ref 19–32)
Chloride: 107 mEq/L (ref 96–112)
Glucose, Bld: 66 mg/dL — ABNORMAL LOW (ref 70–99)
Potassium: 4.6 mEq/L (ref 3.5–5.3)

## 2013-02-07 MED ORDER — GABAPENTIN 100 MG PO CAPS: 100.0000 mg | ORAL_CAPSULE | Freq: Every day | ORAL | Status: DC

## 2013-02-07 MED ORDER — FENTANYL 12 MCG/HR TD PT72: 1.0000 | MEDICATED_PATCH | TRANSDERMAL | Status: DC

## 2013-02-07 NOTE — Assessment & Plan Note (Signed)
Considering patient's blood pressure of 112/70 and her age and high risk of falls will discontinue diltiazem at this point but I will continue metoprolol 25 mg twice a day. Reevaluate patient during the next office visit.

## 2013-02-07 NOTE — Patient Instructions (Addendum)
1. Please stop taking Diltiazem 2. Ask at a store for compression stocking

## 2013-02-07 NOTE — Assessment & Plan Note (Signed)
The patient was prescribed gabapentin 100 mg 3 times a day but considering patient's drowsiness and sleepiness I will decrease it 200 mg daily at bedtime only and continue fentanyl every 72 hours for pain control.

## 2013-02-07 NOTE — Progress Notes (Signed)
Subjective:   Patient ID: [name] female   DOB: 26-Jun-1925 79 y.o.   MRN: [id number]  HPI: [name] is a 79 y.o. female with past medical history significant as outlined below who presented to the clinic with multiple complains.  1. Bilateral edema in her feet since 4-6 weeks.But recently the swelling in her feet are getting worse.She elevates her leg as much as possible. Feels very weak and pain.  2. Rash on face and neck: Since March. Unclear about any new medication. No changes in diet . Scaly skin of face and neck : no itching or pain full. She had one episode were it was red on her neck ( blisters ) which were non-tender and disappeared without any intervention.  3. Fall: Patient fell over matts in the bathroom . Patient denies any chest pain, shortness of breath, dizziness at the time of fall. Patient caretaker has taken away all the rugs in the house. Also has her shower chair 4. Back pain: Fentanyl patch and Gabapentin is working    With discussion with patient and caretaker along with my relationship with this patient for a long time and noticed that patient has been gradually declining in her functional status. Patient has significant comorbidities which restrict her to take care of herself on a daily basis. Mobility has decreased significantly. She currently has an aide who comes in after 12:00 for 3 hours. I believe patient needs more care at this point. She gets food stamps and food on wheels. Patient needs to be evaluated if more care can be provided. Patient has no family support at all. She noted that there are some few family member who are not involved in her care.   Past Medical History  Diagnosis Date  . Hypertension   . Head ache   . Renal insufficiency, mild   . Depression   . Fecal occult blood test positive   . Restless leg syndrome   . Anemia   . Cataracts, bilateral   . Hemorrhoids   . Rupture of rotator cuff of shoulder     s/p repair by Dr. Luiz Blare  7/07  . Lumbar spinal stenosis   . Postnasal drip   . Atrial tachycardia     echo 04/08/11: EF 55-60%, mild LVH, grade 1 diast dysfxn;    s/p RFCA with Dr. Ladona Ridgel  04/09/11  . Hyperkalemia 04/08/2011  . Unspecified deficiency anemia    Current Outpatient Prescriptions  Medication Sig Dispense Refill  . aspirin 81 MG tablet Take 81 mg by mouth daily.        . diclofenac sodium (VOLTAREN) 1 % GEL Apply 2 g topically 4 (four) times daily.  100 g  2  . diltiazem (TIAZAC) 120 MG 24 hr capsule Take 120 mg by mouth daily.      . fentaNYL (DURAGESIC - DOSED MCG/HR) 12 MCG/HR Place 1 patch (12.5 mcg total) onto the skin every 3 (three) days.  10 patch  0  . gabapentin (NEURONTIN) 100 MG capsule Take 1 capsule (100 mg total) by mouth 3 (three) times daily.  90 capsule  1  . iron polysaccharides (NIFEREX) 150 MG capsule Take 150 mg by mouth 2 (two) times daily.      Marland Kitchen LIDODERM 5 % Place 1 patch onto the skin daily. Dr Jordan Likes      . metoprolol tartrate (LOPRESSOR) 25 MG tablet Take 1 tablet (25 mg total) by mouth 2 (two) times daily.  60 tablet  3  .  polyethylene glycol powder (MIRALAX) powder Take 17 g by mouth 2 (two) times daily as needed. For constipation      . timolol (TIMOPTIC) 0.5 % ophthalmic solution Place 1 drop into the right eye daily.        No current facility-administered medications for this visit.   Family History  Problem Relation Age of Onset  . Coronary artery disease Neg Hx     premature   History   Social History  . Marital Status: Single    Spouse Name: N/A    Number of Children: 1  . Years of Education: N/A   Occupational History  .     Social History Main Topics  . Smoking status: Never Smoker   . Smokeless tobacco: None  . Alcohol Use: No  . Drug Use: No  . Sexually Active: None   Other Topics Concern  . None   Social History Narrative   Originally from [place]. She has been here in the [place] since the 60's.   She used to be a hair stylist   As  of 12/2012 she lives alone but has a home helper who comes in 3 hours M-F and 2 hours on Sat, Sunday.      Review of Systems: Constitutional: Denies fever, chills, diaphoresis but appetite change and fatigue.  HEENT: Denies  ear pain, sore throat,mouth sores, trouble swallowing,   Respiratory: Denies SOB, DOE, cough, chest tightness,  and wheezing.   Cardiovascular: Denies chest pain, palpitations but noted signficant leg swelling.  Gastrointestinal: Denies nausea, vomiting, abdominal pain, diarrhea, constipation, blood in stool and abdominal distention.  Genitourinary: Denies dysuria, urgency, frequency, hematuria, flank pain and difficulty urinating.  Musculoskeletal:Back pain and gait instability  Neurological: Denies dizziness,   Objective:  Physical Exam: Filed Vitals:   02/07/13 1336  BP: 112/70  Pulse: 66  Temp: 98.2 F (36.8 C)  TempSrc: Oral  Weight: 135 lb 9.6 oz (61.508 kg)  SpO2: 98%   Constitutional: Vital signs reviewed.  Patient is a well-developed and well-nourished female in no acute distress and cooperative with exam. Alert and oriented x3.  Ear: TM normal bilaterally Mouth: no erythema or exudates, MMM Eyes: PERRL, EOMI, conjunctivae normal Neck: Supple,  Cardiovascular: RRR, S1 normal, S2 normal, no MRG, pulses decreased left more then right. 2-3 pitting edema bilaterally.   Pulmonary/Chest: CTAB, no wheezes, rales, or rhonchi Abdominal: Soft. Non-tender, non-distended, bowel sounds are normal,  Hematology: no cervical adenopathy.  Neurological: A&O x3,  Skin: Warm, dry and intact. Scaly patchy area present on the shin, forehead and neck area. No rashes, no erythema no comes noted. No tenderness on palpation. Lower extremities are notable for venous stasis dermatitis

## 2013-02-07 NOTE — Assessment & Plan Note (Signed)
At this point seems to be it was a mechanical fall. Discussed with the caregiver that patient needs to have a safe area to move around. Patient is high risk for falls. The patient if he requires more care and possibly wheelchair to decrease fall risk as much as possible.

## 2013-02-07 NOTE — Assessment & Plan Note (Addendum)
Most likely due to venous stasis.  Other differential diagnosis include peripheral vascular disease, CHF or renal insufficiency. I will obtain ABI and basic metabolic panel at this point.   Recommended elevation of legs as much as possible

## 2013-02-08 ENCOUNTER — Other Ambulatory Visit: Payer: Self-pay | Admitting: Internal Medicine

## 2013-02-08 LAB — HEPATIC FUNCTION PANEL
ALT: 30 U/L (ref 0–35)
Alkaline Phosphatase: 106 U/L (ref 39–117)
Indirect Bilirubin: 0.4 mg/dL (ref 0.0–0.9)
Total Protein: 6.5 g/dL (ref 6.0–8.3)

## 2013-02-08 NOTE — Addendum Note (Signed)
Addended by: Bufford Spikes on: 02/08/2013 09:50 AM   Modules accepted: Orders

## 2013-02-11 ENCOUNTER — Other Ambulatory Visit: Payer: Self-pay | Admitting: Internal Medicine

## 2013-02-11 DIAGNOSIS — R600 Localized edema: Secondary | ICD-10-CM

## 2013-02-11 MED ORDER — FUROSEMIDE 20 MG PO TABS: 20.0000 mg | ORAL_TABLET | Freq: Every day | ORAL | Status: DC

## 2013-02-12 ENCOUNTER — Ambulatory Visit (HOSPITAL_COMMUNITY)
Admission: RE | Admit: 2013-02-12 | Discharge: 2013-02-12 | Disposition: A | Discharge status: Home / Self Care | Payer: Medicare Other | Source: Ambulatory Visit | Attending: Internal Medicine | Admitting: Internal Medicine

## 2013-02-12 ENCOUNTER — Telehealth: Payer: Self-pay | Admitting: Licensed Clinical Social Worker

## 2013-02-12 DIAGNOSIS — R0989 Other specified symptoms and signs involving the circulatory and respiratory systems: Secondary | ICD-10-CM

## 2013-02-12 DIAGNOSIS — M79609 Pain in unspecified limb: Secondary | ICD-10-CM | POA: Insufficient documentation

## 2013-02-12 DIAGNOSIS — M7989 Other specified soft tissue disorders: Secondary | ICD-10-CM | POA: Insufficient documentation

## 2013-02-12 NOTE — Telephone Encounter (Signed)
[name] was referred to CSW for request for additional PCS hours.  CSW placed call to Ms. Winward, confirmed and pt does not know name of current agency.  PCS request for add'l hours initiated.

## 2013-02-12 NOTE — Progress Notes (Signed)
VASCULAR LAB PRELIMINARY  ARTERIAL  ABI completed: ABIs may be falsely elevated due to calcified vessels. Based on waveforms, there appears to be adequate perfusion.    RIGHT    LEFT    PRESSURE WAVEFORM  PRESSURE WAVEFORM  BRACHIAL 165 Tri BRACHIAL 174 Tri  DP   DP    AT 240 Tri AT 185 Bi  PT 179 Bi PT 160 Bi  PER   PER    GREAT TOE  NA GREAT TOE  NA    RIGHT LEFT  ABI 1.38 1.05     Farrel Demark, RDMS, RVT  02/12/2013, 3:05 PM

## 2013-02-13 NOTE — Progress Notes (Signed)
Case discussed with Dr. Illath soon after being seen by the resident. We reviewed the resident's history and exam and pertinent patient test results. I agree with the assessment, diagnosis and plan of care documented in the resident's note. 

## 2013-02-18 ENCOUNTER — Encounter: Payer: Medicare Other | Admitting: Internal Medicine

## 2013-02-18 ENCOUNTER — Ambulatory Visit (INDEPENDENT_AMBULATORY_CARE_PROVIDER_SITE_OTHER): Payer: Medicare Other | Admitting: Internal Medicine

## 2013-02-18 ENCOUNTER — Encounter: Payer: Self-pay | Admitting: Internal Medicine

## 2013-02-18 VITALS — BP 120/80 | HR 70 | Temp 97.2°F | Wt 130.1 lb

## 2013-02-18 DIAGNOSIS — Z7409 Other reduced mobility: Secondary | ICD-10-CM

## 2013-02-18 DIAGNOSIS — R609 Edema, unspecified: Secondary | ICD-10-CM

## 2013-02-18 DIAGNOSIS — R600 Localized edema: Secondary | ICD-10-CM

## 2013-02-18 DIAGNOSIS — R69 Illness, unspecified: Secondary | ICD-10-CM

## 2013-02-18 DIAGNOSIS — I10 Essential (primary) hypertension: Secondary | ICD-10-CM

## 2013-02-18 DIAGNOSIS — D649 Anemia, unspecified: Secondary | ICD-10-CM

## 2013-02-18 LAB — CBC
HCT: 42.4 % (ref 36.0–46.0)
MCH: 31.9 pg (ref 26.0–34.0)
MCHC: 34 g/dL (ref 30.0–36.0)
RDW: 13.6 % (ref 11.5–15.5)

## 2013-02-18 LAB — BASIC METABOLIC PANEL WITH GFR
BUN: 18 mg/dL (ref 6–23)
Creat: 1.23 mg/dL — ABNORMAL HIGH (ref 0.50–1.10)
GFR, Est African American: 46 mL/min — ABNORMAL LOW
GFR, Est Non African American: 40 mL/min — ABNORMAL LOW

## 2013-02-18 NOTE — Progress Notes (Signed)
[name] has significant gait instability and pain which has required a walker, but is no longer able to safely use a cane or walker as well as a manual wheelchair.  She is high risk for falls.  She can safely operate a power wheelchair in the home.  She is motivated to use a power wheelchair.  She would benefit from a motorized wheelchair.

## 2013-02-18 NOTE — Assessment & Plan Note (Signed)
Lower extremity swelling resolved. I would discontinue Lasix at this point since it looks dry on physical exam.  I again underlined the importance about elevating her legsas much as possible. I will prescribe compression stockings . Prescription was handed to the patient.

## 2013-02-18 NOTE — Progress Notes (Addendum)
Subjective:   Patient ID: [name] female   DOB: [birth year] 79 y.o.   MRN: [id number]  HPI: [name] is a 79 y.o. female with past medical history significant for hypertension, chronic kidney disease, history of rotator cuff tear, lumbar spinal stenosis who presented to the clinic for probable wheelchair evaluation.  1. Impaired mobility: Patient continues to have  worsening pain in her back and shoulders bilaterally therefore she is hardly able to get around the house. She further noted that she does not feel stable enough to use the walker . She is not able to move from room to room safely without high risk of falls.   2. Shoulder pain: has been getting worse. She noted the pain in the left shoulder is 10/10 and the right shoulder is 8/10   3. Leg swelling : has improved significantly with elevation and Lasix 20 mg daily   I discussed with the patient about CODE STATUS and patient would like to discuss with her family members.    Past Medical History  Diagnosis Date  . Hypertension   . Head ache   . Renal insufficiency, mild   . Depression   . Fecal occult blood test positive   . Restless leg syndrome   . Anemia   . Cataracts, bilateral   . Hemorrhoids   . Rupture of rotator cuff of shoulder     s/p repair by Dr. Luiz Blare 7/07  . Lumbar spinal stenosis   . Postnasal drip   . Atrial tachycardia     echo 04/08/11: EF 55-60%, mild LVH, grade 1 diast dysfxn;    s/p RFCA with Dr. Ladona Ridgel  04/09/11  . Hyperkalemia 04/08/2011  . Unspecified deficiency anemia    Current Outpatient Prescriptions  Medication Sig Dispense Refill  . metoprolol tartrate (LOPRESSOR) 25 MG tablet Take 1 tablet (25 mg total) by mouth 2 (two) times daily.  60 tablet  3  . aspirin 81 MG tablet Take 81 mg by mouth daily.        . clindamycin (CLEOCIN) 150 MG capsule       . diclofenac sodium (VOLTAREN) 1 % GEL Apply 2 g topically 4 (four) times daily.  100 g  2  . fentaNYL (DURAGESIC - DOSED MCG/HR) 12  MCG/HR Place 1 patch (12.5 mcg total) onto the skin every 3 (three) days.  10 patch  0  . furosemide (LASIX) 20 MG tablet Take 1 tablet (20 mg total) by mouth daily. In the morning  30 tablet  0  . gabapentin (NEURONTIN) 100 MG capsule Take 1 capsule (100 mg total) by mouth at bedtime.  30 capsule  1  . iron polysaccharides (NIFEREX) 150 MG capsule Take 150 mg by mouth 2 (two) times daily.      . polyethylene glycol powder (MIRALAX) powder Take 17 g by mouth 2 (two) times daily as needed. For constipation      . timolol (TIMOPTIC) 0.5 % ophthalmic solution Place 1 drop into the right eye daily.        No current facility-administered medications for this visit.   Family History  Problem Relation Age of Onset  . Coronary artery disease Neg Hx     premature   History   Social History  . Marital Status: Single    Spouse Name: N/A    Number of Children: 1  . Years of Education: N/A   Occupational History  .     Social History Main Topics  .  Smoking status: Never Smoker   . Smokeless tobacco: None  . Alcohol Use: No  . Drug Use: No  . Sexually Active: None   Other Topics Concern  . None   Social History Narrative   Originally from [place]. She has been here in the [place] since the 60's.   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      Review of Systems: Constitutional: Denies fever, chills, diaphoresis, appetite change but note  fatigue.  Respiratory: Denies SOB, DOE, cough, chest tightness,  and wheezing.   Cardiovascular: Denies chest pain, palpitations and leg swelling.  Gastrointestinal: Denies nausea, vomiting, abdominal pain, diarrhea, constipation, blood in stool and abdominal distention.  Genitourinary: Denies dysuria, urgency, frequency, hematuria, flank pain and difficulty urinating.  Musculoskeletal: Noted myalgias, back pain, joint swelling, arthralgias and gait  problem.  Skin: Denies pallor, rash and wound.  Neurological: Denies dizziness, seizures, syncope but noted weakness and numbness   Objective:  Physical Exam: Filed Vitals:   02/18/13 1349  BP: 120/80  Pulse: 70  Temp: 97.2 F (36.2 C)  TempSrc: Oral  Weight: 130 lb 1.6 oz (59.013 kg)  SpO2: 97%    Constitutional: Vital signs reviewed. Patient is a well-developed and well-nourished woman in no acute distress and cooperative with exam. Alert and oriented x3.  Head: Normocephalic and atraumatic  Mouth: no erythema or exudates, MMM  Eyes: PERRL, EOMI, conjunctivae normal,  Neck: Supple,  Cardiovascular: RRR, S1 normal, S2 normal, no MRG, pulses symmetric and intact bilaterally  Pulmonary/Chest: CTAB, no wheezes, rales, or rhonchi  Abdominal: Soft. Non-tender, non-distended, bowel sounds are normal,  Musculoskeletal: Strength: RUE 3/5. LUE 2/5, RLE 3/5 , LLE 3/5  ROM: Decrease in Left >Right shoulder due to pain.  Significant pain with palpation of lower back area  Neurological: A&O x3, cranial nerve II-XII are grossly intact, sensory intact to light touch bilaterally. Unsteady gait. Negative Romberg.  Psychiatric: Normal mood and affect. speech and behavior is normal.   PAYA, ALEJANDRO

## 2013-02-18 NOTE — Patient Instructions (Signed)
Stop taken Furosemide

## 2013-02-18 NOTE — Assessment & Plan Note (Addendum)
Due to significant gait instability and significant back and shoulder pain patient has required a walker. At this point she is no longer able to safely operate a cane or walker as well as a manuel wheelchair due to high risk of falls considering her worsening gait instability, right upper extremity strength of 3/4 and left upper extremity strength of 2/5 and pain of 8-10/10 in severity . She no longer can move from room to room safely to make her food or do toileting.  The patient cannot safely transfer into and out of a scooter (POV) because of her significant limitations in muscle strength with UE strength of 2-3/5 and pain in th shoulder between 8-10/10. The patient is alert and cooperative and feels that she could safely operate a power wheelchair in the home. The patient is willing and motivated to use the power wheelchair. Patient is high risk for falls.   For the above-mentioned reasons, I agree that [name] would benefit from motorized wheelchair. she has limitation in multiple mobility related activities of daily living and therefore, in my opinion would be a good candidate for motorized wheelchair.  [name]

## 2013-02-19 ENCOUNTER — Other Ambulatory Visit: Payer: Self-pay | Admitting: Internal Medicine

## 2013-02-19 ENCOUNTER — Ambulatory Visit (HOSPITAL_COMMUNITY)
Admission: RE | Admit: 2013-02-19 | Discharge: 2013-02-19 | Disposition: A | Discharge status: Home / Self Care | Payer: Medicare Other | Source: Ambulatory Visit | Attending: Internal Medicine | Admitting: Internal Medicine

## 2013-02-19 ENCOUNTER — Ambulatory Visit (HOSPITAL_COMMUNITY): Payer: Medicare Other

## 2013-02-19 DIAGNOSIS — Z1231 Encounter for screening mammogram for malignant neoplasm of breast: Secondary | ICD-10-CM | POA: Insufficient documentation

## 2013-02-19 DIAGNOSIS — I10 Essential (primary) hypertension: Secondary | ICD-10-CM

## 2013-02-25 NOTE — Progress Notes (Signed)
Patient ID: [name], female   DOB: Aug 16, 1925, 79 y.o.   MRN: [id number] BP:  120/80   Pulse:  70   Temp:  97.2 F (36.2 C)   TempSrc:  Oral   Weight:  130 lb 1.6 oz (59.013 kg)   SpO2:  97%    Constitutional: Vital signs reviewed. Patient is a well-developed and well-nourished woman in no acute distress and cooperative with exam. Alert and oriented x3.  Head: Normocephalic and atraumatic  Mouth: no erythema or exudates, MMM  Eyes: PERRL, EOMI, conjunctivae normal,  Neck: Supple, no adenopathy. Cardiovascular: RRR, S1 normal, S2 normal, no MRG, pulses symmetric and intact bilaterally  Pulmonary/Chest: CTAB, no wheezes, rales, or rhonchi  Abdominal: Soft. Non-tender, non-distended, bowel sounds are normal,  Musculoskeletal: Strength: RUE 3/5. LUE 2/5, RLE 3/5 , LLE 3/5  ROM: Decrease in Left >Right shoulder due to pain.  Significant pain with palpation of lower back area  Neurological: A&O x3, cranial nerve II-XII are grossly intact, sensory intact to light touch bilaterally. Unsteady gait. Negative Romberg.  Psychiatric: Normal mood and affect. speech and behavior is normal.  Patient seen and examined with above findings noted on exam during office visit.  Jonah Blue

## 2013-02-26 ENCOUNTER — Other Ambulatory Visit (INDEPENDENT_AMBULATORY_CARE_PROVIDER_SITE_OTHER): Payer: Medicare Other

## 2013-02-26 DIAGNOSIS — I10 Essential (primary) hypertension: Secondary | ICD-10-CM

## 2013-02-26 LAB — BASIC METABOLIC PANEL WITH GFR
BUN: 11 mg/dL (ref 6–23)
Calcium: 8.7 mg/dL (ref 8.4–10.5)
GFR, Est African American: 79 mL/min
GFR, Est Non African American: 68 mL/min
Potassium: 4 mEq/L (ref 3.5–5.3)
Sodium: 146 mEq/L — ABNORMAL HIGH (ref 135–145)

## 2013-02-27 ENCOUNTER — Other Ambulatory Visit: Payer: Medicare Other

## 2013-03-05 ENCOUNTER — Other Ambulatory Visit (HOSPITAL_BASED_OUTPATIENT_CLINIC_OR_DEPARTMENT_OTHER): Payer: Medicare Other | Admitting: Lab

## 2013-03-05 ENCOUNTER — Telehealth: Payer: Self-pay | Admitting: *Deleted

## 2013-03-05 DIAGNOSIS — D649 Anemia, unspecified: Secondary | ICD-10-CM

## 2013-03-05 LAB — FERRITIN: Ferritin: 182 ng/mL (ref 10–291)

## 2013-03-05 LAB — CBC WITH DIFFERENTIAL/PLATELET
BASO%: 0.5 % (ref 0.0–2.0)
Basophils Absolute: 0.1 10*3/uL (ref 0.0–0.1)
EOS%: 0.7 % (ref 0.0–7.0)
HGB: 13.4 g/dL (ref 11.6–15.9)
MCH: 31.7 pg (ref 25.1–34.0)
MONO%: 9.9 % (ref 0.0–14.0)
RBC: 4.22 10*6/uL (ref 3.70–5.45)
RDW: 12.9 % (ref 11.2–14.5)
lymph#: 3.1 10*3/uL (ref 0.9–3.3)

## 2013-03-05 NOTE — Telephone Encounter (Signed)
Informed pt of Hgb normal and not anemic at this time.  Informed of next visit on 7/17 in the afternoon.  Pt states she thinks she needs morning appt due to her caregiver,  Pasty Spillers, is only available from 9 am to 12 noon.   She asks we call her caregiver to arrange time of appt and change to morning if possible.   Saranetti ph# [phone].  POF sent to scheduler to chang appt time to morning and to call caregiver w/ date/time.

## 2013-03-05 NOTE — Telephone Encounter (Signed)
Message copied by Wende Mott on Tue Mar 05, 2013  4:01 PM ------      Message from: Myrtis Ser      Created: Tue Mar 05, 2013  2:18 PM       Please call pt. Hgb is normal. No blood transfusion is indicated. ------

## 2013-03-05 NOTE — Telephone Encounter (Signed)
sw the caregiver gave appt d/t for 04/25/13 w/ labs @ 10:15am and ov @ 10:45am.made pt aware that i would mail a letter/cal as well...td

## 2013-03-14 ENCOUNTER — Encounter: Payer: Self-pay | Admitting: Internal Medicine

## 2013-03-25 ENCOUNTER — Ambulatory Visit (INDEPENDENT_AMBULATORY_CARE_PROVIDER_SITE_OTHER): Payer: Medicare Other | Admitting: Internal Medicine

## 2013-03-25 ENCOUNTER — Encounter: Payer: Medicare Other | Admitting: Internal Medicine

## 2013-03-25 ENCOUNTER — Encounter: Payer: Self-pay | Admitting: Internal Medicine

## 2013-03-25 VITALS — BP 123/77 | HR 68 | Temp 98.0°F | Ht 60.75 in | Wt 138.2 lb

## 2013-03-25 DIAGNOSIS — I519 Heart disease, unspecified: Secondary | ICD-10-CM

## 2013-03-25 DIAGNOSIS — I5189 Other ill-defined heart diseases: Secondary | ICD-10-CM

## 2013-03-25 DIAGNOSIS — R0602 Shortness of breath: Secondary | ICD-10-CM

## 2013-03-25 DIAGNOSIS — R600 Localized edema: Secondary | ICD-10-CM

## 2013-03-25 DIAGNOSIS — R609 Edema, unspecified: Secondary | ICD-10-CM

## 2013-03-25 DIAGNOSIS — M25519 Pain in unspecified shoulder: Secondary | ICD-10-CM

## 2013-03-25 DIAGNOSIS — M25512 Pain in left shoulder: Secondary | ICD-10-CM

## 2013-03-25 LAB — BASIC METABOLIC PANEL WITH GFR
Calcium: 8.4 mg/dL (ref 8.4–10.5)
GFR, Est African American: 63 mL/min
GFR, Est Non African American: 54 mL/min — ABNORMAL LOW
Potassium: 3.5 mEq/L (ref 3.5–5.3)
Sodium: 144 mEq/L (ref 135–145)

## 2013-03-25 LAB — TSH: TSH: 0.76 u[IU]/mL (ref 0.350–4.500)

## 2013-03-25 LAB — PRO B NATRIURETIC PEPTIDE: Pro B Natriuretic peptide (BNP): 926.2 pg/mL — ABNORMAL HIGH (ref <451)

## 2013-03-25 NOTE — Patient Instructions (Addendum)
1. Will send a letter to your surgeon Dr. Ave Filter for the plan of shoulder surgery. 2. Follow up in 1-2 months with Dr. Shirlee Latch.

## 2013-03-25 NOTE — Progress Notes (Signed)
Letter and office notes faxed to Dr. Steve Rattler per Dr Dierdre Searles. Tammy Kidney Advik Weatherspoon RN 03/25/13 2:45PM

## 2013-03-25 NOTE — Progress Notes (Signed)
I discussed patient with resident Dr. Li, and I agree with the plans as outlined in her note. 

## 2013-03-25 NOTE — Progress Notes (Signed)
Subjective:   Patient ID: [name] female   DOB: [birth year] 79 y.o.   MRN: [id number]  HPI: [name] is a 79 y.o.woman with PMH of HTN, S/p radiofrequency catheter ablation by Dr. Ladona Ridgel in 2012, last echo 04/08/11 with EF 55-60% mild LVH and grade 1 diastolic dysfxn, Depression, Anemia followed by hematology, who presents to the clinic for multiple complaints.  Patient is poor historian and she is accompanied with her personal aid who provides history as well.  Please see my assessment and plan for the following problems.   # B/L LE edema  # Possible shoulder surgery  # [facility] home health agent has a long discussion of power W/C with patient today.     Past Medical History  Diagnosis Date  . Hypertension   . Head ache   . Renal insufficiency, mild   . Depression   . Fecal occult blood test positive   . Restless leg syndrome   . Anemia   . Cataracts, bilateral   . Hemorrhoids   . Rupture of rotator cuff of shoulder     s/p repair by Dr. Luiz Blare 7/07. Follows with Dr. Ave Filter for possible shoulder surgery.   . Lumbar spinal stenosis     gets Spinal injections by Dr. Callie Fielding   . Postnasal drip   . Atrial tachycardia     echo 04/08/11: EF 55-60%, mild LVH, grade 1 diast dysfxn;    s/p RFCA with Dr. Ladona Ridgel  04/09/11  . Hyperkalemia 04/08/2011  . Unspecified deficiency anemia    Current Outpatient Prescriptions  Medication Sig Dispense Refill  . aspirin 81 MG tablet Take 81 mg by mouth daily.        . diclofenac sodium (VOLTAREN) 1 % GEL Apply 2 g topically 4 (four) times daily.  100 g  2  . fentaNYL (DURAGESIC - DOSED MCG/HR) 12 MCG/HR Place 1 patch (12.5 mcg total) onto the skin every 3 (three) days.  10 patch  0  . furosemide (LASIX) 20 MG tablet Take 20 mg by mouth 2 (two) times daily.      Marland Kitchen gabapentin (NEURONTIN) 100 MG capsule Take 1 capsule (100 mg total) by mouth at bedtime.  30 capsule  1  . iron polysaccharides (NIFEREX) 150 MG capsule Take 150 mg by  mouth 2 (two) times daily.      . metoprolol tartrate (LOPRESSOR) 25 MG tablet Take 1 tablet (25 mg total) by mouth 2 (two) times daily.  60 tablet  3  . polyethylene glycol powder (MIRALAX) powder Take 17 g by mouth 2 (two) times daily as needed. For constipation      . timolol (TIMOPTIC) 0.5 % ophthalmic solution Place 1 drop into the right eye daily.        No current facility-administered medications for this visit.   Family History  Problem Relation Age of Onset  . Coronary artery disease Neg Hx     premature   History   Social History  . Marital Status: Single    Spouse Name: N/A    Number of Children: 1  . Years of Education: N/A   Occupational History  .     Social History Main Topics  . Smoking status: Never Smoker   . Smokeless tobacco: None  . Alcohol Use: No  . Drug Use: No  . Sexually Active: None   Other Topics Concern  . None   Social History Narrative   Originally from [place]. She has  been here in the [place] since the 60's.   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      Review of Systems: Review of Systems:  Constitutional:  Denies fever, chills, diaphoresis, appetite change and fatigue.   HEENT:  Denies congestion, sore throat, rhinorrhea, sneezing, mouth sores, trouble swallowing, neck pain   Respiratory:  Denies SOB, DOE, cough, and wheezing.   Cardiovascular:  Denies palpitations and positive for B/L leg swelling.   Gastrointestinal:  Denies nausea, vomiting, abdominal pain, diarrhea, constipation, blood in stool and abdominal distention.   Genitourinary:  Denies dysuria, urgency, frequency, hematuria, flank pain and difficulty urinating.   Musculoskeletal:  Denies myalgias, back pain, joint swelling, arthralgias and gait problem.   Skin:  Denies pallor, rash and wound.   Neurological:  Denies dizziness, seizures, syncope, weakness,  light-headedness, numbness and headaches.    .    Objective:  Physical Exam: Filed Vitals:   03/25/13 1006  BP: 123/77  Pulse: 68  Temp: 98 F (36.7 C)  TempSrc: Oral  Height: 5' 0.75" (1.543 m)  Weight: 138 lb 3.2 oz (62.687 kg)  SpO2: 100%   General: alert, well-developed, and cooperative to examination.  Head: normocephalic and atraumatic.  Eyes: vision grossly intact, pupils equal, pupils round, pupils reactive to light, no injection and anicteric.  Mouth: pharynx pink and moist, no erythema, and no exudates.  Neck: supple, full ROM, no thyromegaly, no JVD, and no carotid bruits.  Lungs: normal respiratory effort, no accessory muscle use, normal breath sounds,and no wheezes. LLL lung fine rales noted not cleared by cough.  Heart: normal rate, regular rhythm, no murmur, no gallop, and no rub.  Abdomen: soft, non-tender, normal bowel sounds, no distention, no guarding, no rebound tenderness, no hepatomegaly, and no splenomegaly.  Msk: no joint swelling, no joint warmth, and no redness over joints.  Pulses: 2+ DP/PT pulses bilaterally Extremities: No cyanosis, clubbing. Positive B/L pitting edema 2+ up to her calf.  Neurologic: alert & oriented X3, cranial nerves II-XII intact, strength normal in all extremities, sensation intact to light touch, and gait normal.  Skin: turgor normal and no rashes.  Psych: Oriented X3, memory intact for recent and remote, normally interactive, good eye contact, not anxious appearing, and not depressed appearing.    Assessment & Plan:

## 2013-03-25 NOTE — Assessment & Plan Note (Addendum)
This is her chronic history.  She has been followed by Dr. Luiz Blare, who referred her to Dr. Ave Filter for possible bilateral shoulder surgery.  She reports that Dr. Ave Filter discussed with her during a recent OV and requested her to "get records" from our clinic.   - I am not sure what kind of records Dr. Ave Filter requested. I suspect that Dr. Ave Filter would like to have pre-operative risk assessment. I would send a letter to inquiry a document from Dr. Veda Canning office with regards to the plan of surgery including the type, operative length and other pertinent details of the surgery. I will need to send an new referral for her to be evaluated by her cardiologist once we receive the document from her surgeon's office.  - I discussed with patient in detail about above plan.  She agrees with the plan.

## 2013-03-25 NOTE — Assessment & Plan Note (Addendum)
Patient reports approximately 65-month bilateral leg swelling which worsened over last month.  She had an office visit with the resident on 02/07/13, and was started on Lasix therapy. She responded to a short course treatment of Lasix 20 mg po daily, and her lasix was discontinued after the resolution of her LE edema on 02/18/13. She reports worsening of LE edema since mid May, and actually resumed her lasix one week ago without counseling any provider.  She reports mild baseline SOB with activity. Denies other associated discomfort. Denies orthopnea or PND. Denies drinking excess fluids. Denies weight gain. Denies history of heart or kidney failure, lung or liver disease. Denies taking any other drugs besides her prescription medications  Physical examination reveals LLL fine rales not cleared by cough, and B/L LE 2+ pitting edema.   Of note, last echo 04/08/11 with EF 55-60% mild LVH and grade 1 diastolic dysfxn.  The differential diagnosis for chronic lower extremity edema are broad.  --Need to rule out CHF given leg edema, LLL rales on exam, and history of grade 1 diastolic dysfunction and mild LVH. --Need to rule out myxedema --Need to rule out kidney disease.  History of mild renal insufficiency in the past.  However, her last renal function was normal.  Will repeat BMP and obtain UA. --Mild hypo-albuminemia with albumin 3.1 in May 2014--not proportion to her leg edema. --Cor pulmonale is unlikely since she has no documented lung disease.  --Etiology of her liver disease is unlikely since she has never had any abnormal liver workup. No history of liver disease.    - will check BMP, TSH, UA and ProBNP - will continue her lasix 20 bid for now. - will not pursue Echo for now unless her BNP is marked elevated.  She'll most likely need cardiology risk stratification of periop assessment pending receiving a letter from her surgeon's office.  We'll defer 2-D echo to her cardiologist.    Addendum ProBNP elevated at ~ 900, will order Echo.

## 2013-03-26 ENCOUNTER — Other Ambulatory Visit: Payer: Self-pay | Admitting: *Deleted

## 2013-03-26 DIAGNOSIS — M48061 Spinal stenosis, lumbar region without neurogenic claudication: Secondary | ICD-10-CM

## 2013-03-26 LAB — URINALYSIS, ROUTINE W REFLEX MICROSCOPIC
Nitrite: NEGATIVE
Protein, ur: NEGATIVE mg/dL
Specific Gravity, Urine: 1.011 (ref 1.005–1.030)
Urobilinogen, UA: 0.2 mg/dL (ref 0.0–1.0)

## 2013-03-27 MED ORDER — DICLOFENAC SODIUM 1 % TD GEL: 2.0000 g | Freq: Four times a day (QID) | TRANSDERMAL | Status: DC

## 2013-03-27 MED ORDER — GABAPENTIN 100 MG PO CAPS: 100.0000 mg | ORAL_CAPSULE | Freq: Every day | ORAL | Status: DC

## 2013-03-27 MED ORDER — METOPROLOL TARTRATE 25 MG PO TABS: 25.0000 mg | ORAL_TABLET | Freq: Two times a day (BID) | ORAL | Status: DC

## 2013-03-28 ENCOUNTER — Other Ambulatory Visit: Payer: Self-pay | Admitting: Internal Medicine

## 2013-03-28 DIAGNOSIS — M48061 Spinal stenosis, lumbar region without neurogenic claudication: Secondary | ICD-10-CM

## 2013-03-28 MED ORDER — FUROSEMIDE 20 MG PO TABS: 20.0000 mg | ORAL_TABLET | Freq: Every day | ORAL | Status: DC

## 2013-03-28 MED ORDER — FENTANYL 12 MCG/HR TD PT72: 1.0000 | MEDICATED_PATCH | TRANSDERMAL | Status: DC

## 2013-03-28 NOTE — Telephone Encounter (Signed)
Fentanyl patches Last filled 5/1 Call when ready @ [phone]

## 2013-03-28 NOTE — Telephone Encounter (Signed)
Refill printed and signed - nurse to complete. 

## 2013-04-01 NOTE — Addendum Note (Signed)
Addended byDierdre Searles, Bear Osten on: 04/01/2013 01:57 PM   Modules accepted: Orders

## 2013-04-02 ENCOUNTER — Telehealth: Payer: Self-pay | Admitting: *Deleted

## 2013-04-02 NOTE — Telephone Encounter (Signed)
Called pt about changing time of powered mobility device appt and was c/o of legs.I called pt and talked with pt - no change with swelling in legs. To call clinic if swelling gets worse. Reminded pt about appt for 2D echo - CNA was going to change appt. Stanton Kidney Romilda Proby RN 04/02/13 3:15PM

## 2013-04-02 NOTE — Progress Notes (Signed)
Message left on home phone recording 2D echo sch [facility] 04/11/13 11AM - no restrictions. Unable to reach pt or CNA on cell phone or home phone. Stanton Kidney Ellamarie Naeve RN 04/02/13 11AM

## 2013-04-04 NOTE — Assessment & Plan Note (Signed)
She has had surgery on her spine before and she has signs of possible nerve root compression.  We discussed going back to surgery and seeing if they felt she would be a candidate for further surgery.  She states that she woul dlike to avoid that if she could.  We will continue the fentanyl patch today and add gabapentin 100 mg TID for now.  She will return in 4 weeks to see how she is doing.  WE discussed that if she started to notice weakness in the leg, urine or stooling changes, or numbness in her groin that she should return immediately.

## 2013-04-05 DIAGNOSIS — R0602 Shortness of breath: Secondary | ICD-10-CM | POA: Insufficient documentation

## 2013-04-09 ENCOUNTER — Ambulatory Visit (INDEPENDENT_AMBULATORY_CARE_PROVIDER_SITE_OTHER): Payer: Medicare Other | Admitting: Internal Medicine

## 2013-04-09 ENCOUNTER — Ambulatory Visit: Payer: Medicare Other | Admitting: Internal Medicine

## 2013-04-09 ENCOUNTER — Encounter: Payer: Self-pay | Admitting: Internal Medicine

## 2013-04-09 VITALS — BP 128/74 | HR 67 | Temp 98.2°F | Ht 60.75 in | Wt 138.5 lb

## 2013-04-09 DIAGNOSIS — G894 Chronic pain syndrome: Secondary | ICD-10-CM

## 2013-04-09 DIAGNOSIS — I639 Cerebral infarction, unspecified: Secondary | ICD-10-CM

## 2013-04-09 DIAGNOSIS — M810 Age-related osteoporosis without current pathological fracture: Secondary | ICD-10-CM

## 2013-04-09 DIAGNOSIS — I6350 Cerebral infarction due to unspecified occlusion or stenosis of unspecified cerebral artery: Secondary | ICD-10-CM

## 2013-04-09 DIAGNOSIS — I10 Essential (primary) hypertension: Secondary | ICD-10-CM

## 2013-04-09 HISTORY — DX: Cerebral infarction, unspecified: I63.9

## 2013-04-09 HISTORY — DX: Chronic pain syndrome: G89.4

## 2013-04-09 HISTORY — DX: Age-related osteoporosis without current pathological fracture: M81.0

## 2013-04-09 NOTE — Progress Notes (Signed)
  Subjective:    Patient ID: [name], female    DOB: June 10, 1925, 79 y.o.   MRN: [id number]  HPI  [name] is here with her aide and [employer] rep Zenia Resides to discuss power wheelchair.  Of note, the aide asks about the painful B LE edema. Stockings have been tried but are painful. Lasix has been tried but was stopped once the fluid resolved. The pt restarted the lasix on her own. ECHO is sch for tomorrow. UA negative for protein. TSH nl. LFT's nl. Pro-BNP in 900's.   The aide also asks about ASA and whether she still needs it. States was on it for an aneurysm. Denies stroke, heart attack. Imaging reviewed and found no evidence of aneurysm but did have lacunar infarcts on prior CVA.    Review of Systems  Constitutional: Negative for activity change and appetite change.  Gastrointestinal: Negative for diarrhea.  Musculoskeletal: Positive for back pain, arthralgias and gait problem.  Neurological: Positive for weakness.  Psychiatric/Behavioral: Positive for sleep disturbance.       Objective:   Physical Exam  Constitutional: She is oriented to person, place, and time. She appears well-developed and well-nourished. No distress.  HENT:  Head: Normocephalic and atraumatic.  Right Ear: External ear normal.  Left Ear: External ear normal.  Nose: Nose normal.  Eyes: EOM are normal.  Musculoskeletal: She exhibits edema and tenderness.  RUE overall 2/5. R grip, wrist, elbow, shoulder ROM all limited 2/2 ortho condition in addition to weakness.   LUE overall 3/5.   B LE ankle and foot flexion and extension 4/5, B hip ABD and ADDuction 4/5. Hip flexion 2+/5 B. Knee flexion and extension 3/5.   Neurological: She is alert and oriented to person, place, and time.  Skin: Skin is warm and dry. She is not diaphoretic. No erythema.  Psychiatric: She has a normal mood and affect. Her behavior is normal. Judgment and thought content normal.          Assessment & Plan:

## 2013-04-09 NOTE — Assessment & Plan Note (Signed)
I was unable to find evidence of an aneurysm that the pt describes. However, based on the MRI findings, she needs to cont the baby ASA. Her lipids an BP are well controlled.

## 2013-04-09 NOTE — Patient Instructions (Addendum)
Make an appt to talk about your pain. Get your ECHO / heart study tomorrow.

## 2013-04-09 NOTE — Assessment & Plan Note (Signed)
I found her DEXA results and have updated the PMHs and PL. PCP will need to discuss reimaging and treating since pt is high fall risk.

## 2013-04-09 NOTE — Assessment & Plan Note (Signed)
She denied falls today but her last two fall screens indicated falls in the prior year. She is afraid of falling. Her pain and decreased ROM also increase the chance of falling.  Due to significant gait instability and significant back and shoulder pain patient has required a walker. At this point she is no longer able to safely operate a cane or walker as well as a manuel wheelchair due to high risk of falls considering her worsening gait instability, right upper extremity strength of 2/5 and left upper extremity strength of 3/5 and pain of 8-10/10 in severity . She no longer can move from room to room safely to make her food or do toileting.   The patient cannot safely transfer into and out of a scooter (POV) because of her significant limitations in UE muscle strength (2-3 / 5) and pain in the shoulder between 8-10/10. The patient is alert and cooperative and feels that she could safely operate a power wheelchair in the home. The patient is willing and motivated to use the power wheelchair.   She has painful B lower extremity edema, +1, with increased pigmentation changes characteristic of chronic venous insuff and therefore would benefit from elevated leg rests. She has tried and failed stockings and lasix is not providing relief.   For the above-mentioned reasons, I [name] would benefit from motorized wheelchair. She has limitation in multiple mobility related activities of daily living and therefore, in my opinion would be a good candidate for motorized wheelchair

## 2013-04-10 ENCOUNTER — Ambulatory Visit (HOSPITAL_COMMUNITY)
Admission: RE | Admit: 2013-04-10 | Discharge: 2013-04-10 | Disposition: A | Discharge status: Home / Self Care | Payer: Medicare Other | Source: Ambulatory Visit | Attending: Internal Medicine | Admitting: Internal Medicine

## 2013-04-10 DIAGNOSIS — I080 Rheumatic disorders of both mitral and aortic valves: Secondary | ICD-10-CM | POA: Insufficient documentation

## 2013-04-10 DIAGNOSIS — I5189 Other ill-defined heart diseases: Secondary | ICD-10-CM

## 2013-04-10 DIAGNOSIS — I509 Heart failure, unspecified: Secondary | ICD-10-CM | POA: Insufficient documentation

## 2013-04-10 DIAGNOSIS — I079 Rheumatic tricuspid valve disease, unspecified: Secondary | ICD-10-CM | POA: Insufficient documentation

## 2013-04-10 DIAGNOSIS — I369 Nonrheumatic tricuspid valve disorder, unspecified: Secondary | ICD-10-CM

## 2013-04-10 NOTE — Progress Notes (Signed)
  Echocardiogram 2D Echocardiogram has been performed.  Consepcion Utt, [facility] 04/10/2013, 11:44 AM

## 2013-04-11 ENCOUNTER — Ambulatory Visit (HOSPITAL_COMMUNITY): Payer: Medicare Other

## 2013-04-22 ENCOUNTER — Ambulatory Visit (INDEPENDENT_AMBULATORY_CARE_PROVIDER_SITE_OTHER): Payer: Medicare Other | Admitting: Internal Medicine

## 2013-04-22 ENCOUNTER — Encounter: Payer: Self-pay | Admitting: Internal Medicine

## 2013-04-22 VITALS — BP 123/78 | HR 68 | Temp 98.2°F | Ht 61.0 in | Wt 132.9 lb

## 2013-04-22 DIAGNOSIS — R609 Edema, unspecified: Secondary | ICD-10-CM

## 2013-04-22 DIAGNOSIS — I639 Cerebral infarction, unspecified: Secondary | ICD-10-CM

## 2013-04-22 DIAGNOSIS — I5189 Other ill-defined heart diseases: Secondary | ICD-10-CM

## 2013-04-22 DIAGNOSIS — I6350 Cerebral infarction due to unspecified occlusion or stenosis of unspecified cerebral artery: Secondary | ICD-10-CM

## 2013-04-22 DIAGNOSIS — M25511 Pain in right shoulder: Secondary | ICD-10-CM

## 2013-04-22 DIAGNOSIS — I519 Heart disease, unspecified: Secondary | ICD-10-CM

## 2013-04-22 DIAGNOSIS — R600 Localized edema: Secondary | ICD-10-CM

## 2013-04-22 DIAGNOSIS — M25519 Pain in unspecified shoulder: Secondary | ICD-10-CM

## 2013-04-22 MED ORDER — FUROSEMIDE 20 MG PO TABS: 20.0000 mg | ORAL_TABLET | Freq: Every day | ORAL | Status: DC

## 2013-04-22 NOTE — Assessment & Plan Note (Signed)
Continue baby aspirin 81 mg qd

## 2013-04-22 NOTE — Patient Instructions (Addendum)
Follow up in 3 months, sooner if needed  Pick up your medications to the pharmacy   Hip Pain The hips join the upper legs to the lower pelvis. The bones, cartilage, tendons, and muscles of the hip joint perform a lot of work each day holding your body weight and allowing you to move around. Hip pain is a common symptom. It can range from a minor ache to severe pain on 1 or both hips. Pain may be felt on the inside of the hip joint near the groin, or the outside near the buttocks and upper thigh. There may be swelling or stiffness as well. It occurs more often when a person walks or performs activity. There are many reasons hip pain can develop. CAUSES  It is important to work with your caregiver to identify the cause since many conditions can impact the bones, cartilage, muscles, and tendons of the hips. Causes for hip pain include:  Broken (fractured) bones.  Separation of the thighbone from the hip socket (dislocation).  Torn cartilage of the hip joint.  Swelling (inflammation) of a tendon (tendonitis), the sac within the hip joint (bursitis), or a joint.  A weakening in the abdominal wall (hernia), affecting the nerves to the hip.  Arthritis in the hip joint or lining of the hip joint.  Pinched nerves in the back, hip, or upper thigh.  A bulging disc in the spine (herniated disc).  Rarely, bone infection or cancer. DIAGNOSIS  The location of your hip pain will help your caregiver understand what may be causing the pain. A diagnosis is based on your medical history, your symptoms, results from your physical exam, and results from diagnostic tests. Diagnostic tests may include X-ray exams, a computerized magnetic scan (magnetic resonance imaging, MRI), or bone scan. TREATMENT  Treatment will depend on the cause of your hip pain. Treatment may include:  Limiting activities and resting until symptoms improve.  Crutches or other walking supports (a cane or brace).  Ice, elevation,  and compression.  Physical therapy or home exercises.  Shoe inserts or special shoes.  Losing weight.  Medications to reduce pain.  Undergoing surgery. HOME CARE INSTRUCTIONS   Only take over-the-counter or prescription medicines for pain, discomfort, or fever as directed by your caregiver.  Put ice on the injured area:  Put ice in a plastic bag.  Place a towel between your skin and the bag.  Leave the ice on for 15-20 minutes at a time, 3-4 times a day.  Keep your leg raised (elevated) when possible to lessen swelling.  Avoid activities that cause pain.  Follow specific exercises as directed by your caregiver.  Sleep with a pillow between your legs on your most comfortable side.  Record how often you have hip pain, the location of the pain, and what it feels like. This information may be helpful to you and your caregiver.  Ask your caregiver about returning to work or sports and whether you should drive.  Follow up with your caregiver for further exams, therapy, or testing as directed. SEEK MEDICAL CARE IF:   Your pain or swelling continues or worsens after 1 week.  You are feeling unwell or have chills.  You have increasing difficulty with walking.  You have a loss of sensation or other new symptoms.  You have questions or concerns. SEEK IMMEDIATE MEDICAL CARE IF:   You cannot put weight on the affected hip.  You have fallen.  You have a sudden increase in pain and  swelling in your hip.  You have a fever. MAKE SURE YOU:   Understand these instructions.  Will watch your condition.  Will get help right away if you are not doing well or get worse. Document Released: 03/16/2010 Document Revised: 12/19/2011 Document Reviewed: 03/16/2010 [facility] Patient Information 2014 [place], [place].  Hypertension As your heart beats, it forces blood through your arteries. This force is your blood pressure. If the pressure is too high, it is called hypertension (HTN)  or high blood pressure. HTN is dangerous because you may have it and not know it. High blood pressure may mean that your heart has to work harder to pump blood. Your arteries may be narrow or stiff. The extra work puts you at risk for heart disease, stroke, and other problems.  Blood pressure consists of two numbers, a higher number over a lower, 110/72, for example. It is stated as "110 over 72." The ideal is below 120 for the top number (systolic) and under 80 for the bottom (diastolic). Write down your blood pressure today. You should pay close attention to your blood pressure if you have certain conditions such as:  Heart failure.  Prior heart attack.  Diabetes  Chronic kidney disease.  Prior stroke.  Multiple risk factors for heart disease. To see if you have HTN, your blood pressure should be measured while you are seated with your arm held at the level of the heart. It should be measured at least twice. A one-time elevated blood pressure reading (especially in the Emergency Department) does not mean that you need treatment. There may be conditions in which the blood pressure is different between your right and left arms. It is important to see your caregiver soon for a recheck. Most people have essential hypertension which means that there is not a specific cause. This type of high blood pressure may be lowered by changing lifestyle factors such as:  Stress.  Smoking.  Lack of exercise.  Excessive weight.  Drug/tobacco/alcohol use.  Eating less salt. Most people do not have symptoms from high blood pressure until it has caused damage to the body. Effective treatment can often prevent, delay or reduce that damage. TREATMENT  When a cause has been identified, treatment for high blood pressure is directed at the cause. There are a large number of medications to treat HTN. These fall into several categories, and your caregiver will help you select the medicines that are best for  you. Medications may have side effects. You should review side effects with your caregiver. If your blood pressure stays high after you have made lifestyle changes or started on medicines,   Your medication(s) may need to be changed.  Other problems may need to be addressed.  Be certain you understand your prescriptions, and know how and when to take your medicine.  Be sure to follow up with your caregiver within the time frame advised (usually within two weeks) to have your blood pressure rechecked and to review your medications.  If you are taking more than one medicine to lower your blood pressure, make sure you know how and at what times they should be taken. Taking two medicines at the same time can result in blood pressure that is too low. SEEK IMMEDIATE MEDICAL CARE IF:  You develop a severe headache, blurred or changing vision, or confusion.  You have unusual weakness or numbness, or a faint feeling.  You have severe chest or abdominal pain, vomiting, or breathing problems. MAKE SURE YOU:  Understand these instructions.  Will watch your condition.  Will get help right away if you are not doing well or get worse. Document Released: 09/26/2005 Document Revised: 12/19/2011 Document Reviewed: 05/16/2008 [facility] Patient Information 2014 [place], [place].  Shoulder Pain The shoulder is the joint that connects your arms to your body. The bones that form the shoulder joint include the upper arm bone (humerus), the shoulder blade (scapula), and the collarbone (clavicle). The top of the humerus is shaped like a ball and fits into a rather flat socket on the scapula (glenoid cavity). A combination of muscles and strong, fibrous tissues that connect muscles to bones (tendons) support your shoulder joint and hold the ball in the socket. Small, fluid-filled sacs (bursae) are located in different areas of the joint. They act as cushions between the bones and the overlying soft tissues and  help reduce friction between the gliding tendons and the bone as you move your arm. Your shoulder joint allows a wide range of motion in your arm. This range of motion allows you to do things like scratch your back or throw a ball. However, this range of motion also makes your shoulder more prone to pain from overuse and injury. Causes of shoulder pain can originate from both injury and overuse and usually can be grouped in the following four categories:  Redness, swelling, and pain (inflammation) of the tendon (tendinitis) or the bursae (bursitis).  Instability, such as a dislocation of the joint.  Inflammation of the joint (arthritis).  Broken bone (fracture). HOME CARE INSTRUCTIONS   Apply ice to the sore area.  Put ice in a plastic bag.  Place a towel between your skin and the bag.  Leave the ice on for 15-20 minutes, 3-4 times per day for the first 2 days.  Stop using cold packs if they do not help with the pain.  If you have a shoulder sling or immobilizer, wear it as long as your caregiver instructs. Only remove it to shower or bathe. Move your arm as little as possible, but keep your hand moving to prevent swelling.  Squeeze a soft ball or foam pad as much as possible to help prevent swelling.  Only take over-the-counter or prescription medicines for pain, discomfort, or fever as directed by your caregiver. SEEK MEDICAL CARE IF:   Your shoulder pain increases, or new pain develops in your arm, hand, or fingers.  Your hand or fingers become cold and numb.  Your pain is not relieved with medicines. SEEK IMMEDIATE MEDICAL CARE IF:   Your arm, hand, or fingers are numb or tingling.  Your arm, hand, or fingers are significantly swollen or turn white or blue. MAKE SURE YOU:   Understand these instructions.  Will watch your condition.  Will get help right away if you are not doing well or get worse. Document Released: 07/06/2005 Document Revised: 06/20/2012 Document  Reviewed: 09/10/2011 [facility] Patient Information 2014 [place], [place].

## 2013-04-22 NOTE — Assessment & Plan Note (Addendum)
R>L with recent Xray at Dr. Selina Cooley office with b/l shoulder arthritis R>L. Likely rotator cuff arthropathy.   Will try PT before more invasive procedures i.e Surgery Will have follow with [facility] orthopedics as well  Try voltaren gel in the area and heat

## 2013-04-22 NOTE — Assessment & Plan Note (Signed)
Repeat echo with similar reading Will continue Lasix 20 mg qd  Repeat BMET at f/u Mild lower extremity edema 1+-will Rx TED hose

## 2013-04-22 NOTE — Assessment & Plan Note (Signed)
Improved  Rx TED hose. Continue Lasix 20 qd Encouraged to decrease salt intake, fluid intake, elevated legs

## 2013-04-22 NOTE — Progress Notes (Signed)
  Subjective:    Patient ID: [name], female    DOB: 1925/07/12, 79 y.o.   MRN: [id number]  HPI Comments: 79 y.o [place] woman PMH stroke, HTN (BP 123/78), osteoporosis, atrial tachycardia, chronic pain, grade 1 diastolic dysfunction, GERD, RLS.  She presents to review echo results. Echo recently showed EF 55-60% grade 1 diastolic dysfunction, mild LVH.  [name] b/l shoulder pain right >left with limited ROM and she can barely wipe herself after going to the restroom.  She also has trouble with overhead lifting.  Dr. Kelli Hope of shoulders reveals b/l shoulder arthritis right >left and likely rotator cuff arthropathy.  Patient wants to know opinion of pursing surgery.  Fentanyl patch helps her back pain and Voltaren lasts temporarily.  She has tried Ultram and and Lidoderm patches andTylenol in the past for pain w/o relief.  Her aid is at the visit and along with church members think the patient should avoid surgery if possible but the patient states she cant live like this for the rest of her life.  She needs Rx refill of Lasix and states the swelling in her legs has gone down.       Review of Systems  Respiratory: Negative for shortness of breath.   Cardiovascular: Negative for chest pain.       Decreased leg swelling  Musculoskeletal: Positive for arthralgias.       Objective:   Physical Exam  Nursing note and vitals reviewed. Constitutional: She is oriented to person, place, and time. Vital signs are normal. She appears well-developed and well-nourished. She is cooperative. No distress.  HENT:  Head: Normocephalic and atraumatic.  Mouth/Throat: No oropharyngeal exudate.  Eyes: Conjunctivae are normal. Right eye exhibits no discharge. Left eye exhibits no discharge. No scleral icterus.  Cardiovascular: Normal rate, regular rhythm, S1 normal, S2 normal and normal heart sounds.   No murmur heard. 1+ lower ext edema b/l  Pulmonary/Chest: Effort normal and breath sounds normal. No  respiratory distress. She has no wheezes.  Abdominal: Soft. Bowel sounds are normal. There is no tenderness.  Musculoskeletal:       Right shoulder: She exhibits decreased range of motion and tenderness.       Left shoulder: She exhibits decreased range of motion.  Right arm with decreased adduction and overhead position. Left arm/shoulder with more ROM  Pain with ROM R>L  Neurological: She is alert and oriented to person, place, and time.  Patient in wheelchair  Skin: Skin is warm, dry and intact. No rash noted. She is not diaphoretic.  Psychiatric: She has a normal mood and affect. Her speech is normal and behavior is normal. Judgment and thought content normal. Cognition and memory are normal.          Assessment & Plan:  F/u 3 mos.  Check BMET at f/u  Will refer to PT outpatient for right rotator cuff arthropathy

## 2013-04-25 ENCOUNTER — Ambulatory Visit: Payer: Medicare Other | Admitting: Oncology

## 2013-04-25 ENCOUNTER — Other Ambulatory Visit: Payer: Medicare Other | Admitting: Lab

## 2013-04-25 ENCOUNTER — Other Ambulatory Visit (HOSPITAL_BASED_OUTPATIENT_CLINIC_OR_DEPARTMENT_OTHER): Payer: Medicare Other | Admitting: Lab

## 2013-04-25 ENCOUNTER — Ambulatory Visit (HOSPITAL_BASED_OUTPATIENT_CLINIC_OR_DEPARTMENT_OTHER): Payer: Medicare Other | Admitting: Oncology

## 2013-04-25 ENCOUNTER — Telehealth: Payer: Self-pay | Admitting: Oncology

## 2013-04-25 VITALS — BP 135/55 | HR 60 | Temp 98.2°F | Resp 18 | Ht 61.0 in | Wt 134.0 lb

## 2013-04-25 DIAGNOSIS — D509 Iron deficiency anemia, unspecified: Secondary | ICD-10-CM

## 2013-04-25 DIAGNOSIS — E611 Iron deficiency: Secondary | ICD-10-CM

## 2013-04-25 DIAGNOSIS — D649 Anemia, unspecified: Secondary | ICD-10-CM

## 2013-04-25 LAB — CBC WITH DIFFERENTIAL/PLATELET
Basophils Absolute: 0 10*3/uL (ref 0.0–0.1)
EOS%: 2.7 % (ref 0.0–7.0)
Eosinophils Absolute: 0.2 10*3/uL (ref 0.0–0.5)
HCT: 35.7 % (ref 34.8–46.6)
HGB: 11.7 g/dL (ref 11.6–15.9)
MCH: 31.6 pg (ref 25.1–34.0)
MONO#: 0.7 10*3/uL (ref 0.1–0.9)
NEUT#: 3.7 10*3/uL (ref 1.5–6.5)
NEUT%: 47.9 % (ref 38.4–76.8)
RDW: 13.8 % (ref 11.2–14.5)
WBC: 7.7 10*3/uL (ref 3.9–10.3)
lymph#: 3 10*3/uL (ref 0.9–3.3)

## 2013-04-25 NOTE — Progress Notes (Signed)
[facility] OFFICE PROGRESS NOTE  Cc:  Aletta Edouard, MD  DIAGNOSIS:  History of iron deficiency anemia with negative GI work up.   CURRENT TREATMENT:  Oral iron supplementation.   INTERVAL HISTORY: [name] 79 y.o. female returns for regular follow up with a caregiver.  She reports right shoulder pain with work up thought to be rotator cuff issue.  She is due to get PT.  She is taking oral iron without problem. She has mild fatigue and needs help with activities around the house.   Patient denies fever, anorexia, weight loss, headache, visual changes, confusion, drenching night sweats, palpable lymph node swelling, mucositis, odynophagia, dysphagia, nausea vomiting, jaundice, chest pain, palpitation, shortness of breath, dyspnea on exertion, productive cough, gum bleeding, epistaxis, hematemesis, hemoptysis, abdominal pain, abdominal swelling, early satiety, melena, hematochezia, hematuria, skin rash, spontaneous bleeding,heat or cold intolerance, bowel bladder incontinence, back pain, focal motor weakness, paresthesia, depression.      MEDICAL HISTORY: Past Medical History  Diagnosis Date  . Hypertension   . Head ache   . Renal insufficiency, mild   . Depression   . Fecal occult blood test positive   . Restless leg syndrome   . Anemia   . Cataracts, bilateral   . Hemorrhoids   . Rupture of rotator cuff of shoulder     s/p repair by Dr. Luiz Blare 7/07. Follows with Dr. Ave Filter for possible shoulder surgery.   . Lumbar spinal stenosis     gets Spinal injections by Dr. Callie Fielding   . Postnasal drip   . Atrial tachycardia     echo 04/08/11: EF 55-60%, mild LVH, grade 1 diast dysfxn;    s/p RFCA with Dr. Ladona Ridgel  04/09/11  . Hyperkalemia 04/08/2011  . Unspecified deficiency anemia   . Osteoporosis, unspecified 04/09/2013    DEXA 5/07 : T L forearm -2.9. L femur -0.6, R femur -1.4. [name]  . CVA (cerebral infarction) 04/09/2013    MRI 2005 :  Multiple tiny old lacunar infarcts as well as scattered white matter small vessel ischemic disease. Pt asymptomatic and denies TIA / CVA hx.    . Chronic pain syndrome 04/09/2013    On chronic opioids. Cervical MRI 11/ 2012 : Diffuse cervical spondylosis, Diffuse degenerative disc disease with multilevel foraminal stenosis. Possible T1 nerve root involvement. Lumbar MRI 2009 : prior fusion at L3-4 and degenerative disc disease Rotator cuff repair : Dr. Luiz Blare 7/07. Follows with Dr. Ave Filter for possible shoulder surgery.  H/O lumbar spinal stenosis with steroid injections. Mo  . Arthritis     b/l shoulder R>L  . Rotator cuff tear arthropathy     03/22/13 dx [facility] (Dr. Ave Filter) considering surgery    SURGICAL HISTORY:  Past Surgical History  Procedure Laterality Date  . Doppler echocardiography  2004  . Back surgery      lumbar x 2, 2000 and 2004 by Dr. Lelon Perla.   . Rotator cuff repair      right    MEDICATIONS: Current Outpatient Prescriptions  Medication Sig Dispense Refill  . aspirin 81 MG tablet Take 81 mg by mouth daily.        . carboxymethylcellulose (REFRESH PLUS) 0.5 % SOLN 1 drop 3 (three) times daily as needed.      . diclofenac sodium (VOLTAREN) 1 % GEL Apply 2 g topically 4 (four) times daily.  100 g  2  . fentaNYL (DURAGESIC - DOSED MCG/HR) 12  MCG/HR Place 1 patch (12.5 mcg total) onto the skin every 3 (three) days.  10 patch  0  . furosemide (LASIX) 20 MG tablet Take 1 tablet (20 mg total) by mouth daily.  30 tablet  1  . gabapentin (NEURONTIN) 100 MG capsule Take 1 capsule (100 mg total) by mouth at bedtime.  30 capsule  1  . iron polysaccharides (NIFEREX) 150 MG capsule Take 150 mg by mouth 2 (two) times daily.      . metoprolol tartrate (LOPRESSOR) 25 MG tablet Take 1 tablet (25 mg total) by mouth 2 (two) times daily.  60 tablet  3  . polyethylene glycol powder (MIRALAX) powder Take 17 g by mouth 2 (two) times daily as needed. For constipation      .  timolol (TIMOPTIC) 0.5 % ophthalmic solution Place 1 drop into the right eye daily.        No current facility-administered medications for this visit.    ALLERGIES:  is allergic to codeine; ibuprofen; and oxycodone hcl er.  REVIEW OF SYSTEMS:  The rest of the 14-point review of system was negative.   Filed Vitals:   04/25/13 1028  BP: 135/55  Pulse: 60  Temp: 98.2 F (36.8 C)  Resp: 18   Wt Readings from Last 3 Encounters:  04/25/13 134 lb (60.782 kg)  04/22/13 132 lb 14.4 oz (60.283 kg)  04/09/13 138 lb 8 oz (62.823 kg)   ECOG Performance status: 1  PHYSICAL EXAMINATION:  General:  Thin-appearing woman in no acute distress.  Eyes:  no scleral icterus.  ENT:  There were no oropharyngeal lesions.  Neck was without thyromegaly.  Lymphatics:  Negative cervical, supraclavicular or axillary adenopathy.  Respiratory: lungs were clear bilaterally without wheezing or crackles.  Cardiovascular:  Regular rate and rhythm, S1/S2, without murmur, rub or gallop.  There was no pedal edema.  GI:  abdomen was soft, flat, nontender, nondistended, without organomegaly.  Muscoloskeletal:  no spinal tenderness of palpation of vertebral spine.  Skin exam was without echymosis, petichae.  Neuro exam was nonfocal.  Patient needed help to  get on and off exam table.  Gait was weak. Patient was alert and oriented.  Attention was good.   Language was appropriate.  Mood was normal without depression.  Speech was not pressured.  Thought content was not tangential.     LABORATORY/RADIOLOGY DATA:  Lab Results  Component Value Date   WBC 7.7 04/25/2013   HGB 11.7 04/25/2013   HCT 35.7 04/25/2013   PLT 189 04/25/2013   GLUCOSE 83 03/25/2013   CHOL  Value: 116        ATP III CLASSIFICATION:  <200     mg/dL   Desirable  409-811  mg/dL   Borderline High  >=914    mg/dL   High        7/82/9562   TRIG 190* 01/08/2011   HDL 43 01/08/2011   LDLCALC  Value: 35        Total Cholesterol/HDL:CHD Risk Coronary Heart Disease  Risk Table                     Men   Women  1/2 Average Risk   3.4   3.3  Average Risk       5.0   4.4  2 X Average Risk   9.6   7.1  3 X Average Risk  23.4   11.0        Use the calculated  Patient Ratio above and the CHD Risk Table to determine the patient's CHD Risk.        ATP III CLASSIFICATION (LDL):  <100     mg/dL   Optimal  914-782  mg/dL   Near or Above                    Optimal  130-159  mg/dL   Borderline  956-213  mg/dL   High  >086     mg/dL   Very High 5/78/4696   ALT 30 02/08/2013   AST 74* 02/08/2013   NA 144 03/25/2013   K 3.5 03/25/2013   CL 107 03/25/2013   CREATININE 0.95 03/25/2013   BUN 12 03/25/2013   CO2 28 03/25/2013   TSH 0.760 03/25/2013   HGBA1C  Value: 6.0 (NOTE)                                                                       According to the ADA Clinical Practice Recommendations for 2011, when HbA1c is used as a screening test:   >=6.5%   Diagnostic of Diabetes Mellitus           (if abnormal result  is confirmed)  5.7-6.4%   Increased risk of developing Diabetes Mellitus  References:Diagnosis and Classification of Diabetes Mellitus,Diabetes Care,2011,34(Suppl 1):S62-S69 and Standards of Medical Care in         Diabetes - 2011,Diabetes Care,2011,34  (Suppl 1):S11-S61.* 01/08/2011    ASSESSMENT AND PLAN:    1. Anemia of iron deficiency:  Negative GI work up.  Most likely due to poor GI absorption.  She is taking oral iron with normalization of her Hgb wihtout major side effects.  We will continue to monitor her CBC and iron panel.    2. Follow up:  CBC here at the [facility] every 3-4 months. Return visit in about 1 year.   I informed Ms. Taves that I am leaving the practice tomorrow.  [facility] will [facility] for her to see another provider when she returns.    The length of time of the face-to-face encounter was 10 minutes. More than 50% of time was spent counseling and coordination of care.

## 2013-04-25 NOTE — Telephone Encounter (Signed)
gv and printed appt sched and avs for pt  °

## 2013-04-29 NOTE — Progress Notes (Signed)
Case discussed with Dr. McLean at the time of the visit.  We reviewed the resident's history and exam and pertinent patient test results.  I agree with the assessment, diagnosis, and plan of care documented in the resident's note.     

## 2013-05-09 ENCOUNTER — Other Ambulatory Visit: Payer: Medicare Other | Admitting: Lab

## 2013-05-09 ENCOUNTER — Ambulatory Visit: Payer: Medicare Other | Admitting: Oncology

## 2013-05-13 ENCOUNTER — Other Ambulatory Visit: Payer: Self-pay | Admitting: *Deleted

## 2013-05-13 DIAGNOSIS — M48061 Spinal stenosis, lumbar region without neurogenic claudication: Secondary | ICD-10-CM

## 2013-05-13 MED ORDER — FENTANYL 12 MCG/HR TD PT72: 1.0000 | MEDICATED_PATCH | TRANSDERMAL | Status: DC

## 2013-05-15 ENCOUNTER — Ambulatory Visit
Admission: RE | Admit: 2013-05-15 | Discharge: 2013-05-15 | Disposition: A | Discharge status: Home / Self Care | Payer: Medicare Other | Source: Ambulatory Visit | Attending: Sports Medicine | Admitting: Sports Medicine

## 2013-05-15 ENCOUNTER — Ambulatory Visit (INDEPENDENT_AMBULATORY_CARE_PROVIDER_SITE_OTHER): Payer: Medicare Other | Admitting: Sports Medicine

## 2013-05-15 VITALS — BP 123/67 | Ht 62.0 in | Wt 134.0 lb

## 2013-05-15 DIAGNOSIS — M25561 Pain in right knee: Secondary | ICD-10-CM

## 2013-05-15 DIAGNOSIS — M25562 Pain in left knee: Secondary | ICD-10-CM

## 2013-05-15 DIAGNOSIS — M1711 Unilateral primary osteoarthritis, right knee: Secondary | ICD-10-CM

## 2013-05-15 DIAGNOSIS — M1712 Unilateral primary osteoarthritis, left knee: Secondary | ICD-10-CM

## 2013-05-15 DIAGNOSIS — M1710 Unilateral primary osteoarthritis, unspecified knee: Secondary | ICD-10-CM

## 2013-05-15 DIAGNOSIS — M25569 Pain in unspecified knee: Secondary | ICD-10-CM

## 2013-05-15 MED ORDER — METHYLPREDNISOLONE ACETATE 40 MG/ML IJ SUSP
40.0000 mg | Freq: Once | INTRAMUSCULAR | Status: AC
  Administered 2013-05-15: 40 mg via INTRA_ARTICULAR

## 2013-05-15 NOTE — Progress Notes (Signed)
  Subjective:    Patient ID: [name], female    DOB: 09-May-1925, 79 y.o.   MRN: [id number]  HPI chief complaint: Bilateral knee pain  Patient comes in today with returning bilateral knee pain. She is here today with her caregiver who is also her next-door neighbor. Patient is complaining of diffuse knee pain which is been present for several months. As a result of the pain she is becoming more and more immobile and weaker. This has led to a couple of falls. She's not noticed any swelling. She does get low back pain and has had lumbar ESI's with Dr. Maurice Small in the past but her knee pain is different in nature than what she experienced with her low back in the past. She comes in today in a wheelchair but usually ambulates with a walker. She denies pain in her groin.    Review of Systems     Objective:   Physical Exam Well-developed, well-nourished. No acute distress. Awake alert and oriented x3. Vital signs are reviewed.  Patient is sitting comfortably in a wheelchair. Negative log roll bilaterally. Each knee shows range of motion from 0-110. No effusion. There is tenderness to palpation along medial and lateral joint lines. Some tenderness to palpation over the patella as well but extensor mechanism is intact. Knees are grossly stable to ligamentous exam.  X-rays of each knee including AP and lateral views show no obvious fracture or effusion. She has moderate tricompartmental degenerative changes.         Assessment & Plan:  1. Bilateral knee pain secondary to DJD  I recommended bilateral cortisone injections. Each knee was injected using an anterior medial approach. Patient tolerated this without difficulty. If she experiences several months of relief with today's injections I would be happy to repeat them again. I would also like for her to start doing some simple isometric quad exercises to help strengthen her lower extremities. She has already been to formal physical therapy for  her low back and feels like that actually made her pain worse so I'm going to defer a return to physical therapy at this time. Followup when necessary.  Consent obtained and verified. Time-out conducted. Noted no overlying erythema, induration, or other signs of local infection. Skin prepped in a sterile fashion. Topical analgesic spray: Ethyl chloride. Joint: right knee Needle: 25g 1 1/2 inch Completed without difficulty. Meds: 3cc 1% xylocaine, 1cc (40mg ) depomedrol  Advised to call if fevers/chills, erythema, induration, drainage, or persistent bleeding.  Consent obtained and verified. Time-out conducted. Noted no overlying erythema, induration, or other signs of local infection. Skin prepped in a sterile fashion. Topical analgesic spray: Ethyl chloride. Joint: left knee Needle: 25g 1 1/2 inch Completed without difficulty. Meds: 3cc 1% xylocaine, 1cc (40mg ) depomedrol  Advised to call if fevers/chills, erythema, induration, drainage, or persistent bleeding.

## 2013-05-15 NOTE — Addendum Note (Signed)
Addended by: Neomia Dear on: 05/15/2013 07:20 PM   Modules accepted: Orders

## 2013-06-11 ENCOUNTER — Other Ambulatory Visit: Payer: Self-pay | Admitting: Internal Medicine

## 2013-06-24 ENCOUNTER — Other Ambulatory Visit: Payer: Self-pay | Admitting: *Deleted

## 2013-06-26 MED ORDER — METOPROLOL TARTRATE 25 MG PO TABS: 25.0000 mg | ORAL_TABLET | Freq: Two times a day (BID) | ORAL | Status: DC

## 2013-06-26 MED ORDER — FUROSEMIDE 20 MG PO TABS: 20.0000 mg | ORAL_TABLET | Freq: Every day | ORAL | Status: DC

## 2013-06-26 MED ORDER — DICLOFENAC SODIUM 1 % TD GEL: TRANSDERMAL | Status: DC

## 2013-06-27 ENCOUNTER — Ambulatory Visit (INDEPENDENT_AMBULATORY_CARE_PROVIDER_SITE_OTHER): Payer: Medicare Other | Admitting: Internal Medicine

## 2013-06-27 VITALS — BP 133/75 | HR 74 | Temp 98.0°F | Resp 20

## 2013-06-27 DIAGNOSIS — L8990 Pressure ulcer of unspecified site, unspecified stage: Secondary | ICD-10-CM

## 2013-06-27 DIAGNOSIS — Z23 Encounter for immunization: Secondary | ICD-10-CM

## 2013-06-27 DIAGNOSIS — Z0000 Encounter for general adult medical examination without abnormal findings: Secondary | ICD-10-CM

## 2013-06-27 DIAGNOSIS — L8992 Pressure ulcer of unspecified site, stage 2: Secondary | ICD-10-CM

## 2013-06-27 DIAGNOSIS — I6350 Cerebral infarction due to unspecified occlusion or stenosis of unspecified cerebral artery: Secondary | ICD-10-CM

## 2013-06-27 DIAGNOSIS — I639 Cerebral infarction, unspecified: Secondary | ICD-10-CM

## 2013-06-27 DIAGNOSIS — I519 Heart disease, unspecified: Secondary | ICD-10-CM

## 2013-06-27 DIAGNOSIS — I5189 Other ill-defined heart diseases: Secondary | ICD-10-CM

## 2013-06-27 NOTE — Progress Notes (Signed)
Patient ID: [name], female   DOB: 1924/11/01, 79 y.o.   MRN: [id number] Subjective:   Patient ID: [name] female   DOB: July 20, 1925 79 y.o.   MRN: [id number]  CC:  Follow up visit.   HPI:  [name] is a 79 y.o. lady with past medical history as outlined below, who presents for a followup visit today  1. Bilateral knee: She has chronic bilateral knee pain. She was seen by Dr. Margaretha Sheffield on 05/15/13. She received bilateral knee injection. Now her pain is tolerable. She is using Voltaren gel currently. She will see Dr. Margaretha Sheffield as needed.   2. dCHF: 2D ehco on 04/10/13 showed EF of 55 to 60% with mild LVH and grade 1 diastolic dysfunction. She is taking lasix 20 mg daily. Patient does not have chest pain, shortness of breath, palpitation. She feels like her legs are swelling, but that she does not have any leg edema on physical examination by myself. Her bp is normal today (133/75 mmHg).   3. Sacral Decubitus: She has a stage II sacral ulcers. She started using wheelchair since 01/2013. She has been using Eucerin in the past. Per her care giver ([facility]), her ulcer has not healed well. It is little worse. No fever or chills. Moderate pain over the ulcerative area.   ROS:  Denies fever, chills, headaches, cough, chest pain, SOB, abdominal pain, diarrhea, constipation, dysuria, urgency, frequency, hematuria. She has chronic knee and shoulder pain bilterally.    Past Medical History  Diagnosis Date  . Hypertension   . Head ache   . Renal insufficiency, mild   . Depression   . Fecal occult blood test positive   . Restless leg syndrome   . Anemia   . Cataracts, bilateral   . Hemorrhoids   . Rupture of rotator cuff of shoulder     s/p repair by Dr. Luiz Blare 7/07. Follows with Dr. Ave Filter for possible shoulder surgery.   . Lumbar spinal stenosis     gets Spinal injections by Dr. Callie Fielding   . Postnasal drip   . Atrial tachycardia     echo 04/08/11: EF 55-60%, mild LVH, grade 1 diast  dysfxn;    s/p RFCA with Dr. Ladona Ridgel  04/09/11  . Hyperkalemia 04/08/2011  . Unspecified deficiency anemia   . Osteoporosis, unspecified 04/09/2013    DEXA 5/07 : T L forearm -2.9. L femur -0.6, R femur -1.4. [name]  . CVA (cerebral infarction) 04/09/2013    MRI 2005 : Multiple tiny old lacunar infarcts as well as scattered white matter small vessel ischemic disease. Pt asymptomatic and denies TIA / CVA hx.    . Chronic pain syndrome 04/09/2013    On chronic opioids. Cervical MRI 11/ 2012 : Diffuse cervical spondylosis, Diffuse degenerative disc disease with multilevel foraminal stenosis. Possible T1 nerve root involvement. Lumbar MRI 2009 : prior fusion at L3-4 and degenerative disc disease Rotator cuff repair : Dr. Luiz Blare 7/07. Follows with Dr. Ave Filter for possible shoulder surgery.  H/O lumbar spinal stenosis with steroid injections. Mo  . Arthritis     b/l shoulder R>L  . Rotator cuff tear arthropathy     03/22/13 dx [facility] (Dr. Ave Filter) considering surgery   Current Outpatient Prescriptions  Medication Sig Dispense Refill  . aspirin 81 MG tablet Take 81 mg by mouth daily.        . carboxymethylcellulose (REFRESH PLUS) 0.5 % SOLN 1 drop 3 (three) times  daily as needed.      . diclofenac sodium (VOLTAREN) 1 % GEL Apply topically 4  times daily as needed for pain  100 g  3  . fentaNYL (DURAGESIC - DOSED MCG/HR) 12 MCG/HR Place 1 patch (12.5 mcg total) onto the skin every 3 (three) days.  10 patch  0  . ferrous sulfate 325 (65 FE) MG tablet Take 325 mg by mouth 2 (two) times daily.      . furosemide (LASIX) 20 MG tablet Take 1 tablet (20 mg total) by mouth daily.  30 tablet  1  . gabapentin (NEURONTIN) 100 MG capsule Take 1 capsule by mouth at  bedtime  60 capsule  2  . metoprolol tartrate (LOPRESSOR) 25 MG tablet Take 1 tablet (25 mg total) by mouth 2 (two) times daily.  60 tablet  3  . polyethylene glycol powder (MIRALAX) powder Take 17 g by mouth 2 (two) times daily  as needed. For constipation      . timolol (TIMOPTIC) 0.5 % ophthalmic solution Place 1 drop into the right eye daily.        No current facility-administered medications for this visit.   Family History  Problem Relation Age of Onset  . Coronary artery disease Neg Hx     premature   History   Social History  . Marital Status: Single    Spouse Name: N/A    Number of Children: 1  . Years of Education: N/A   Occupational History  .     Social History Main Topics  . Smoking status: Never Smoker   . Smokeless tobacco: Not on file  . Alcohol Use: No  . Drug Use: No  . Sexual Activity: Not on file   Other Topics Concern  . Not on file   Social History Narrative   Originally from [place]. She has been here in the [place] since the 60's.   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       Review of Systems: Full 14-point review of systems otherwise negative. See HPI.   Objective:  Physical Exam: Filed Vitals:   06/27/13 0842  BP: 133/75  Pulse: 74  Temp: 98 F (36.7 C)  TempSrc: Oral  Resp: 20  SpO2: 99%    General: alert, well-developed, and cooperative to examination.  Head: normocephalic and atraumatic.  Eyes: vision grossly intact, pupils equal, pupils round, pupils reactive to light, no injection and anicteric.  Mouth: pharynx pink and moist, no erythema, and no exudates.  Neck: supple, full ROM, no thyromegaly, no JVD, and no carotid bruits.  Lungs: normal respiratory effort, CTAB Heart: normal rate, regular rhythm, no murmur, no gallop, and no rub.  Abdomen: soft, non-tender, normal bowel sounds, no distention, no guarding, no rebound tenderness, no hepatomegaly, and no splenomegaly.  Msk: no joint swelling, no joint warmth, and no redness over joints.  Pulses: 2+ DP/PT pulses bilaterally Extremities: No cyanosis, clubbing. No leg edema  bilaterally. Neurologic: alert & oriented X3, cranial nerves II-XII intact, strength normal in all extremities, sensation intact to light touch, and gait normal.  Skin:  Small area of stage II decubitus ulcer over sacral area. There are three small areas with shallow skin break over the right side of the sacral area, proximately 0.3 cm in size for each skin break. Mildly erythematous. No discharge.  No signs of infection.  Psych: Oriented X3, memory intact for recent and remote, normally interactive, good eye contact, not anxious appearing, and not depressed appearing.     Assessment & Plan:

## 2013-06-27 NOTE — Assessment & Plan Note (Signed)
Patient's 2-D echo on 04/10/13 showed EF of 55-60% with mild LVH and grade 1 diastolic dysfunction. Currently patient taking Lasix 20 mg daily. Her previously BMP showed normal potassium and creatinine level on 03/25/13. Currently patient does not have shortness of breath, chest pain, palpitation. She does not have any leg edema. No JVD. Lung auscultation is clear laterally.  -will continue current regimen Lasix 20 mg daily, metoprolol 25 mg bid, and aspirin 81 mg daily to

## 2013-06-27 NOTE — Assessment & Plan Note (Addendum)
Patient has stage II decubitus ulcer over sacral area. There are three small areas with shallow skin break over the right side of the sacral area. No discharge. No signs of infection. patient was using Eucerin without significant improvement. She has [facility], 3 hour/day and 7 days/week.   -Will switch to hydrocolloid dressing -Patient and [facility] were instructed to not let the ulcer over wet. -will follow up in about one week.

## 2013-06-27 NOTE — Patient Instructions (Signed)
1. Please use hydrocolloid dressing from now. Please do let the lesion be over-wet.  2. Please take all medications as prescribed.  3. If you have worsening of your symptoms or new symptoms arise, please call the clinic ([phone]), or go to the ER immediately if symptoms are severe.  You have done great job in taking all your medications. I appreciate it very much. Please continue doing that.

## 2013-06-27 NOTE — Assessment & Plan Note (Signed)
-  will flu shot today.

## 2013-06-27 NOTE — Progress Notes (Signed)
Case discussed with Dr. Niu at the time of the visit.  We reviewed the resident's history and exam and pertinent patient test results.  I agree with the assessment, diagnosis, and plan of care documented in the resident's note.    

## 2013-06-27 NOTE — Assessment & Plan Note (Signed)
It is stable. Currently patient is taking aspirin 81 mg daily. She does not have new episode of weakness, numbness, or decreased sensations in her extremities. Will continue current regimen.

## 2013-06-28 ENCOUNTER — Other Ambulatory Visit: Payer: Self-pay | Admitting: Internal Medicine

## 2013-06-30 ENCOUNTER — Other Ambulatory Visit: Payer: Self-pay | Admitting: Internal Medicine

## 2013-07-02 ENCOUNTER — Other Ambulatory Visit: Payer: Self-pay | Admitting: Neurosurgery

## 2013-07-02 ENCOUNTER — Telehealth: Payer: Self-pay | Admitting: Hematology and Oncology

## 2013-07-02 DIAGNOSIS — M48062 Spinal stenosis, lumbar region with neurogenic claudication: Secondary | ICD-10-CM

## 2013-07-02 NOTE — Telephone Encounter (Signed)
[facility]ursing home called and change all appts for lab and MD up all the way to 2015

## 2013-07-04 ENCOUNTER — Other Ambulatory Visit: Payer: Self-pay | Admitting: *Deleted

## 2013-07-04 MED ORDER — DICLOFENAC SODIUM 1 % TD GEL: TRANSDERMAL | Status: DC

## 2013-07-04 NOTE — Telephone Encounter (Signed)
Fax from [facility] - needs verification of directions, missing amount of grams used per day.  Thanks

## 2013-07-05 ENCOUNTER — Encounter: Payer: Self-pay | Admitting: Internal Medicine

## 2013-07-05 ENCOUNTER — Other Ambulatory Visit: Payer: Self-pay | Admitting: Internal Medicine

## 2013-07-05 ENCOUNTER — Ambulatory Visit (INDEPENDENT_AMBULATORY_CARE_PROVIDER_SITE_OTHER): Payer: Medicare Other | Admitting: Internal Medicine

## 2013-07-05 VITALS — BP 119/74 | HR 62 | Temp 97.3°F | Ht 62.0 in | Wt 122.8 lb

## 2013-07-05 DIAGNOSIS — L8990 Pressure ulcer of unspecified site, unspecified stage: Secondary | ICD-10-CM

## 2013-07-05 DIAGNOSIS — L8992 Pressure ulcer of unspecified site, stage 2: Secondary | ICD-10-CM

## 2013-07-05 MED ORDER — HYDROCODONE-ACETAMINOPHEN 5-325 MG PO TABS: 1.0000 | ORAL_TABLET | Freq: Three times a day (TID) | ORAL | Status: DC | PRN

## 2013-07-05 MED ORDER — "CVS HYDROCOLLOID 2""X2"" EX PADS": 1.0000 | MEDICATED_PAD | CUTANEOUS | Status: DC

## 2013-07-05 MED ORDER — DICLOFENAC SODIUM 1 % TD GEL: TRANSDERMAL | Status: DC

## 2013-07-05 NOTE — Telephone Encounter (Signed)
Per fax, directions need to include amount of grams used per day.  Thanks

## 2013-07-05 NOTE — Progress Notes (Signed)
Patient ID: [name], female   DOB: 06/19/1925, 79 y.o.   MRN: [id number] Subjective:   Patient ID: [name] female   DOB: 1925/04/04 79 y.o.   MRN: [id number]  CC:   Follow up visit for decubitus ulcer.  HPI:  [name] is a 79 y.o. lady with past medical history as outlined below, who presents for a followup visit today.  She started using wheelchair since 01/2013. Patient developed stage II sacral decubitus ulcers. She was seen by me in clinic on 9/18. She has been using Eucerin in the past. Per her care giver ([facility]), her ulcer has not healed well. It has been progressively getting worse. When I saw patient, she had three small areas with shallow skin break over the right side of the sacral area. No discharge. No signs of infection. No fever or chills. Moderate pain over the ulcerative area. We switched her to hydrocolloid dressing. Patient and Home aid were instructed to not let the ulcer over wet. She was asked to come back for reevaluation in one week.  Today patient reports that her decubitus ulcer improved significantly. She still has pain over the same area, but the skin break improved significantly. She does not have any fever or chills. Her care giver reports that there is little difficulty to maintain the hydrocolloid dressing in place.   ROS:  Denies fever, chills, headaches, cough, chest pain, SOB, abdominal pain, diarrhea, constipation, dysuria, urgency, frequency, hematuria. She has chronic knee and shoulder pain bilaterally.    Past Medical History  Diagnosis Date  . Hypertension   . Head ache   . Renal insufficiency, mild   . Depression   . Fecal occult blood test positive   . Restless leg syndrome   . Anemia   . Cataracts, bilateral   . Hemorrhoids   . Rupture of rotator cuff of shoulder     s/p repair by Dr. Luiz Blare 7/07. Follows with Dr. Ave Filter for possible shoulder surgery.   . Lumbar spinal stenosis     gets Spinal injections by Dr. Callie Fielding    . Postnasal drip   . Atrial tachycardia     echo 04/08/11: EF 55-60%, mild LVH, grade 1 diast dysfxn;    s/p RFCA with Dr. Ladona Ridgel  04/09/11  . Hyperkalemia 04/08/2011  . Unspecified deficiency anemia   . Osteoporosis, unspecified 04/09/2013    DEXA 5/07 : T L forearm -2.9. L femur -0.6, R femur -1.4. [name]  . CVA (cerebral infarction) 04/09/2013    MRI 2005 : Multiple tiny old lacunar infarcts as well as scattered white matter small vessel ischemic disease. Pt asymptomatic and denies TIA / CVA hx.    . Chronic pain syndrome 04/09/2013    On chronic opioids. Cervical MRI 11/ 2012 : Diffuse cervical spondylosis, Diffuse degenerative disc disease with multilevel foraminal stenosis. Possible T1 nerve root involvement. Lumbar MRI 2009 : prior fusion at L3-4 and degenerative disc disease Rotator cuff repair : Dr. Luiz Blare 7/07. Follows with Dr. Ave Filter for possible shoulder surgery.  H/O lumbar spinal stenosis with steroid injections. Mo  . Arthritis     b/l shoulder R>L  . Rotator cuff tear arthropathy     03/22/13 dx [facility] (Dr. Ave Filter) considering surgery   Current Outpatient Prescriptions  Medication Sig Dispense Refill  . aspirin 81 MG tablet Take 81 mg by mouth daily.        . carboxymethylcellulose (REFRESH PLUS) 0.5 %  SOLN 1 drop 3 (three) times daily as needed.      . diclofenac sodium (VOLTAREN) 1 % GEL Apply topically 4  times daily as needed for pain  100 g  3  . fentaNYL (DURAGESIC - DOSED MCG/HR) 12 MCG/HR Place 1 patch (12.5 mcg total) onto the skin every 3 (three) days.  10 patch  0  . ferrous sulfate 325 (65 FE) MG tablet Take 325 mg by mouth 2 (two) times daily.      . furosemide (LASIX) 20 MG tablet Take 1 tablet (20 mg total) by mouth daily.  30 tablet  1  . gabapentin (NEURONTIN) 100 MG capsule Take 1 capsule by mouth at  bedtime  60 capsule  2  . metoprolol tartrate (LOPRESSOR) 25 MG tablet Take 1 tablet (25 mg total) by mouth 2 (two) times daily.  60  tablet  3  . polyethylene glycol powder (MIRALAX) powder Take 17 g by mouth 2 (two) times daily as needed. For constipation      . timolol (TIMOPTIC) 0.5 % ophthalmic solution Place 1 drop into the right eye daily.        No current facility-administered medications for this visit.   Family History  Problem Relation Age of Onset  . Coronary artery disease Neg Hx     premature   History   Social History  . Marital Status: Single    Spouse Name: N/A    Number of Children: 1  . Years of Education: N/A   Occupational History  .     Social History Main Topics  . Smoking status: Never Smoker   . Smokeless tobacco: None  . Alcohol Use: No  . Drug Use: No  . Sexual Activity: None   Other Topics Concern  . None   Social History Narrative   Originally from [place]. She has been here in the [place] since the 60's.   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       Review of Systems: Full 14-point review of systems otherwise negative. See HPI.   Objective:  Physical Exam: Filed Vitals:   07/05/13 1429  BP: 119/74  Pulse: 62  Temp: 97.3 F (36.3 C)  TempSrc: Oral  Height: 5\' 2"  (1.575 m)  Weight: 122 lb 12.8 oz (55.702 kg)  SpO2: 99%   General: alert, well-developed, and cooperative to examination.  Head: normocephalic and atraumatic.  Eyes: vision grossly intact, pupils equal, pupils round, pupils reactive to light, no injection and anicteric.  Mouth: pharynx pink and moist, no erythema, and no exudates.  Neck: supple, full ROM, no thyromegaly, no JVD, and no carotid bruits.  Lungs: normal respiratory effort, CTAB Heart: normal rate, regular rhythm, no murmur, no gallop, and no rub.  Abdomen: soft, non-tender, normal bowel sounds, no distention, no guarding, no rebound tenderness, no hepatomegaly, and no splenomegaly.  Msk: no joint swelling, no joint warmth, and no  redness over joints.  Pulses: 2+ DP/PT pulses bilaterally Extremities: No cyanosis, clubbing. No leg edema bilaterally. Neurologic: alert & oriented X3, cranial nerves II-XII intact, strength normal in all extremities, sensation intact to light touch, and gait normal.  Skin:  There is only one small area with shallow skin break over the right side of the sacral area.  Mildly erythematous. No discharge. No signs of infection.  Psych: Oriented  X3, memory intact for recent and remote, normally interactive, good eye contact, not anxious appearing, and not depressed appearing.    Assessment & Plan:

## 2013-07-05 NOTE — Patient Instructions (Signed)
1. Please continue to use the Hydrocolloid dressing and tap it on the skin. 2. Please take all medications as prescribed.  3. If you have worsening of your symptoms or new symptoms arise, please call the clinic ([phone]), or go to the ER immediately if symptoms are severe.  You have done great job in taking all your medications. I appreciate it very much. Please continue doing that.

## 2013-07-05 NOTE — Assessment & Plan Note (Signed)
Her decubitus ulcer improved significantly. She used to have 3 areas of skin breakdown, currently she only has one area with skin breakdown. It is healing well and locally dry. She does not have fever or chills. Her caregiver reports that there is a little difficulty to maintain the dressing in place.  -will continue hydrocolloid dressing -Will treat pain with pain medication -Patient was advised to use Donut pad to maintain the dressing in place.

## 2013-07-08 NOTE — Progress Notes (Signed)
Case discussed with Dr. Niu at the time of the visit.  We reviewed the resident's history and exam and pertinent patient test results.  I agree with the assessment, diagnosis, and plan of care documented in the resident's note.    

## 2013-07-08 NOTE — Telephone Encounter (Signed)
See encounter 9/25 per Dr Shirlee Latch.

## 2013-07-09 ENCOUNTER — Other Ambulatory Visit: Payer: Medicare Other

## 2013-07-09 NOTE — Telephone Encounter (Signed)
#   grams was clarified by Dr Shirlee Latch and the pharmacy.

## 2013-07-10 ENCOUNTER — Ambulatory Visit
Admission: RE | Admit: 2013-07-10 | Discharge: 2013-07-10 | Disposition: A | Discharge status: Home / Self Care | Payer: Medicare Other | Source: Ambulatory Visit | Attending: Neurosurgery | Admitting: Neurosurgery

## 2013-07-10 DIAGNOSIS — M48062 Spinal stenosis, lumbar region with neurogenic claudication: Secondary | ICD-10-CM

## 2013-07-11 ENCOUNTER — Other Ambulatory Visit: Payer: Self-pay | Admitting: Neurosurgery

## 2013-07-11 DIAGNOSIS — M5104 Intervertebral disc disorders with myelopathy, thoracic region: Secondary | ICD-10-CM

## 2013-07-16 ENCOUNTER — Encounter: Payer: Medicare Other | Admitting: Internal Medicine

## 2013-07-17 ENCOUNTER — Other Ambulatory Visit: Payer: Self-pay | Admitting: Hematology and Oncology

## 2013-07-17 DIAGNOSIS — D649 Anemia, unspecified: Secondary | ICD-10-CM

## 2013-07-18 ENCOUNTER — Other Ambulatory Visit (HOSPITAL_BASED_OUTPATIENT_CLINIC_OR_DEPARTMENT_OTHER): Payer: Medicare Other | Admitting: Lab

## 2013-07-18 DIAGNOSIS — D509 Iron deficiency anemia, unspecified: Secondary | ICD-10-CM

## 2013-07-18 DIAGNOSIS — D649 Anemia, unspecified: Secondary | ICD-10-CM

## 2013-07-18 LAB — BASIC METABOLIC PANEL (CC13)
Anion Gap: 8 mEq/L (ref 3–11)
BUN: 13.3 mg/dL (ref 7.0–26.0)
CO2: 26 mEq/L (ref 22–29)
Calcium: 8.5 mg/dL (ref 8.4–10.4)
Chloride: 110 mEq/L — ABNORMAL HIGH (ref 98–109)
Glucose: 74 mg/dl (ref 70–140)
Potassium: 3.5 mEq/L (ref 3.5–5.1)
Sodium: 144 mEq/L (ref 136–145)

## 2013-07-18 LAB — CBC & DIFF AND RETIC
BASO%: 0.2 % (ref 0.0–2.0)
EOS%: 1.1 % (ref 0.0–7.0)
Eosinophils Absolute: 0.1 10*3/uL (ref 0.0–0.5)
HCT: 38.3 % (ref 34.8–46.6)
HGB: 12.8 g/dL (ref 11.6–15.9)
Immature Retic Fract: 2.3 % (ref 1.60–10.00)
LYMPH%: 47 % (ref 14.0–49.7)
MCH: 31.3 pg (ref 25.1–34.0)
MCHC: 33.4 g/dL (ref 31.5–36.0)
MCV: 93.6 fL (ref 79.5–101.0)
MONO#: 0.7 10*3/uL (ref 0.1–0.9)
NEUT#: 2.7 10*3/uL (ref 1.5–6.5)
RDW: 14.7 % — ABNORMAL HIGH (ref 11.2–14.5)
Retic %: 1.15 % (ref 0.70–2.10)
Retic Ct Abs: 47.04 10*3/uL (ref 33.70–90.70)
WBC: 6.4 10*3/uL (ref 3.9–10.3)
lymph#: 3 10*3/uL (ref 0.9–3.3)

## 2013-07-23 ENCOUNTER — Ambulatory Visit
Admission: RE | Admit: 2013-07-23 | Discharge: 2013-07-23 | Disposition: A | Discharge status: Home / Self Care | Payer: Medicare Other | Source: Ambulatory Visit | Attending: Neurosurgery | Admitting: Neurosurgery

## 2013-07-23 DIAGNOSIS — M5104 Intervertebral disc disorders with myelopathy, thoracic region: Secondary | ICD-10-CM

## 2013-08-01 ENCOUNTER — Ambulatory Visit (INDEPENDENT_AMBULATORY_CARE_PROVIDER_SITE_OTHER): Payer: Medicare Other | Admitting: Internal Medicine

## 2013-08-01 ENCOUNTER — Encounter: Payer: Self-pay | Admitting: Internal Medicine

## 2013-08-01 VITALS — BP 113/67 | HR 75 | Temp 99.1°F

## 2013-08-01 DIAGNOSIS — L8992 Pressure ulcer of unspecified site, stage 2: Secondary | ICD-10-CM

## 2013-08-01 DIAGNOSIS — I10 Essential (primary) hypertension: Secondary | ICD-10-CM

## 2013-08-01 DIAGNOSIS — Z9181 History of falling: Secondary | ICD-10-CM

## 2013-08-01 DIAGNOSIS — G959 Disease of spinal cord, unspecified: Secondary | ICD-10-CM

## 2013-08-01 DIAGNOSIS — L8990 Pressure ulcer of unspecified site, unspecified stage: Secondary | ICD-10-CM

## 2013-08-01 DIAGNOSIS — G894 Chronic pain syndrome: Secondary | ICD-10-CM

## 2013-08-01 DIAGNOSIS — G9520 Unspecified cord compression: Secondary | ICD-10-CM | POA: Insufficient documentation

## 2013-08-01 MED ORDER — HYDROCODONE-ACETAMINOPHEN 5-325 MG PO TABS: 1.0000 | ORAL_TABLET | Freq: Two times a day (BID) | ORAL | Status: DC | PRN

## 2013-08-01 NOTE — Assessment & Plan Note (Signed)
BP Readings from Last 3 Encounters:  08/01/13 113/67  07/05/13 119/74  06/27/13 133/75    Lab Results  Component Value Date   NA 144 07/18/2013   K 3.5 07/18/2013   CREATININE 0.8 07/18/2013    Assessment: Blood pressure control: controlled Progress toward BP goal:  at goal Comments: none  Plan: Medications:  continue current medications (Lasix, Metoprolol, Aspirin)  Educational resources provided: brochure;handout Self management tools provided: home blood pressure logbook Other plans: recent labs wnl. Reassess at f/u

## 2013-08-01 NOTE — Assessment & Plan Note (Signed)
Fall Screening 03/25/2013 04/22/2013 06/27/2013 07/05/2013 08/01/2013  Falls in the past year? Yes No Yes Yes Yes  Number of falls in past year 2 or more - 2 or more 2 or more 2 or more  Was there an injury with Fall? - - - - -  Risk Factor Category  - - High Fall Risk High Fall Risk High Fall Risk      Assessment: Progress toward fall prevention goal:  deteriorated Comments: pt had decline in falls due to back pathology limiting ADLS and mobility   Plan: Fall prevention plans: given educational information  Educational resources provided: brochure;handout Self management tools provided: handout  Will likely need PT/OT in the future hopefully they can assess pt after back surgery

## 2013-08-01 NOTE — Assessment & Plan Note (Addendum)
Given Mediplex and hydrocolloid samples to use on sacral decubitus 1.5 cm wound Disc. Position changes while lying q2-4 hours.   She may need wound care to look at the wound while hospitalized for back surgery

## 2013-08-01 NOTE — Progress Notes (Signed)
Subjective:    Patient ID: [name], female    DOB: [birth year], 79 y.o.   MRN: [id number]  HPI Comments: 79 y.o PMH stroke, HTN (BP 113/67), osteoporosis, allergic rhinitis, Fe def anemia, atrial tachycardia, b/l leg edema, celiac disease, chronic pain (back, shoulder), depression, h/o diastolic grade 1 dysfunction, GERD, RLS. SOB, sacral decubitus ulcer, cataract.    She presents for f/u for sacral decubitus wound and also decline in her mobility/ADLs for the past 2-3 months due to back pathology noted on MRI.  Pain level is 10/10 per patient.  Her RN aid Serenity who takes care of her at least 3 hours a day and prn x 7 days a week states her legs have deteriorated for the last 2-3 months.  Now she is unable to walk, use her legs, get up out of bed, stand.  She is not having urinary incontinence but is unable to make it from her bed to the bathroom in time.  She is also having numbness and tingling in her lower legs.  She has been following with Dr. Abigail Miyamoto and had at least 2-3 epidural injections w/in the last year for back pain.  The Fentanyl patches are not providing relief for the back pain and she request Vicodin which she is taking bid prn for pain.  This helps with the pain due to her sacral decubitis ulcer more so than back, leg pain. The patient is having a non invasive surgery with Dr. Jordan Likes ([facility]) 08/06/13 to help MRI thoracic pathology 07/23/13 Large soft disk extrusion at T11-12 severely compressing the spinal cord with secondary myelopathy. Moderate compression of the spinal cord at T10-11 due to a broad-based disc protrusion. Broad-based disk protrusion at T12-L1 asymmetric to the left with compression of the thecal sac and left lateral recess.  MRI lumbar pathology New large disc extrusion at T11-12, only visualized in thesagittal plane. This compresses the distal thoracic cord. Further evaluation with dedicated thoracic MRI recommended. The findings in the lumbar spine have not  significantly changed from the 2009 MRI aside from mild progression in spondylosis at multiple levels.  Patient had normal labs noted 07/18/13.    They are using the colloid dressing to the sacral decubitus area but it does not stick and 4 dressings cost $12 which is a lot of money for the patient   The RN aid states that the patient will benefit from an extended shower chair.  The RN aid is requesting help getting paid for her time dedicated to take care of the patient which is more than her scheduled hours of pay.    The RN aid states the patient needs an extended shower bench/chair/device which is not covered by her insurance.       Review of Systems  Respiratory: Negative for shortness of breath.   Cardiovascular: Negative for chest pain.  Genitourinary:       Denies urinary/stool incontinence.   Neurological: Positive for weakness and numbness.       Leg weakness        Objective:   Physical Exam  Nursing note and vitals reviewed. Constitutional: She is oriented to person, place, and time. Vital signs are normal. She appears well-developed and well-nourished. She is cooperative. No distress.  HENT:  Head: Normocephalic and atraumatic.  Mouth/Throat: Oropharynx is clear and moist and mucous membranes are normal. She has dentures. No oropharyngeal exudate.  Eyes: Conjunctivae are normal. Pupils are equal, round, and reactive to light. Right eye exhibits no discharge.  Left eye exhibits no discharge. No scleral icterus.  Cardiovascular: Normal rate, regular rhythm, S1 normal, S2 normal and normal heart sounds.   No murmur heard. 1+ lower ext edema Diminished PT and DP pulses  Cool lower ext   Pulmonary/Chest: Effort normal and breath sounds normal. No respiratory distress. She has no wheezes.  Abdominal: Soft. Bowel sounds are normal. There is no tenderness.  Neurological: She is alert and oriented to person, place, and time.  In wheelchair-gait not assessed Strength decreased  in lower legs. Pt is able to plantar and dorsiflex but otherwise lower ext strength is 1/5 b/l 4+ strength in b/l arms   Skin: Skin is warm and dry. Abrasion noted. No rash noted. She is not diaphoretic.  Abrasions to lower extremities b/l post fall   Psychiatric: She has a normal mood and affect. Her speech is normal and behavior is normal. Judgment and thought content normal. Cognition and memory are normal.          Assessment & Plan:  F/u after Neurosurgery for spinal cord decompression with Dr. Jordan Likes

## 2013-08-01 NOTE — Patient Instructions (Addendum)
General Instructions:  Please follow up after surgery when you can with Dr. Shirlee Latch  Read all the information below   Treatment Goals:  Goals (1 Years of Data) as of 08/01/13         As of Today 07/05/13 06/27/13 05/15/13 04/25/13     Blood Pressure    . Blood Pressure < 140/90  113/67 119/74 133/75 123/67 135/55      Progress Toward Treatment Goals:  Treatment Goal 08/01/2013  Blood pressure at goal  Prevent falls deteriorated    Self Care Goals & Plans:  Self Care Goal 08/01/2013  Manage my medications take my medicines as prescribed; bring my medications to every visit; refill my medications on time  Monitor my health -  Eat healthy foods drink diet soda or water instead of juice or soda; eat more vegetables; eat foods that are low in salt; eat baked foods instead of fried foods; eat fruit for snacks and desserts; eat smaller portions  Be physically active -  Prevent falls -  Other -  Meeting treatment goals maintain the current self-care plan    No flowsheet data found.   Care Management & Community Referrals:  Referral 08/01/2013  Referrals made for care management support social worker  Referrals made to community resources none        Pressure Ulcer A pressure ulcer is a sore where the skin breaks down and exposes deeper layers of tissue. It develops in areas of the body where there is unrelieved pressure. Pressure ulcers are usually found over a boney area, such as the shoulder blades, spine, lower back, hips, knees, ankles, and heels. CAUSES   Decreased ability to move.  Decreased ability to feel pain or discomfort.  Moisture from urine or stool.  Poor nutrition.  Pulling sheets that are under a patient when changing his or her position. HOME CARE INSTRUCTIONS  Follow the care plan that was started in the hospital.  Avoid staying in the same position for more than 2 hours. Use padding, devices, or mattresses to cushion your pressure points as  directed by your caregiver.  Eat well. Take nutritional supplements and vitamins as directed by your caregiver.  Keep all follow-up appointments.  Take pain medicine as directed by your caregiver. SEEK MEDICAL CARE IF:   Your pressure ulcer is not improving.  You do not know how to care for your pressure ulcer.  You notice other areas of redness on your skin. SEEK IMMEDIATE MEDICAL CARE IF:   You have increasing redness, swelling, or pain in your pressure ulcer.  You notice pus, or increased pus, coming from your pressure ulcer.  You have a fever.  You notice a bad smell coming from the wound or dressing.  Your pressure ulcer opens up again. MAKE SURE YOU:   Understand these instructions.  Will watch your condition.  Will get help right away if you are not doing well or get worse. Document Released: 09/26/2005 Document Revised: 12/19/2011 Document Reviewed: 05/13/2011 [facility] Patient Information 2014 [place], [place].  Back Pain, Adult Low back pain is very common. About 1 in 5 people have back pain.The cause of low back pain is rarely dangerous. The pain often gets better over time.About half of people with a sudden onset of back pain feel better in just 2 weeks. About 8 in 10 people feel better by 6 weeks.  CAUSES Some common causes of back pain include:  Strain of the muscles or ligaments supporting the spine.  Wear  and tear (degeneration) of the spinal discs.  Arthritis.  Direct injury to the back. DIAGNOSIS Most of the time, the direct cause of low back pain is not known.However, back pain can be treated effectively even when the exact cause of the pain is unknown.Answering your caregiver's questions about your overall health and symptoms is one of the most accurate ways to make sure the cause of your pain is not dangerous. If your caregiver needs more information, he or she may order lab work or imaging tests (X-rays or MRIs).However, even if imaging tests  show changes in your back, this usually does not require surgery. HOME CARE INSTRUCTIONS For many people, back pain returns.Since low back pain is rarely dangerous, it is often a condition that people can learn to [facility] their own.   Remain active. It is stressful on the back to sit or stand in one place. Do not sit, drive, or stand in one place for more than 30 minutes at a time. Take short walks on level surfaces as soon as pain allows.Try to increase the length of time you walk each day.  Do not stay in bed.Resting more than 1 or 2 days can delay your recovery.  Do not avoid exercise or work.Your body is made to move.It is not dangerous to be active, even though your back may hurt.Your back will likely heal faster if you return to being active before your pain is gone.  Pay attention to your body when you bend and lift. Many people have less discomfortwhen lifting if they bend their knees, keep the load close to their bodies,and avoid twisting. Often, the most comfortable positions are those that put less stress on your recovering back.  Find a comfortable position to sleep. Use a firm mattress and lie on your side with your knees slightly bent. If you lie on your back, put a pillow under your knees.  Only take over-the-counter or prescription medicines as directed by your caregiver. Over-the-counter medicines to reduce pain and inflammation are often the most helpful.Your caregiver may prescribe muscle relaxant drugs.These medicines help dull your pain so you can more quickly return to your normal activities and healthy exercise.  Put ice on the injured area.  Put ice in a plastic bag.  Place a towel between your skin and the bag.  Leave the ice on for 15-20 minutes, 3-4 times a day for the first 2 to 3 days. After that, ice and heat may be alternated to reduce pain and spasms.  Ask your caregiver about trying back exercises and gentle massage. This may be of some  benefit.  Avoid feeling anxious or stressed.Stress increases muscle tension and can worsen back pain.It is important to recognize when you are anxious or stressed and learn ways to manage it.Exercise is a great option. SEEK MEDICAL CARE IF:  You have pain that is not relieved with rest or medicine.  You have pain that does not improve in 1 week.  You have new symptoms.  You are generally not feeling well. SEEK IMMEDIATE MEDICAL CARE IF:   You have pain that radiates from your back into your legs.  You develop new bowel or bladder control problems.  You have unusual weakness or numbness in your arms or legs.  You develop nausea or vomiting.  You develop abdominal pain.  You feel faint. Document Released: 09/26/2005 Document Revised: 03/27/2012 Document Reviewed: 02/14/2011 [facility] Patient Information 2014 [place], [place].  Fall Prevention and Home Safety Falls cause injuries and  can affect all age groups. It is possible to use preventive measures to significantly decrease the likelihood of falls. There are many simple measures which can make your home safer and prevent falls. OUTDOORS  Repair cracks and edges of walkways and driveways.  Remove high doorway thresholds.  Trim shrubbery on the main path into your home.  Have good outside lighting.  Clear walkways of tools, rocks, debris, and clutter.  Check that handrails are not broken and are securely fastened. Both sides of steps should have handrails.  Have leaves, snow, and ice cleared regularly.  Use sand or salt on walkways during winter months.  In the garage, clean up grease or oil spills. BATHROOM  Install night lights.  Install grab bars by the toilet and in the tub and shower.  Use non-skid mats or decals in the tub or shower.  Place a plastic non-slip stool in the shower to sit on, if needed.  Keep floors dry and clean up all water on the floor immediately.  Remove soap buildup in the tub or  shower on a regular basis.  Secure bath mats with non-slip, double-sided rug tape.  Remove throw rugs and tripping hazards from the floors. BEDROOMS  Install night lights.  Make sure a bedside light is easy to reach.  Do not use oversized bedding.  Keep a telephone by your bedside.  Have a firm chair with side arms to use for getting dressed.  Remove throw rugs and tripping hazards from the floor. KITCHEN  Keep handles on pots and pans turned toward the center of the stove. Use back burners when possible.  Clean up spills quickly and allow time for drying.  Avoid walking on wet floors.  Avoid hot utensils and knives.  Position shelves so they are not too high or low.  Place commonly used objects within easy reach.  If necessary, use a sturdy step stool with a grab bar when reaching.  Keep electrical cables out of the way.  Do not use floor polish or wax that makes floors slippery. If you must use wax, use non-skid floor wax.  Remove throw rugs and tripping hazards from the floor. STAIRWAYS  Never leave objects on stairs.  Place handrails on both sides of stairways and use them. Fix any loose handrails. Make sure handrails on both sides of the stairways are as long as the stairs.  Check carpeting to make sure it is firmly attached along stairs. Make repairs to worn or loose carpet promptly.  Avoid placing throw rugs at the top or bottom of stairways, or properly secure the rug with carpet tape to prevent slippage. Get rid of throw rugs, if possible.  Have an electrician put in a light switch at the top and bottom of the stairs. OTHER FALL PREVENTION TIPS  Wear low-heel or rubber-soled shoes that are supportive and fit well. Wear closed toe shoes.  When using a stepladder, make sure it is fully opened and both spreaders are firmly locked. Do not climb a closed stepladder.  Add color or contrast paint or tape to grab bars and handrails in your home. Place  contrasting color strips on first and last steps.  Learn and use mobility aids as needed. Install an electrical emergency response system.  Turn on lights to avoid dark areas. Replace light bulbs that burn out immediately. Get light switches that glow.  Arrange furniture to create clear pathways. Keep furniture in the same place.  Firmly attach carpet with non-skid or double-sided  tape.  Eliminate uneven floor surfaces.  Select a carpet pattern that does not visually hide the edge of steps.  Be aware of all pets. OTHER HOME SAFETY TIPS  Set the water temperature for 120 F (48.8 C).  Keep emergency numbers on or near the telephone.  Keep smoke detectors on every level of the home and near sleeping areas. Document Released: 09/16/2002 Document Revised: 03/27/2012 Document Reviewed: 12/16/2011 [facility] Patient Information 2014 [place], [place].  Fall Prevention, Elderly Falls are the leading cause of injuries, accidents, and accidental deaths in people over the age of 51. Falling is a real threat to your ability to live on your own. CAUSES   Poor eyesight or poor hearing can make you more likely to fall.   Illnesses and physical conditions can affect your strength and balance.   Poor lighting, throw rugs and pets in your home can make you more likely to trip or slip.   The side effects of some medicines can upset your balance and lead to falling. These include medicines for depression, sleep problems, high blood pressure, diabetes, and heart conditions.  PREVENTION  Be sure your home is as safe as possible. Here are some tips:  Wear shoes with non-skid soles (not house slippers).   Be sure your home and outside area are well lit.   Use night lights throughout your house, including hallways and stairways.   Remove clutter and clean up spills on floors and walkways.   Remove throw rugs or fasten them to the floor with carpet tape. Tack down carpet edges.   Do not place  electrical cords across pathways.   Install grab bars in your bathtub, shower, and toilet area. Towel bars should not be used as a grab bar.   Install handrails on both sides of stairways.   Do not climb on stools or stepladders. Get someone else to help with jobs that require climbing.   Do not wax your floors at all, or use a non-skid wax.   Repair uneven or unsafe sidewalks, walkways or stairs.   Keep frequently used items within reach.   Be aware of pets so you do not trip.  Get regular check-ups from your doctor, and take good care of yourself:  Have your eyes checked every year for vision changes, cataracts, glaucoma, and other eye problems. Wear eyeglasses as directed.   Have your hearing checked every 2 years, or anytime you or others think that you cannot hear well. Use hearing aids as directed.   See your caregiver if you have foot pain or corns. Sore feet can contribute to falls.   Let your caregiver know if a medicine is making you feel dizzy or making you lose your balance.   Use a cane, walker, or wheelchair as directed. Use walker or wheelchair brakes when getting in and out.   When you get up from bed, sit on the side of the bed for 1 to 2 minutes before you stand up. This will give your blood pressure time to adjust, and you will feel less dizzy.   If you need to go to the bathroom often, consider using a bedside commode.  Keep your body in good shape:  Get regular exercise, especially walking.   Do exercises to strengthen the muscles you use for walking and lifting.   Do not smoke.   Minimize use of alcohol.  SEEK IMMEDIATE MEDICAL CARE IF:   You feel dizzy, weak, or unsteady on your feet.  You feel confused.   You fall.  Document Released: 09/26/2005 Document Revised: 09/15/2011 Document Reviewed: 03/23/2007 [facility] Patient Information 2012 [place], [place]. Hypertension As your heart beats, it forces blood through your arteries. This force is your  blood pressure. If the pressure is too high, it is called hypertension (HTN) or high blood pressure. HTN is dangerous because you may have it and not know it. High blood pressure may mean that your heart has to work harder to pump blood. Your arteries may be narrow or stiff. The extra work puts you at risk for heart disease, stroke, and other problems.  Blood pressure consists of two numbers, a higher number over a lower, 110/72, for example. It is stated as "110 over 72." The ideal is below 120 for the top number (systolic) and under 80 for the bottom (diastolic). Write down your blood pressure today. You should pay close attention to your blood pressure if you have certain conditions such as:  Heart failure.  Prior heart attack.  Diabetes  Chronic kidney disease.  Prior stroke.  Multiple risk factors for heart disease. To see if you have HTN, your blood pressure should be measured while you are seated with your arm held at the level of the heart. It should be measured at least twice. A one-time elevated blood pressure reading (especially in the Emergency Department) does not mean that you need treatment. There may be conditions in which the blood pressure is different between your right and left arms. It is important to see your caregiver soon for a recheck. Most people have essential hypertension which means that there is not a specific cause. This type of high blood pressure may be lowered by changing lifestyle factors such as:  Stress.  Smoking.  Lack of exercise.  Excessive weight.  Drug/tobacco/alcohol use.  Eating less salt. Most people do not have symptoms from high blood pressure until it has caused damage to the body. Effective treatment can often prevent, delay or reduce that damage. TREATMENT  When a cause has been identified, treatment for high blood pressure is directed at the cause. There are a large number of medications to treat HTN. These fall into several  categories, and your caregiver will help you select the medicines that are best for you. Medications may have side effects. You should review side effects with your caregiver. If your blood pressure stays high after you have made lifestyle changes or started on medicines,   Your medication(s) may need to be changed.  Other problems may need to be addressed.  Be certain you understand your prescriptions, and know how and when to take your medicine.  Be sure to follow up with your caregiver within the time frame advised (usually within two weeks) to have your blood pressure rechecked and to review your medications.  If you are taking more than one medicine to lower your blood pressure, make sure you know how and at what times they should be taken. Taking two medicines at the same time can result in blood pressure that is too low. SEEK IMMEDIATE MEDICAL CARE IF:  You develop a severe headache, blurred or changing vision, or confusion.  You have unusual weakness or numbness, or a faint feeling.  You have severe chest or abdominal pain, vomiting, or breathing problems. MAKE SURE YOU:   Understand these instructions.  Will watch your condition.  Will get help right away if you are not doing well or get worse. Document Released: 09/26/2005 Document Revised: 12/19/2011 Document  Reviewed: 05/16/2008 [employer] Patient Information 2014 [place], [place].

## 2013-08-01 NOTE — Assessment & Plan Note (Addendum)
She is having neuro sx's with numbness/tingling, decreased strength.  She is having decreased ADLS.  These sx's have occurred for 2-3 months  At this time pt declines SNF Surgery scheduled with Dr. Jordan Likes 10/28/114 at 10 am Rx bedside commode.  PT will also need shower chair/bench/device to help her with bathing in the shower and insurance will not cover  Will refer to social work for assistance.   Will likely need PT/OT possibly short term SNF after surgery

## 2013-08-01 NOTE — Assessment & Plan Note (Signed)
Rx Norco/Vicodin 5-325 mg bid prn. No relieved with Fentanyl and really opiates not helping at this point  Pt has spinal surgery with Dr. Jordan Likes scheduled 08/06/13 at 10 am Cardiac risk index score of 1.  She is a surgical candidate with recent labs wnl, she has had a h/o stroke on Aspirin for secondary prevention but will stop Aspirin 5 days prior to surgery and ask Spine surgeon Dr. Jordan Likes when to resume Aspirin.

## 2013-08-02 ENCOUNTER — Telehealth: Payer: Self-pay | Admitting: Licensed Clinical Social Worker

## 2013-08-02 NOTE — Telephone Encounter (Signed)
CSW received call from Ms. Earlene Plater, pt's primary caregiver.  Caregiver attended pt's most recent [facility] appt with pt.  Caregiver states pt is scheduled for back surgery on 10/28, upon d/c from home pt will need 80+ hours of care.  Ms. Earlene Plater states her agency has submitted paperwork to the state but has yet to receive a response and pt's surgery is next week.  CSW informed caregiver, [facility] can request physician to complete request from 80+ hours and fax to [facility] but can not guarantee pt will receive the 80+ hours or hours will be available upon d/c to home.  CSW informed Ms. Earlene Plater, pt may benefit from d/c to SNF after surgery if needed.  Caregiver states pt declines SNF and would like to stay home.  Ms. Earlene Plater states pt has not used medicaid transportation yet this year and needs to be signed.  Caregiver requesting phone numbers, CSW provided Ms. Davis with the number to [facility] and [employer].  Informed caregiver, Bryson Dames will need at least 24 hrs to obtain approval from [facility] for medical transportation.  Caregiver also had questions as to location of surgery, EMR indicates [facility] campus.  CSW deferred caregiver back to surgeon for specifics.  CSW will initiate PCS request.

## 2013-08-05 ENCOUNTER — Encounter (HOSPITAL_COMMUNITY): Payer: Self-pay | Admitting: Pharmacy Technician

## 2013-08-05 ENCOUNTER — Encounter (HOSPITAL_COMMUNITY): Payer: Self-pay

## 2013-08-05 ENCOUNTER — Encounter (HOSPITAL_COMMUNITY)
Admission: RE | Admit: 2013-08-05 | Discharge: 2013-08-05 | Disposition: A | Discharge status: Home / Self Care | Payer: Medicare Other | Source: Ambulatory Visit | Attending: Anesthesiology | Admitting: Anesthesiology

## 2013-08-05 ENCOUNTER — Other Ambulatory Visit: Payer: Self-pay | Admitting: Neurosurgery

## 2013-08-05 ENCOUNTER — Encounter (HOSPITAL_COMMUNITY)
Admission: RE | Admit: 2013-08-05 | Discharge: 2013-08-05 | Disposition: A | Discharge status: Home / Self Care | Payer: Medicare Other | Source: Ambulatory Visit | Attending: Neurosurgery | Admitting: Neurosurgery

## 2013-08-05 DIAGNOSIS — Z01810 Encounter for preprocedural cardiovascular examination: Secondary | ICD-10-CM | POA: Insufficient documentation

## 2013-08-05 DIAGNOSIS — Z01818 Encounter for other preprocedural examination: Secondary | ICD-10-CM | POA: Insufficient documentation

## 2013-08-05 DIAGNOSIS — Z01812 Encounter for preprocedural laboratory examination: Secondary | ICD-10-CM | POA: Insufficient documentation

## 2013-08-05 HISTORY — DX: Personal history of other medical treatment: Z92.89

## 2013-08-05 HISTORY — DX: Cerebral infarction, unspecified: I63.9

## 2013-08-05 LAB — CBC WITH DIFFERENTIAL/PLATELET
Basophils Relative: 0 % (ref 0–1)
Eosinophils Absolute: 0.1 10*3/uL (ref 0.0–0.7)
Eosinophils Relative: 1 % (ref 0–5)
Hemoglobin: 14.5 g/dL (ref 12.0–15.0)
Lymphs Abs: 2.8 10*3/uL (ref 0.7–4.0)
MCH: 33.1 pg (ref 26.0–34.0)
MCHC: 35.3 g/dL (ref 30.0–36.0)
MCV: 93.8 fL (ref 78.0–100.0)
Monocytes Absolute: 0.9 10*3/uL (ref 0.1–1.0)
Monocytes Relative: 12 % (ref 3–12)
Platelets: 207 10*3/uL (ref 150–400)
RBC: 4.38 MIL/uL (ref 3.87–5.11)

## 2013-08-05 LAB — BASIC METABOLIC PANEL
BUN: 16 mg/dL (ref 6–23)
CO2: 26 mEq/L (ref 19–32)
Calcium: 9.2 mg/dL (ref 8.4–10.5)
GFR calc non Af Amer: 57 mL/min — ABNORMAL LOW (ref >90)
Glucose, Bld: 95 mg/dL (ref 70–99)
Potassium: 4 mEq/L (ref 3.5–5.1)
Sodium: 139 mEq/L (ref 135–145)

## 2013-08-05 MED ORDER — DEXAMETHASONE SODIUM PHOSPHATE 10 MG/ML IJ SOLN
10.0000 mg | INTRAMUSCULAR | Status: AC
  Administered 2013-08-06: 10 mg via INTRAVENOUS
  Filled 2013-08-05: qty 1

## 2013-08-05 MED ORDER — CEFAZOLIN SODIUM-DEXTROSE 2-3 GM-% IV SOLR
2.0000 g | INTRAVENOUS | Status: AC
  Administered 2013-08-06: 2 g via INTRAVENOUS
  Filled 2013-08-05: qty 50

## 2013-08-05 NOTE — Progress Notes (Signed)
Care giver is Vear Clock from [facility] [phone].Marland Kitchen Schedule is Mon- Fri 3 hours and Sat, Sun 2 hours.  Pt is requiring more care than what is scheduled because she is unable to stand and refuses to go to a nursing home.

## 2013-08-05 NOTE — Progress Notes (Signed)
I saw and evaluated the patient.  I personally confirmed the key portions of Dr. Alford Highland history and exam and reviewed pertinent patient test results.  The assessment, diagnosis, and plan were formulated together and I agree with the documentation in the resident's note.  [name]' lower extremity complaints are not acute and she is scheduled to have neurosurgical intervention on Tuesday.  She is likely getting better care at home now than she would in the hospital.  Therefore, continue plan for surgical intervention as already scheduled.

## 2013-08-05 NOTE — Progress Notes (Signed)
Pt has a pressure sore on sacrum per patient and her care giver.  I am unable to see, pt is unable to stand from wheelchair.

## 2013-08-05 NOTE — Pre-Procedure Instructions (Addendum)
[name]  08/05/2013   Your procedure is scheduled on:  Tuesday, October 28th.  Report to [facility], Main Entrance/ Entrance "A" at 8:15 AM.  Call this number if you have problems the morning of surgery: [phone]   Remember:   Do not eat food or drink liquids after midnight, tonight.   Take these medicines the morning of surgery with A SIP OF WATER: metoprolol tartrate (LOPRESSOR). Take if needed:HYDROcodone-acetaminophen (NORCO/VICODIN).  Stop using :diclofenac sodium (VOLTAREN) 1 % GEL.      Do not wear jewelry, make-up or nail polish.  Do not wear lotions, powders, or perfumes. You may wear deodorant.  Do not shave 48 hours prior to surgery. Men may shave face and neck.  Do not bring valuables to the hospital.  [facility] is not responsible for any belongings or valuables.               Contacts, dentures or bridgework may not be worn into surgery.  Leave suitcase in the car. After surgery it may be brought to your room.  For patients admitted to the hospital, discharge time is determined by your treatment team.               Patients discharged the day of surgery will not be allowed to drive home.  Name and phone number of your driver: -   Special Instructions:Shower with CHG wash (Bactoshield) tonight and again in the am prior to arriving to hospital.   Please read over the following fact sheets that you were given: Pain Booklet, Coughing and Deep Breathing and Surgical Site Infection Prevention

## 2013-08-06 ENCOUNTER — Encounter (HOSPITAL_COMMUNITY): Payer: Self-pay | Admitting: Certified Registered Nurse Anesthetist

## 2013-08-06 ENCOUNTER — Encounter (HOSPITAL_COMMUNITY)
Admission: RE | Disposition: A | Discharge status: Skilled Nursing Facility | Payer: Self-pay | Source: Ambulatory Visit | Attending: Neurosurgery

## 2013-08-06 ENCOUNTER — Encounter (HOSPITAL_COMMUNITY): Payer: Medicare Other | Admitting: Anesthesiology

## 2013-08-06 ENCOUNTER — Inpatient Hospital Stay (HOSPITAL_COMMUNITY): Payer: Medicare Other | Admitting: Anesthesiology

## 2013-08-06 ENCOUNTER — Inpatient Hospital Stay (HOSPITAL_COMMUNITY): Payer: Medicare Other

## 2013-08-06 ENCOUNTER — Inpatient Hospital Stay (HOSPITAL_COMMUNITY)
Admission: RE | Admit: 2013-08-06 | Discharge: 2013-08-12 | DRG: 520 | Disposition: A | Discharge status: Skilled Nursing Facility | Payer: Medicare Other | Source: Ambulatory Visit | Attending: Neurosurgery | Admitting: Neurosurgery

## 2013-08-06 DIAGNOSIS — M810 Age-related osteoporosis without current pathological fracture: Secondary | ICD-10-CM | POA: Diagnosis present

## 2013-08-06 DIAGNOSIS — M129 Arthropathy, unspecified: Secondary | ICD-10-CM | POA: Diagnosis present

## 2013-08-06 DIAGNOSIS — M5104 Intervertebral disc disorders with myelopathy, thoracic region: Principal | ICD-10-CM | POA: Diagnosis present

## 2013-08-06 DIAGNOSIS — G894 Chronic pain syndrome: Secondary | ICD-10-CM | POA: Diagnosis present

## 2013-08-06 DIAGNOSIS — Z01810 Encounter for preprocedural cardiovascular examination: Secondary | ICD-10-CM

## 2013-08-06 DIAGNOSIS — I10 Essential (primary) hypertension: Secondary | ICD-10-CM | POA: Diagnosis present

## 2013-08-06 DIAGNOSIS — Z01812 Encounter for preprocedural laboratory examination: Secondary | ICD-10-CM

## 2013-08-06 DIAGNOSIS — F329 Major depressive disorder, single episode, unspecified: Secondary | ICD-10-CM | POA: Diagnosis present

## 2013-08-06 DIAGNOSIS — Z8673 Personal history of transient ischemic attack (TIA), and cerebral infarction without residual deficits: Secondary | ICD-10-CM

## 2013-08-06 DIAGNOSIS — F3289 Other specified depressive episodes: Secondary | ICD-10-CM | POA: Diagnosis present

## 2013-08-06 DIAGNOSIS — IMO0002 Reserved for concepts with insufficient information to code with codable children: Secondary | ICD-10-CM | POA: Diagnosis present

## 2013-08-06 HISTORY — PX: LUMBAR LAMINECTOMY/DECOMPRESSION MICRODISCECTOMY: SHX5026

## 2013-08-06 SURGERY — LUMBAR LAMINECTOMY/DECOMPRESSION MICRODISCECTOMY 1 LEVEL
Anesthesia: General | Site: Back | Laterality: Left | Wound class: Clean

## 2013-08-06 MED ORDER — PHENOL 1.4 % MT LIQD: 1.0000 | OROMUCOSAL | Status: DC | PRN

## 2013-08-06 MED ORDER — ACETAMINOPHEN 650 MG RE SUPP: 650.0000 mg | RECTAL | Status: DC | PRN

## 2013-08-06 MED ORDER — SODIUM CHLORIDE 0.9 % IJ SOLN
3.0000 mL | Freq: Two times a day (BID) | INTRAMUSCULAR | Status: DC
  Administered 2013-08-06 – 2013-08-11 (×10): 3 mL via INTRAVENOUS

## 2013-08-06 MED ORDER — "CVS HYDROCOLLOID 2""X2"" EX PADS": 1.0000 | MEDICATED_PAD | CUTANEOUS | Status: DC

## 2013-08-06 MED ORDER — ROCURONIUM BROMIDE 100 MG/10ML IV SOLN
INTRAVENOUS | Status: DC | PRN
  Administered 2013-08-06: 35 mg via INTRAVENOUS

## 2013-08-06 MED ORDER — MIDAZOLAM HCL 2 MG/2ML IJ SOLN: 1.0000 mg | INTRAMUSCULAR | Status: DC | PRN

## 2013-08-06 MED ORDER — POLYVINYL ALCOHOL 1.4 % OP SOLN
1.0000 [drp] | Freq: Three times a day (TID) | OPHTHALMIC | Status: DC
  Administered 2013-08-06 – 2013-08-12 (×16): 1 [drp] via OPHTHALMIC
  Filled 2013-08-06: qty 15

## 2013-08-06 MED ORDER — ONDANSETRON HCL 4 MG/2ML IJ SOLN
INTRAMUSCULAR | Status: DC | PRN
  Administered 2013-08-06: 4 mg via INTRAVENOUS

## 2013-08-06 MED ORDER — CARBOXYMETHYLCELLULOSE SODIUM 0.5 % OP SOLN: 1.0000 [drp] | Freq: Three times a day (TID) | OPHTHALMIC | Status: DC

## 2013-08-06 MED ORDER — GLYCOPYRROLATE 0.2 MG/ML IJ SOLN
INTRAMUSCULAR | Status: DC | PRN
  Administered 2013-08-06: 0.6 mg via INTRAVENOUS

## 2013-08-06 MED ORDER — PROPOFOL 10 MG/ML IV BOLUS
INTRAVENOUS | Status: DC | PRN
  Administered 2013-08-06: 100 mg via INTRAVENOUS

## 2013-08-06 MED ORDER — FENTANYL CITRATE 0.05 MG/ML IJ SOLN: 50.0000 ug | Freq: Once | INTRAMUSCULAR | Status: DC

## 2013-08-06 MED ORDER — HYDROMORPHONE HCL PF 1 MG/ML IJ SOLN: 0.5000 mg | INTRAMUSCULAR | Status: DC | PRN

## 2013-08-06 MED ORDER — FUROSEMIDE 20 MG PO TABS
20.0000 mg | ORAL_TABLET | Freq: Every day | ORAL | Status: DC
  Administered 2013-08-06 – 2013-08-12 (×7): 20 mg via ORAL
  Filled 2013-08-06 (×7): qty 1

## 2013-08-06 MED ORDER — ONDANSETRON HCL 4 MG/2ML IJ SOLN: 4.0000 mg | INTRAMUSCULAR | Status: DC | PRN

## 2013-08-06 MED ORDER — NEOSTIGMINE METHYLSULFATE 1 MG/ML IJ SOLN
INTRAMUSCULAR | Status: DC | PRN
Start: 2013-08-06 — End: 2013-08-06
  Administered 2013-08-06: 5 mg via INTRAVENOUS

## 2013-08-06 MED ORDER — THROMBIN 5000 UNITS EX SOLR
CUTANEOUS | Status: DC | PRN
  Administered 2013-08-06 (×2): 5000 [IU] via TOPICAL

## 2013-08-06 MED ORDER — FENTANYL CITRATE 0.05 MG/ML IJ SOLN
25.0000 ug | INTRAMUSCULAR | Status: DC | PRN
  Administered 2013-08-06 (×2): 25 ug via INTRAVENOUS

## 2013-08-06 MED ORDER — ARTIFICIAL TEARS OP OINT
TOPICAL_OINTMENT | OPHTHALMIC | Status: DC | PRN
  Administered 2013-08-06: 1 via OPHTHALMIC

## 2013-08-06 MED ORDER — HEMOSTATIC AGENTS (NO CHARGE) OPTIME
TOPICAL | Status: DC | PRN
  Administered 2013-08-06: 1 via TOPICAL

## 2013-08-06 MED ORDER — HYDROCODONE-ACETAMINOPHEN 5-325 MG PO TABS
1.0000 | ORAL_TABLET | ORAL | Status: DC | PRN
  Administered 2013-08-07 – 2013-08-09 (×2): 1 via ORAL
  Filled 2013-08-06 (×2): qty 1

## 2013-08-06 MED ORDER — SENNA 8.6 MG PO TABS
1.0000 | ORAL_TABLET | Freq: Two times a day (BID) | ORAL | Status: DC
  Administered 2013-08-06 – 2013-08-12 (×12): 8.6 mg via ORAL
  Filled 2013-08-06 (×13): qty 1

## 2013-08-06 MED ORDER — FENTANYL CITRATE 0.05 MG/ML IJ SOLN
INTRAMUSCULAR | Status: AC
  Filled 2013-08-06: qty 2

## 2013-08-06 MED ORDER — TIMOLOL MALEATE 0.5 % OP SOLN
1.0000 [drp] | Freq: Every day | OPHTHALMIC | Status: DC
  Administered 2013-08-06 – 2013-08-12 (×7): 1 [drp] via OPHTHALMIC
  Filled 2013-08-06: qty 5

## 2013-08-06 MED ORDER — SODIUM CHLORIDE 0.9 % IV SOLN
10.0000 mg | INTRAVENOUS | Status: DC | PRN
  Administered 2013-08-06: 20 ug/min via INTRAVENOUS

## 2013-08-06 MED ORDER — SODIUM CHLORIDE 0.9 % IV SOLN: 250.0000 mL | INTRAVENOUS | Status: DC

## 2013-08-06 MED ORDER — ACETAMINOPHEN 325 MG PO TABS
650.0000 mg | ORAL_TABLET | ORAL | Status: DC | PRN
  Administered 2013-08-09: 650 mg via ORAL

## 2013-08-06 MED ORDER — SODIUM CHLORIDE 0.9 % IR SOLN
Status: DC | PRN
  Administered 2013-08-06: 11:00:00

## 2013-08-06 MED ORDER — PNEUMOCOCCAL VAC POLYVALENT 25 MCG/0.5ML IJ INJ
0.5000 mL | INJECTION | INTRAMUSCULAR | Status: DC
  Filled 2013-08-06: qty 0.5

## 2013-08-06 MED ORDER — ALBUMIN HUMAN 5 % IV SOLN
INTRAVENOUS | Status: DC | PRN
  Administered 2013-08-06: 11:00:00 via INTRAVENOUS

## 2013-08-06 MED ORDER — BUPIVACAINE HCL (PF) 0.25 % IJ SOLN
INTRAMUSCULAR | Status: DC | PRN
  Administered 2013-08-06: 20 mL

## 2013-08-06 MED ORDER — LACTATED RINGERS IV SOLN
INTRAVENOUS | Status: DC | PRN
  Administered 2013-08-06: 10:00:00 via INTRAVENOUS

## 2013-08-06 MED ORDER — SODIUM CHLORIDE 0.9 % IJ SOLN: 3.0000 mL | INTRAMUSCULAR | Status: DC | PRN

## 2013-08-06 MED ORDER — ASPIRIN EC 81 MG PO TBEC
81.0000 mg | DELAYED_RELEASE_TABLET | Freq: Every day | ORAL | Status: DC
  Administered 2013-08-07 – 2013-08-12 (×6): 81 mg via ORAL
  Filled 2013-08-06 (×7): qty 1

## 2013-08-06 MED ORDER — LIDOCAINE HCL (CARDIAC) 20 MG/ML IV SOLN
INTRAVENOUS | Status: DC | PRN
  Administered 2013-08-06: 65 mg via INTRAVENOUS

## 2013-08-06 MED ORDER — PHENYLEPHRINE HCL 10 MG/ML IJ SOLN
INTRAMUSCULAR | Status: DC | PRN
  Administered 2013-08-06 (×5): 80 ug via INTRAVENOUS

## 2013-08-06 MED ORDER — ALUM & MAG HYDROXIDE-SIMETH 200-200-20 MG/5ML PO SUSP: 30.0000 mL | Freq: Four times a day (QID) | ORAL | Status: DC | PRN

## 2013-08-06 MED ORDER — GABAPENTIN 300 MG PO CAPS
300.0000 mg | ORAL_CAPSULE | Freq: Every day | ORAL | Status: DC
  Administered 2013-08-06 – 2013-08-11 (×6): 300 mg via ORAL
  Filled 2013-08-06 (×7): qty 1

## 2013-08-06 MED ORDER — OXYCODONE-ACETAMINOPHEN 5-325 MG PO TABS
1.0000 | ORAL_TABLET | ORAL | Status: DC | PRN
  Administered 2013-08-07: 1 via ORAL
  Administered 2013-08-08: 2 via ORAL
  Administered 2013-08-10 – 2013-08-12 (×4): 1 via ORAL
  Filled 2013-08-06 (×4): qty 1
  Filled 2013-08-06: qty 2
  Filled 2013-08-06: qty 1

## 2013-08-06 MED ORDER — FENTANYL CITRATE 0.05 MG/ML IJ SOLN
INTRAMUSCULAR | Status: DC | PRN
  Administered 2013-08-06 (×2): 75 ug via INTRAVENOUS

## 2013-08-06 MED ORDER — CYCLOBENZAPRINE HCL 10 MG PO TABS
10.0000 mg | ORAL_TABLET | Freq: Three times a day (TID) | ORAL | Status: DC | PRN
  Administered 2013-08-07 – 2013-08-09 (×2): 10 mg via ORAL
  Filled 2013-08-06 (×2): qty 1

## 2013-08-06 MED ORDER — LACTATED RINGERS IV SOLN
INTRAVENOUS | Status: DC
  Administered 2013-08-06: 09:00:00 via INTRAVENOUS

## 2013-08-06 MED ORDER — MENTHOL 3 MG MT LOZG: 1.0000 | LOZENGE | OROMUCOSAL | Status: DC | PRN

## 2013-08-06 MED ORDER — FERROUS SULFATE 325 (65 FE) MG PO TABS
325.0000 mg | ORAL_TABLET | Freq: Two times a day (BID) | ORAL | Status: DC
  Administered 2013-08-06 – 2013-08-12 (×13): 325 mg via ORAL
  Filled 2013-08-06 (×14): qty 1

## 2013-08-06 MED ORDER — CEFAZOLIN SODIUM 1-5 GM-% IV SOLN
1.0000 g | Freq: Three times a day (TID) | INTRAVENOUS | Status: AC
  Administered 2013-08-06 (×2): 1 g via INTRAVENOUS
  Filled 2013-08-06 (×2): qty 50

## 2013-08-06 MED ORDER — METOPROLOL TARTRATE 25 MG PO TABS
25.0000 mg | ORAL_TABLET | Freq: Two times a day (BID) | ORAL | Status: DC
  Administered 2013-08-06 – 2013-08-12 (×12): 25 mg via ORAL
  Filled 2013-08-06 (×14): qty 1

## 2013-08-06 SURGICAL SUPPLY — 57 items
BAG DECANTER FOR FLEXI CONT (MISCELLANEOUS) ×2 IMPLANT
BENZOIN TINCTURE PRP APPL 2/3 (GAUZE/BANDAGES/DRESSINGS) ×2 IMPLANT
BLADE SURG ROTATE 9660 (MISCELLANEOUS) IMPLANT
BRUSH SCRUB EZ PLAIN DRY (MISCELLANEOUS) ×2 IMPLANT
BUR CUTTER 7.0 ROUND (BURR) ×2 IMPLANT
BUR MATCHSTICK NEURO 3.0 LAGG (BURR) ×2 IMPLANT
CANISTER SUCT 3000ML (MISCELLANEOUS) ×2 IMPLANT
CONT SPEC 4OZ CLIKSEAL STRL BL (MISCELLANEOUS) ×2 IMPLANT
DECANTER SPIKE VIAL GLASS SM (MISCELLANEOUS) IMPLANT
DERMABOND ADHESIVE PROPEN (GAUZE/BANDAGES/DRESSINGS) ×1
DERMABOND ADVANCED (GAUZE/BANDAGES/DRESSINGS)
DERMABOND ADVANCED .7 DNX12 (GAUZE/BANDAGES/DRESSINGS) IMPLANT
DERMABOND ADVANCED .7 DNX6 (GAUZE/BANDAGES/DRESSINGS) ×1 IMPLANT
DRAPE LAPAROTOMY 100X72X124 (DRAPES) ×2 IMPLANT
DRAPE MICROSCOPE LEICA (MISCELLANEOUS) ×2 IMPLANT
DRAPE MICROSCOPE ZEISS OPMI (DRAPES) IMPLANT
DRAPE POUCH INSTRU U-SHP 10X18 (DRAPES) ×2 IMPLANT
DRAPE PROXIMA HALF (DRAPES) IMPLANT
DRAPE SURG 17X23 STRL (DRAPES) ×4 IMPLANT
DRSG OPSITE POSTOP 4X8 (GAUZE/BANDAGES/DRESSINGS) ×2 IMPLANT
DURAPREP 26ML APPLICATOR (WOUND CARE) ×2 IMPLANT
ELECT REM PT RETURN 9FT ADLT (ELECTROSURGICAL) ×2
ELECTRODE REM PT RTRN 9FT ADLT (ELECTROSURGICAL) ×1 IMPLANT
GAUZE SPONGE 4X4 16PLY XRAY LF (GAUZE/BANDAGES/DRESSINGS) IMPLANT
GLOVE BIO SURGEON STRL SZ8 (GLOVE) ×2 IMPLANT
GLOVE BIOGEL PI IND STRL 7.0 (GLOVE) ×2 IMPLANT
GLOVE BIOGEL PI IND STRL 8.5 (GLOVE) ×1 IMPLANT
GLOVE BIOGEL PI INDICATOR 7.0 (GLOVE) ×2
GLOVE BIOGEL PI INDICATOR 8.5 (GLOVE) ×1
GLOVE ECLIPSE 8.5 STRL (GLOVE) ×2 IMPLANT
GLOVE EXAM NITRILE LRG STRL (GLOVE) IMPLANT
GLOVE EXAM NITRILE MD LF STRL (GLOVE) ×2 IMPLANT
GLOVE EXAM NITRILE XL STR (GLOVE) IMPLANT
GLOVE EXAM NITRILE XS STR PU (GLOVE) IMPLANT
GLOVE SS BIOGEL STRL SZ 6.5 (GLOVE) ×2 IMPLANT
GLOVE SUPERSENSE BIOGEL SZ 6.5 (GLOVE) ×2
GOWN BRE IMP SLV AUR LG STRL (GOWN DISPOSABLE) ×2 IMPLANT
GOWN BRE IMP SLV AUR XL STRL (GOWN DISPOSABLE) ×4 IMPLANT
GOWN STRL REIN 2XL LVL4 (GOWN DISPOSABLE) IMPLANT
KIT BASIN OR (CUSTOM PROCEDURE TRAY) ×2 IMPLANT
KIT ROOM TURNOVER OR (KITS) ×2 IMPLANT
NEEDLE HYPO 22GX1.5 SAFETY (NEEDLE) ×2 IMPLANT
NEEDLE SPNL 22GX3.5 QUINCKE BK (NEEDLE) ×2 IMPLANT
NS IRRIG 1000ML POUR BTL (IV SOLUTION) ×2 IMPLANT
PACK LAMINECTOMY NEURO (CUSTOM PROCEDURE TRAY) ×2 IMPLANT
PAD ARMBOARD 7.5X6 YLW CONV (MISCELLANEOUS) ×6 IMPLANT
RUBBERBAND STERILE (MISCELLANEOUS) ×4 IMPLANT
SPONGE GAUZE 4X4 12PLY (GAUZE/BANDAGES/DRESSINGS) ×2 IMPLANT
SPONGE SURGIFOAM ABS GEL SZ50 (HEMOSTASIS) ×2 IMPLANT
STRIP CLOSURE SKIN 1/2X4 (GAUZE/BANDAGES/DRESSINGS) ×2 IMPLANT
SUT VIC AB 2-0 CT1 18 (SUTURE) ×2 IMPLANT
SUT VIC AB 3-0 SH 8-18 (SUTURE) ×2 IMPLANT
SYR 20ML ECCENTRIC (SYRINGE) ×2 IMPLANT
TAPE CLOTH SURG 4X10 WHT LF (GAUZE/BANDAGES/DRESSINGS) ×2 IMPLANT
TOWEL OR 17X24 6PK STRL BLUE (TOWEL DISPOSABLE) ×2 IMPLANT
TOWEL OR 17X26 10 PK STRL BLUE (TOWEL DISPOSABLE) ×2 IMPLANT
WATER STERILE IRR 1000ML POUR (IV SOLUTION) ×2 IMPLANT

## 2013-08-06 NOTE — Transfer of Care (Signed)
Immediate Anesthesia Transfer of Care Note  Patient: [name]  Procedure(s) Performed: Procedure(s) with comments: Left Thoracic eleven-twelve microdiskectomy  (Left) - Left Thoracic eleven-twelve microdiskectomy   Patient Location: PACU  Anesthesia Type:General  Level of Consciousness: awake, alert , oriented and patient cooperative  Airway & Oxygen Therapy: Patient Spontanous Breathing  Post-op Assessment: Report given to PACU RN, Post -op Vital signs reviewed and stable and Patient moving all extremities X 4  Post vital signs: Reviewed and stable  Complications: No apparent anesthesia complications

## 2013-08-06 NOTE — Anesthesia Preprocedure Evaluation (Addendum)
Anesthesia Evaluation  Patient identified by MRN, date of birth, ID band Patient awake    Reviewed: Allergy & Precautions, H&P , NPO status , Patient's Chart, lab work & pertinent test results  Airway Mallampati: I TM Distance: >3 FB Neck ROM: Full    Dental   Pulmonary shortness of breath,  breath sounds clear to auscultation        Cardiovascular hypertension, + dysrhythmias Rhythm:Regular Rate:Normal  Atrial tach   Neuro/Psych  Headaches, Depression CVA    GI/Hepatic GERD-  ,  Endo/Other    Renal/GU      Musculoskeletal   Abdominal   Peds  Hematology   Anesthesia Other Findings   Reproductive/Obstetrics                           Anesthesia Physical Anesthesia Plan  ASA: III  Anesthesia Plan: General   Post-op Pain Management:    Induction: Intravenous  Airway Management Planned: Oral ETT  Additional Equipment:   Intra-op Plan:   Post-operative Plan: Extubation in OR  Informed Consent: I have reviewed the patients History and Physical, chart, labs and discussed the procedure including the risks, benefits and alternatives for the proposed anesthesia with the patient or authorized representative who has indicated his/her understanding and acceptance.     Plan Discussed with: CRNA and Surgeon  Anesthesia Plan Comments:         Anesthesia Quick Evaluation

## 2013-08-06 NOTE — H&P (Signed)
[name] is an 79 y.o. female.   Chief Complaint: Back pain and weakness HPI: 79 year old female with severe upper lumbar pain and bilateral lower extremity weakness progressing over the past few months. Workup has demonstrated evidence of a large thoracic disc herniation at T11-12 with marked spinal cord compression. Patient presents now for decompression in hopes of improving her symptoms.  Past Medical History  Diagnosis Date  . Hypertension   . Head ache   . Renal insufficiency, mild   . Depression   . Fecal occult blood test positive   . Restless leg syndrome   . Anemia   . Cataracts, bilateral   . Hemorrhoids   . Rupture of rotator cuff of shoulder     s/p repair by Dr. Luiz Blare 7/07. Follows with Dr. Ave Filter for possible shoulder surgery.   . Lumbar spinal stenosis     gets Spinal injections by Dr. Callie Fielding   . Postnasal drip   . Atrial tachycardia     echo 04/08/11: EF 55-60%, mild LVH, grade 1 diast dysfxn;    s/p RFCA with Dr. Ladona Ridgel  04/09/11  . Hyperkalemia 04/08/2011  . Unspecified deficiency anemia   . Osteoporosis, unspecified 04/09/2013    DEXA 5/07 : T L forearm -2.9. L femur -0.6, R femur -1.4. [name]  . CVA (cerebral infarction) 04/09/2013    MRI 2005 : Multiple tiny old lacunar infarcts as well as scattered white matter small vessel ischemic disease. Pt asymptomatic and denies TIA / CVA hx.    . Chronic pain syndrome 04/09/2013    On chronic opioids. Cervical MRI 11/ 2012 : Diffuse cervical spondylosis, Diffuse degenerative disc disease with multilevel foraminal stenosis. Possible T1 nerve root involvement. Lumbar MRI 2009 : prior fusion at L3-4 and degenerative disc disease Rotator cuff repair : Dr. Luiz Blare 7/07. Follows with Dr. Ave Filter for possible shoulder surgery.  H/O lumbar spinal stenosis with steroid injections. Mo  . Arthritis     b/l shoulder R>L  . Rotator cuff tear arthropathy     03/22/13 dx [facility] (Dr. Ave Filter)  considering surgery  . Stroke   . History of blood transfusion 2014    anemia-     Past Surgical History  Procedure Laterality Date  . Doppler echocardiography  2004  . Rotator cuff repair      right  . Back surgery      lumbar x 2, 2000 and 2004 by Dr. Lelon Perla.   . Eye surgery Bilateral     cataract  . Abdominal hysterectomy    . Orif finger fracture Left     ring finger    Family History  Problem Relation Age of Onset  . Coronary artery disease Neg Hx     premature   Social History:  reports that she has never smoked. She does not have any smokeless tobacco history on file. She reports that she does not drink alcohol or use illicit drugs.  Allergies:  Allergies  Allergen Reactions  . Codeine     Hallucinations  . Ibuprofen     Confusion    Medications Prior to Admission  Medication Sig Dispense Refill  . aspirin EC 81 MG tablet Take 81 mg by mouth daily.      . carboxymethylcellulose (REFRESH PLUS) 0.5 % SOLN Place 1 drop into the right eye 3 (three) times daily.       . ferrous sulfate 325 (65 FE) MG tablet Take 325 mg  by mouth 2 (two) times daily.      . furosemide (LASIX) 20 MG tablet Take 1 tablet (20 mg total) by mouth daily.  30 tablet  1  . gabapentin (NEURONTIN) 100 MG capsule Take 1 capsule by mouth at  bedtime  60 capsule  2  . metoprolol tartrate (LOPRESSOR) 25 MG tablet Take 1 tablet (25 mg total) by mouth 2 (two) times daily.  60 tablet  3  . timolol (TIMOPTIC) 0.5 % ophthalmic solution Place 1 drop into the right eye daily.       . Wound Dressings ([employer] HYDROCOLLOID 2"X2") PADS Apply 1 each topically every 3 (three) days.  4 each  5  . diclofenac sodium (VOLTAREN) 1 % GEL Apply topically 4 grams/4  times daily as needed for pain  100 g  3  . polyethylene glycol powder (MIRALAX) powder Take 17 g by mouth 2 (two) times daily as needed. For constipation        Results for orders placed during the hospital encounter of 08/05/13 (from the past 48  hour(s))  BASIC METABOLIC PANEL     Status: Abnormal   Collection Time    08/05/13  1:27 PM      Result Value Range   Sodium 139  135 - 145 mEq/L   Potassium 4.0  3.5 - 5.1 mEq/L   Chloride 104  96 - 112 mEq/L   CO2 26  19 - 32 mEq/L   Glucose, Bld 95  70 - 99 mg/dL   BUN 16  6 - 23 mg/dL   Creatinine, Ser 1.61  0.50 - 1.10 mg/dL   Calcium 9.2  8.4 - 09.6 mg/dL   GFR calc non Af Amer 57 (*) >90 mL/min   GFR calc Af Amer 66 (*) >90 mL/min   Comment: (NOTE)     The eGFR has been calculated using the CKD EPI equation.     This calculation has not been validated in all clinical situations.     eGFR's persistently <90 mL/min signify possible Chronic Kidney     Disease.  CBC WITH DIFFERENTIAL     Status: None   Collection Time    08/05/13  1:27 PM      Result Value Range   WBC 7.1  4.0 - 10.5 K/uL   RBC 4.38  3.87 - 5.11 MIL/uL   Hemoglobin 14.5  12.0 - 15.0 g/dL   HCT 04.5  40.9 - 81.1 %   MCV 93.8  78.0 - 100.0 fL   MCH 33.1  26.0 - 34.0 pg   MCHC 35.3  30.0 - 36.0 g/dL   RDW 91.4  78.2 - 95.6 %   Platelets 207  150 - 400 K/uL   Neutrophils Relative % 47  43 - 77 %   Neutro Abs 3.4  1.7 - 7.7 K/uL   Lymphocytes Relative 39  12 - 46 %   Lymphs Abs 2.8  0.7 - 4.0 K/uL   Monocytes Relative 12  3 - 12 %   Monocytes Absolute 0.9  0.1 - 1.0 K/uL   Eosinophils Relative 1  0 - 5 %   Eosinophils Absolute 0.1  0.0 - 0.7 K/uL   Basophils Relative 0  0 - 1 %   Basophils Absolute 0.0  0.0 - 0.1 K/uL  SURGICAL PCR SCREEN     Status: None   Collection Time    08/05/13  1:28 PM      Result Value Range  MRSA, PCR NEGATIVE  NEGATIVE   Staphylococcus aureus NEGATIVE  NEGATIVE   Comment:            The Xpert SA Assay (FDA     approved for NASAL specimens     in patients over 44 years of age),     is one component of     a comprehensive surveillance     program.  Test performance has     been validated by [employer] for patients greater     than or equal to 59 year old.     It  is not intended     to diagnose infection nor to     guide or monitor treatment.   Dg Chest 2 View  08/05/2013   CLINICAL DATA:  Thoracic microdiscectomy. History of hypertension.  EXAM: CHEST  2 VIEW  COMPARISON:  CHEST x-ray 04/06/2011.  FINDINGS: Lung volumes are normal. No consolidative airspace disease. No pleural effusions. No pneumothorax. No pulmonary nodule or mass noted. Pulmonary vasculature and the cardiomediastinal silhouette are within normal limits. Atherosclerosis and tortuosity of the thoracic aorta.  IMPRESSION: 1. No radiographic evidence of acute cardiopulmonary disease. 2. Atherosclerosis and tortuosity of the thoracic aorta.   Electronically Signed   By: Trudie Reed M.D.   On: 08/05/2013 14:33    Review of Systems  Constitutional: Negative.   HENT: Negative.   Eyes: Negative.   Respiratory: Negative.   Cardiovascular: Negative.   Gastrointestinal: Negative.   Genitourinary: Negative.   Musculoskeletal: Negative.   Skin: Negative.   Neurological: Negative.   Endo/Heme/Allergies: Negative.   Psychiatric/Behavioral: Negative.     Blood pressure 131/74, pulse 66, temperature 97.6 F (36.4 C), temperature source Oral, resp. rate 20, SpO2 100.00%. Physical Exam  Constitutional: She is oriented to person, place, and time. She appears well-developed and well-nourished.  HENT:  Head: Normocephalic and atraumatic.  Right Ear: External ear normal.  Left Ear: External ear normal.  Nose: Nose normal.  Mouth/Throat: Oropharynx is clear and moist. No oropharyngeal exudate.  Eyes: Conjunctivae and EOM are normal. Pupils are equal, round, and reactive to light. Right eye exhibits no discharge. Left eye exhibits no discharge. No scleral icterus.  Neck: Normal range of motion. Neck supple. No tracheal deviation present. No thyromegaly present.  Cardiovascular: Normal rate, regular rhythm, normal heart sounds and intact distal pulses.  Exam reveals no friction rub.   No  murmur heard. Respiratory: Effort normal and breath sounds normal. No respiratory distress. She has no wheezes.  GI: Soft. Bowel sounds are normal. She exhibits no distension. There is no tenderness.  Musculoskeletal: Normal range of motion. She exhibits no edema and no tenderness.  Neurological: She is alert and oriented to person, place, and time. She displays normal reflexes. No cranial nerve deficit. She exhibits normal muscle tone.  Bilateral lower extremity weakness. Relative sensory loss with relative sensory level at T12. Hyporeflexic bilateral lower extremities.  Skin: Skin is warm and dry. No rash noted. No erythema. No pallor.  Psychiatric: She has a normal mood and affect. Her behavior is normal. Judgment and thought content normal.     Assessment/Plan T11-T12 herniated nucleus pulposus with myelopathy. Plan left T11-T12 laminotomy and transpedicular microdiscectomy. Risks and benefits have been explained. Patient wishes to proceed.  Nickolaos Brallier A 08/06/2013, 9:49 AM

## 2013-08-06 NOTE — Op Note (Signed)
Date of procedure: 08/06/2013  Date of dictation: Same  Service: Neurosurgery  Preoperative diagnosis: Left T11/T12 herniated nucleus pulposus with myelopathy  Postoperative diagnosis: Same  Procedure Name: Left T11-T12 laminotomy and left T12 transpedicular microdiscectomy  Surgeon:Marlette Curvin A.Cinnamon Morency, M.D.  Asst. Surgeon: Venetia Maxon  Anesthesia: General  Indication: 79 year old female with bilateral lower extremity weakness and sensory loss. Workup demonstrates evidence of a very large left-sided T11-T12 disc herniation with severe spinal cord compression. Patient presents now for microdiscectomy in hopes of improving her symptoms.  Operative note: After induction of anesthesia, patient positioned prone onto Wilson frame and appropriately padded. The thoracic and lumbar region prepped and draped. Incision made overlying T11-T12. Subperiosteal dissection performed the left side. Retractor placed. X-ray taken. Level confirmed. Left hemilaminectomy was then performed using high-speed drill and Kerrison rongeurs to remove the hemilamina of T11 and the superior aspect of T12. Medial facetectomy of T11-12 was performed. Pedicle was then removed using a high-speed drill down flush to the vertebral body of T12. Microscope brought into the field these were microdissection. Ligament flavum elevated and resected. Underlying thecal sac and exiting T11 nerve root were notified. Microdissection of the spinal canal was then performed. Disc herniation dissected free. Multiple large fragments of disc material removed using nerve hooks and further removed using pituitary rongeurs. All elements the disc herniation resected. All loose or obviously degenerative disc material was removed and interspace at T11/T12. At this point a very thorough discectomy been achieved. There is no evidence of injury to thecal sac and nerve roots. Wounds an area with them by solution. Gelfoam was placed topically for hemostasis. Microscope and  retractors were removed. Hemostasis muscle achieved with electrocautery. Wounds and close in layers with Vicryl sutures. Steri-Strips and sterile dressing were applied. No apparent complications. Patient tolerated the procedure well. She returns to the recovery room postop.

## 2013-08-06 NOTE — Preoperative (Signed)
Beta Blockers   Reason not to administer Beta Blockers:Not Applicable  Pt took am lopressor 0700.

## 2013-08-06 NOTE — Anesthesia Postprocedure Evaluation (Signed)
  Anesthesia Post-op Note  Patient: [name]  Procedure(s) Performed: Procedure(s) with comments: Left Thoracic eleven-twelve microdiskectomy  (Left) - Left Thoracic eleven-twelve microdiskectomy   Patient Location: PACU  Anesthesia Type:General  Level of Consciousness: awake  Airway and Oxygen Therapy: Patient Spontanous Breathing  Post-op Pain: mild  Post-op Assessment: Post-op Vital signs reviewed, Patient's Cardiovascular Status Stable, Respiratory Function Stable, Patent Airway, No signs of Nausea or vomiting and Pain level controlled  Post-op Vital Signs: Reviewed and stable  Complications: No apparent anesthesia complications

## 2013-08-06 NOTE — Brief Op Note (Signed)
08/06/2013  11:52 AM  PATIENT:  [name]  79 y.o. female  PRE-OPERATIVE DIAGNOSIS:  Herniated Nucleus Pulposus  POST-OPERATIVE DIAGNOSIS:  Herniated Nucleus Pulposus  PROCEDURE:  Procedure(s) with comments: Left Thoracic eleven-twelve microdiskectomy  (Left) - Left Thoracic eleven-twelve microdiskectomy   SURGEON:  Surgeon(s) and Role:    * Temple Pacini, MD - Primary    * Maeola Harman, MD - Assisting  PHYSICIAN ASSISTANT:   ASSISTANTS:    ANESTHESIA:   general  EBL:  Total I/O In: 950 [I.V.:700; IV Piggyback:250] Out: 200 [Blood:200]  BLOOD ADMINISTERED:none  DRAINS: none   LOCAL MEDICATIONS USED:  MARCAINE     SPECIMEN:  No Specimen  DISPOSITION OF SPECIMEN:  N/A  COUNTS:  YES  TOURNIQUET:  * No tourniquets in log *  DICTATION: .Dragon Dictation  PLAN OF CARE: Admit for overnight observation  PATIENT DISPOSITION:  PACU - hemodynamically stable.   Delay start of Pharmacological VTE agent (>24hrs) due to surgical blood loss or risk of bleeding: yes

## 2013-08-07 ENCOUNTER — Encounter (HOSPITAL_COMMUNITY): Payer: Self-pay | Admitting: Neurosurgery

## 2013-08-07 MED ORDER — ENSURE COMPLETE PO LIQD
237.0000 mL | Freq: Two times a day (BID) | ORAL | Status: DC
  Administered 2013-08-07 – 2013-08-12 (×7): 237 mL via ORAL

## 2013-08-07 NOTE — Progress Notes (Signed)
Patient status is Outpatient with extended recovery; case sent to the medicare reviewer for possible upgrade in status/ inpatient; per Medicare guidelines patient needs a 3 day qualifying  inpatient stay to be admitted to a skilled nursing facility; per medicare reviewer unable to upgrade status. Talked to patient about going home with home health care services at discharge - patient is agreeable and requested [facility] services at discharge; patient has a personal caregiver 3hrs Monday - Friday and 2.5hrs on Sat/Sun; Attending MD if in agreement please order HHRN/PT/OT/SW at discharge; Alexis Goodell [phone]

## 2013-08-07 NOTE — Progress Notes (Signed)
Postop day 1. Pain well controlled. Legs feel stronger.  Afebrile. Vital stable. Wound clean and dry. Chest and abdomen benign. Motor grossly intact in both lower extremities. Sensory exam improved. Chest and abdomen benign.  Status post thoracic microdiscectomy. Overall doing well. Mobilize with therapy today.

## 2013-08-07 NOTE — Evaluation (Addendum)
Occupational Therapy Evaluation Patient Details Name: [name] MRN: [id number] DOB: March 18, 1925 Today's Date: 08/07/2013 Time: 1191-4782 OT Time Calculation (min): 29 min  OT Assessment / Plan / Recommendation History of present illness Pt is a pleasent 79 y.o. female s/p Left T11-T12 laminotomy and left T12 transpedicular microdiscectomy.   Clinical Impression   Pt presents with below problem list. Pt will benefit from acute OT to increase independence and decrease caregiver burden. Pt wanting to become more independent. Recommending SNF for further rehab.     OT Assessment  Patient needs continued OT Services    Follow Up Recommendations  SNF;Supervision/Assistance - 24 hour    Barriers to Discharge  Decreased caregiver support    Equipment Recommendations  Other (comment) (defer to next venue)    Recommendations for Other Services    Frequency  Min 2X/week    Precautions / Restrictions Precautions Precautions: Fall;Back Restrictions Weight Bearing Restrictions: No   Pertinent Vitals/Pain Pain in back. Repositioned.     ADL  Eating/Feeding: Independent Where Assessed - Eating/Feeding: Chair Grooming: Set up;Supervision/safety Where Assessed - Grooming: Supported sitting Where Assessed - Lower Body Bathing: Supported sit to stand Upper Body Dressing: Minimal assistance Where Assessed - Upper Body Dressing: Supported sitting Lower Body Dressing: +2 Total assistance Lower Body Dressing: Patient Percentage: 10% Where Assessed - Lower Body Dressing: Supported sit to Pharmacist, hospital: +2 Total assistance (+1 Total A-stand pivot) Toilet Transfer: Patient Percentage: 50% Toilet Transfer Method: Sit to Barista: Other (comment) (from bed to recliner chair) Tub/Shower Transfer Method: Not assessed Equipment Used: Gait belt;Rolling walker;Reacher;Long-handled sponge;Long-handled shoe horn;Sock aid Transfers/Ambulation Related to ADLs: +2 Total  A (50%) for sit to stand and 60% for stand to sit transfer and +1 Total A for stand pivot transfer. ADL Comments: Educated on AE for LB ADLs as pt wants to be more independent. Pt did practice with reacher and sockaid. OT educated on use of two cups for teeth care and pt has tub bench at home which caregiver helps her, but discussed not twisting when getting in.    OT Diagnosis: Generalized weakness;Acute pain  OT Problem List: Decreased strength;Decreased activity tolerance;Impaired balance (sitting and/or standing);Decreased knowledge of use of DME or AE;Decreased knowledge of precautions;Pain OT Treatment Interventions: Self-care/ADL training;Therapeutic exercise;DME and/or AE instruction;Therapeutic activities;Patient/family education;Balance training   OT Goals(Current goals can be found in the care plan section) Acute Rehab OT Goals Patient Stated Goal: to be independent again OT Goal Formulation: With patient Time For Goal Achievement: 08/14/13 Potential to Achieve Goals: Good ADL Goals Pt Will Perform Upper Body Dressing: sitting;with set-up Pt Will Perform Lower Body Dressing: with mod assist;sit to/from stand;with adaptive equipment Pt Will Transfer to Toilet: with mod assist;bedside commode;stand pivot transfer Pt Will Perform Toileting - Clothing Manipulation and hygiene: sitting/lateral leans;sit to/from stand;with mod assist Additional ADL Goal #1: Pt will perform bed mobility at supervision level as precursor for ADLs.   Visit Information  Last OT Received On: 08/07/13 Assistance Needed: +2 PT/OT Co-Evaluation/Treatment: Yes History of Present Illness: Pt is a pleasent 79 y.o. female s/p Left T11-T12 laminotomy and left T12 transpedicular microdiscectomy.       Prior Functioning     Home Living Family/patient expects to be discharged to:: Private residence Living Arrangements: Alone Available Help at Discharge: Personal care attendant (lives next door can help PRN as  well) Type of Home: Apartment Home Access: Level entry Home Layout: One level Home Equipment: Environmental consultant - 2 wheels;Cane - single  point;Tub bench;Wheelchair - power Prior Function Level of Independence: Needs assistance ADL's / Homemaking Assistance Needed: caregiver helps with dressing and bathing Communication Communication: No difficulties Dominant Hand: Right         Vision/Perception     Cognition  Cognition Arousal/Alertness: Awake/alert Behavior During Therapy: WFL for tasks assessed/performed Overall Cognitive Status: Within Functional Limits for tasks assessed    Extremity/Trunk Assessment Upper Extremity Assessment Upper Extremity Assessment: Generalized weakness;RUE deficits/detail RUE Deficits / Details: previous shoulder surgery-AROM 90 degrees shoulder flexion, but can assist it to full range. ; LUE weak as well Lower Extremity Assessment Lower Extremity Assessment: Defer to PT evaluation     Mobility Bed Mobility Bed Mobility: Rolling Left;Left Sidelying to Sit;Sitting - Scoot to Edge of Bed Rolling Left: 4: Min assist Left Sidelying to Sit: 3: Mod assist Sitting - Scoot to Edge of Bed: 4: Min guard Details for Bed Mobility Assistance: Cues for technique. Assist to roll and to lift trunk. Transfers Transfers: Sit to Stand;Stand to Sit Sit to Stand: 1: +2 Total assist;Without upper extremity assist;From bed Sit to Stand: Patient Percentage: 50% Stand to Sit: 1: +2 Total assist;With upper extremity assist;To bed Stand to Sit: Patient Percentage: 60% Details for Transfer Assistance: Patient assisted to standing with use of RW and bilateral knee blocking, could not maintain for >30 seconds, patient assisted back to bed with VCs for hand placement.  One person total face to face stand pivot to chair again with bilateral knee blocking to prevent buckling.       Exercise        End of Session OT - End of Session Equipment Utilized During Treatment: Gait  belt;Rolling walker Activity Tolerance: Patient tolerated treatment well Patient left: in chair;with call bell/phone within reach  GO Functional Assessment Tool Used: clinical judgement Functional Limitation: Self care Self Care Current Status (K2706): At least 60 percent but less than 80 percent impaired, limited or restricted Self Care Goal Status (C3762): At least 40 percent but less than 60 percent impaired, limited or restricted   Earlie Raveling OTR/L [phone] 08/07/2013, 5:44 PM

## 2013-08-07 NOTE — Evaluation (Signed)
Physical Therapy Evaluation Patient Details Name: [name] MRN: [id number] DOB: [birth year] Today's Date: 08/07/2013 Time: 4098-1191 PT Time Calculation (min): 18 min  PT Assessment / Plan / Recommendation History of Present Illness  Pt is a pleasent 79 y.o. female s/p Left T11-T12 laminotomy and left T12 transpedicular microdiscectomy.  Clinical Impression  Patient demonstrates deficits in  Functional mobility as indicated below. Patient will benefit from continued skilled PT to address deficits and maximize function. Will continue to see as indicated and progress activity as tolerated. Rec ST SNF upon discharge to maximize function and decrease BOC.    PT Assessment  Patient needs continued PT services    Follow Up Recommendations  SNF       Barriers to Discharge Decreased caregiver support      Equipment Recommendations  None recommended by PT    Recommendations for Other Services     Frequency Min 5X/week    Precautions / Restrictions Precautions Precautions: Fall Restrictions Weight Bearing Restrictions: No   Pertinent Vitals/Pain Pain with movement in back region      Mobility  Bed Mobility Bed Mobility: Rolling Left;Left Sidelying to Sit;Sitting - Scoot to [place] of Bed Rolling Left: 4: Min guard Left Sidelying to Sit: 4: Min assist Sitting - Scoot to [place] of Bed: 4: Min guard Transfers Transfers: Sit to Stand;Stand to [employer] Transfers Sit to Stand: 1: +2 Total assist;Without upper extremity assist;From bed Sit to Stand: Patient Percentage: 50% Stand to Sit: 1: +2 Total assist;With upper extremity assist;To bed Stand to Sit: Patient Percentage: 60% Stand Pivot Transfers: 1: +1 Total assist Details for Transfer Assistance: Patient assisted to standing with use of RW and bilateral knee blocking, could not maintain for >30 seconds, patient assisted back to bed with VCs for hand placement.  One person total face to face stand pivot to chair again with  bilateral knee blocking to prevent buckling.   Ambulation/Gait Ambulation/Gait Assistance: Not tested (comment)        PT Diagnosis: Difficulty walking;Generalized weakness;Acute pain  PT Problem List: Decreased strength;Decreased range of motion;Decreased activity tolerance;Decreased balance;Decreased mobility;Pain PT Treatment Interventions: DME instruction;Gait training;Functional mobility training;Therapeutic activities;Therapeutic exercise;Balance training;Patient/family education     PT Goals(Current goals can be found in the care plan section) Acute Rehab PT Goals Patient Stated Goal: to be independent again PT Goal Formulation: With patient Time For Goal Achievement: 08/21/13 Potential to Achieve Goals: Fair  Visit Information  Last PT Received On: 08/07/13 Assistance Needed: +2 History of Present Illness: Pt is a pleasent 79 y.o. female s/p Left T11-T12 laminotomy and left T12 transpedicular microdiscectomy.       Prior Functioning  Home Living Family/patient expects to be discharged to:: Private residence Living Arrangements: Alone Available Help at Discharge: Personal care attendant (lives next door can help PRN as well) Type of Home: Apartment Home Access: Level entry Home Layout: One level Home Equipment: Environmental consultant - 2 wheels;Cane - single point;Tub bench;Wheelchair - power Prior Function Level of Independence: Needs assistance ADL's / Homemaking Assistance Needed: caregiver helps with dressing and bathing Dominant Hand: Right    Cognition  Cognition Arousal/Alertness: Awake/alert Behavior During Therapy: WFL for tasks assessed/performed Overall Cognitive Status: Within Functional Limits for tasks assessed    Extremity/Trunk Assessment Upper Extremity Assessment Upper Extremity Assessment: Defer to OT evaluation Lower Extremity Assessment Lower Extremity Assessment: RLE deficits/detail;LLE deficits/detail RLE Deficits / Details: generalized weakness <3/5  bilaterally  (RLE deficits > LLE) LLE Deficits / Details: generalized weakness <3/5 bilaterally  Balance Balance Balance Assessed: Yes Static Sitting Balance Static Sitting - Balance Support: Feet supported Static Sitting - Level of Assistance: 5: Stand by assistance Static Sitting - Comment/# of Minutes: 6 minutes  End of Session PT - End of Session Equipment Utilized During Treatment: Gait belt Activity Tolerance: Patient tolerated treatment well;Patient limited by pain Patient left: in chair;with call bell/phone within reach;Other (comment) (with OT) Nurse Communication: Mobility status  GP     Fabio Asa 08/07/2013, 12:13 PM Charlotte Crumb, PT DPT  [phone]

## 2013-08-07 NOTE — Progress Notes (Signed)
Talked to patient about DCP; patient lives alone with a caregiver (Serenity Earlene Plater) form [facility] that provides service for 3hrs a day Monday - Friday and 2.5hrs on Sat - Sunday - for a total of 80hrs per month; CM talked to the director of [facility] Debe Coder to verify information; Ula also informed me that patient did not want to be referred to [facility] because she would lose her in home aide; patient use a walker and wheelchair at home; Patient is agreeable to SNF at discharge if needed; physical / occupational therapy to work with patient today; Soc worker referral placed; B [employer].

## 2013-08-07 NOTE — Progress Notes (Signed)
UR completed/ sent for review

## 2013-08-07 NOTE — Progress Notes (Signed)
INITIAL NUTRITION ASSESSMENT  DOCUMENTATION CODES Per approved criteria  -Not Applicable   INTERVENTION:  Ensure Complete twice daily (350 kcals, 13 gm protein per 8 fl oz bottle) RD to follow for nutrition care plan  NUTRITION DIAGNOSIS: Increased nutrient needs related to wound, post-op healing as evidenced by estimated nutrition needs  Goal: Pt to meet >/= 90% of their estimated nutrition needs   Monitor:  PO & supplemental intake, weight, labs, I/O's  Reason for Assessment: Malnutrition Screening Tool Report  79 y.o. female  Admitting Dx: Degeneration of thoracic or thoracolumbar intervertebral disc  ASSESSMENT: Patient with PMH of HTN, CVA, chronic pain syndrome and lumbar spinal stenosis; presented with severe upper lumbar pain and bilateral lower extremity weakness progressing over the past few months; workup demonstrated evidence of a large thoracic disc herniation at T11-12 with marked spinal cord compression.   Patient s/p procedure 10/28: Left T11-T12 laminotomy and left T12 transpedicular microdiscectomy  Patient states her appetite is good; PO intake 100% per flowsheet records; reports weight stability; patient with Stage II pressure ulcer to buttocks -- would benefit from nutrition supplements; likes Ensure -- RD to order.  Height: Ht Readings from Last 1 Encounters:  07/05/13 5\' 2"  (1.575 m)    Weight: Wt Readings from Last 1 Encounters:  08/07/13 143 lb 3.2 oz (64.955 kg)    Ideal Body Weight: 110 lb  % Ideal Body Weight: 130%  Wt Readings from Last 10 Encounters:  08/07/13 143 lb 3.2 oz (64.955 kg)  08/07/13 143 lb 3.2 oz (64.955 kg)  07/05/13 122 lb 12.8 oz (55.702 kg)  05/15/13 134 lb (60.782 kg)  04/25/13 134 lb (60.782 kg)  04/22/13 132 lb 14.4 oz (60.283 kg)  04/09/13 138 lb 8 oz (62.823 kg)  03/25/13 138 lb 3.2 oz (62.687 kg)  02/18/13 130 lb 1.6 oz (59.013 kg)  02/07/13 135 lb 9.6 oz (61.508 kg)    Usual Body Weight: 134 lb  %  Usual Body Weight: 106%  BMI:  Body mass index is 26.18 kg/(m^2).  Estimated Nutritional Needs: Kcal: 1600-1800 Protein: 80-90 gm Fluid: 1.6-1.8 L  Skin: Stage II pressure ulcer to buttock  Diet Order: General  EDUCATION NEEDS: -No education needs identified at this time  Labs:   Recent Labs Lab 08/05/13 1327  NA 139  K 4.0  CL 104  CO2 26  BUN 16  CREATININE 0.89  CALCIUM 9.2  GLUCOSE 95    Scheduled Meds: . aspirin EC  81 mg Oral Daily  . ferrous sulfate  325 mg Oral BID  . furosemide  20 mg Oral Daily  . gabapentin  300 mg Oral QHS  . metoprolol tartrate  25 mg Oral BID  . pneumococcal 23 valent vaccine  0.5 mL Intramuscular Tomorrow-1000  . polyvinyl alcohol  1 drop Right Eye TID  . senna  1 tablet Oral BID  . sodium chloride  3 mL Intravenous Q12H  . timolol  1 drop Right Eye Daily    Continuous Infusions: . sodium chloride      Past Medical History  Diagnosis Date  . Hypertension   . Head ache   . Renal insufficiency, mild   . Depression   . Fecal occult blood test positive   . Restless leg syndrome   . Anemia   . Cataracts, bilateral   . Hemorrhoids   . Rupture of rotator cuff of shoulder     s/p repair by Dr. Luiz Blare 7/07. Follows with Dr. Ave Filter for possible  shoulder surgery.   . Lumbar spinal stenosis     gets Spinal injections by Dr. Callie Fielding   . Postnasal drip   . Atrial tachycardia     echo 04/08/11: EF 55-60%, mild LVH, grade 1 diast dysfxn;    s/p RFCA with Dr. Ladona Ridgel  04/09/11  . Hyperkalemia 04/08/2011  . Unspecified deficiency anemia   . Osteoporosis, unspecified 04/09/2013    DEXA 5/07 : T L forearm -2.9. L femur -0.6, R femur -1.4. [name]  . CVA (cerebral infarction) 04/09/2013    MRI 2005 : Multiple tiny old lacunar infarcts as well as scattered white matter small vessel ischemic disease. Pt asymptomatic and denies TIA / CVA hx.    . Chronic pain syndrome 04/09/2013    On chronic opioids. Cervical MRI 11/ 2012  : Diffuse cervical spondylosis, Diffuse degenerative disc disease with multilevel foraminal stenosis. Possible T1 nerve root involvement. Lumbar MRI 2009 : prior fusion at L3-4 and degenerative disc disease Rotator cuff repair : Dr. Luiz Blare 7/07. Follows with Dr. Ave Filter for possible shoulder surgery.  H/O lumbar spinal stenosis with steroid injections. Mo  . Arthritis     b/l shoulder R>L  . Rotator cuff tear arthropathy     03/22/13 dx [facility] (Dr. Ave Filter) considering surgery  . Stroke   . History of blood transfusion 2014    anemia-     Past Surgical History  Procedure Laterality Date  . Doppler echocardiography  2004  . Rotator cuff repair      right  . Back surgery      lumbar x 2, 2000 and 2004 by Dr. Lelon Perla.   . Eye surgery Bilateral     cataract  . Abdominal hysterectomy    . Orif finger fracture Left     ring finger    Maureen Chatters, RD, LDN Pager #: [phone] After-Hours Pager #: [phone]

## 2013-08-08 ENCOUNTER — Encounter: Payer: Medicare Other | Admitting: Internal Medicine

## 2013-08-08 NOTE — Progress Notes (Signed)
Postop day 2. Pain reasonably well controlled. Mobility still quite poor. Patient unable to ambulate with therapy yesterday. Strength remains improved from preoperative state but patient still very limited. I think she would benefit from additional day of inpatient therapy. Discharge plan still pending. Plan skilled nursing facility versus home with home therapy. Overall doing reasonably well.

## 2013-08-08 NOTE — Progress Notes (Signed)
Physical Therapy Treatment Patient Details Name: PARADISE VENSEL MRN: [id number] DOB: 1924-11-28 Today's Date: 08/08/2013 Time: 4098-1191 PT Time Calculation (min): 15 min  PT Assessment / Plan / Recommendation  History of Present Illness Pt is a pleasent 79 y.o. female s/p Left T11-T12 laminotomy and left T12 transpedicular microdiscectomy.   PT Comments   Patient not transferring as well today due to ? Increase in pain. Patient positioned in the recliner with pillows for comfort. Stated pain was a little better. Continue to recommend SNF for ongoing Physical Therapy.     Follow Up Recommendations  SNF     Does the patient have the potential to tolerate intense rehabilitation     Barriers to Discharge        Equipment Recommendations  None recommended by PT    Recommendations for Other Services    Frequency Min 5X/week   Progress towards PT Goals Progress towards PT goals: Not progressing toward goals - comment  Plan Current plan remains appropriate    Precautions / Restrictions Precautions Precautions: Fall;Back   Pertinent Vitals/Pain Patient moaned but did not rate pain   Mobility  Bed Mobility Rolling Left: 4: Min assist Left Sidelying to Sit: 3: Mod assist Sitting - Scoot to Edge of Bed: 4: Min assist Details for Bed Mobility Assistance: Cues for technique. Assist to roll and to lift trunk. Transfers Sit to Stand: 1: +2 Total assist;Without upper extremity assist;From bed Sit to Stand: Patient Percentage: 30% Stand to Sit: 1: +2 Total assist;With upper extremity assist;To bed Stand to Sit: Patient Percentage: 50% Stand Pivot Transfers: 1: +2 Total assist Stand Pivot Transfers: Patient Percentage: 30% Details for Transfer Assistance: A with initiation. Patient able to stand as well this session and required +2 assist to recliner. Unable to stand fully upright for any period of time.  Ambulation/Gait Ambulation/Gait Assistance: Not tested (comment)    Exercises      PT Diagnosis:    PT Problem List:   PT Treatment Interventions:     PT Goals (current goals can now be found in the care plan section)    Visit Information  Last PT Received On: 08/08/13 Assistance Needed: +2 History of Present Illness: Pt is a pleasent 79 y.o. female s/p Left T11-T12 laminotomy and left T12 transpedicular microdiscectomy.    Subjective Data      Cognition  Cognition Arousal/Alertness: Awake/alert Behavior During Therapy: WFL for tasks assessed/performed Overall Cognitive Status: Within Functional Limits for tasks assessed    Balance     End of Session PT - End of Session Equipment Utilized During Treatment: Gait belt Activity Tolerance: Patient limited by fatigue;Patient limited by pain Patient left: in chair;with call bell/phone within reach;Other (comment) Nurse Communication: Mobility status   GP     Fredrich Birks 08/08/2013, 1:13 PM  08/08/2013 Fredrich Birks PTA [phone] pager [phone] office

## 2013-08-08 NOTE — Progress Notes (Signed)
Patient's status approved for inpatient status per Medicare Reviewer/ Med Mgt; updated given to Soc Worker to proceed with skilled nursing facility placement as patient requested; B Ave Filter RN,BSN,MHA

## 2013-08-09 NOTE — Progress Notes (Signed)
Seen and agreed 08/09/2013 Robinette, Julia Elizabeth PTA [phone] pager [phone] office    

## 2013-08-09 NOTE — Progress Notes (Signed)
Postop day 3. Patient still complains of back pain. Still unable to stand or walk any appreciable distance. She does state that her legs to feel better than preop however.  Afebrile. Vitals are stable. Wound clean and dry. Chest and abdomen benign.  Progressing slowly. I agree the patient will be best served by placement into a skilled nursing facility for further convalescence. Continue rehabilitation efforts.

## 2013-08-09 NOTE — Progress Notes (Signed)
Physical Therapy Treatment Patient Details Name: [name] MRN: [id number] DOB: [birth year] Today's Date: 08/09/2013 Time: 4098-1191 PT Time Calculation (min): 23 min  PT Assessment / Plan / Recommendation  History of Present Illness Pt is a pleasent 79 y.o. female s/p Left T11-T12 laminotomy and left T12 transpedicular microdiscectomy.   PT Comments   Pt transferred from bed to recliner to 3n1 and back to recliner.  Pt continues to be limited with all mobility.  Continue to recommend SNF.  Follow Up Recommendations  SNF     Does the patient have the potential to tolerate intense rehabilitation     Barriers to Discharge        Equipment Recommendations  None recommended by PT    Recommendations for Other Services    Frequency Min 5X/week   Progress towards PT Goals Progress towards PT goals: Progressing toward goals  Plan Current plan remains appropriate    Precautions / Restrictions Precautions Precautions: Fall;Back Restrictions Weight Bearing Restrictions: No   Pertinent Vitals/Pain Pt reported pain but not a specific number.    Mobility  Bed Mobility Bed Mobility: Rolling Left;Left Sidelying to Sit;Sitting - Scoot to Edge of Bed Rolling Left: 4: Min assist;With rail Left Sidelying to Sit: 4: Min assist;With rails Sitting - Scoot to [place] of Bed: 4: Min assist Details for Bed Mobility Assistance: Cues for technique. Assist to roll and to lift trunk. Transfers Sit to Stand: 1: +2 Total assist;Without upper extremity assist;From bed Sit to Stand: Patient Percentage: 10% Stand to Sit: 1: +2 Total assist;With upper extremity assist;To bed Stand to Sit: Patient Percentage: 10% Stand Pivot Transfers: 1: +2 Total assist Stand Pivot Transfers: Patient Percentage: 10% Ambulation/Gait Ambulation/Gait Assistance: Not tested (comment) Stairs: No Wheelchair Mobility Wheelchair Mobility: No    Exercises     PT Diagnosis:    PT Problem List:   PT Treatment  Interventions:     PT Goals (current goals can now be found in the care plan section)    Visit Information  Last PT Received On: 08/09/13 Assistance Needed: +2 History of Present Illness: Pt is a pleasent 79 y.o. female s/p Left T11-T12 laminotomy and left T12 transpedicular microdiscectomy.    Subjective Data      Cognition  Cognition Arousal/Alertness: Awake/alert Behavior During Therapy: WFL for tasks assessed/performed Overall Cognitive Status: Within Functional Limits for tasks assessed    Balance     End of Session PT - End of Session Equipment Utilized During Treatment: Gait belt Activity Tolerance: Patient limited by fatigue;Patient limited by pain Patient left: in chair;with call bell/phone within reach;Other (comment) Nurse Communication: Mobility status   GP     MinnickIrving Burton, SPTA 08/09/2013, 10:07 AM

## 2013-08-10 NOTE — Progress Notes (Signed)
Pt stable -  Says legs weak   Temp:  [98.2 F (36.8 C)-101 F (38.3 C)] 98.3 F (36.8 C) (11/01 0640) Pulse Rate:  [73-82] 74 (11/01 0640) Resp:  [18] 18 (11/01 0640) BP: (106-165)/(60-82) 110/72 mmHg (11/01 0640) SpO2:  [96 %-99 %] 98 % (11/01 0640) Neuro - unchanges Incision CDI  Plan: CPM , await SNF

## 2013-08-11 NOTE — Progress Notes (Signed)
Clinical Social Work Department CLINICAL SOCIAL WORK PLACEMENT NOTE 08/11/2013  Patient:  [name], [name]  Account Number:  [id number] Admit date:  08/06/2013  Clinical Social Worker:  Samuella Bruin, Theresia Majors  Date/time:  08/11/2013 03:17 PM  Clinical Social Work is seeking post-discharge placement for this patient at the following level of care:   SKILLED NURSING   (*CSW will update this form in Epic as items are completed)   08/11/2013  Patient/family provided with [facility] Department of Clinical Social Work's list of facilities offering this level of care within the geographic area requested by the patient (or if unable, by the patient's family).  08/11/2013  Patient/family informed of their freedom to choose among providers that offer the needed level of care, that participate in Medicare, Medicaid or managed care program needed by the patient, have an available bed and are willing to accept the patient.    Patient/family informed of [facility]' ownership interest in [facility], as well as of the fact that they are under no obligation to receive care at this facility.  PASARR submitted to EDS on 08/09/2013 PASARR number received from EDS on 08/09/2013  FL2 transmitted to all facilities in geographic area requested by pt/family on  08/11/2013 FL2 transmitted to all facilities within larger geographic area on   Patient informed that his/her managed care company has contracts with or will negotiate with  certain facilities, including the following:     Patient/family informed of bed offers received:   Patient chooses bed at  Physician recommends and patient chooses bed at    Patient to be transferred to  on   Patient to be transferred to facility by   The following physician request were entered in Epic:  Samuella Bruin, MSW, LCSWA Clinical Social Worker [facility] Emergency Dept. [phone]    Additional Comments:

## 2013-08-11 NOTE — Progress Notes (Signed)
Clinical Social Work Department BRIEF PSYCHOSOCIAL ASSESSMENT 08/11/2013  Patient:  [name], [name]     Account Number:  [id number]     Admit date:  08/06/2013  Clinical Social Worker:  Hadley Pen  Date/Time:  08/11/2013 02:51 PM  Referred by:  Physician  Date Referred:  08/11/2013 Referred for  SNF Placement   Other Referral:   Interview type:  Patient Other interview type:    PSYCHOSOCIAL DATA Living Status:  ALONE Admitted from facility:   Level of care:   Primary support name:  Yamilka Lopiccolo [phone] Primary support relationship to patient:  CHILD, ADULT Degree of support available:   Good according to patient    CURRENT CONCERNS Current Concerns  Post-Acute Placement   Other Concerns:    SOCIAL WORK ASSESSMENT / PLAN Weekend CSW received referral for SNF placement for patient. CSW met with patient to discuss discharge plans. CSW introduced herself and explained role. Patient was agreeable to speak. CSW informed patient that PT was recommending SNF. Patient stated that she wants to "walk again" and that she would be agreeable to SNF for short term rehab. Patient reports that she currently lives alone but has a son Dee Paden that lives nearby. CSW received consent to contact son Maisie Fus to inform him of discussion. CSW explained process of SNF placement to patient, she denies having any questions at this time. Weekday CSW to follow for discharge planning.    Weekend CSW attempted to contact son on home telephone number and left voicemail.   Assessment/plan status:  Information/Referral to [facility] Other assessment/ plan:   Information/referral to community resources:   Weekend CSW provided patient with SNF list.    PATIENT'S/FAMILY'S RESPONSE TO PLAN OF CARE: Patient is agreeable to SNF placement.     Samuella Bruin, MSW, LCSWA Clinical Social Worker [facility] Emergency [facility]. [phone]

## 2013-08-11 NOTE — Progress Notes (Signed)
Pt reports " not good"  -  But no new c/o , no change  Temp:  [98.2 F (36.8 C)-99.8 F (37.7 C)] 99.5 F (37.5 C) (11/02 1030) Pulse Rate:  [64-96] 79 (11/02 1030) Resp:  [18] 18 (11/02 1030) BP: (100-143)/(50-89) 104/59 mmHg (11/02 1030) SpO2:  [97 %-100 %] 97 % (11/02 1030) Neuro stable  Plan: SNF

## 2013-08-12 MED ORDER — HYDROCODONE-ACETAMINOPHEN 5-325 MG PO TABS: 1.0000 | ORAL_TABLET | ORAL | Status: DC | PRN

## 2013-08-12 NOTE — Progress Notes (Signed)
Physical Therapy Treatment Patient Details Name: [name] MRN: [id number] DOB: 1925-07-30 Today's Date: 08/12/2013 Time:  -     PT Assessment / Plan / Recommendation  History of Present Illness Pt is a pleasent 79 y.o. female s/p Left T11-T12 laminotomy and left T12 transpedicular microdiscectomy.   PT Comments   Patient performed some there ex EOB with maximal effort and limited ROM. Patient assisted to chair via face to face pivot transfer. Pt continues to demonstrate limited mobility and generalized weakness. Will continue to see as indicated to facilitate dc to SNF for rehab.   Follow Up Recommendations  SNF           Equipment Recommendations  None recommended by PT       Frequency Min 5X/week   Progress towards PT Goals Progress towards PT goals: Not progressing toward goals - comment  Plan Current plan remains appropriate    Precautions / Restrictions Precautions Precautions: Fall;Back   Pertinent Vitals/Pain Patient reports some pain prior to session but has been pre medicated she reports     Mobility  Bed Mobility Bed Mobility: Rolling Left;Left Sidelying to Sit;Sitting - Scoot to Edge of Bed Rolling Left: 4: Min assist;With rail Left Sidelying to Sit: 4: Min assist;With rails Sitting - Scoot to [place] of Bed: 4: Min assist Details for Bed Mobility Assistance: Cues for technique. Assist to roll and to lift trunk. Transfers Transfers: Sit to Stand;Stand to [employer] Transfers Sit to Stand: 2: Max assist Stand to Sit: 2: Max assist Stand Pivot Transfers: 1: +1 Total assist Details for Transfer Assistance: Face to face pivot transfer to chair Ambulation/Gait Ambulation/Gait Assistance: Not tested (comment) Stairs: No    Exercises General Exercises - Lower Extremity Ankle Circles/Pumps: AROM;Both;10 reps Long Arc Quad: AROM;Both;10 reps (very limited ROM bilaterally) Hip Flexion/Marching: AROM;Both;10 reps (very limited ROM bilaterally, maximal  effort)     PT Goals (current goals can now be found in the care plan section) Acute Rehab PT Goals PT Goal Formulation: With patient Time For Goal Achievement: 08/21/13 Potential to Achieve Goals: Fair  Visit Information  Last PT Received On: 08/12/13 Assistance Needed: +2 History of Present Illness: Pt is a pleasent 79 y.o. female s/p Left T11-T12 laminotomy and left T12 transpedicular microdiscectomy.    Subjective Data   I had some pain but then I had a pill and it is better now   Cognition  Cognition Arousal/Alertness: Awake/alert Behavior During Therapy: WFL for tasks assessed/performed Overall Cognitive Status: Within Functional Limits for tasks assessed    Balance  Static Sitting Balance Static Sitting - Balance Support: Feet supported Static Sitting - Level of Assistance: 5: Stand by assistance Static Sitting - Comment/# of Minutes: 5 minutes performing ther ex  End of Session PT - End of Session Equipment Utilized During Treatment: Gait belt Activity Tolerance: Patient limited by fatigue;Patient limited by pain Patient left: in chair;with call bell/phone within reach;with chair alarm set Nurse Communication: Mobility status   GP     Fabio Asa 08/12/2013, 9:36 AM Charlotte Crumb, PT DPT  [phone]

## 2013-08-12 NOTE — Discharge Summary (Signed)
Physician Discharge Summary  Patient ID: [name] MRN: [id number] DOB/AGE: 1924-11-19 79 y.o.  Admit date: 08/06/2013 Discharge date: 08/12/2013  Admission Diagnoses:  Discharge Diagnoses:  Principal Problem:   Degeneration of thoracic or thoracolumbar intervertebral disc   Discharged Condition: fair  Hospital Course: Patient admitted to the hospital where she underwent an uncomplicated left-sided T11-T12 transpedicular microdiscectomy. Postoperatively she has had improvement in her lower thoracic pain and pain into her lower extremities. However she still has significant paraparesis which is improved from preop but still limits her from standing or ambulating. The patient is progressing very slowly with physical therapy. It is felt that she would be best served by admission into a skilled nursing facility for continued physical therapy and convalescence. Patient is stable for discharge to skilled nursing facility.  Consults:   Significant Diagnostic Studies:   Treatments:   Discharge Exam: Blood pressure 110/63, pulse 71, temperature 98.1 F (36.7 C), temperature source Oral, resp. rate 18, height 5\' 2"  (1.575 m), weight 64.955 kg (143 lb 3.2 oz), SpO2 99.00%. Awake and alert. Oriented and appropriate. Motor examination reveals intact strength in bilateral upper extremities. Lower extremity motor function and demonstrates mild proximal weakness in both lower extremities. Patient has near normal distal lower extremity strength. Sensory examination reveals a very minimal relative sensory level around T12. Wound is healing well. Chest and abdomen are benign.  Disposition: 01-Home or Self Care   Future Appointments Provider Department Dept Phone   10/11/2013 3:00 PM [name] [facility] MEDICAL ONCOLOGY [phone]   01/03/2014 2:00 PM [name] [facility] MEDICAL ONCOLOGY [phone]   03/27/2014 2:30 PM [name] [facility] MEDICAL ONCOLOGY [phone]   03/27/2014 3:00 PM Tammy Delay, MD  [facility] MEDICAL ONCOLOGY [phone]       Medication List         aspirin EC 81 MG tablet  Take 81 mg by mouth daily.     carboxymethylcellulose 0.5 % Soln  Commonly known as:  REFRESH PLUS  Place 1 drop into the right eye 3 (three) times daily.     [employer] HYDROCOLLOID 2"X2" Pads  Apply 1 each topically every 3 (three) days.     diclofenac sodium 1 % Gel  Commonly known as:  VOLTAREN  Apply topically 4 grams/4  times daily as needed for pain     ferrous sulfate 325 (65 FE) MG tablet  Take 325 mg by mouth 2 (two) times daily.     furosemide 20 MG tablet  Commonly known as:  LASIX  Take 1 tablet (20 mg total) by mouth daily.     gabapentin 100 MG capsule  Commonly known as:  NEURONTIN  Take 1 capsule by mouth at  bedtime     HYDROcodone-acetaminophen 5-325 MG per tablet  Commonly known as:  NORCO/VICODIN  Take 1-2 tablets by mouth every 4 (four) hours as needed.     metoprolol tartrate 25 MG tablet  Commonly known as:  LOPRESSOR  Take 1 tablet (25 mg total) by mouth 2 (two) times daily.     MIRALAX powder  Generic drug:  polyethylene glycol powder  Take 17 g by mouth 2 (two) times daily as needed. For constipation     timolol 0.5 % ophthalmic solution  Commonly known as:  TIMOPTIC  Place 1 drop into the right eye daily.           Follow-up Information   Follow  up with Ajanay Farve A, MD. Schedule an appointment as soon as possible for a visit in 2 weeks.   Specialty:  Neurosurgery   Contact information:   [address] [place] [address] [phone]       Signed: Chauncy Mangiaracina A 08/12/2013, 10:52 AM

## 2013-08-13 ENCOUNTER — Encounter: Payer: Self-pay | Admitting: Adult Health

## 2013-08-13 ENCOUNTER — Non-Acute Institutional Stay (SKILLED_NURSING_FACILITY): Payer: Medicare Other | Admitting: Adult Health

## 2013-08-13 DIAGNOSIS — I10 Essential (primary) hypertension: Secondary | ICD-10-CM

## 2013-08-13 DIAGNOSIS — R69 Illness, unspecified: Secondary | ICD-10-CM

## 2013-08-13 DIAGNOSIS — Z7409 Other reduced mobility: Secondary | ICD-10-CM

## 2013-08-13 DIAGNOSIS — M810 Age-related osteoporosis without current pathological fracture: Secondary | ICD-10-CM

## 2013-08-13 DIAGNOSIS — IMO0002 Reserved for concepts with insufficient information to code with codable children: Secondary | ICD-10-CM

## 2013-08-13 DIAGNOSIS — G894 Chronic pain syndrome: Secondary | ICD-10-CM

## 2013-08-13 NOTE — Progress Notes (Signed)
Patient ID: [name], female   DOB: 1924-12-10, 79 y.o.   MRN: [id number] Provider:  Gwenith Spitz. Renato Gails, D.O., C.M.D.  Location:  [facility]   PCP: Annett Gula, MD  Code Status: Full Code  Allergies  Allergen Reactions  . Codeine     Hallucinations  . Ibuprofen     Confusion    Chief Complaint  Patient presents with  . Hospitalization Follow-up    back surgery    HPI: 79 y.o. female is a new admission to [facility] after recent back surgery. Pt. Was having paraparesis and was seeing the neurosurgeon Dr. Jordan Likes. 10/28 she had a uncomplicated T11-T12 transpedicular microdiscectomy. Prior to surgery pt. Reports she could not walk or stand. Had fallen trying to get out of bed and suffered an abrasion to left knee.  After surgery her paraparesis has improved slightly but pt continues to have  trouble standing and ambulating. Pt was referred here for further physical therapy.    ROS: Review of Systems  Constitutional: Negative for weight loss.       Bilateral LE  Respiratory: Negative for shortness of breath.   Cardiovascular: Negative for chest pain.  Gastrointestinal: Positive for constipation. Negative for nausea, vomiting and diarrhea.  Genitourinary: Negative for dysuria, urgency and frequency.  Musculoskeletal: Positive for falls.       At home prior to hospitalization   Neurological: Positive for weakness. Negative for dizziness and sensory change.     Past Medical History  Diagnosis Date  . Hypertension   . Head ache   . Renal insufficiency, mild   . Depression   . Fecal occult blood test positive   . Restless leg syndrome   . Anemia   . Cataracts, bilateral   . Hemorrhoids   . Rupture of rotator cuff of shoulder     s/p repair by Dr. Luiz Blare 7/07. Follows with Dr. Ave Filter for possible shoulder surgery.   . Lumbar spinal stenosis     gets Spinal injections by Dr. Callie Fielding   . Postnasal drip   . Atrial tachycardia     echo  04/08/11: EF 55-60%, mild LVH, grade 1 diast dysfxn;    s/p RFCA with Dr. Ladona Ridgel  04/09/11  . Hyperkalemia 04/08/2011  . Unspecified deficiency anemia   . Osteoporosis, unspecified 04/09/2013    DEXA 5/07 : T L forearm -2.9. L femur -0.6, R femur -1.4. [name]  . CVA (cerebral infarction) 04/09/2013    MRI 2005 : Multiple tiny old lacunar infarcts as well as scattered white matter small vessel ischemic disease. Pt asymptomatic and denies TIA / CVA hx.    . Chronic pain syndrome 04/09/2013    On chronic opioids. Cervical MRI 11/ 2012 : Diffuse cervical spondylosis, Diffuse degenerative disc disease with multilevel foraminal stenosis. Possible T1 nerve root involvement. Lumbar MRI 2009 : prior fusion at L3-4 and degenerative disc disease Rotator cuff repair : Dr. Luiz Blare 7/07. Follows with Dr. Ave Filter for possible shoulder surgery.  H/O lumbar spinal stenosis with steroid injections. Mo  . Arthritis     b/l shoulder R>L  . Rotator cuff tear arthropathy     03/22/13 dx [facility] (Dr. Ave Filter) considering surgery  . Stroke   . History of blood transfusion 2014    anemia-    Past Surgical History  Procedure Laterality Date  . Doppler echocardiography  2004  . Rotator cuff repair      right  .  Back surgery      lumbar x 2, 2000 and 2004 by Dr. Lelon Perla.   . Eye surgery Bilateral     cataract  . Abdominal hysterectomy    . Orif finger fracture Left     ring finger  . Lumbar laminectomy/decompression microdiscectomy Left 08/06/2013    Procedure: Left Thoracic eleven-twelve microdiskectomy ;  Surgeon: Temple Pacini, MD;  Location: [facility];  Service: Neurosurgery;  Laterality: Left;  Left Thoracic eleven-twelve microdiskectomy    Social History:   reports that she has never smoked. She does not have any smokeless tobacco history on file. She reports that she does not drink alcohol or use illicit drugs.  Family History  Problem Relation Age of Onset  . Coronary  artery disease Neg Hx     premature    Medications: Patient's Medications  New Prescriptions   No medications on file  Previous Medications   ASPIRIN EC 81 MG TABLET    Take 81 mg by mouth daily.   CARBOXYMETHYLCELLULOSE (REFRESH PLUS) 0.5 % SOLN    Place 1 drop into the right eye 3 (three) times daily.    DICLOFENAC SODIUM (VOLTAREN) 1 % GEL    Apply topically 4 grams/4  times daily as needed for pain   FERROUS SULFATE 325 (65 FE) MG TABLET    Take 325 mg by mouth 2 (two) times daily.   FUROSEMIDE (LASIX) 20 MG TABLET    Take 1 tablet (20 mg total) by mouth daily.   GABAPENTIN (NEURONTIN) 100 MG CAPSULE    Take 1 capsule by mouth at  bedtime   HYDROCODONE-ACETAMINOPHEN (NORCO/VICODIN) 5-325 MG PER TABLET    Take 1-2 tablets by mouth every 4 (four) hours as needed.   METOPROLOL TARTRATE (LOPRESSOR) 25 MG TABLET    Take 1 tablet (25 mg total) by mouth 2 (two) times daily.   POLYETHYLENE GLYCOL POWDER (MIRALAX) POWDER    Take 17 g by mouth 2 (two) times daily as needed. For constipation   TIMOLOL (TIMOPTIC) 0.5 % OPHTHALMIC SOLUTION    Place 1 drop into the right eye daily.    WOUND DRESSINGS ([employer]S HYDROCOLLOID 2"X2") PADS    Apply 1 each topically every 3 (three) days.  Modified Medications   No medications on file  Discontinued Medications   No medications on file     Physical Exam: Filed Vitals:   08/13/13 1007  BP: 111/66  Pulse: 81  Temp: 97.7 F (36.5 C)  Resp: 18  SpO2: 96%   Physical Exam  Constitutional: She appears well-developed and well-nourished.  Cardiovascular: Normal rate, regular rhythm, normal heart sounds and intact distal pulses.   Pulmonary/Chest: Effort normal and breath sounds normal.  Abdominal: Soft. Bowel sounds are normal.  Musculoskeletal:       Right knee: She exhibits decreased range of motion.       Left knee: She exhibits decreased range of motion and swelling.       Right ankle: She exhibits decreased range of motion.       Left ankle: She  exhibits decreased range of motion.  Pt. Can only move her toes. Pt. Is unable to lift both right and left leg off the bed. Unable to bend bilateral knees. Muscle atrophy in the lower legs. Abrasion below left knee. Scar down then left anterior leg (shin).  Neurological: She is alert.  Skin: Skin is warm and dry.  thoracolumbar dressing is clean, dry and intact. Site is tender to  palpation.   Psychiatric: She has a normal mood and affect.     Labs reviewed: Basic Metabolic Panel:  Recent Labs  19/14/78 1407 03/25/13 1107 07/18/13 1403 08/05/13 1327  NA 146* 144 144 139  K 4.0 3.5 3.5 4.0  CL 110 107  --  104  CO2 28 28 26 26   GLUCOSE 63* 83 74 95  BUN 11 12 13.3 16  CREATININE 0.79 0.95 0.8 0.89  CALCIUM 8.7 8.4 8.5 9.2   Liver Function Tests:  Recent Labs  09/17/12 1525 09/27/12 1142 02/08/13 0350  AST 49* 55* 74*  ALT 38* 37 30  ALKPHOS 64 95 106  BILITOT 0.3 0.41 0.6  PROT 5.9* 5.6* 6.5  ALBUMIN 2.8* 2.2* 3.1*   CBC:  Recent Labs  04/25/13 1013 07/18/13 1402 08/05/13 1327  WBC 7.7 6.4 7.1  NEUTROABS 3.7 2.7 3.4  HGB 11.7 12.8 14.5  HCT 35.7 38.3 41.1  MCV 96.5 93.6 93.8  PLT 189 152 207    Imaging and Procedures: 07/23/13: MRI THORACIC SPINE: 1. Large soft disk extrusion at T11-12 severely compressing the spinal cord with secondary myelopathy. 2. Moderate compression of the spinal cord at T10-11 due to abroad-based disc protrusion. Broad-based disk protrusion at T12-L1 asymmetric to the left with compression of the thecal sac and left lateral recess.  08/05/13: Chest x-ray: 1. No radiographic evidence of acute cardiopulmonary disease. 2. Atherosclerosis and tortuosity of the thoracic aorta.  08/06/13: THORACOLUMBAR SPINE - 2 VIEW:  The localization instrument is posterior to the T11-T12 disc space.  Assessment/Plan 1. HYPERTENSION - Controlled  - Metoprolol 25mg  PO daily  2. Osteoporosis, unspecified - Start Vit D 2000u daily  3. Degeneration  of thoracic or thoracolumbar intervertebral disc - paraparesis in bilateral lower extremities still  Present after surgery.  - Ongoing tenderness and pain in Thoracic region - Physical therapy to assess and develop a plan of care.  -don't anticipate she will be ambulating independently in the near future based on her severe atrophy of her lower extremities  4. Impaired mobility - paraparesis of bilateral lower extremities  - See #3  5. Chronic pain syndrome - Hydrocodone-acetaminophen 5-325 mg PO 1-2 tablets q4h - Patient rating pain 10/10 related to prior thoracic surgery - Encouraging nursing staff to assess patient pain regularly and give medication as needed.   Functional status: dependent in bathing, dressing due to back surgery, unable to transfer or ambulate independently  Family/ staff Communication: unit supervisor present for visito  Labs/tests ordered: f/u with Dr. Jordan Likes in 2 weeks, cbc, bmp

## 2013-08-14 NOTE — Addendum Note (Signed)
Addended by: Neomia Dear on: 08/14/2013 06:55 PM   Modules accepted: Orders

## 2013-08-20 ENCOUNTER — Non-Acute Institutional Stay: Payer: Self-pay | Admitting: Adult Health

## 2013-08-20 NOTE — Progress Notes (Unsigned)
This encounter was created in error - please disregard.

## 2013-08-23 ENCOUNTER — Encounter (HOSPITAL_COMMUNITY): Payer: Self-pay | Admitting: Neurosurgery

## 2013-08-23 NOTE — OR Nursing (Signed)
Procedure end time added to OR record as late entry.

## 2013-09-03 ENCOUNTER — Encounter: Payer: Medicare Other | Admitting: Internal Medicine

## 2013-09-12 ENCOUNTER — Telehealth: Payer: Self-pay | Admitting: *Deleted

## 2013-09-12 NOTE — Telephone Encounter (Signed)
Arloa Koh, Nursing Director at [facility], states pt in Rehab s/p back surgery.  They are working w/ pt for pain control and asking if pt has any history of cancer they should be aware of prior to initiating any modalities for pain management.  Informed her that pt does not have any history of cancer documented here.  Pt not being seen or treated for cancer here,  Being seen for anemia.  She verbalized understanding.

## 2013-09-23 ENCOUNTER — Non-Acute Institutional Stay (SKILLED_NURSING_FACILITY): Payer: Medicare Other | Admitting: Internal Medicine

## 2013-09-23 ENCOUNTER — Encounter: Payer: Self-pay | Admitting: Internal Medicine

## 2013-09-23 DIAGNOSIS — G609 Hereditary and idiopathic neuropathy, unspecified: Secondary | ICD-10-CM

## 2013-09-23 DIAGNOSIS — K5900 Constipation, unspecified: Secondary | ICD-10-CM

## 2013-09-23 DIAGNOSIS — G894 Chronic pain syndrome: Secondary | ICD-10-CM

## 2013-09-23 DIAGNOSIS — D649 Anemia, unspecified: Secondary | ICD-10-CM

## 2013-09-23 DIAGNOSIS — G629 Polyneuropathy, unspecified: Secondary | ICD-10-CM

## 2013-09-23 NOTE — Progress Notes (Signed)
Patient ID: [name], female   DOB: 12-01-1924, 79 y.o.   MRN: [id number]    [name] living [employer]  Chief Complaint  Patient presents with  . Medical Managment of Chronic Issues   HPI 79 y/o female pt here for long term care. She is having constipation. miralax is not helping her. No other complaints. No new concern from staff  Review of Systems  Constitutional: Negative for fever, chills, weight loss, malaise/fatigue and diaphoresis.  HENT: Negative for congestion, hearing loss and sore throat.   Eyes: Negative for blurred vision, double vision and discharge.  Respiratory: Negative for cough, sputum production, shortness of breath and wheezing.   Cardiovascular: Negative for chest pain, palpitations, orthopnea  Gastrointestinal: Negative for heartburn, nausea, vomiting, abdominal pain  Genitourinary: Negative for dysuria, urgency, frequency and flank pain.  Musculoskeletal: Negative for back pain, falls, joint pain and myalgias.  Skin: Negative for itching and rash.  Neurological: Negative for dizziness, tingling, focal weakness and headaches.  Psychiatric/Behavioral: Negative for depression and memory loss. The patient is not nervous/anxious.    Past Medical History  Diagnosis Date  . Hypertension   . Head ache   . Renal insufficiency, mild   . Depression   . Fecal occult blood test positive   . Restless leg syndrome   . Anemia   . Cataracts, bilateral   . Hemorrhoids   . Rupture of rotator cuff of shoulder     s/p repair by Dr. Luiz Blare 7/07. Follows with Dr. Ave Filter for possible shoulder surgery.   . Lumbar spinal stenosis     gets Spinal injections by Dr. Callie Fielding   . Postnasal drip   . Atrial tachycardia     echo 04/08/11: EF 55-60%, mild LVH, grade 1 diast dysfxn;    s/p RFCA with Dr. Ladona Ridgel  04/09/11  . Hyperkalemia 04/08/2011  . Unspecified deficiency anemia   . Osteoporosis, unspecified 04/09/2013    DEXA 5/07 : T L forearm -2.9. L femur -0.6, R femur  -1.4. [name]  . CVA (cerebral infarction) 04/09/2013    MRI 2005 : Multiple tiny old lacunar infarcts as well as scattered white matter small vessel ischemic disease. Pt asymptomatic and denies TIA / CVA hx.    . Chronic pain syndrome 04/09/2013    On chronic opioids. Cervical MRI 11/ 2012 : Diffuse cervical spondylosis, Diffuse degenerative disc disease with multilevel foraminal stenosis. Possible T1 nerve root involvement. Lumbar MRI 2009 : prior fusion at L3-4 and degenerative disc disease Rotator cuff repair : Dr. Luiz Blare 7/07. Follows with Dr. Ave Filter for possible shoulder surgery.  H/O lumbar spinal stenosis with steroid injections. Mo  . Arthritis     b/l shoulder R>L  . Rotator cuff tear arthropathy     03/22/13 dx [facility] (Dr. Ave Filter) considering surgery  . Stroke   . History of blood transfusion 2014    anemia-    Past Surgical History  Procedure Laterality Date  . Doppler echocardiography  2004  . Rotator cuff repair      right  . Back surgery      lumbar x 2, 2000 and 2004 by Dr. Lelon Perla.   . Eye surgery Bilateral     cataract  . Abdominal hysterectomy    . Orif finger fracture Left     ring finger  . Lumbar laminectomy/decompression microdiscectomy Left 08/06/2013    Procedure: Left Thoracic eleven-twelve microdiskectomy ;  Surgeon: Temple Pacini, MD;  Location:  [facility];  Service: Neurosurgery;  Laterality: Left;  Left Thoracic eleven-twelve microdiskectomy    Medication reviewed. See MAR  Physical exam BP 107/60  Pulse 60  Temp(Src) 98 F (36.7 C)  Resp 18  Ht 5\' 2"  (1.575 m)  Wt 132 lb (59.875 kg)  BMI 24.14 kg/m2  SpO2 96%  General- elderly female in no acute distress Head- atraumatic, normocephalic Eyes- PERRLA, EOMI, no pallor, no icterus Neck- no lymphadenopathy, no thyromegaly, no jugular vein distension, no carotid bruit Cardiovascular- normal s1,s2, no murmurs/ rubs/ gallops Respiratory- bilateral clear to  auscultation, no wheeze, no rhonchi, no crackles Abdomen- bowel sounds present, soft, non tender Musculoskeletal- lower extremity weakness, on a wheelchair Neurological- no focal deficit, normal reflexes, normal muscle strength, normal sensation to fine touch and vibration Psychiatry- alert and oriented to person, place and time, normal mood and affect  Labs- 08/14/13 wbc 7.9, hb 11.3, hct 33.2, plt 243, na 141, k 4, glu 74, bun 32, cr 1.01, ca 8.3  CBC    Component Value Date/Time   WBC 7.1 08/05/2013 1327   WBC 6.4 07/18/2013 1402   RBC 4.38 08/05/2013 1327   RBC 4.09 07/18/2013 1402   RBC 2.71* 01/11/2011 1205   HGB 14.5 08/05/2013 1327   HGB 12.8 07/18/2013 1402   HCT 41.1 08/05/2013 1327   HCT 38.3 07/18/2013 1402   PLT 207 08/05/2013 1327   PLT 152 07/18/2013 1402   MCV 93.8 08/05/2013 1327   MCV 93.6 07/18/2013 1402   MCH 33.1 08/05/2013 1327   MCH 31.3 07/18/2013 1402   MCHC 35.3 08/05/2013 1327   MCHC 33.4 07/18/2013 1402   RDW 14.1 08/05/2013 1327   RDW 14.7* 07/18/2013 1402   LYMPHSABS 2.8 08/05/2013 1327   LYMPHSABS 3.0 07/18/2013 1402   MONOABS 0.9 08/05/2013 1327   MONOABS 0.7 07/18/2013 1402   EOSABS 0.1 08/05/2013 1327   EOSABS 0.1 07/18/2013 1402   BASOSABS 0.0 08/05/2013 1327   BASOSABS 0.0 07/18/2013 1402    Assessment/plan  Peripheral neuropathy worsened Change neurontin to 100 mg tid for now Monitor  Chronic pain Continue her hydrocodone-apap and monitor  constipation On miralax 17 g po bid without much help. Pt has difficulty with defecation and stool is firm. She lacks sense of bowel evacuation. Will d/c miralax and have her on linzess 145 mcg daily half an hour prior to first meal of the day and reassess   Anemia Continue iron supplement for now

## 2013-10-08 ENCOUNTER — Other Ambulatory Visit: Payer: Self-pay | Admitting: *Deleted

## 2013-10-08 MED ORDER — HYDROCODONE-ACETAMINOPHEN 5-325 MG PO TABS: ORAL_TABLET | ORAL | Status: DC

## 2013-10-10 ENCOUNTER — Other Ambulatory Visit: Payer: Self-pay | Admitting: Hematology and Oncology

## 2013-10-11 ENCOUNTER — Other Ambulatory Visit: Payer: Medicare Other

## 2013-10-11 ENCOUNTER — Telehealth: Payer: Self-pay | Admitting: *Deleted

## 2013-10-11 NOTE — Telephone Encounter (Signed)
S/w nurse Gerald Stabs at [facility].  Informed of appts here canceled.  Pt can continue to be managed by PCP.  She verbalized understanding.

## 2013-10-11 NOTE — Telephone Encounter (Signed)
Message copied by Cathlean Cower on Fri Oct 11, 2013  9:58 AM ------      Message from: [facility] [place]: Thu Oct 10, 2013  2:30 PM      Regarding: appt       I reviewed her chart      I cancelled her appt because she does not need to come back      Follow with pCP only      Thanks ------

## 2013-10-25 ENCOUNTER — Non-Acute Institutional Stay (SKILLED_NURSING_FACILITY): Payer: Medicare Other | Admitting: Internal Medicine

## 2013-10-25 ENCOUNTER — Encounter: Payer: Self-pay | Admitting: Internal Medicine

## 2013-10-25 DIAGNOSIS — G629 Polyneuropathy, unspecified: Secondary | ICD-10-CM | POA: Insufficient documentation

## 2013-10-25 DIAGNOSIS — G894 Chronic pain syndrome: Secondary | ICD-10-CM

## 2013-10-25 DIAGNOSIS — F329 Major depressive disorder, single episode, unspecified: Secondary | ICD-10-CM

## 2013-10-25 DIAGNOSIS — D509 Iron deficiency anemia, unspecified: Secondary | ICD-10-CM | POA: Insufficient documentation

## 2013-10-25 DIAGNOSIS — I10 Essential (primary) hypertension: Secondary | ICD-10-CM

## 2013-10-25 DIAGNOSIS — K5900 Constipation, unspecified: Secondary | ICD-10-CM | POA: Insufficient documentation

## 2013-10-25 DIAGNOSIS — D649 Anemia, unspecified: Secondary | ICD-10-CM | POA: Insufficient documentation

## 2013-10-25 DIAGNOSIS — F3289 Other specified depressive episodes: Secondary | ICD-10-CM

## 2013-10-25 DIAGNOSIS — G609 Hereditary and idiopathic neuropathy, unspecified: Secondary | ICD-10-CM

## 2013-10-25 NOTE — Progress Notes (Signed)
Patient ID: Timothy Lasso, female   DOB: 10-19-1924, 80 y.o.   MRN: [id number]   [name] living [employer]  Chief Complaint  Patient presents with  . Medical Managment of Chronic Issues  . Leg Pain   Allergies  Allergen Reactions  . Codeine     Hallucinations  . Ibuprofen     Confusion    HPI 80 y/o female pt seen today for routine visit. She has ben having burning pain with crawling sensation and pain in both her legs and feet on and off for sometime now but is has worsened over past 1 week. This disturbs her at night mostly. No other concerns. No new concern from staff. She propels herself in wheelchair  Review of Systems  Constitutional: Negative for fever, chills, weight loss, malaise/fatigue and diaphoresis.  HENT: Negative for congestion, hearing loss and sore throat.   Eyes: Negative for blurred vision, double vision and discharge.  Respiratory: Negative for cough, sputum production, shortness of breath and wheezing.   Cardiovascular: Negative for chest pain, palpitations, orthopnea and leg swelling.  Gastrointestinal: Negative for heartburn, nausea, vomiting, abdominal pain, diarrhea and constipation.  Genitourinary: Negative for dysuria, urgency, frequency and flank pain.  Musculoskeletal: Negative for back pain, falls, joint pain and myalgias.  Skin: Negative for itching and rash.  Neurological: Negative for dizziness, tingling, focal weakness and headaches.  Psychiatric/Behavioral: Negative for depression and memory loss. The patient is not nervous/anxious.    Past Medical History  Diagnosis Date  . Hypertension   . Head ache   . Renal insufficiency, mild   . Depression   . Fecal occult blood test positive   . Restless leg syndrome   . Anemia   . Cataracts, bilateral   . Hemorrhoids   . Rupture of rotator cuff of shoulder     s/p repair by Dr. Berenice Primas 7/07. Follows with Dr. Tamera Punt for possible shoulder surgery.   . Lumbar spinal stenosis     gets Spinal  injections by Dr. Marlaine Hind   . Postnasal drip   . Atrial tachycardia     echo 04/08/11: EF 55-60%, mild LVH, grade 1 diast dysfxn;    s/p RFCA with Dr. Lovena Le  04/09/11  . Hyperkalemia 04/08/2011  . Unspecified deficiency anemia   . Osteoporosis, unspecified 04/09/2013    DEXA 5/07 : T L forearm -2.9. L femur -0.6, R femur -1.4. [name]  . CVA (cerebral infarction) 04/09/2013    MRI 2005 : Multiple tiny old lacunar infarcts as well as scattered white matter small vessel ischemic disease. Pt asymptomatic and denies TIA / CVA hx.    . Chronic pain syndrome 04/09/2013    On chronic opioids. Cervical MRI 11/ 2012 : Diffuse cervical spondylosis, Diffuse degenerative disc disease with multilevel foraminal stenosis. Possible T1 nerve root involvement. Lumbar MRI 2009 : prior fusion at L3-4 and degenerative disc disease Rotator cuff repair : Dr. Berenice Primas 7/07. Follows with Dr. Tamera Punt for possible shoulder surgery.  H/O lumbar spinal stenosis with steroid injections. Mo  . Arthritis     b/l shoulder R>L  . Rotator cuff tear arthropathy     03/22/13 dx [facility] (Dr. Tamera Punt) considering surgery  . Stroke   . History of blood transfusion 2014    anemia-    Medication reviewed. See [facility]  Physical exam BP 140/70  Pulse 80  Temp(Src) 97.6 F (36.4 C)  Resp 18  Ht 5\' 2"  (1.575 m)  Wt 127 lb (  57.607 kg)  BMI 23.22 kg/m2  SpO2 95%  General- elderly female in no acute distress Head- atraumatic, normocephalic Eyes- PERRLA, EOMI, no pallor, no icterus, no discharge Neck- no lymphadenopathy, no thyromegaly, no jugular vein distension, no carotid bruit Cardiovascular- normal s1,s2, no murmurs/ rubs/ gallops Respiratory- bilateral clear to auscultation, no wheeze, no rhonchi, no crackles Abdomen- bowel sounds present, soft, non tender Musculoskeletal- able to move her upper extremities, has paraparesis of lower extremities, in wheelchair Neurological- no focal  deficit Psychiatry- alert and oriented to person, place and time, normal mood and affect  Labs- 08/14/13 hb 11.3, hct 33.2, plt 243, wbc 7.9, na 141, k 4.0, bun 32, cr 1.01, ca 83, glu 74  Assessment/plan  Iron deficiency anemia Stable h/h. Will change iron to 325 mg 1 tab daily. Check cbc in 3 months  Hypertension Well controlled bp readings. Will continue lasix 20 mg daily, metoprolol 25 mg bid and aspirin. Monitor bp readings  Neuropathic pain Worsened. Will change her gabapentin to 200 mg tid for now and reassess  Chronic pain Stable. Continue hydrocodone-apap 5-325 1 tab q4 and then a6h prn for breakthrough pain  Depression Mood has been stable. Continue zoloft 50 mg daily. Monitor mood  Glaucoma Continue her timolol eye drop  Labs- cbc, cmp

## 2013-11-29 ENCOUNTER — Non-Acute Institutional Stay (SKILLED_NURSING_FACILITY): Payer: Medicare Other | Admitting: Internal Medicine

## 2013-11-29 DIAGNOSIS — F329 Major depressive disorder, single episode, unspecified: Secondary | ICD-10-CM

## 2013-11-29 DIAGNOSIS — G8929 Other chronic pain: Secondary | ICD-10-CM

## 2013-11-29 DIAGNOSIS — I10 Essential (primary) hypertension: Secondary | ICD-10-CM

## 2013-11-29 DIAGNOSIS — F3289 Other specified depressive episodes: Secondary | ICD-10-CM

## 2013-11-29 DIAGNOSIS — G629 Polyneuropathy, unspecified: Secondary | ICD-10-CM

## 2013-11-29 DIAGNOSIS — D509 Iron deficiency anemia, unspecified: Secondary | ICD-10-CM

## 2013-11-29 DIAGNOSIS — G609 Hereditary and idiopathic neuropathy, unspecified: Secondary | ICD-10-CM

## 2013-11-29 DIAGNOSIS — H409 Unspecified glaucoma: Secondary | ICD-10-CM

## 2013-11-29 DIAGNOSIS — K5900 Constipation, unspecified: Secondary | ICD-10-CM

## 2013-11-30 DIAGNOSIS — H409 Unspecified glaucoma: Secondary | ICD-10-CM | POA: Insufficient documentation

## 2013-11-30 DIAGNOSIS — G8929 Other chronic pain: Secondary | ICD-10-CM | POA: Insufficient documentation

## 2013-11-30 NOTE — Progress Notes (Signed)
Patient ID: [name], female   DOB: 10-09-1925, 80 y.o.   MRN: [id number]    [name] living [place]  Allergies  Allergen Reactions  . Codeine     Hallucinations  . Ibuprofen     Confusion   Chief Complaint  Patient presents with  . Medical Managment of Chronic Issues    routine visit    HPI 80 y/o female pt seen today for routine visit. Her pain in the legs persists with burning sensation. She is on neurontin and prn percocet for now with voltaren gel. No other complaints. She is sitting on her wheelchair and in no distress. She propels herself in wheelchair  Review of Systems  Constitutional: Negative for fever, chills, weight loss, malaise/fatigue and diaphoresis.  HENT: Negative for congestion, hearing loss and sore throat.   Eyes: Negative for blurred vision, double vision and discharge.  Respiratory: Negative for cough, sputum production, shortness of breath and wheezing.   Cardiovascular: Negative for chest pain, palpitations, orthopnea and leg swelling.  Gastrointestinal: Negative for heartburn, nausea, vomiting, abdominal pain, diarrhea and constipation.  Genitourinary: Negative for dysuria, urgency, frequency and flank pain.  Musculoskeletal: Negative for back pain, falls Skin: Negative for itching and rash.  Neurological: Negative for dizziness, tingling, focal weakness and headaches.  Psychiatric/Behavioral: Negative for depression.The patient is not nervous/anxious.    Past Medical History  Diagnosis Date  . Hypertension   . Head ache   . Renal insufficiency, mild   . Depression   . Fecal occult blood test positive   . Restless leg syndrome   . Anemia   . Cataracts, bilateral   . Hemorrhoids   . Rupture of rotator cuff of shoulder     s/p repair by [name] 7/07. Follows with [name] for possible shoulder surgery.   . Lumbar spinal stenosis     gets Spinal injections by [name]   . Postnasal drip   . Atrial tachycardia     echo  04/08/11: EF 55-60%, mild LVH, grade 1 diast dysfxn;    s/p RFCA with [name]  04/09/11  . Hyperkalemia 04/08/2011  . Unspecified deficiency anemia   . Osteoporosis, unspecified 04/09/2013    DEXA 5/07 : T L forearm -2.9. L femur -0.6, R femur -1.4. [name]  . CVA (cerebral infarction) 04/09/2013    MRI 2005 : Multiple tiny old lacunar infarcts as well as scattered white matter small vessel ischemic disease. Pt asymptomatic and denies TIA / CVA hx.    . Chronic pain syndrome 04/09/2013    On chronic opioids. Cervical MRI 11/ 2012 : Diffuse cervical spondylosis, Diffuse degenerative disc disease with multilevel foraminal stenosis. Possible T1 nerve root involvement. Lumbar MRI 2009 : prior fusion at L3-4 and degenerative disc disease Rotator cuff repair : [name] 7/07. Follows with [name] for possible shoulder surgery.  H/O lumbar spinal stenosis with steroid injections. Mo  . Arthritis     b/l shoulder R>L  . Rotator cuff tear arthropathy     03/22/13 dx [facility] ([name]) considering surgery  . Stroke   . History of blood transfusion 2014    anemia-    Medication reviewed. See [facility]  Physical exam BP 130/67  Pulse 67  Temp(Src) 98.2 F (36.8 C)  Resp 14  Ht 5\' 2"  (1.575 m)  Wt 136 lb (61.689 kg)  BMI 24.87 kg/m2  SpO2 95%  General- elderly female in no acute distress Head-  atraumatic, normocephalic Eyes- PERRLA, EOMI, no pallor, no icterus, no discharge Neck- no lymphadenopathy, no thyromegaly, no jugular vein distension, no carotid bruit Cardiovascular- normal s1,s2, no murmurs/ rubs/ gallops Respiratory- bilateral clear to auscultation, no wheeze, no rhonchi, no crackles Abdomen- bowel sounds present, soft, non tender Musculoskeletal- able to move her upper extremities, has paraparesis of lower extremities, in wheelchair, trace b/l edema Neurological- no focal deficit Psychiatry- alert and oriented to person, place with normal mood and  affect  Labs- 08/14/13 hb 11.3, hct 33.2, plt 243, wbc 7.9, na 141, k 4.0, bun 32, cr 1.01, ca 83, glu 74  Assessment/plan  Neuropathic pain Worsened. Will change her gabapentin to 300 mg tid for now and reassess  Chronic pain Have increased her neurontin. Continue hydrocodone-apap 5-325 1 tab q4 and then q6h prn for breakthrough pain  Iron deficiency anemia Continue  iron 325 mg 1 tab daily. Check cbc in 2 months  Hypertension Well controlled bp readings. Will continue lasix 20 mg daily, metoprolol 25 mg bid and aspirin. Monitor bp readings  Constipation Continue linzess and monitor  Depression Mood has been stable. Continue zoloft 50 mg daily. Monitor mood  Glaucoma Continue her timolol eye drop   Tammy Serve, MD  [facility] [phone] (Monday-Friday 8 am - 5 pm) [phone] (afterhours)

## 2013-12-03 ENCOUNTER — Other Ambulatory Visit: Payer: Self-pay

## 2013-12-03 MED ORDER — HYDROCODONE-ACETAMINOPHEN 5-325 MG PO TABS: ORAL_TABLET | ORAL | Status: DC

## 2013-12-03 NOTE — Telephone Encounter (Signed)
Rx faxed to [facility] @ [phone]. Phone number [phone]  

## 2013-12-13 ENCOUNTER — Non-Acute Institutional Stay (SKILLED_NURSING_FACILITY): Payer: Medicare Other | Admitting: Internal Medicine

## 2013-12-13 DIAGNOSIS — E876 Hypokalemia: Secondary | ICD-10-CM

## 2013-12-13 DIAGNOSIS — M79605 Pain in left leg: Secondary | ICD-10-CM

## 2013-12-13 DIAGNOSIS — R609 Edema, unspecified: Secondary | ICD-10-CM

## 2013-12-13 DIAGNOSIS — M79609 Pain in unspecified limb: Secondary | ICD-10-CM

## 2013-12-13 DIAGNOSIS — M79604 Pain in right leg: Secondary | ICD-10-CM

## 2013-12-13 NOTE — Progress Notes (Signed)
Patient ID: [name], female   DOB: 1925-05-31, 80 y.o.   MRN: [id number]    [name] living [employer]  Chief Complaint  Patient presents with  . Acute Visit    edema, weight gain   Allergies  Allergen Reactions  . Codeine     Hallucinations  . Ibuprofen     Confusion   HPI 80 y/o female pt seen today for acute visit. She has had 10 lbs weight gain over past 2 weeks. Her legs have increased edema and pain in her legs have worsened. The pain is mainly in both her hip areas left > right and goes down to her legs and feet. She is on neurontin and prn percocet for now with voltaren gel. No other complaints. She is sitting on her wheelchair and in no distress. She propels herself in wheelchair  Review of Systems   Constitutional: Negative for fever, chills, malaise/fatigue and diaphoresis.   HENT: Negative for congestion, hearing loss and sore throat.     Respiratory: Negative for cough, sputum production, shortness of breath and wheezing.    Cardiovascular: Negative for chest pain, palpitations, orthopnea  Musculoskeletal: Negative for back pain, falls Skin: Negative for itching and rash.   Neurological: Negative for dizziness, tingling, focal weakness and headaches.   Psychiatric/Behavioral: Negative for depression.The patient is not nervous/anxious.    Past Medical History  Diagnosis Date  . Hypertension   . Head ache   . Renal insufficiency, mild   . Depression   . Fecal occult blood test positive   . Restless leg syndrome   . Anemia   . Cataracts, bilateral   . Hemorrhoids   . Rupture of rotator cuff of shoulder     s/p repair by Dr. Berenice Primas 7/07. Follows with Dr. Tamera Punt for possible shoulder surgery.   . Lumbar spinal stenosis     gets Spinal injections by Dr. Marlaine Hind   . Postnasal drip   . Atrial tachycardia     echo 04/08/11: EF 55-60%, mild LVH, grade 1 diast dysfxn;    s/p RFCA with Dr. Lovena Le  04/09/11  . Hyperkalemia 04/08/2011  . Unspecified deficiency  anemia   . Osteoporosis, unspecified 04/09/2013    DEXA 5/07 : T L forearm -2.9. L femur -0.6, R femur -1.4. [name]  . CVA (cerebral infarction) 04/09/2013    MRI 2005 : Multiple tiny old lacunar infarcts as well as scattered white matter small vessel ischemic disease. Pt asymptomatic and denies TIA / CVA hx.    . Chronic pain syndrome 04/09/2013    On chronic opioids. Cervical MRI 11/ 2012 : Diffuse cervical spondylosis, Diffuse degenerative disc disease with multilevel foraminal stenosis. Possible T1 nerve root involvement. Lumbar MRI 2009 : prior fusion at L3-4 and degenerative disc disease Rotator cuff repair : Dr. Berenice Primas 7/07. Follows with Dr. Tamera Punt for possible shoulder surgery.  H/O lumbar spinal stenosis with steroid injections. Mo  . Arthritis     b/l shoulder R>L  . Rotator cuff tear arthropathy     03/22/13 dx [facility] (Dr. Tamera Punt) considering surgery  . Stroke   . History of blood transfusion 2014    anemia-    Medication reviewed. See [facility]  Physical exam BP 130/68  Pulse 76  Temp(Src) 97.4 F (36.3 C)  Resp 19  Ht 5\' 2"  (1.575 m)  Wt 144 lb (65.318 kg)  BMI 26.33 kg/m2  SpO2 95%  General- elderly female in no acute distress  Head- atraumatic, normocephalic Eyes- PERRLA, EOMI, no pallor, no icterus, no discharge Neck- no lymphadenopathy, no thyromegaly, no jugular vein distension, no carotid bruit Cardiovascular- normal s1,s2, no murmurs/ rubs/ gallops Respiratory- bilateral clear to auscultation, no wheeze, no rhonchi, no crackles Abdomen- bowel sounds present, soft, non tender Musculoskeletal- able to move her upper extremities, has paraparesis of lower extremities, in wheelchair, edema 2+ legs, tenderness with extension of her legs, resists rotation.  Neurological- no focal deficit Psychiatry- alert and oriented to person, place with normal mood and affect  Labs- 08/14/13 hb 11.3, hct 33.2, plt 243, wbc 7.9, na 141, k 4.0, bun 32, cr 1.01,  ca 83, glu 74 12/12/13 na 142, k 3.3, bun 17, cr 0.85, glu 86  Assessment/plan  Edema Normal cardiac exam. Symmetrical swelling making dvt unlikely. No signs of skin infection. Will increase her lasix to 20 mg bid for now. Will also have her on kcl. Recheck bmp in 2 weeks  Hypokalemia Replete with kcl 20 meq daily. Recheck bmp in 2 weeks  Leg pain Her neuroapthy, chronic pain syndrome and increased edema could have worsened this. Given the pain on both hip areas that have acutely increased, will get pelvis xray to rule out fracture. Also change hydrocodone- apap to 10-325 mg q4h standing for now, hold for sedation. Continue gabapentin  Blanchie Serve, MD  [facility] [phone] (Monday-Friday 8 am - 5 pm) [phone] (afterhours)

## 2014-01-03 ENCOUNTER — Other Ambulatory Visit: Payer: Medicare Other

## 2014-01-07 ENCOUNTER — Other Ambulatory Visit: Payer: Self-pay | Admitting: *Deleted

## 2014-01-07 MED ORDER — HYDROCODONE-ACETAMINOPHEN 10-325 MG PO TABS: ORAL_TABLET | ORAL | Status: DC

## 2014-01-07 NOTE — Telephone Encounter (Signed)
[facility] [place] 

## 2014-02-05 ENCOUNTER — Other Ambulatory Visit: Payer: Self-pay | Admitting: *Deleted

## 2014-02-05 MED ORDER — HYDROCODONE-ACETAMINOPHEN 10-325 MG PO TABS: ORAL_TABLET | ORAL | Status: DC

## 2014-02-05 NOTE — Telephone Encounter (Signed)
[facility] [place] 

## 2014-02-13 ENCOUNTER — Non-Acute Institutional Stay (SKILLED_NURSING_FACILITY): Payer: Medicare Other | Admitting: Internal Medicine

## 2014-02-13 DIAGNOSIS — M79609 Pain in unspecified limb: Secondary | ICD-10-CM

## 2014-02-13 DIAGNOSIS — F3289 Other specified depressive episodes: Secondary | ICD-10-CM

## 2014-02-13 DIAGNOSIS — G8929 Other chronic pain: Secondary | ICD-10-CM

## 2014-02-13 DIAGNOSIS — K5900 Constipation, unspecified: Secondary | ICD-10-CM

## 2014-02-13 DIAGNOSIS — M79606 Pain in leg, unspecified: Secondary | ICD-10-CM

## 2014-02-13 DIAGNOSIS — F329 Major depressive disorder, single episode, unspecified: Secondary | ICD-10-CM

## 2014-02-17 DIAGNOSIS — G8929 Other chronic pain: Secondary | ICD-10-CM | POA: Insufficient documentation

## 2014-02-17 DIAGNOSIS — M79606 Pain in leg, unspecified: Secondary | ICD-10-CM

## 2014-02-17 NOTE — Progress Notes (Signed)
Patient ID: [name], female   DOB: 11-18-1924, 80 y.o.   MRN: [id number]    Chief Complaint  Patient presents with  . Medical Management of Chronic Issues    RV   Allergies  Allergen Reactions  . Codeine     Hallucinations  . Ibuprofen     Confusion   HPI 80 y/o female pt seen today for routine visit. Still has pain in her legs (chronic)l. No other complaints. She is sitting on her wheelchair and in no distress. She propels herself in wheelchair  Review of Systems   Constitutional: Negative for fever, chills, weight loss, malaise/fatigue and diaphoresis.   HENT: Negative for congestion, hearing loss and sore throat.    Eyes: Negative for blurred vision, double vision and discharge.   Respiratory: Negative for cough, sputum production, shortness of breath and wheezing.    Cardiovascular: Negative for chest pain, palpitations, orthopnea and leg swelling.   Gastrointestinal: Negative for heartburn, nausea, vomiting, abdominal pain, diarrhea and constipation.   Genitourinary: Negative for dysuria, urgency, frequency and flank pain.   Musculoskeletal: Negative for back pain, falls Skin: Negative for itching and rash.   Neurological: Negative for dizziness, tingling, focal weakness and headaches.   Psychiatric/Behavioral: Negative for depression.The patient is not nervous/anxious.    Past Medical History  Diagnosis Date  . Hypertension   . Head ache   . Renal insufficiency, mild   . Depression   . Fecal occult blood test positive   . Restless leg syndrome   . Anemia   . Cataracts, bilateral   . Hemorrhoids   . Rupture of rotator cuff of shoulder     s/p repair by Dr. Berenice Primas 7/07. Follows with Dr. Tamera Punt for possible shoulder surgery.   . Lumbar spinal stenosis     gets Spinal injections by Dr. Marlaine Hind   . Postnasal drip   . Atrial tachycardia     echo 04/08/11: EF 55-60%, mild LVH, grade 1 diast dysfxn;    s/p RFCA with Dr. Lovena Le  04/09/11  . Hyperkalemia  04/08/2011  . Unspecified deficiency anemia   . Osteoporosis, unspecified 04/09/2013    DEXA 5/07 : T L forearm -2.9. L femur -0.6, R femur -1.4. [name]  . CVA (cerebral infarction) 04/09/2013    MRI 2005 : Multiple tiny old lacunar infarcts as well as scattered white matter small vessel ischemic disease. Pt asymptomatic and denies TIA / CVA hx.    . Chronic pain syndrome 04/09/2013    On chronic opioids. Cervical MRI 11/ 2012 : Diffuse cervical spondylosis, Diffuse degenerative disc disease with multilevel foraminal stenosis. Possible T1 nerve root involvement. Lumbar MRI 2009 : prior fusion at L3-4 and degenerative disc disease Rotator cuff repair : Dr. Berenice Primas 7/07. Follows with Dr. Tamera Punt for possible shoulder surgery.  H/O lumbar spinal stenosis with steroid injections. Mo  . Arthritis     b/l shoulder R>L  . Rotator cuff tear arthropathy     03/22/13 dx [facility] (Dr. Tamera Punt) considering surgery  . Stroke   . History of blood transfusion 2014    anemia-    Medication reviewed. See [facility]  Physical exam VSS  General- elderly female in no acute distress Head- atraumatic, normocephalic Cardiovascular- normal s1,s2, no murmurs/ rubs/ gallops Respiratory- bilateral clear to auscultation, no wheeze, no rhonchi, no crackles Abdomen- bowel sounds present, soft, non tender Musculoskeletal- able to move her upper extremities, has paraparesis of lower extremities, in wheelchair, trace  b/l edema Neurological- no focal deficit Psychiatry- alert and oriented to person, place with normal mood and affect  Labs- 08/14/13 hb 11.3, hct 33.2, plt 243, wbc 7.9, na 141, k 4.0, bun 32, cr 1.01, ca 83, glu 74  Assessment/plan  Constipation Continue linzess and monitor  Depression Mood has been stable. Continue zoloft 50 mg daily. Monitor mood  Chronic pain Continue hydrocodone-apap 5-325 1 tab q4 and then q6h prn for breakthrough pain with neurontin 300 mg tid. Monitor bowel  regimen

## 2014-03-27 ENCOUNTER — Ambulatory Visit: Payer: Medicare Other

## 2014-03-27 ENCOUNTER — Ambulatory Visit: Payer: Medicare Other | Admitting: Hematology and Oncology

## 2014-03-27 ENCOUNTER — Other Ambulatory Visit: Payer: Medicare Other

## 2014-04-04 ENCOUNTER — Encounter: Payer: Self-pay | Admitting: Internal Medicine

## 2014-04-04 ENCOUNTER — Non-Acute Institutional Stay (SKILLED_NURSING_FACILITY): Payer: Medicare Other | Admitting: Internal Medicine

## 2014-04-04 DIAGNOSIS — R52 Pain, unspecified: Secondary | ICD-10-CM

## 2014-04-04 DIAGNOSIS — R609 Edema, unspecified: Secondary | ICD-10-CM

## 2014-04-04 DIAGNOSIS — D509 Iron deficiency anemia, unspecified: Secondary | ICD-10-CM

## 2014-04-04 NOTE — Progress Notes (Signed)
Patient ID: [name], female   DOB: 1925/01/08, 80 y.o.   MRN: [id number]    Facility: [facility] Chief Complaint  Patient presents with  . Medical Management of Chronic Issues    routine visit, hurting all over   Allergies  Allergen Reactions  . Codeine     Hallucinations  . Ibuprofen     Confusion   HPI 80 y/o female pt seen today for routine visit. Continues to complain of pain- more today and says she is hurting all over. She is sitting on her wheelchair and in no distress. She propels herself in wheelchair. No falls reported. No new skin concern.   Review of Systems   Constitutional: Negative for fever, chills, diaphoresis.   HENT: Negative for congestion, hearing loss and sore throat.    Respiratory: Negative for cough, sputum production, shortness of breath and wheezing.    Cardiovascular: Negative for chest pain, palpitations, orthopnea and leg swelling.   Gastrointestinal: Negative for heartburn, nausea, vomiting, abdominal pain Musculoskeletal: Negative for back pain, falls Skin: Negative for itching and rash.    Past Medical History  Diagnosis Date  . Hypertension   . Head ache   . Renal insufficiency, mild   . Depression   . Fecal occult blood test positive   . Restless leg syndrome   . Anemia   . Cataracts, bilateral   . Hemorrhoids   . Rupture of rotator cuff of shoulder     s/p repair by Dr. Berenice Primas 7/07. Follows with Dr. Tamera Punt for possible shoulder surgery.   . Lumbar spinal stenosis     gets Spinal injections by Dr. Marlaine Hind   . Postnasal drip   . Atrial tachycardia     echo 04/08/11: EF 55-60%, mild LVH, grade 1 diast dysfxn;    s/p RFCA with Dr. Lovena Le  04/09/11  . Hyperkalemia 04/08/2011  . Unspecified deficiency anemia   . Osteoporosis, unspecified 04/09/2013    DEXA 5/07 : T L forearm -2.9. L femur -0.6, R femur -1.4. [name]  . CVA (cerebral infarction) 04/09/2013    MRI 2005 : Multiple tiny old lacunar  infarcts as well as scattered white matter small vessel ischemic disease. Pt asymptomatic and denies TIA / CVA hx.    . Chronic pain syndrome 04/09/2013    On chronic opioids. Cervical MRI 11/ 2012 : Diffuse cervical spondylosis, Diffuse degenerative disc disease with multilevel foraminal stenosis. Possible T1 nerve root involvement. Lumbar MRI 2009 : prior fusion at L3-4 and degenerative disc disease Rotator cuff repair : Dr. Berenice Primas 7/07. Follows with Dr. Tamera Punt for possible shoulder surgery.  H/O lumbar spinal stenosis with steroid injections. Mo  . Arthritis     b/l shoulder R>L  . Rotator cuff tear arthropathy     03/22/13 dx [facility] (Dr. Tamera Punt) considering surgery  . Stroke   . History of blood transfusion 2014    anemia-    Medication reviewed. See [facility]  Physical exam BP 128/72  Pulse 68  Temp(Src) 98.1 F (36.7 C)  Resp 19  Ht 5' 2"  (1.575 m)  Wt 141 lb (63.957 kg)  BMI 25.78 kg/m2  SpO2 95%  General- elderly female in no acute distress Head- atraumatic, normocephalic Cardiovascular- normal s1,s2, no murmurs Respiratory- bilateral clear to auscultation, no wheeze, no rhonchi, no crackles Abdomen- bowel sounds present, soft, non tender Musculoskeletal- able to move her upper extremities, has paraparesis of lower extremities, in wheelchair, 1+ b/l  edema, multiple point diffuse tenderness Neurological- no focal deficit Psychiatry- alert and oriented to person, place with normal mood and affect  Labs- 08/14/13 hb 11.3, hct 33.2, plt 243, wbc 7.9, na 141, k 4.0, bun 32, cr 1.01, ca 83, glu 74 01/13/14 wbc 6.6, hb 8.4, hct 26.9, plt 204, na 143, k 3.5, bun 17, cr 0.84, alb 2.7, ca 8.2 03/07/14 wbc 8, hb 8.6, hct 27.4, plt 239  Assessment/plan  Anemia With her low hb/hct will get her anemia workup- rule out iron def and B91/ folic acid def. Her anemia could be making her feel tired and lethargic with diffuse aches. Increase ferrous sulfate ti 325 mg bid for  now  Diffuse pain Persists with acute worsening as per pt. Check esr and ck to assess for muscle inflammation and breakdown. Will have her on naproxyn 500 mg bid x 3 days, review labs and assess further. Continue gabapentin 300 mg tid and her hydrocodone-apap. Monitor bowel regimen. If these labs are normal, consider thyroid function check  Edema Increased. Her anemia could be contributing some. Will increase lasix to 40 mg in am and continue 20 mg in pm  Labs- esr, ck, cbc, L68, folic acid, ferritin

## 2014-04-15 ENCOUNTER — Non-Acute Institutional Stay (SKILLED_NURSING_FACILITY): Payer: Medicare Other | Admitting: Internal Medicine

## 2014-04-15 ENCOUNTER — Encounter: Payer: Self-pay | Admitting: Internal Medicine

## 2014-04-15 DIAGNOSIS — G609 Hereditary and idiopathic neuropathy, unspecified: Secondary | ICD-10-CM

## 2014-04-15 DIAGNOSIS — G9520 Unspecified cord compression: Secondary | ICD-10-CM

## 2014-04-15 DIAGNOSIS — G959 Disease of spinal cord, unspecified: Secondary | ICD-10-CM

## 2014-04-15 DIAGNOSIS — IMO0002 Reserved for concepts with insufficient information to code with codable children: Secondary | ICD-10-CM

## 2014-04-15 DIAGNOSIS — G629 Polyneuropathy, unspecified: Secondary | ICD-10-CM

## 2014-04-15 NOTE — Progress Notes (Signed)
Patient ID: [name], female   DOB: September 28, 1925, 80 y.o.   MRN: [id number]  [facility] Provider:  Jonelle Sidle L. Mariea Clonts, D.O., C.M.D.  Code Status:  Full code  Chief Complaint  Patient presents with  . Acute Visit    increased pain and burning of legs    HPI:  80 yo black female here for long term care seen for acute visit due to increased pain and burning of her legs.  Review of Systems:  Review of Systems  Constitutional: Positive for malaise/fatigue.  HENT: Negative for hearing loss.   Eyes: Negative for blurred vision.  Respiratory: Negative for shortness of breath.   Cardiovascular: Negative for chest pain.  Gastrointestinal: Negative for abdominal pain.  Genitourinary: Negative for dysuria.  Musculoskeletal: Positive for back pain.  Neurological: Positive for sensory change, focal weakness and weakness. Negative for loss of consciousness and headaches.  Endo/Heme/Allergies: Does not bruise/bleed easily.  Psychiatric/Behavioral: Positive for memory loss.    Medications: Patient's Medications  New Prescriptions   No medications on file  Previous Medications   ASPIRIN EC 81 MG TABLET    Take 81 mg by mouth daily.   CARBOXYMETHYLCELLULOSE (REFRESH PLUS) 0.5 % SOLN    Place 1 drop into the right eye 3 (three) times daily.    DICLOFENAC SODIUM (VOLTAREN) 1 % GEL    Apply 4 g topically 4 (four) times daily. Bilateral knees   FERROUS SULFATE 325 (65 FE) MG TABLET    Take 325 mg by mouth daily with breakfast.    FUROSEMIDE (LASIX) 20 MG TABLET    Take 20 mg by mouth at bedtime.    FUROSEMIDE (LASIX) 40 MG TABLET    Take 40 mg by mouth daily.   GABAPENTIN (NEURONTIN) 100 MG CAPSULE    300 mg tid   HYDROCODONE-ACETAMINOPHEN (NORCO) 10-325 MG PER TABLET    Take one tablet by mouth every 4 hours for pain   HYDROCODONE-ACETAMINOPHEN (NORCO/VICODIN) 5-325 MG PER TABLET    Take one tablet by mouth every 4 hours routinely for pain; Take one tablet by mouth every 6 hours  as needed for breakthrough pain. DO NOT EXCEED 3000 MG OF APAP FROM ALL SOURCES IN 24 HOURS   LINACLOTIDE (LINZESS) 145 MCG CAPS CAPSULE    Take 145 mcg by mouth daily.   LOTEPREDNOL (LOTEMAX) 0.5 % OPHTHALMIC SUSPENSION    Place 1 drop into the right eye daily.   METOPROLOL TARTRATE (LOPRESSOR) 25 MG TABLET    Take 1 tablet (25 mg total) by mouth 2 (two) times daily.   MULTIPLE VITAMINS-MINERALS (DECUBI-VITE PO)    Take 1 capsule by mouth 2 (two) times daily.   POTASSIUM CHLORIDE SA (K-DUR,KLOR-CON) 20 MEQ TABLET    Take 20 mEq by mouth daily.   SERTRALINE (ZOLOFT) 50 MG TABLET    Take 50 mg by mouth daily.  Modified Medications   No medications on file  Discontinued Medications   DICLOFENAC SODIUM (VOLTAREN) 1 % GEL    Apply topically 4 grams/4  times daily as needed for pain    Physical Exam: Filed Vitals:   04/15/14 1215  BP: 138/87  Pulse: 64  Temp: 98.1 F (36.7 C)  Resp: 18  Height: 5\' 2"  (1.575 m)  Weight: 138 lb (62.596 kg)  SpO2: 95%  Physical Exam  Cardiovascular: Normal rate, regular rhythm, normal heart sounds and intact distal pulses.   Pulmonary/Chest: Effort normal and breath sounds normal. No respiratory distress.  Abdominal: Soft.  Bowel sounds are normal. She exhibits no distension and no mass. There is no tenderness.  Musculoskeletal:  Weakness of bilateral LE 4/5  Neurological: She is alert.    Labs reviewed: Basic Metabolic Panel:  Recent Labs  07/18/13 1403 08/05/13 1327  NA 144 139  K 3.5 4.0  CL  --  104  CO2 26 26  GLUCOSE 74 95  BUN 13.3 16  CREATININE 0.8 0.89  CALCIUM 8.5 9.2   CBC:  Recent Labs  04/25/13 1013 07/18/13 1402 08/05/13 1327  WBC 7.7 6.4 7.1  NEUTROABS 3.7 2.7 3.4  HGB 11.7 12.8 14.5  HCT 35.7 38.3 41.1  MCV 96.5 93.6 93.8  PLT 189 152 207    Assessment/Plan 1. Peripheral neuropathy -r/o b12/folate deficiency and diabetes with b12/folate and hba1c tests -suspect due to #2 and 3 -on gabapentin 300mg  po tid  with GFR 66 last lab in epic  2. Degeneration of thoracic or thoracolumbar intervertebral disc -noted on previous imaging  3. Spinal cord compression -longstanding, here for rehab and now long term care, uses motorized wc   Family/ staff Communication: seen with unit supervisor  Labs/tests ordered:  B12/folate, hba1c

## 2014-04-18 ENCOUNTER — Non-Acute Institutional Stay (SKILLED_NURSING_FACILITY): Payer: Medicare Other | Admitting: Internal Medicine

## 2014-04-18 DIAGNOSIS — I951 Orthostatic hypotension: Secondary | ICD-10-CM

## 2014-04-18 DIAGNOSIS — D649 Anemia, unspecified: Secondary | ICD-10-CM

## 2014-04-18 NOTE — Progress Notes (Signed)
Patient ID: [name], female   DOB: [birth year], 80 y.o.   MRN: [id number]    Facility: [facility]  Chief Complaint  Patient presents with  . Acute Visit    low hemoglobin, low blood pressure   Allergies  Allergen Reactions  . Codeine     Hallucinations  . Ibuprofen     Confusion   HPI 80 y/o female pt seen today for acute visit. She was noted by staff to have low blood pressure reading - manual reading 100/50.  Patient mentions that she feels weak and tired. She denies any chest pain or trouble breathing. No fever or chills reported by staff She also had routine labs doen yesterday and on review has low hb/hct. Denies abdominal pain, nausea or vomiting Has not had her breakfast this am  ROS denies nausea or vomiting Denies dizziness/ lightheadedness or headache Denies tinnitus No focal weakness  Past Medical History  Diagnosis Date  . Hypertension   . Head ache   . Renal insufficiency, mild   . Depression   . Fecal occult blood test positive   . Restless leg syndrome   . Anemia   . Cataracts, bilateral   . Hemorrhoids   . Rupture of rotator cuff of shoulder     s/p repair by Dr. Berenice Primas 7/07. Follows with Dr. Tamera Punt for possible shoulder surgery.   . Lumbar spinal stenosis     gets Spinal injections by Dr. Marlaine Hind   . Postnasal drip   . Atrial tachycardia     echo 04/08/11: EF 55-60%, mild LVH, grade 1 diast dysfxn;    s/p RFCA with Dr. Lovena Le  04/09/11  . Hyperkalemia 04/08/2011  . Unspecified deficiency anemia   . Osteoporosis, unspecified 04/09/2013    DEXA 5/07 : T L forearm -2.9. L femur -0.6, R femur -1.4. [name]  . CVA (cerebral infarction) 04/09/2013    MRI 2005 : Multiple tiny old lacunar infarcts as well as scattered white matter small vessel ischemic disease. Pt asymptomatic and denies TIA / CVA hx.    . Chronic pain syndrome 04/09/2013    On chronic opioids. Cervical MRI 11/ 2012 : Diffuse cervical spondylosis,  Diffuse degenerative disc disease with multilevel foraminal stenosis. Possible T1 nerve root involvement. Lumbar MRI 2009 : prior fusion at L3-4 and degenerative disc disease Rotator cuff repair : Dr. Berenice Primas 7/07. Follows with Dr. Tamera Punt for possible shoulder surgery.  H/O lumbar spinal stenosis with steroid injections. Mo  . Arthritis     b/l shoulder R>L  . Rotator cuff tear arthropathy     03/22/13 dx [facility] (Dr. Tamera Punt) considering surgery  . Stroke   . History of blood transfusion 2014    anemia-    Medication reviewed. See [facility]  Physical exam BP 110/62  Pulse 60  Temp(Src) 98 F (36.7 C)  Resp 18  SpO2 95%  Laying down bp 110/62, HR 60/min Sitting up bp 100/50, HR 64/min  General- elderly female appears weak and tired, in no acute distress Head- atraumatic, normocephalic Cardiovascular- normal s1,s2, no murmurs Respiratory- bilateral clear to auscultation, no wheeze, no rhonchi, no crackles Abdomen- bowel sounds present, soft, non tender Musculoskeletal- able to move her upper extremities, has paraparesis of lower extremities, trace b/l edema Neurological- no focal deficit Psychiatry- alert and oriented to person  Labs: 04/17/14 wbc 8.4, hb 7.4, hct 23.4, mcv 93.2, plt 266 04/16/14 hb 6.6  CBC Latest Ref Rng 08/05/2013  07/18/2013 04/25/2013  WBC 4.0 - 10.5 K/uL 7.1 6.4 7.7  Hemoglobin 12.0 - 15.0 g/dL 14.5 12.8 11.7  Hematocrit 36.0 - 46.0 % 41.1 38.3 35.7  Platelets 150 - 400 K/uL 207 152 189   Assessment/plan  Orthostatic hypotension Iv fluid NS @ 100 cc/hr for 1 litre Examine lungs for ? Congestion and need of lasix after completion of fluid Hold lasix and metoprolol for now with low bp Slow position change No dizziness reported Check orthostatic q shift Check cmp in am   Anemia D/c aspirin for now Change iron supplement to 325 mg tid Check erythropoetin level and bmp to assess for anemia of chronic disease Check cbc in am 04/19/14 Check  guaiac stool x 3 to assess for gi bleed pantoprazole 40 mg bid for 3 days and then once a day prophylactically with concern for gastritis/ gi bleed

## 2014-04-19 LAB — CBC AND DIFFERENTIAL
HCT: 23 % — AB (ref 36–46)
Hemoglobin: 7.5 g/dL — AB (ref 12.0–16.0)
Platelets: 252 10*3/uL (ref 150–399)
WBC: 7.8 10^3/mL

## 2014-04-19 LAB — BASIC METABOLIC PANEL
BUN: 27 mg/dL — AB (ref 4–21)
Creatinine: 1.1 mg/dL (ref <=1.1)
GLUCOSE: 98 mg/dL
Potassium: 3.7 mmol/L (ref 3.4–5.3)
Sodium: 142 mmol/L (ref 137–147)

## 2014-04-19 LAB — HEPATIC FUNCTION PANEL
ALK PHOS: 111 U/L (ref 25–125)
ALT: 12 U/L (ref 7–35)
AST: 28 U/L (ref 13–35)
Bilirubin, Total: 0.4 mg/dL

## 2014-04-28 LAB — POCT ERYTHROCYTE SEDIMENTATION RATE, NON-AUTOMATED: SED RATE: 78 mm

## 2014-05-09 DIAGNOSIS — R609 Edema, unspecified: Secondary | ICD-10-CM | POA: Insufficient documentation

## 2014-05-09 DIAGNOSIS — R52 Pain, unspecified: Secondary | ICD-10-CM | POA: Insufficient documentation

## 2014-05-13 ENCOUNTER — Other Ambulatory Visit: Payer: Self-pay | Admitting: *Deleted

## 2014-05-13 MED ORDER — HYDROCODONE-ACETAMINOPHEN 10-325 MG PO TABS: ORAL_TABLET | ORAL | Status: DC

## 2014-05-13 NOTE — Telephone Encounter (Signed)
[facility] 

## 2014-05-15 ENCOUNTER — Other Ambulatory Visit: Payer: Self-pay | Admitting: *Deleted

## 2014-05-15 MED ORDER — HYDROCODONE-ACETAMINOPHEN 10-325 MG PO TABS: ORAL_TABLET | ORAL | Status: DC

## 2014-05-15 NOTE — Telephone Encounter (Signed)
[facility] 

## 2014-05-22 ENCOUNTER — Other Ambulatory Visit: Payer: Self-pay | Admitting: *Deleted

## 2014-05-22 MED ORDER — FENTANYL 25 MCG/HR TD PT72: 25.0000 ug | MEDICATED_PATCH | TRANSDERMAL | Status: DC

## 2014-05-22 NOTE — Telephone Encounter (Signed)
[facility] 

## 2014-05-23 ENCOUNTER — Encounter: Payer: Self-pay | Admitting: Internal Medicine

## 2014-05-23 ENCOUNTER — Non-Acute Institutional Stay (SKILLED_NURSING_FACILITY): Payer: Medicare Other | Admitting: Internal Medicine

## 2014-05-23 DIAGNOSIS — G8929 Other chronic pain: Secondary | ICD-10-CM

## 2014-05-23 DIAGNOSIS — F32A Depression, unspecified: Secondary | ICD-10-CM

## 2014-05-23 DIAGNOSIS — F3289 Other specified depressive episodes: Secondary | ICD-10-CM

## 2014-05-23 DIAGNOSIS — E876 Hypokalemia: Secondary | ICD-10-CM

## 2014-05-23 DIAGNOSIS — K219 Gastro-esophageal reflux disease without esophagitis: Secondary | ICD-10-CM

## 2014-05-23 DIAGNOSIS — R700 Elevated erythrocyte sedimentation rate: Secondary | ICD-10-CM

## 2014-05-23 DIAGNOSIS — F329 Major depressive disorder, single episode, unspecified: Secondary | ICD-10-CM

## 2014-05-23 DIAGNOSIS — D649 Anemia, unspecified: Secondary | ICD-10-CM

## 2014-05-23 DIAGNOSIS — K5909 Other constipation: Secondary | ICD-10-CM

## 2014-05-23 NOTE — Progress Notes (Signed)
Patient ID: [name], female   DOB: [birth year], 80 y.o.   MRN: [id number]  Location:  [place] [facility] Provider:  Blanchie Serve, MD  Code Status: Full  Chief Complaint  Patient presents with  . Medical Management of Chronic Issues    HPI 80 y/o female pt seen today for routine visit. She has been complaining of generalized aches. No other complaints. She is sitting on her wheelchair and in no distress.   Review of Systems   Constitutional: Negative for fever, chills, diaphoresis.   HENT: Negative for congestion, hearing loss and sore throat.    Eyes: Negative for blurred vision, double vision and discharge.   Respiratory: Negative for cough, sputum production, shortness of breath and wheezing.    Cardiovascular: Negative for chest pain, palpitations, orthopnea and leg swelling.   Gastrointestinal: Negative for heartburn, nausea, vomiting, abdominal pain.   Genitourinary: Negative for dysuria Musculoskeletal: Negative for falls Skin: Negative for itching and rash.   Neurological: Negative for dizziness and headaches.   Psychiatric/Behavioral: Negative for depression.The patient is not nervous/anxious.       Medications: Patient's Medications  New Prescriptions   No medications on file  Previous Medications   CARBOXYMETHYLCELLULOSE (REFRESH PLUS) 0.5 % SOLN    Place 1 drop into the right eye 3 (three) times daily.    DICLOFENAC SODIUM (VOLTAREN) 1 % GEL    Apply 4 g topically 4 (four) times daily. Bilateral knees   FENTANYL (DURAGESIC - DOSED MCG/HR) 25 MCG/HR PATCH    Place 1 patch (25 mcg total) onto the skin every 3 (three) days.   FERROUS SULFATE 325 (65 FE) MG TABLET    Take 325 mg by mouth daily with breakfast.    GABAPENTIN (NEURONTIN) 600 MG TABLET    Take 600 mg by mouth 3 (three) times daily.   HYDROCODONE-ACETAMINOPHEN (NORCO) 10-325 MG PER TABLET    Take one tablet by mouth every 4 hours for pain   HYDROCODONE-ACETAMINOPHEN (NORCO/VICODIN) 5-325 MG PER  TABLET    Take one tablet by mouth every 4 hours routinely for pain; Take one tablet by mouth every 6 hours as needed for breakthrough pain. DO NOT EXCEED 3000 MG OF APAP FROM ALL SOURCES IN 24 HOURS   LINACLOTIDE (LINZESS) 145 MCG CAPS CAPSULE    Take 145 mcg by mouth daily.   MULTIPLE VITAMINS-MINERALS (DECUBI-VITE PO)    Take 1 capsule by mouth 2 (two) times daily.   PANTOPRAZOLE (PROTONIX) 40 MG TABLET    Take 40 mg by mouth daily.   POTASSIUM CHLORIDE SA (K-DUR,KLOR-CON) 20 MEQ TABLET    Take 20 mEq by mouth daily.   SERTRALINE (ZOLOFT) 50 MG TABLET    Take 50 mg by mouth daily.  Modified Medications   No medications on file  Discontinued Medications   ASPIRIN EC 81 MG TABLET    Take 81 mg by mouth daily.   FUROSEMIDE (LASIX) 20 MG TABLET    Take 20 mg by mouth at bedtime.    FUROSEMIDE (LASIX) 40 MG TABLET    Take 40 mg by mouth daily.   GABAPENTIN (NEURONTIN) 100 MG CAPSULE    300 mg tid   LOTEPREDNOL (LOTEMAX) 0.5 % OPHTHALMIC SUSPENSION    Place 1 drop into the right eye daily.   METOPROLOL TARTRATE (LOPRESSOR) 25 MG TABLET    Take 1 tablet (25 mg total) by mouth 2 (two) times daily.    Physical Exam: Filed Vitals:   05/23/14 5537  BP: 121/54  Pulse: 71  Temp: 97.4 F (36.3 C)  Resp: 18  Height: _0  (1.575 m)  Weight: 128 lb (58.06 kg)  SpO2: 95%   General- elderly female in no acute distress Head- atraumatic, normocephalic Cardiovascular- normal s1,s2, no murmurs/ rubs/ gallops Respiratory- bilateral clear to auscultation, no wheeze, no rhonchi, no crackles Abdomen- bowel sounds present, soft, non tender Musculoskeletal- able to move her upper extremities, has paraparesis of lower extremities, in wheelchair, trace b/l edema Neurological- no focal deficit Psychiatry- alert and oriented to person, place with normal mood and affect   Labs reviewed: Basic Metabolic Panel:  Recent Labs  07/18/13 1403 08/05/13 1327 04/19/14  NA 144 139 142  K 3.5 4.0 3.7  CL  --   104  --   CO2 26 26  --   GLUCOSE 74 95  --   BUN 13.3 16 27*  CREATININE 0.8 0.89 1.1  CALCIUM 8.5 9.2  --     Liver Function Tests:  Recent Labs  04/19/14  AST 28  ALT 12  ALKPHOS 111    CBC:  Recent Labs  07/18/13 1402 08/05/13 1327 04/19/14  WBC 6.4 7.1 7.8  NEUTROABS 2.7 3.4  --   HGB 12.8 14.5 7.5*  HCT 38.3 41.1 23*  MCV 93.6 93.8  --   PLT 152 207 252   04/19/14 erythropoietin 51.7, esr 78  Assessment/plan  Anemia Has a huge drop of her h&h in cc a month back. Pt is hemodynamically stable. Recheck cbc. Normal epo level. Check J33 and folic acid level. Currently on ferrous sulfate 325 mg tid.   Chronic pain Persists. Has evelated ESR a month back. Recheck ESR and if elevated with concerns for PMR consider low dose prednisone. At present, continue fentanyl patch 25 mcg daily, hydrocodone-apap 5-325 1 tab q4 and then q6h prn for breakthrough pain with neurontin 300 mg tid. Monitor bowel regimen. Improved with linzess. Also check tsh  Raised esr Concern for PMR contributing to pain. noram wbc making infection less likely a cause for raised ESR. Repeat esr and if elevated, consider rx   Constipation Continue linzess and monitor  Hypokalemia Reviewed bmp. Continue kcl supplement  Depression Mood has been stable. Continue zoloft 50 mg daily. Monitor mood  gerd Stable on pantoprazole 40 mg daily, consider reducing the dose next visit  Labs- esr, tsh, folic acid, L45, cbc

## 2014-05-26 LAB — TSH: TSH: 0.66 u[IU]/mL (ref <=5.90)

## 2014-05-27 ENCOUNTER — Other Ambulatory Visit: Payer: Self-pay | Admitting: *Deleted

## 2014-06-02 ENCOUNTER — Other Ambulatory Visit: Payer: Self-pay | Admitting: *Deleted

## 2014-06-02 MED ORDER — FENTANYL 25 MCG/HR TD PT72: 25.0000 ug | MEDICATED_PATCH | TRANSDERMAL | Status: DC

## 2014-06-02 NOTE — Telephone Encounter (Signed)
[facility] 

## 2014-06-17 ENCOUNTER — Encounter: Payer: Self-pay | Admitting: Internal Medicine

## 2014-06-17 ENCOUNTER — Other Ambulatory Visit: Payer: Self-pay | Admitting: *Deleted

## 2014-06-17 MED ORDER — HYDROCODONE-ACETAMINOPHEN 10-325 MG PO TABS: ORAL_TABLET | ORAL | Status: DC

## 2014-06-17 NOTE — Progress Notes (Signed)
Patient ID: [name], female   DOB: 04-12-1925, 80 y.o.   MRN: [id number]

## 2014-06-17 NOTE — Telephone Encounter (Signed)
[facility] 

## 2014-06-17 NOTE — Progress Notes (Deleted)
Patient ID: [name], female   DOB: 12-18-1924, 80 y.o.   MRN: [id number]  Location:  [facility] Provider:  Jonelle Sidle L. Mariea Clonts, D.O., C.M.D.  Code Status:  Full code  Chief Complaint  Patient presents with  . Acute Visit    "I don't feel well and I want to see the doctor"  . Medical Management of Chronic Issues    HPI:  80 yo black female long term care resident requested to see me today.  Review of Systems:  ROS  Medications: Patient's Medications  New Prescriptions   No medications on file  Previous Medications   CARBOXYMETHYLCELLULOSE (REFRESH PLUS) 0.5 % SOLN    Place 1 drop into the right eye 3 (three) times daily.    DICLOFENAC SODIUM (VOLTAREN) 1 % GEL    Apply 4 g topically 4 (four) times daily. Bilateral knees   FENTANYL (DURAGESIC - DOSED MCG/HR) 25 MCG/HR PATCH    Place 1 patch (25 mcg total) onto the skin every 3 (three) days.   FERROUS SULFATE 325 (65 FE) MG TABLET    Take 325 mg by mouth daily with breakfast.    GABAPENTIN (NEURONTIN) 600 MG TABLET    Take 600 mg by mouth 3 (three) times daily.   HYDROCODONE-ACETAMINOPHEN (NORCO) 10-325 MG PER TABLET    Take one tablet by mouth every 4 hours for pain   HYDROCODONE-ACETAMINOPHEN (NORCO/VICODIN) 5-325 MG PER TABLET    Take one tablet by mouth every 4 hours routinely for pain; Take one tablet by mouth every 6 hours as needed for breakthrough pain. DO NOT EXCEED 3000 MG OF APAP FROM ALL SOURCES IN 24 HOURS   LINACLOTIDE (LINZESS) 145 MCG CAPS CAPSULE    Take 145 mcg by mouth daily.   MULTIPLE VITAMINS-MINERALS (DECUBI-VITE PO)    Take 1 capsule by mouth 2 (two) times daily.   PANTOPRAZOLE (PROTONIX) 40 MG TABLET    Take 40 mg by mouth daily.   POTASSIUM CHLORIDE SA (K-DUR,KLOR-CON) 20 MEQ TABLET    Take 20 mEq by mouth daily.   SERTRALINE (ZOLOFT) 50 MG TABLET    Take 50 mg by mouth daily.  Modified Medications   No medications on file  Discontinued Medications   No medications on file    Physical  Exam: Filed Vitals:   06/17/14 1423  BP: 152/83  Pulse: 74  Temp: 97.4 F (36.3 C)  Resp: 17  Height: 5\' 2"  (1.575 m)  Weight: 131 lb (59.421 kg)  SpO2: 95%  Physical Exam  Labs reviewed: Basic Metabolic Panel:  Recent Labs  07/18/13 1403 08/05/13 1327 04/19/14  NA 144 139 142  K 3.5 4.0 3.7  CL  --  104  --   CO2 26 26  --   GLUCOSE 74 95  --   BUN 13.3 16 27*  CREATININE 0.8 0.89 1.1  CALCIUM 8.5 9.2  --     Liver Function Tests:  Recent Labs  04/19/14  AST 28  ALT 12  ALKPHOS 111    CBC:  Recent Labs  07/18/13 1402 08/05/13 1327 04/19/14  WBC 6.4 7.1 7.8  NEUTROABS 2.7 3.4  --   HGB 12.8 14.5 7.5*  HCT 38.3 41.1 23*  MCV 93.6 93.8  --   PLT 152 207 252    Significant Diagnostic Results: ***  Assessment/Plan No problem-specific assessment & plan notes found for this encounter. lasix has been on hold F/u bmp tomorrow to reassess renal function and determine  what to do about lasix  Family/ staff Communication: ***  Goals of care: ***  Labs/tests ordered:  Bmp in am    This encounter was created in error - please disregard.

## 2014-06-18 ENCOUNTER — Other Ambulatory Visit: Payer: Self-pay | Admitting: Internal Medicine

## 2014-06-18 LAB — BASIC METABOLIC PANEL
BUN: 21 mg/dL (ref 6–23)
CO2: 26 mEq/L (ref 19–32)
Calcium: 8.6 mg/dL (ref 8.4–10.5)
Chloride: 104 mEq/L (ref 96–112)
Creat: 1.2 mg/dL — ABNORMAL HIGH (ref 0.50–1.10)
Glucose, Bld: 81 mg/dL (ref 70–99)
Potassium: 3.6 mEq/L (ref 3.5–5.3)
Sodium: 143 mEq/L (ref 135–145)

## 2014-06-24 ENCOUNTER — Non-Acute Institutional Stay (SKILLED_NURSING_FACILITY): Payer: Medicare Other | Admitting: Internal Medicine

## 2014-06-24 DIAGNOSIS — G894 Chronic pain syndrome: Secondary | ICD-10-CM

## 2014-06-24 DIAGNOSIS — M4712 Other spondylosis with myelopathy, cervical region: Secondary | ICD-10-CM

## 2014-06-24 DIAGNOSIS — I10 Essential (primary) hypertension: Secondary | ICD-10-CM

## 2014-06-24 NOTE — Progress Notes (Signed)
Patient ID: [name], female   DOB: 08-02-1925, 80 y.o.   MRN: [id number]  Location:  [name] Provider:  Rexene Edison. Mariea Clonts, D.O., C.M.D.  Code Status:  Full code  Chief Complaint  Patient presents with  . Acute Visit    does not feel well, c/o pain and inability to move legs and arms, but she can do this    HPI:  80 yo black female long term care resident with chronic pain from prior spinal cord compression and known DDD, neuropathy was seen for acute visit as above.  Says her current pain regimen is not giving her relief, but review of MAR indicated that prn was not being used consistently--agrees to try this.  Review of Systems:  Review of Systems  Constitutional: Positive for malaise/fatigue. Negative for fever.  HENT: Negative for hearing loss.   Eyes: Negative for blurred vision.       Glasses  Respiratory: Negative for shortness of breath.   Cardiovascular: Negative for chest pain and leg swelling.  Gastrointestinal: Positive for constipation. Negative for abdominal pain, blood in stool and melena.  Genitourinary: Negative for dysuria.  Musculoskeletal: Positive for myalgias, back pain, joint pain and neck pain. Negative for falls.  Skin: Negative for rash.  Neurological: Positive for weakness. Negative for dizziness, loss of consciousness and headaches.  Psychiatric/Behavioral: Positive for depression and memory loss.    Medications: Patient's Medications  New Prescriptions   No medications on file  Previous Medications   CALCIUM PO    Take 60 mg by mouth 2 (two) times daily.   CARBOXYMETHYLCELLULOSE (REFRESH PLUS) 0.5 % SOLN    Place 1 drop into the right eye 3 (three) times daily.    DICLOFENAC SODIUM (VOLTAREN) 1 % GEL    Apply 4 g topically 4 (four) times daily. Bilateral knees   FERROUS SULFATE 325 (65 FE) MG TABLET    Take 325 mg by mouth 3 (three) times daily with meals.    FLUTICASONE (FLONASE) 50 MCG/ACT NASAL SPRAY    Place 1 spray into both  nostrils daily as needed for allergies or rhinitis (congestion).   GABAPENTIN (NEURONTIN) 600 MG TABLET    Take 600 mg by mouth 3 (three) times daily.   LINACLOTIDE (LINZESS) 145 MCG CAPS CAPSULE    Take 145 mcg by mouth daily.   LOPERAMIDE (IMODIUM) 2 MG CAPSULE    Take 2 mg by mouth as needed for diarrhea or loose stools.   MULTIPLE VITAMINS-MINERALS (DECUBI-VITE PO)    Take 1 capsule by mouth daily.    PANTOPRAZOLE (PROTONIX) 40 MG TABLET    Take 40 mg by mouth daily.   SERTRALINE (ZOLOFT) 25 MG TABLET    Take 75 mg by mouth daily.  Modified Medications   Modified Medication Previous Medication   FENTANYL (DURAGESIC - DOSED MCG/HR) 25 MCG/HR PATCH fentaNYL (DURAGESIC - DOSED MCG/HR) 25 MCG/HR patch      Place 1 patch (25 mcg total) onto the skin every 3 (three) days.    Place 1 patch (25 mcg total) onto the skin every 3 (three) days.   HYDROCODONE-ACETAMINOPHEN (NORCO) 10-325 MG PER TABLET HYDROcodone-acetaminophen (NORCO) 10-325 MG per tablet      Take one tablet by mouth every 4 hours for pain. Not to exceed 3gm apap from all sources/24hr    Take one tablet by mouth every 4 hours for pain. Not to exceed 3gm apap from all sources/24hr   HYDROCODONE-ACETAMINOPHEN (NORCO/VICODIN) 5-325 MG PER TABLET HYDROcodone-acetaminophen (  NORCO/VICODIN) 5-325 MG per tablet      Take 1 tablet by mouth every 6 (six) hours as needed (Breakthrough pain.).    Take one tablet by mouth every 4 hours routinely for pain; Take one tablet by mouth every 6 hours as needed for breakthrough pain. DO NOT EXCEED 3000 MG OF APAP FROM ALL SOURCES IN 24 HOURS  Discontinued Medications   FENTANYL (DURAGESIC - DOSED MCG/HR) 25 MCG/HR PATCH    Place 1 patch (25 mcg total) onto the skin every 3 (three) days.   HYDROCODONE-ACETAMINOPHEN (NORCO) 10-325 MG PER TABLET    Take one tablet by mouth every 4 hours for pain   POTASSIUM CHLORIDE SA (K-DUR,KLOR-CON) 20 MEQ TABLET    Take 20 mEq by mouth daily.   SERTRALINE (ZOLOFT) 50 MG  TABLET    Take 50 mg by mouth daily.    Physical Exam: Filed Vitals:   06/24/14 1126  BP: 152/83  Pulse: 74  Temp: 97.4 F (36.3 C)  Resp: 17  Height: 5\' 2"  (1.575 m)  Weight: 134 lb (60.782 kg)  SpO2: 95%  Physical Exam  Constitutional: She is oriented to person, place, and time.  Cardiovascular: Normal rate, regular rhythm, normal heart sounds and intact distal pulses.   Pulmonary/Chest: Effort normal and breath sounds normal. No respiratory distress.  Abdominal: Soft. Bowel sounds are normal. She exhibits no distension and no mass. There is no tenderness.  Musculoskeletal:  Increased weakness of leg;  diminished strength in bilateral LEs, uses power wheelchair to get around  Neurological: She is alert and oriented to person, place, and time.  Demonstrates that pain shoots down both arms when she lifts them, but she is able to elevate them and keep them up  Skin: Skin is warm and dry.  Psychiatric: She has a normal mood and affect.    Labs reviewed: Basic Metabolic Panel:  Recent Labs  10/24/14 0435 10/28/14 0500 10/29/14 0402  NA 138 137 137  K 3.8 3.9 4.1  CL 104 104 105  CO2 27 27 27   GLUCOSE 92 95 92  BUN 17 20 25*  CREATININE 0.69 0.81 0.80  CALCIUM 7.7* 7.7* 7.7*    Liver Function Tests:  Recent Labs  04/19/14 10/19/14 1843  AST 28 73*  ALT 12 20  ALKPHOS 111 139*  BILITOT  --  0.7  PROT  --  8.6*  ALBUMIN  --  3.2*    CBC:  Recent Labs  06/28/14 2035 10/19/14 1959  10/28/14 0500 10/29/14 0402 10/30/14 0706  WBC 9.7 10.8*  < > 18.0* 15.8* 14.9*  NEUTROABS 5.6 7.8*  --   --   --   --   HGB 10.6* 9.7*  < > 8.2* 7.6* 7.9*  HCT 32.9* 32.5*  < > 26.2* 24.7* 25.0*  MCV 91.4 96.2  < > 88.5 90.1 89.3  PLT 226 214  < > 330 310 379  < > = values in this interval not displayed. MRI cervical spine w/ and w/o contrast 08/17/11:   1. Diffuse cervical spondylosis. Facet arthritis is most severe at C2-3 and C3-4 on the left. 2. Diffuse  degenerative disc disease with multilevel foraminal stenosis. 3. Disc protrusions into the neural foramina bilaterally at T1-2, left greater than right which could affect the T1 nerve roots. 4. No cord compression or myelopathy. The only significant enhancement is of the endplates at P3-7 and T0-2 secondary to degenerative disc disease.  Assessment/Plan 1. Chronic pain syndrome -has previously had  spinal cord compression and has had imaging of her neck, as well, that shows cervical spondylosis and DDD with protrusions but no compression -cont fentanyl patch, prn norco q4h (not using) and max dose gabapentin 600mg  po tid--try using the hydrocodone more consistently -if this remains ineffective, consider d/c fentanyl patch and using ms contin or oxycontin at a low dose--renal function is wnl at present  2. Spondylosis, cervical, without myelopathy -per 2012 imaging of neck -may have progressed -does not seem like a good surgical candidate at this point -could also consider lyrica or cymbalta for pain   3. Essential hypertension, benign -bp was elevated even for her age group today, likely secondary to her pain   Family/ staff Communication: seen with unit supervisor  Goals of care: long term care resident, remains full code  Labs/tests ordered:  No new

## 2014-06-25 ENCOUNTER — Other Ambulatory Visit: Payer: Self-pay | Admitting: Internal Medicine

## 2014-06-25 LAB — SEDIMENTATION RATE: Sed Rate: 71 mm/hr — ABNORMAL HIGH (ref 0–22)

## 2014-06-28 ENCOUNTER — Emergency Department (HOSPITAL_COMMUNITY): Payer: Medicare Other

## 2014-06-28 ENCOUNTER — Emergency Department (HOSPITAL_COMMUNITY)
Admission: EM | Admit: 2014-06-28 | Discharge: 2014-06-29 | Disposition: A | Discharge status: Home / Self Care | Payer: Medicare Other | Attending: Emergency Medicine | Admitting: Emergency Medicine

## 2014-06-28 ENCOUNTER — Encounter (HOSPITAL_COMMUNITY): Payer: Self-pay | Admitting: Emergency Medicine

## 2014-06-28 DIAGNOSIS — F329 Major depressive disorder, single episode, unspecified: Secondary | ICD-10-CM | POA: Diagnosis not present

## 2014-06-28 DIAGNOSIS — S0180XA Unspecified open wound of other part of head, initial encounter: Secondary | ICD-10-CM | POA: Insufficient documentation

## 2014-06-28 DIAGNOSIS — M810 Age-related osteoporosis without current pathological fracture: Secondary | ICD-10-CM | POA: Diagnosis not present

## 2014-06-28 DIAGNOSIS — G894 Chronic pain syndrome: Secondary | ICD-10-CM | POA: Insufficient documentation

## 2014-06-28 DIAGNOSIS — Z8673 Personal history of transient ischemic attack (TIA), and cerebral infarction without residual deficits: Secondary | ICD-10-CM | POA: Insufficient documentation

## 2014-06-28 DIAGNOSIS — Z23 Encounter for immunization: Secondary | ICD-10-CM | POA: Insufficient documentation

## 2014-06-28 DIAGNOSIS — Z79899 Other long term (current) drug therapy: Secondary | ICD-10-CM | POA: Insufficient documentation

## 2014-06-28 DIAGNOSIS — D539 Nutritional anemia, unspecified: Secondary | ICD-10-CM | POA: Diagnosis not present

## 2014-06-28 DIAGNOSIS — S6990XA Unspecified injury of unspecified wrist, hand and finger(s), initial encounter: Secondary | ICD-10-CM

## 2014-06-28 DIAGNOSIS — S59919A Unspecified injury of unspecified forearm, initial encounter: Secondary | ICD-10-CM

## 2014-06-28 DIAGNOSIS — Z791 Long term (current) use of non-steroidal anti-inflammatories (NSAID): Secondary | ICD-10-CM | POA: Insufficient documentation

## 2014-06-28 DIAGNOSIS — Y9210 Unspecified residential institution as the place of occurrence of the external cause: Secondary | ICD-10-CM | POA: Insufficient documentation

## 2014-06-28 DIAGNOSIS — S59909A Unspecified injury of unspecified elbow, initial encounter: Secondary | ICD-10-CM | POA: Insufficient documentation

## 2014-06-28 DIAGNOSIS — Y9389 Activity, other specified: Secondary | ICD-10-CM | POA: Diagnosis not present

## 2014-06-28 DIAGNOSIS — G2581 Restless legs syndrome: Secondary | ICD-10-CM | POA: Diagnosis not present

## 2014-06-28 DIAGNOSIS — I10 Essential (primary) hypertension: Secondary | ICD-10-CM | POA: Diagnosis not present

## 2014-06-28 DIAGNOSIS — Z87448 Personal history of other diseases of urinary system: Secondary | ICD-10-CM | POA: Diagnosis not present

## 2014-06-28 DIAGNOSIS — E875 Hyperkalemia: Secondary | ICD-10-CM | POA: Insufficient documentation

## 2014-06-28 DIAGNOSIS — S52609A Unspecified fracture of lower end of unspecified ulna, initial encounter for closed fracture: Secondary | ICD-10-CM | POA: Insufficient documentation

## 2014-06-28 DIAGNOSIS — IMO0002 Reserved for concepts with insufficient information to code with codable children: Secondary | ICD-10-CM | POA: Diagnosis not present

## 2014-06-28 DIAGNOSIS — S52612A Displaced fracture of left ulna styloid process, initial encounter for closed fracture: Secondary | ICD-10-CM

## 2014-06-28 DIAGNOSIS — W07XXXA Fall from chair, initial encounter: Secondary | ICD-10-CM | POA: Insufficient documentation

## 2014-06-28 DIAGNOSIS — M19019 Primary osteoarthritis, unspecified shoulder: Secondary | ICD-10-CM | POA: Diagnosis not present

## 2014-06-28 DIAGNOSIS — T148XXA Other injury of unspecified body region, initial encounter: Secondary | ICD-10-CM

## 2014-06-28 DIAGNOSIS — F3289 Other specified depressive episodes: Secondary | ICD-10-CM | POA: Insufficient documentation

## 2014-06-28 DIAGNOSIS — W19XXXA Unspecified fall, initial encounter: Secondary | ICD-10-CM

## 2014-06-28 LAB — CBC WITH DIFFERENTIAL/PLATELET
Basophils Absolute: 0 10*3/uL (ref 0.0–0.1)
Basophils Relative: 0 % (ref 0–1)
EOS PCT: 3 % (ref 0–5)
Eosinophils Absolute: 0.3 10*3/uL (ref 0.0–0.7)
HEMATOCRIT: 32.9 % — AB (ref 36.0–46.0)
Hemoglobin: 10.6 g/dL — ABNORMAL LOW (ref 12.0–15.0)
LYMPHS ABS: 2.9 10*3/uL (ref 0.7–4.0)
Lymphocytes Relative: 30 % (ref 12–46)
MCH: 29.4 pg (ref 26.0–34.0)
MCHC: 32.2 g/dL (ref 30.0–36.0)
MCV: 91.4 fL (ref 78.0–100.0)
Monocytes Absolute: 0.8 10*3/uL (ref 0.1–1.0)
Monocytes Relative: 8 % (ref 3–12)
Neutro Abs: 5.6 10*3/uL (ref 1.7–7.7)
Neutrophils Relative %: 59 % (ref 43–77)
PLATELETS: 226 10*3/uL (ref 150–400)
RBC: 3.6 MIL/uL — AB (ref 3.87–5.11)
RDW: 15.4 % (ref 11.5–15.5)
WBC: 9.7 10*3/uL (ref 4.0–10.5)

## 2014-06-28 LAB — BASIC METABOLIC PANEL
Anion gap: 14 (ref 5–15)
BUN: 26 mg/dL — ABNORMAL HIGH (ref 6–23)
CALCIUM: 8.8 mg/dL (ref 8.4–10.5)
CO2: 26 mEq/L (ref 19–32)
CREATININE: 0.79 mg/dL (ref 0.50–1.10)
Chloride: 104 mEq/L (ref 96–112)
GFR calc Af Amer: 84 mL/min — ABNORMAL LOW (ref >90)
GFR calc non Af Amer: 73 mL/min — ABNORMAL LOW (ref >90)
GLUCOSE: 89 mg/dL (ref 70–99)
Potassium: 3.7 mEq/L (ref 3.7–5.3)
SODIUM: 144 meq/L (ref 137–147)

## 2014-06-28 LAB — URINALYSIS, ROUTINE W REFLEX MICROSCOPIC
BILIRUBIN URINE: NEGATIVE
GLUCOSE, UA: NEGATIVE mg/dL
Hgb urine dipstick: NEGATIVE
KETONES UR: NEGATIVE mg/dL
Leukocytes, UA: NEGATIVE
Nitrite: NEGATIVE
PH: 7 (ref 5.0–8.0)
PROTEIN: NEGATIVE mg/dL
Specific Gravity, Urine: 1.01 (ref 1.005–1.030)
Urobilinogen, UA: 1 mg/dL (ref 0.0–1.0)

## 2014-06-28 MED ORDER — TRAMADOL HCL 50 MG PO TABS
50.0000 mg | ORAL_TABLET | Freq: Once | ORAL | Status: AC
  Administered 2014-06-28: 50 mg via ORAL
  Filled 2014-06-28: qty 1

## 2014-06-28 MED ORDER — TETANUS-DIPHTH-ACELL PERTUSSIS 5-2.5-18.5 LF-MCG/0.5 IM SUSP
0.5000 mL | Freq: Once | INTRAMUSCULAR | Status: AC
  Administered 2014-06-28: 0.5 mL via INTRAMUSCULAR
  Filled 2014-06-28: qty 0.5

## 2014-06-28 MED ORDER — SODIUM CHLORIDE 0.9 % IV BOLUS (SEPSIS)
500.0000 mL | Freq: Once | INTRAVENOUS | Status: AC
  Administered 2014-06-28: 500 mL via INTRAVENOUS

## 2014-06-28 NOTE — ED Notes (Addendum)
Pt is from [facility]. Pt had a witnessed fall while getting out of a chair. Pt has a skin tear to the right forehead. Pt is complaining of pain in her left pinky and index finger as well as neck pain. Staff at facility reports the patient is at cognitive baseline.

## 2014-06-28 NOTE — ED Provider Notes (Addendum)
CSN: [id number]     Arrival date & time 06/28/14  1934 History   First MD Initiated Contact with Patient 06/28/14 1939     Chief Complaint  Patient presents with  . Fall     (Consider location/radiation/quality/duration/timing/severity/associated sxs/prior Treatment) Patient is a 80 y.o. female presenting with fall. The history is provided by the patient.  Fall This is a new problem. The current episode started less than 1 hour ago. Episode frequency: once. The problem has been resolved. Pertinent negatives include no chest pain, no abdominal pain, no headaches and no shortness of breath. Nothing aggravates the symptoms. Nothing relieves the symptoms. She has tried nothing for the symptoms. The treatment provided no relief.    Past Medical History  Diagnosis Date  . Hypertension   . Head ache   . Renal insufficiency, mild   . Depression   . Fecal occult blood test positive   . Restless leg syndrome   . Anemia   . Cataracts, bilateral   . Hemorrhoids   . Rupture of rotator cuff of shoulder     s/p repair by Dr. Berenice Primas 7/07. Follows with Dr. Tamera Punt for possible shoulder surgery.   . Lumbar spinal stenosis     gets Spinal injections by Dr. Marlaine Hind   . Postnasal drip   . Atrial tachycardia     echo 04/08/11: EF 55-60%, mild LVH, grade 1 diast dysfxn;    s/p RFCA with Dr. Lovena Le  04/09/11  . Hyperkalemia 04/08/2011  . Unspecified deficiency anemia   . Osteoporosis, unspecified 04/09/2013    DEXA 5/07 : T L forearm -2.9. L femur -0.6, R femur -1.4. [name]  . CVA (cerebral infarction) 04/09/2013    MRI 2005 : Multiple tiny old lacunar infarcts as well as scattered white matter small vessel ischemic disease. Pt asymptomatic and denies TIA / CVA hx.    . Chronic pain syndrome 04/09/2013    On chronic opioids. Cervical MRI 11/ 2012 : Diffuse cervical spondylosis, Diffuse degenerative disc disease with multilevel foraminal stenosis. Possible T1 nerve root involvement.  Lumbar MRI 2009 : prior fusion at L3-4 and degenerative disc disease Rotator cuff repair : Dr. Berenice Primas 7/07. Follows with Dr. Tamera Punt for possible shoulder surgery.  H/O lumbar spinal stenosis with steroid injections. Mo  . Arthritis     b/l shoulder R>L  . Rotator cuff tear arthropathy     03/22/13 dx [facility] (Dr. Tamera Punt) considering surgery  . Stroke   . History of blood transfusion 2014    anemia-    Past Surgical History  Procedure Laterality Date  . Doppler echocardiography  2004  . Rotator cuff repair      right  . Back surgery      lumbar x 2, 2000 and 2004 by Dr. Deri Fuelling.   . Eye surgery Bilateral     cataract  . Abdominal hysterectomy    . Orif finger fracture Left     ring finger  . Lumbar laminectomy/decompression microdiscectomy Left 08/06/2013    Procedure: Left Thoracic eleven-twelve microdiskectomy ;  Surgeon: Charlie Pitter, MD;  Location: [facility];  Service: Neurosurgery;  Laterality: Left;  Left Thoracic eleven-twelve microdiskectomy    Family History  Problem Relation Age of Onset  . Coronary artery disease Neg Hx     premature   History  Substance Use Topics  . Smoking status: Never Smoker   . Smokeless tobacco: Not on file  . Alcohol  Use: No   OB History   Grav Para Term Preterm Abortions TAB SAB Ect Mult Living                 Review of Systems  Constitutional: Negative for fever and fatigue.  HENT: Negative for congestion and drooling.   Eyes: Negative for pain.  Respiratory: Negative for cough and shortness of breath.   Cardiovascular: Negative for chest pain.  Gastrointestinal: Negative for nausea, vomiting, abdominal pain and diarrhea.  Genitourinary: Negative for dysuria and hematuria.  Musculoskeletal: Negative for back pain, gait problem and neck pain.  Skin: Negative for color change.  Neurological: Negative for dizziness and headaches.  Hematological: Negative for adenopathy.  Psychiatric/Behavioral: Negative for  behavioral problems.  All other systems reviewed and are negative.     Allergies  Codeine and Ibuprofen  Home Medications   Prior to Admission medications   Medication Sig Start Date End Date Taking? Authorizing Provider  fentaNYL (DURAGESIC - DOSED MCG/HR) 25 MCG/HR patch Place 1 patch (25 mcg total) onto the skin every 3 (three) days. 06/02/14  Yes Tiffany L Reed, DO  Linaclotide (LINZESS) 145 MCG CAPS capsule Take 145 mcg by mouth daily.   Yes Historical Provider, MD  carboxymethylcellulose (REFRESH PLUS) 0.5 % SOLN Place 1 drop into the right eye 3 (three) times daily.     Historical Provider, MD  diclofenac sodium (VOLTAREN) 1 % GEL Apply 4 g topically 4 (four) times daily. Bilateral knees    Historical Provider, MD  ferrous sulfate 325 (65 FE) MG tablet Take 325 mg by mouth daily with breakfast.     Historical Provider, MD  gabapentin (NEURONTIN) 600 MG tablet Take 600 mg by mouth 3 (three) times daily.    Historical Provider, MD  HYDROcodone-acetaminophen [facility]) 10-325 MG per tablet Take one tablet by mouth every 4 hours for pain 06/17/14   Blanchie Serve, MD  HYDROcodone-acetaminophen (NORCO/VICODIN) 5-325 MG per tablet Take one tablet by mouth every 4 hours routinely for pain; Take one tablet by mouth every 6 hours as needed for breakthrough pain. DO NOT EXCEED 3000 MG OF APAP FROM ALL SOURCES IN 24 HOURS 12/03/13   Estill Dooms, MD  Multiple Vitamins-Minerals (DECUBI-VITE PO) Take 1 capsule by mouth 2 (two) times daily.    Historical Provider, MD  pantoprazole (PROTONIX) 40 MG tablet Take 40 mg by mouth daily.    Historical Provider, MD  potassium chloride SA (K-DUR,KLOR-CON) 20 MEQ tablet Take 20 mEq by mouth daily.    Historical Provider, MD   BP 181/87  Pulse 79  Temp(Src) 98.6 F (37 C) (Oral)  Resp 18  SpO2 98% Physical Exam  Nursing note and vitals reviewed. Constitutional: She appears well-developed and well-nourished.  HENT:  Head: Normocephalic and atraumatic.   Mouth/Throat: Oropharynx is clear and moist. No oropharyngeal exudate.  Superficial 2cm skin tear to central for head.  Eyes: Conjunctivae and EOM are normal. Pupils are equal, round, and reactive to light.  Neck: Normal range of motion. Neck supple.  Cardiovascular: Normal rate, regular rhythm, normal heart sounds and intact distal pulses.  Exam reveals no gallop and no friction rub.   No murmur heard. Pulmonary/Chest: Effort normal and breath sounds normal. No respiratory distress. She has no wheezes.  Abdominal: Soft. Bowel sounds are normal. There is no tenderness. There is no rebound and no guarding.  Musculoskeletal: Normal range of motion. She exhibits no edema and no tenderness.  No focal tenderness to palpation of her lower  extremities or hips.  Abrasion to the plantar aspect of her left first toe.  Nonspecific diffuse pain to her left wrist pain the fourth and fifth digits of her left hand.  Nonspecific pain to the right hand which does not localize.  Mild midline lower lumbar tenderness to palpation.  Neurological: She is alert.  Skin: Skin is warm and dry.  Psychiatric: She has a normal mood and affect. Her behavior is normal.    ED Course  LACERATION REPAIR Date/Time: 06/29/2014 11:58 AM Performed by: Pamella Pert Authorized by: Pamella Pert Consent: Verbal consent obtained. Risks and benefits: risks, benefits and alternatives were discussed Consent given by: patient Patient identity confirmed: verbally with patient, arm band, provided demographic data and hospital-assigned identification number Time out: Immediately prior to procedure a "time out" was called to verify the correct patient, procedure, equipment, support staff and site/side marked as required. Body area: head/neck Location details: forehead Laceration length: 2 cm Foreign bodies: no foreign bodies Tendon involvement: none Nerve involvement: none Vascular damage: no Irrigation solution:  Safe clens spray. Skin closure: glue Patient tolerance: Patient tolerated the procedure well with no immediate complications.   (including critical care time) Labs Review Labs Reviewed  CBC WITH DIFFERENTIAL - Abnormal; Notable for the following:    RBC 3.60 (*)    Hemoglobin 10.6 (*)    HCT 32.9 (*)    All other components within normal limits  BASIC METABOLIC PANEL - Abnormal; Notable for the following:    BUN 26 (*)    GFR calc non Af Amer 73 (*)    GFR calc Af Amer 84 (*)    All other components within normal limits  URINALYSIS, ROUTINE W REFLEX MICROSCOPIC    Imaging Review Dg Chest 1 View  06/28/2014   CLINICAL DATA:  Status post fall  EXAM: CHEST - 1 VIEW  COMPARISON:  Chest radiograph 08/05/2013  FINDINGS: Stable cardiac and mediastinal contours. Tortuosity of the thoracic aorta. Minimal heterogeneous opacities left lung base. No pleural effusion or pneumothorax. Left glenohumeral joint degenerative change. Lower lumbar spine degenerative change.  IMPRESSION: Minimal heterogeneous opacities left lung base may represent atelectasis or infection.   Electronically Signed   By: Lovey Newcomer M.D.   On: 06/28/2014 21:54   Dg Lumbar Spine Complete  06/28/2014   CLINICAL DATA:  Fall.  Lumbar back pain.  EXAM: LUMBAR SPINE - COMPLETE 4+ VIEW  COMPARISON:  12/17/2012.  FINDINGS: No acute fracture. Previous posterior finding fusion at L3-L4. Orthopedic hardware is well-seated. There is an anterolisthesis of L4 on L5, stable. Disc degenerative changes are noted throughout the lumbar spine. Moderate dextroscoliosis is noted with the apex at L 1-L2.  Bones are diffusely demineralized.  Soft tissues are unremarkable.  No significant change from the prior study.  IMPRESSION: 1. No fracture or acute finding. 2. Postsurgical and degenerative changes as well as a dextroscoliosis without significant change from the prior study.   Electronically Signed   By: Lajean Manes M.D.   On: 06/28/2014 22:00    Dg Pelvis 1-2 Views  06/28/2014   CLINICAL DATA:  Status post fall; concern for pelvic injury.  EXAM: PELVIS - 1-2 VIEW  COMPARISON:  Pelvis radiograph performed 12/17/2012  FINDINGS: There is no evidence of fracture or dislocation. Both femoral heads are seated normally within their respective acetabula. The patient is status post lumbar spinal fusion at L3-L4. The sacroiliac joints are unremarkable in appearance.  The visualized bowel gas pattern is grossly unremarkable in  appearance.  IMPRESSION: No evidence of fracture or dislocation.   Electronically Signed   By: Garald Balding M.D.   On: 06/28/2014 21:57   Dg Wrist Complete Left  06/28/2014   CLINICAL DATA:  Fall  EXAM: LEFT WRIST - COMPLETE 3+ VIEW  COMPARISON:  Radiograph 06/28/2014  FINDINGS: No distal radius or ulnar fracture. Radiocarpal joint is intact. No carpal fracture. No soft tissue abnormality. The fragmentation at the ulnar styloid cyst remote avulsion fracture.  IMPRESSION: No acute fracture of the left wrist.   Electronically Signed   By: Suzy Bouchard M.D.   On: 06/28/2014 22:00   Ct Head Wo Contrast  06/28/2014   CLINICAL DATA:  Witnessed fall getting out of chair. Forehead laceration. Neck pain.  EXAM: CT HEAD WITHOUT CONTRAST  CT CERVICAL SPINE WITHOUT CONTRAST  TECHNIQUE: Multidetector CT imaging of the head and cervical spine was performed following the standard protocol without intravenous contrast. Multiplanar CT image reconstructions of the cervical spine were also generated.  COMPARISON:  None.  FINDINGS: CT HEAD FINDINGS  Small scalp laceration over the left orbit. No intracranial hemorrhage. No parenchymal contusion. No midline shift or mass effect. Basilar cisterns are patent. No skull base fracture. No fluid in the paranasal sinuses or mastoid air cells. Orbits are normal.  Diffuse the cortical atrophy and mild periventricular white matter hypodensities.  CT CERVICAL SPINE FINDINGS  No prevertebral soft tissue  swelling. Normal alignment of cervical vertebral bodies. No loss of vertebral body height. Normal facet articulation. Normal craniocervical junction.  No evidence epidural or paraspinal hematoma.  There are multiple levels of endplate spurring and joint space narrowing.  IMPRESSION: 1. No intracranial trauma. 2. Small left frontal scalp hematoma. 3. No cervical spine fracture. 4. Severe multilevel disc osteophytic disease of the cervical spine   Electronically Signed   By: Suzy Bouchard M.D.   On: 06/28/2014 21:24   Ct Cervical Spine Wo Contrast  06/28/2014   CLINICAL DATA:  Witnessed fall getting out of chair. Forehead laceration. Neck pain.  EXAM: CT HEAD WITHOUT CONTRAST  CT CERVICAL SPINE WITHOUT CONTRAST  TECHNIQUE: Multidetector CT imaging of the head and cervical spine was performed following the standard protocol without intravenous contrast. Multiplanar CT image reconstructions of the cervical spine were also generated.  COMPARISON:  None.  FINDINGS: CT HEAD FINDINGS  Small scalp laceration over the left orbit. No intracranial hemorrhage. No parenchymal contusion. No midline shift or mass effect. Basilar cisterns are patent. No skull base fracture. No fluid in the paranasal sinuses or mastoid air cells. Orbits are normal.  Diffuse the cortical atrophy and mild periventricular white matter hypodensities.  CT CERVICAL SPINE FINDINGS  No prevertebral soft tissue swelling. Normal alignment of cervical vertebral bodies. No loss of vertebral body height. Normal facet articulation. Normal craniocervical junction.  No evidence epidural or paraspinal hematoma.  There are multiple levels of endplate spurring and joint space narrowing.  IMPRESSION: 1. No intracranial trauma. 2. Small left frontal scalp hematoma. 3. No cervical spine fracture. 4. Severe multilevel disc osteophytic disease of the cervical spine   Electronically Signed   By: Suzy Bouchard M.D.   On: 06/28/2014 21:24   Dg Hand Complete  Left  06/28/2014   CLINICAL DATA:  Pain.  Fall.  EXAM: LEFT HAND - COMPLETE 3+ VIEW  COMPARISON:  None.  FINDINGS: No acute fracture. No dislocation. Status post ORIF of a fourth metacarpal fracture.  Bones are demineralized. There are mild degenerative changes involving multiple interphalangeal  joints.  Ulnar styloid fracture, presumed old lesser is acute tenderness in this location.  Soft tissues are unremarkable.  IMPRESSION: No acute left hand fracture or dislocation.  Ulnar styloid fracture presumed chronic but possibly acute if this correlates with pain.   Electronically Signed   By: Lajean Manes M.D.   On: 06/28/2014 21:57   Dg Hand Complete Right  06/28/2014   CLINICAL DATA:  Status post fall; right hand pain.  EXAM: RIGHT HAND - COMPLETE 3+ VIEW  COMPARISON:  None.  FINDINGS: There is no evidence of fracture or dislocation. The joint spaces are preserved; the soft tissues are unremarkable in appearance. The carpal rows are intact, and demonstrate normal alignment. A peripheral IV catheter is seen overlying the radial aspect of the wrist.  IMPRESSION: No evidence of fracture or dislocation.   Electronically Signed   By: Garald Balding M.D.   On: 06/28/2014 21:58     EKG Interpretation   Date/Time:  Saturday June 28 2014 20:30:47 EDT Ventricular Rate:  81 PR Interval:  130 QRS Duration: 78 QT Interval:  480 QTC Calculation: 557 R Axis:   64 Text Interpretation:  Sinus rhythm Probable left atrial enlargement  Prolonged QT interval non-specific t wave changes Confirmed by Loucile Posner   MD, Estuardo Frisbee ([id number]) on 06/29/2014 12:02:16 PM      MDM   Final diagnoses:  Fracture of ulnar styloid, left, closed, initial encounter  Fall, initial encounter  Abrasion    8:15 PM 80 y.o. female who presents with witnessed fall while getting out of a chair at [facility]. Facility reporting that cognitively she was at her baseline. She is a poor historian and is difficult to elicit a history from  her. Her vital signs are unremarkable here. Will get screening imaging.  11:04 PM: Questionable old left ulnar styloid fx. Pt is tender there. Will place in splint. Labs/imaging otherwise non-contrib.  I have discussed the diagnosis/risks/treatment options with the patient and believe the pt to be eligible for discharge home to follow-up with her pcp as needed. We also discussed returning to the ED immediately if new or worsening sx occur. We discussed the sx which are most concerning (e.g., further falls, worsening pain) that necessitate immediate return. Medications administered to the patient during their visit and any new prescriptions provided to the patient are listed below.  Medications given during this visit Medications  sodium chloride 0.9 % bolus 500 mL (0 mLs Intravenous Stopped 06/28/14 2142)  Tdap (BOOSTRIX) injection 0.5 mL (0.5 mLs Intramuscular Given 06/28/14 2144)  traMADol (ULTRAM) tablet 50 mg (50 mg Oral Given 06/28/14 2143)    New Prescriptions   No medications on file     Pamella Pert, MD 06/29/14 [place], MD 06/29/14 1202

## 2014-06-28 NOTE — Discharge Instructions (Signed)
The patient was evaluated for her fall in the ER. We performed a CT of her head, neck. She also had a screening chest x-ray, pelvis x-ray, and x-rays of her hands bilaterally. She was found to have a left ulnar styloid fracture in her left wrist but there is some suspicion that this fracture is chronic and not a new injury. She did have pain in this area so we went ahead and splinted her. She can followup with her normal primary care Dr. for further evaluation of this injury. Her lab work and urinalysis was otherwise noncontributory. She can take normal over-the-counter medicines for her pain such as Tylenol.

## 2014-07-01 ENCOUNTER — Non-Acute Institutional Stay (SKILLED_NURSING_FACILITY): Payer: Medicare Other | Admitting: Internal Medicine

## 2014-07-01 ENCOUNTER — Encounter: Payer: Self-pay | Admitting: Internal Medicine

## 2014-07-01 DIAGNOSIS — G629 Polyneuropathy, unspecified: Secondary | ICD-10-CM

## 2014-07-01 DIAGNOSIS — W050XXA Fall from non-moving wheelchair, initial encounter: Secondary | ICD-10-CM

## 2014-07-01 DIAGNOSIS — Z5189 Encounter for other specified aftercare: Secondary | ICD-10-CM

## 2014-07-01 DIAGNOSIS — W050XXD Fall from non-moving wheelchair, subsequent encounter: Secondary | ICD-10-CM

## 2014-07-01 DIAGNOSIS — G609 Hereditary and idiopathic neuropathy, unspecified: Secondary | ICD-10-CM

## 2014-07-01 DIAGNOSIS — S52612K Displaced fracture of left ulna styloid process, subsequent encounter for closed fracture with nonunion: Secondary | ICD-10-CM

## 2014-07-01 DIAGNOSIS — IMO0002 Reserved for concepts with insufficient information to code with codable children: Secondary | ICD-10-CM

## 2014-07-01 DIAGNOSIS — M5030 Other cervical disc degeneration, unspecified cervical region: Secondary | ICD-10-CM

## 2014-07-01 DIAGNOSIS — G894 Chronic pain syndrome: Secondary | ICD-10-CM

## 2014-07-01 NOTE — Progress Notes (Signed)
Patient ID: [name], female   DOB: [birth year], 80 y.o.   MRN: [id number]  Location:  [facility] Provider:  Jonelle Sidle L. Mariea Clonts, D.O., C.M.D.  Code Status:  Full code  Chief Complaint  Patient presents with  . Acute Visit    ED f/u visit after fall out of wheelchair;  xray with old left ulnar styloid fx, pt was put in a brace for this due to acute pain there    HPI:  80 yo black female here for long term care after hospitalization for spinal cord compression.  She was seen today due to fall and ED visit where xray showed ulnar styloid avulsion fx on left that appeared old.  Pt was c/o pain there but complains of pain everywhere all of the time.  Last time, I was concerned she could have PMR and checked her ESR which was elevated at 71 but not incredibly high as it typically is in that condition.  She is on high doses of gabapentin due to her cervical OA with osteophytes which was again identified on her CT neck.     Review of Systems:  Review of Systems  Constitutional: Negative for fever.  HENT: Negative for congestion.   Eyes: Negative for blurred vision.  Respiratory: Negative for shortness of breath.   Cardiovascular: Negative for chest pain and leg swelling.  Gastrointestinal: Positive for constipation.  Musculoskeletal: Positive for falls.  Neurological: Positive for dizziness and weakness. Negative for loss of consciousness.  Psychiatric/Behavioral: Positive for depression and memory loss.    Medications: Patient's Medications  New Prescriptions   No medications on file  Previous Medications   CARBOXYMETHYLCELLULOSE (REFRESH PLUS) 0.5 % SOLN    Place 1 drop into the right eye 3 (three) times daily.    DICLOFENAC SODIUM (VOLTAREN) 1 % GEL    Apply 4 g topically 4 (four) times daily. Bilateral knees   FENTANYL (DURAGESIC - DOSED MCG/HR) 25 MCG/HR PATCH    Place 1 patch (25 mcg total) onto the skin every 3 (three) days.   FERROUS SULFATE 325 (65 FE) MG TABLET     Take 325 mg by mouth daily with breakfast.    GABAPENTIN (NEURONTIN) 600 MG TABLET    Take 600 mg by mouth 3 (three) times daily.   HYDROCODONE-ACETAMINOPHEN (NORCO) 10-325 MG PER TABLET    Take one tablet by mouth every 4 hours for pain   HYDROCODONE-ACETAMINOPHEN (NORCO/VICODIN) 5-325 MG PER TABLET    Take 1 tablet by mouth every 6 (six) hours as needed (Breakthrough pain.).   LINACLOTIDE (LINZESS) 145 MCG CAPS CAPSULE    Take 145 mcg by mouth daily.   LOPERAMIDE (IMODIUM) 2 MG CAPSULE    Take 2 mg by mouth as needed for diarrhea or loose stools.   MULTIPLE VITAMINS-MINERALS (DECUBI-VITE PO)    Take 1 capsule by mouth 2 (two) times daily.   PANTOPRAZOLE (PROTONIX) 40 MG TABLET    Take 40 mg by mouth daily.   SERTRALINE (ZOLOFT) 25 MG TABLET    Take 75 mg by mouth daily.  Modified Medications   No medications on file  Discontinued Medications   No medications on file    Physical Exam: Filed Vitals:   07/01/14 1235  BP: 132/78  Pulse: 78  Temp: 97.8 F (36.6 C)  Resp: 18  Height: 5' 2"  (1.575 m)  Weight: 132 lb (59.875 kg)  SpO2: 95%  Physical Exam  Neck:  Tenderness in paravertebral muscles of neck  Cardiovascular: Normal rate, regular rhythm, normal heart sounds and intact distal pulses.   Pulmonary/Chest: Effort normal and breath sounds normal.  Abdominal: Soft. Bowel sounds are normal. She exhibits no distension and no mass. There is no tenderness.  Musculoskeletal: She exhibits edema and tenderness.  Mild edema and tenderness of left wrist, wearing brace  Neurological: She is alert.    Labs reviewed: Basic Metabolic Panel:  Recent Labs  08/05/13 1327 04/19/14 06/18/14 0640 06/28/14 2035  NA 139 142 143 144  K 4.0 3.7 3.6 3.7  CL 104  --  104 104  CO2 26  --  26 26  GLUCOSE 95  --  81 89  BUN 16 27* 21 26*  CREATININE 0.89 1.1 1.20* 0.79  CALCIUM 9.2  --  8.6 8.8    Liver Function Tests:  Recent Labs  04/19/14  AST 28  ALT 12  ALKPHOS 111     CBC:  Recent Labs  07/18/13 1402 08/05/13 1327 04/19/14 06/28/14 2035  WBC 6.4 7.1 7.8 9.7  NEUTROABS 2.7 3.4  --  5.6  HGB 12.8 14.5 7.5* 10.6*  HCT 38.3 41.1 23* 32.9*  MCV 93.6 93.8  --  91.4  PLT 152 207 252 226  06/18/14:  Na 143, K 3.6, BUN 21, cr 1.2 06/25/14:  ESR 71  Significant Diagnostic Results: all ED xrays, CTs and notes reviewed  Assessment/Plan 1. Fracture of ulnar styloid, left, closed, with nonunion, subsequent encounter -cont brace from ED and current pain regimen -voltaren gel was added last time and can be used here as well  2. Fall from wheelchair, subsequent encounter -has small hematoma of scalp--monitored with neurochecks -only other finding on imaging of new significance was what was said to be a chronic ulnar styloid fx at left wrist  3. Chronic pain syndrome -cont voltaren gel, fentanyl patch 76mg, hydrocodone prn for severe pain  4. DDD (degenerative disc disease), cervical -osteophytes seen on neck imaging after fall--likely has myelopathy and some of her neuropathic pain originates from this (MRI 2012 said no myelopathy, but may have progressed over 4 years), cont gabapentin  5. Peripheral neuropathy -cont gabapentin   Family/ staff Communication: seen with unit supervisor  Goals of care: long term care, full code  Labs/tests ordered:  No new orders, last labs all stable at ED

## 2014-07-07 ENCOUNTER — Other Ambulatory Visit: Payer: Self-pay | Admitting: *Deleted

## 2014-07-07 MED ORDER — HYDROCODONE-ACETAMINOPHEN 10-325 MG PO TABS: ORAL_TABLET | ORAL | Status: DC

## 2014-07-07 NOTE — Telephone Encounter (Signed)
[facility] 

## 2014-07-15 ENCOUNTER — Non-Acute Institutional Stay (SKILLED_NURSING_FACILITY): Payer: Medicare Other | Admitting: Internal Medicine

## 2014-07-15 DIAGNOSIS — K088 Other specified disorders of teeth and supporting structures: Secondary | ICD-10-CM

## 2014-07-15 DIAGNOSIS — I69391 Dysphagia following cerebral infarction: Secondary | ICD-10-CM

## 2014-07-15 DIAGNOSIS — R634 Abnormal weight loss: Secondary | ICD-10-CM

## 2014-07-15 DIAGNOSIS — K089 Disorder of teeth and supporting structures, unspecified: Secondary | ICD-10-CM

## 2014-07-15 NOTE — Progress Notes (Signed)
Patient ID: [name], female   DOB: 1925/06/20, 80 y.o.   MRN: [id number]  Location:  [facility] Provider:  Jonelle Sidle L. Mariea Clonts, D.O., C.M.D.  Code Status:  Full code  Chief Complaint  Patient presents with  . Acute Visit    weight loss    HPI:  80 yo black female long term care residnet seen for an acute visit due to weight loss.  She has lost 2 lbs in 30 days, 8 in 90 days, and about 8 in 180 days.  She has poor dentition, is working with speech therapy and the consistency of her food was changed recently to help with her swallowing.   Review of Systems:  Review of Systems  Constitutional: Positive for weight loss and malaise/fatigue.  HENT:       Poor dentition  Gastrointestinal: Positive for nausea, diarrhea and constipation. Negative for vomiting.       Swallowing problems  Neurological: Positive for weakness.    Medications: Patient's Medications  New Prescriptions   DOXYCYCLINE (VIBRA-TABS) 100 MG TABLET    Take 1 tablet (100 mg total) by mouth 2 (two) times daily.   GUAIFENESIN (MUCINEX) 600 MG 12 HR TABLET    Take 1 tablet (600 mg total) by mouth 2 (two) times daily.   LEVOFLOXACIN (LEVAQUIN) 750 MG TABLET    Take 1 tablet (750 mg total) by mouth daily.   SACCHAROMYCES BOULARDII (FLORASTOR) 250 MG CAPSULE    Take 1 capsule (250 mg total) by mouth 2 (two) times daily.  Previous Medications   CALCIUM PO    Take 60 mg by mouth 2 (two) times daily.   CARBOXYMETHYLCELLULOSE (REFRESH PLUS) 0.5 % SOLN    Place 1 drop into the right eye 3 (three) times daily.    DICLOFENAC SODIUM (VOLTAREN) 1 % GEL    Apply 4 g topically 4 (four) times daily. Bilateral knees   FERROUS SULFATE 325 (65 FE) MG TABLET    Take 325 mg by mouth 3 (three) times daily with meals.    FLUTICASONE (FLONASE) 50 MCG/ACT NASAL SPRAY    Place 1 spray into both nostrils daily as needed for allergies or rhinitis (congestion).   GABAPENTIN (NEURONTIN) 600 MG TABLET    Take 600 mg by mouth 3  (three) times daily.   LINACLOTIDE (LINZESS) 145 MCG CAPS CAPSULE    Take 145 mcg by mouth daily.   LOPERAMIDE (IMODIUM) 2 MG CAPSULE    Take 2 mg by mouth as needed for diarrhea or loose stools.   MULTIPLE VITAMINS-MINERALS (DECUBI-VITE PO)    Take 1 capsule by mouth daily.    PANTOPRAZOLE (PROTONIX) 40 MG TABLET    Take 40 mg by mouth daily.   SERTRALINE (ZOLOFT) 25 MG TABLET    Take 75 mg by mouth daily.  Modified Medications   Modified Medication Previous Medication   FENTANYL (DURAGESIC - DOSED MCG/HR) 25 MCG/HR PATCH fentaNYL (DURAGESIC - DOSED MCG/HR) 25 MCG/HR patch      Place 1 patch (25 mcg total) onto the skin every 3 (three) days.    Place 1 patch (25 mcg total) onto the skin every 3 (three) days.  Discontinued Medications   FENTANYL (DURAGESIC - DOSED MCG/HR) 25 MCG/HR PATCH    Place 1 patch (25 mcg total) onto the skin every 3 (three) days.   HYDROCODONE-ACETAMINOPHEN (NORCO) 10-325 MG PER TABLET    Take one tablet by mouth every 4 hours for pain   HYDROCODONE-ACETAMINOPHEN (NORCO/VICODIN) 5-325  MG PER TABLET    Take 1 tablet by mouth every 6 (six) hours as needed (Breakthrough pain.).    Physical Exam: Filed Vitals:   07/15/14 1905  BP: 132/78  Pulse: 78  Temp: 97.8 F (36.6 C)  Resp: 18  Height: 5\' 2"  (1.575 m)  Weight: 131 lb (59.421 kg)  SpO2: 95%   Physical Exam  Constitutional: She appears well-developed and well-nourished. No distress.  Cardiovascular: Normal rate, regular rhythm, normal heart sounds and intact distal pulses.   Pulmonary/Chest: Effort normal and breath sounds normal.  Abdominal: Soft. Bowel sounds are normal. She exhibits no distension and no mass. There is no tenderness.  Neurological: She is alert.  Skin: Skin is warm and dry.    Labs reviewed: Basic Metabolic Panel:  Recent Labs  10/28/14 0500 10/29/14 0402 11/03/14 0532  NA 137 137 144  K 3.9 4.1 4.4  CL 104 105 111  CO2 27 27 25   GLUCOSE 95 92 87  BUN 20 25* 31*  CREATININE  0.81 0.80 0.86  CALCIUM 7.7* 7.7* 7.7*    Liver Function Tests:  Recent Labs  04/19/14 10/19/14 1843  AST 28 73*  ALT 12 20  ALKPHOS 111 139*  BILITOT  --  0.7  PROT  --  8.6*  ALBUMIN  --  3.2*    CBC:  Recent Labs  06/28/14 2035 10/19/14 1959  11/04/14 0645 11/05/14 0522 11/10/14 0545  WBC 9.7 10.8*  < > 13.9* 14.8* 10.7*  NEUTROABS 5.6 7.8*  --   --   --   --   HGB 10.6* 9.7*  < > 8.8* 8.8* 9.2*  HCT 32.9* 32.5*  < > 27.1* 27.7* 29.6*  MCV 91.4 96.2  < > 87.1 88.8 89.7  PLT 226 214  < > 457* 477* 386  < > = values in this interval not displayed.   Assessment/Plan 1. Loss of weight -due to 2 and 3 and also has been hospitalized a few times and had a stroke during one hospitalization in the past year -cont supplements and newly modified diet and cont to monitor 2. Poor dentition -f/u with dentist 3. Dysphagia due to old stroke -cont to work with [employer] [employer] and work with modified diet  Family/ staff Communication: seen with unit supervisor  Goals of care: long term care, full code

## 2014-07-29 ENCOUNTER — Other Ambulatory Visit: Payer: Self-pay | Admitting: *Deleted

## 2014-07-29 MED ORDER — HYDROCODONE-ACETAMINOPHEN 10-325 MG PO TABS: ORAL_TABLET | ORAL | Status: DC

## 2014-07-29 NOTE — Telephone Encounter (Signed)
[facility] 

## 2014-08-05 ENCOUNTER — Encounter: Payer: Self-pay | Admitting: Internal Medicine

## 2014-08-05 ENCOUNTER — Non-Acute Institutional Stay (SKILLED_NURSING_FACILITY): Payer: Medicare Other | Admitting: Internal Medicine

## 2014-08-05 DIAGNOSIS — J309 Allergic rhinitis, unspecified: Secondary | ICD-10-CM

## 2014-08-05 DIAGNOSIS — L299 Pruritus, unspecified: Secondary | ICD-10-CM

## 2014-08-05 NOTE — Progress Notes (Signed)
Patient ID: [name], female   DOB: 25-Jun-1925, 80 y.o.   MRN: [id number]   Place of Service: [facility]  Allergies  Allergen Reactions  . Codeine     Hallucinations  . Ibuprofen     Confusion    Code Status: Full Code Goals of Care: Longevity/Long term care   Chief Complaint  Patient presents with  . Acute Visit    itching    HPI  80 y.o. female with PMH of chronic back pain, GERD, depression, OA among others is being seen for an acute visit at the request of nursing staff for generalized itching. Resident also reports having congestion. Otherwise no other concerns reported. No change in medications reported. No other concerns from staff.   Review of Systems Constitutional: Negative for fever, chills, and fatigue. HENT: Negative for eye discharge, eye pain. Positive for congestion Eyes: Negative for eye pain, eye discharge, and visual disturbance  Cardiovascular: Negative for chest pain Respiratory: Negative cough, shortness of breath Skin: Negative for rash and wound.    Past Medical History  Diagnosis Date  . Hypertension   . Head ache   . Renal insufficiency, mild   . Depression   . Fecal occult blood test positive   . Restless leg syndrome   . Anemia   . Cataracts, bilateral   . Hemorrhoids   . Rupture of rotator cuff of shoulder     s/p repair by Dr. Berenice Primas 7/07. Follows with Dr. Tamera Punt for possible shoulder surgery.   . Lumbar spinal stenosis     gets Spinal injections by Dr. Marlaine Hind   . Postnasal drip   . Atrial tachycardia     echo 04/08/11: EF 55-60%, mild LVH, grade 1 diast dysfxn;    s/p RFCA with Dr. Lovena Le  04/09/11  . Hyperkalemia 04/08/2011  . Unspecified deficiency anemia   . Osteoporosis, unspecified 04/09/2013    DEXA 5/07 : T L forearm -2.9. L femur -0.6, R femur -1.4. [name]  . CVA (cerebral infarction) 04/09/2013    MRI 2005 : Multiple tiny old lacunar infarcts as well as scattered white matter  small vessel ischemic disease. Pt asymptomatic and denies TIA / CVA hx.    . Chronic pain syndrome 04/09/2013    On chronic opioids. Cervical MRI 11/ 2012 : Diffuse cervical spondylosis, Diffuse degenerative disc disease with multilevel foraminal stenosis. Possible T1 nerve root involvement. Lumbar MRI 2009 : prior fusion at L3-4 and degenerative disc disease Rotator cuff repair : Dr. Berenice Primas 7/07. Follows with Dr. Tamera Punt for possible shoulder surgery.  H/O lumbar spinal stenosis with steroid injections. Mo  . Arthritis     b/l shoulder R>L  . Rotator cuff tear arthropathy     03/22/13 dx [facility] (Dr. Tamera Punt) considering surgery  . Stroke   . History of blood transfusion 2014    anemia-     Past Surgical History  Procedure Laterality Date  . Doppler echocardiography  2004  . Rotator cuff repair      right  . Back surgery      lumbar x 2, 2000 and 2004 by Dr. Deri Fuelling.   . Eye surgery Bilateral     cataract  . Abdominal hysterectomy    . Orif finger fracture Left     ring finger  . Lumbar laminectomy/decompression microdiscectomy Left 08/06/2013    Procedure: Left Thoracic eleven-twelve microdiskectomy ;  Surgeon: Charlie Pitter, MD;  Location: [facility];  Service: Neurosurgery;  Laterality: Left;  Left Thoracic eleven-twelve microdiskectomy     History   Social History  . Marital Status: Single    Spouse Name: N/A    Number of Children: 1  . Years of Education: N/A   Occupational History  .     Social History Main Topics  . Smoking status: Never Smoker   . Smokeless tobacco: Not on file  . Alcohol Use: No  . Drug Use: No  . Sexual Activity: Not on file   Other Topics Concern  . Not on file   Social History Narrative   Originally from [place]. She has been here in the [place] since the 60's.   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           Medication List       This list is accurate as of: 08/05/14  2:46 PM.  Always use your most recent med list.               carboxymethylcellulose 0.5 % Soln  Commonly known as:  REFRESH PLUS  Place 1 drop into the right eye 3 (three) times daily.     DECUBI-VITE PO  Take 1 capsule by mouth 2 (two) times daily.     diclofenac sodium 1 % Gel  Commonly known as:  VOLTAREN  Apply 4 g topically 4 (four) times daily. Bilateral knees     fentaNYL 25 MCG/HR patch  Commonly known as:  DURAGESIC - dosed mcg/hr  Place 1 patch (25 mcg total) onto the skin every 3 (three) days.     ferrous sulfate 325 (65 FE) MG tablet  Take 325 mg by mouth daily with breakfast.     gabapentin 600 MG tablet  Commonly known as:  NEURONTIN  Take 600 mg by mouth 3 (three) times daily.     HYDROcodone-acetaminophen 5-325 MG per tablet  Commonly known as:  NORCO/VICODIN  Take 1 tablet by mouth every 6 (six) hours as needed (Breakthrough pain.).     HYDROcodone-acetaminophen 10-325 MG per tablet  Commonly known as:  NORCO  Take one tablet by mouth every 4 hours for pain     LINZESS 145 MCG Caps capsule  Generic drug:  Linaclotide  Take 145 mcg by mouth daily.     loperamide 2 MG capsule  Commonly known as:  IMODIUM  Take 2 mg by mouth as needed for diarrhea or loose stools.     pantoprazole 40 MG tablet  Commonly known as:  PROTONIX  Take 40 mg by mouth daily.     sertraline 25 MG tablet  Commonly known as:  ZOLOFT  Take 75 mg by mouth daily.        Physical Exam Filed Vitals:   08/05/14 1438  BP: 134/78  Pulse: 64  Temp: 97.2 F (36.2 C)  Resp: 18   Constitutional: WDWN AA elderly female in no acute distress.  HEENT: Normocephalic and atraumatic. PERRL. EOM intact. No icterus. No nasal discharge or sinus tenderness. Oral mucosa moist. Posterior pharynx clear of any exudate or lesions.  Neck: Supple and nontender. No lymphadenopathy, masses, or thyromegaly. No JVD or carotid  bruits. Cardiac: Normal S1, S2. Soft 1/6 systolic mumur. Lungs: No respiratory distress. Breath sounds clear bilaterally without rales, rhonchi, or wheezes. Skin: Warm and dry. No  rash noted. No erythema.  Neurological: Alert and oriented to person, place, and time.  Psychiatric: Judgment and insight adequate. Appropriate mood and affect.   Assessment & Plan  1. Itching Generalized. Will start zyrtec 10mg  PO daily x 1 week then daily PRN. Continue to monitor.   2. Allergic rhinitis, unspecified allergic rhinitis type -Start zyrtec 10mg  po daily x 1 week then daily PRN. Flonase 8mcg 1 spray in each nostril daily as needed for congestion. Continue to monitor.     Family/Staff Communication Plan of care discuss with resident and professional staff members. Resident and professional staff members verbalize understanding and agree with plan of care. No additional questions or concerns reported.    Arthur Holms, MSN, AGNP-C [facility] [address] [place], [place] [address] [phone] [8am-5pm] After hours: [phone]

## 2014-08-19 ENCOUNTER — Other Ambulatory Visit: Payer: Self-pay | Admitting: *Deleted

## 2014-08-19 MED ORDER — HYDROCODONE-ACETAMINOPHEN 10-325 MG PO TABS: ORAL_TABLET | ORAL | Status: DC

## 2014-08-19 NOTE — Telephone Encounter (Signed)
[facility] 

## 2014-09-09 ENCOUNTER — Other Ambulatory Visit: Payer: Self-pay | Admitting: *Deleted

## 2014-09-09 MED ORDER — HYDROCODONE-ACETAMINOPHEN 10-325 MG PO TABS: ORAL_TABLET | ORAL | Status: DC

## 2014-09-09 NOTE — Telephone Encounter (Signed)
[facility] 

## 2014-09-18 ENCOUNTER — Non-Acute Institutional Stay (SKILLED_NURSING_FACILITY): Payer: Medicare Other | Admitting: Adult Health

## 2014-09-18 ENCOUNTER — Encounter: Payer: Self-pay | Admitting: Adult Health

## 2014-09-18 DIAGNOSIS — K5900 Constipation, unspecified: Secondary | ICD-10-CM

## 2014-09-18 DIAGNOSIS — G894 Chronic pain syndrome: Secondary | ICD-10-CM

## 2014-09-18 DIAGNOSIS — I639 Cerebral infarction, unspecified: Secondary | ICD-10-CM

## 2014-09-18 DIAGNOSIS — D649 Anemia, unspecified: Secondary | ICD-10-CM

## 2014-09-18 DIAGNOSIS — K219 Gastro-esophageal reflux disease without esophagitis: Secondary | ICD-10-CM

## 2014-09-18 DIAGNOSIS — I10 Essential (primary) hypertension: Secondary | ICD-10-CM

## 2014-09-18 NOTE — Progress Notes (Signed)
Patient ID: [name], female   DOB: January 15, 1925, 80 y.o.   MRN: [id number]  [name] living     Allergies  Allergen Reactions  . Codeine     Hallucinations  . Ibuprofen     Confusion       Chief Complaint  Patient presents with  . Medical Management of Chronic Issues    HPI:  She is a long term resident of this facility being seen for the management of her chronic illnesses. Overall there is little change in her recent status. She is various complaints regarding body aches and pains present. The nursing staff reports that she has not complained to them regarding pain for an extended period of time.    Past Medical History  Diagnosis Date  . Hypertension   . Head ache   . Renal insufficiency, mild   . Depression   . Fecal occult blood test positive   . Restless leg syndrome   . Anemia   . Cataracts, bilateral   . Hemorrhoids   . Rupture of rotator cuff of shoulder     s/p repair by Dr. Berenice Primas 7/07. Follows with Dr. Tamera Punt for possible shoulder surgery.   . Lumbar spinal stenosis     gets Spinal injections by Dr. Marlaine Hind   . Postnasal drip   . Atrial tachycardia     echo 04/08/11: EF 55-60%, mild LVH, grade 1 diast dysfxn;    s/p RFCA with Dr. Lovena Le  04/09/11  . Hyperkalemia 04/08/2011  . Unspecified deficiency anemia   . Osteoporosis, unspecified 04/09/2013    DEXA 5/07 : T L forearm -2.9. L femur -0.6, R femur -1.4. [name]  . CVA (cerebral infarction) 04/09/2013    MRI 2005 : Multiple tiny old lacunar infarcts as well as scattered white matter small vessel ischemic disease. Pt asymptomatic and denies TIA / CVA hx.    . Chronic pain syndrome 04/09/2013    On chronic opioids. Cervical MRI 11/ 2012 : Diffuse cervical spondylosis, Diffuse degenerative disc disease with multilevel foraminal stenosis. Possible T1 nerve root involvement. Lumbar MRI 2009 : prior fusion at L3-4 and degenerative disc disease Rotator cuff repair : Dr. Berenice Primas 7/07. Follows with  Dr. Tamera Punt for possible shoulder surgery.  H/O lumbar spinal stenosis with steroid injections. Mo  . Arthritis     b/l shoulder R>L  . Rotator cuff tear arthropathy     03/22/13 dx [facility] (Dr. Tamera Punt) considering surgery  . Stroke   . History of blood transfusion 2014    anemia-     Past Surgical History  Procedure Laterality Date  . Doppler echocardiography  2004  . Rotator cuff repair      right  . Back surgery      lumbar x 2, 2000 and 2004 by Dr. Deri Fuelling.   . Eye surgery Bilateral     cataract  . Abdominal hysterectomy    . Orif finger fracture Left     ring finger  . Lumbar laminectomy/decompression microdiscectomy Left 08/06/2013    Procedure: Left Thoracic eleven-twelve microdiskectomy ;  Surgeon: Charlie Pitter, MD;  Location: [facility];  Service: Neurosurgery;  Laterality: Left;  Left Thoracic eleven-twelve microdiskectomy     VITAL SIGNS BP 137/64 mmHg  Pulse 74  Ht 5\' 2"  (1.575 m)  Wt 136 lb (61.689 kg)  BMI 24.87 kg/m2  SpO2 95%   Outpatient Encounter Prescriptions as of 09/18/2014  Medication Sig  .  carboxymethylcellulose (REFRESH PLUS) 0.5 % SOLN Place 1 drop into the right eye 3 (three) times daily.   . diclofenac sodium (VOLTAREN) 1 % GEL Apply 4 g topically 4 (four) times daily. Bilateral knees  . fentaNYL (DURAGESIC - DOSED MCG/HR) 25 MCG/HR patch Place 1 patch (25 mcg total) onto the skin every 3 (three) days.  . ferrous sulfate 325 (65 FE) MG tablet Take 325 mg by mouth daily with breakfast.   . gabapentin (NEURONTIN) 600 MG tablet Take 600 mg by mouth 3 (three) times daily.  Marland Kitchen HYDROcodone-acetaminophen (NORCO) 10-325 MG per tablet Take one tablet by mouth every 4 hours for pain. Not to exceed 3gm apap from all sources/24hr  . HYDROcodone-acetaminophen (NORCO/VICODIN) 5-325 MG per tablet Take 1 tablet by mouth every 6 (six) hours as needed (Breakthrough pain.).  Marland Kitchen Linaclotide (LINZESS) 145 MCG CAPS capsule Take 145 mcg by mouth  daily.  Marland Kitchen loperamide (IMODIUM) 2 MG capsule Take 2 mg by mouth as needed for diarrhea or loose stools.  . Multiple Vitamins-Minerals (DECUBI-VITE PO) Take 1 capsule by mouth 2 (two) times daily.  . pantoprazole (PROTONIX) 40 MG tablet Take 40 mg by mouth daily.  . sertraline (ZOLOFT) 25 MG tablet Take 75 mg by mouth daily.     SIGNIFICANT DIAGNOSTIC EXAMS  LABS REVIEWED:   04-19-14: wbc 7.8; hgb 7.5; hct 23.2; mcv 91.3; plt 252; glucose 98; bun 27; creat 1.11; k+3.7; na++142; liver normal albumin 3.0; EPO 51.7  05-26-14: tsh 0.662; vit b12: 568; folate >20 06-18-14: glucose 81; bun 21; creat 1.2; k+3.6; na++143  06-25-14: sed rate 71    Review of Systems  Constitutional: Negative for malaise/fatigue.  Respiratory: Negative for cough and shortness of breath.   Cardiovascular: Negative for chest pain, palpitations and leg swelling.  Gastrointestinal: Negative for heartburn, abdominal pain and constipation.  Musculoskeletal: Positive for myalgias, back pain and joint pain.  Skin: Negative.   Psychiatric/Behavioral: Negative for depression. The patient is not nervous/anxious.      Physical Exam  Constitutional: She appears well-developed and well-nourished. No distress.  Neck: Neck supple. No JVD present. No thyromegaly present.  Cardiovascular: Normal rate, regular rhythm and intact distal pulses.   Respiratory: Effort normal and breath sounds normal. No respiratory distress.  GI: Soft. Bowel sounds are normal. She exhibits no distension. There is no tenderness.  Musculoskeletal: She exhibits edema.  Is able to move all extremities Has 2+ ankle edema present   Skin: She is not diaphoretic.       ASSESSMENT/ PLAN:  1. Chronic pain syndrome: will continue duragesic 25 mcg patch every 3 days; takes neurontin 600 mg three times daily; will continue voltaren gel 4 gm to knees four times daily; and vicodin 5/325 mg every 4 hours and eery 6 hours as needed will not make changes will  monitor  2. Anemia: will continue iron three times daily and will check cbc  3.  Constipation: will continue linzess 145 mcg daily   4. Gerd: will continue proonix 40 mg daily   5. Depression: she is stable and does receive benefit from zoloft 75 mg daily and will monitor   6. Peripheral neuropathy: is stable will continue neurontin 600 mg three times daily   7. CVA: is neurologically stable is presently not on medications; will not make changes will monitor     Ok Edwards NP [facility]  Contact [phone] Monday through Friday 8am- 5pm  After hours call [phone]

## 2014-09-23 ENCOUNTER — Other Ambulatory Visit: Payer: Self-pay | Admitting: *Deleted

## 2014-09-23 MED ORDER — FENTANYL 25 MCG/HR TD PT72: 25.0000 ug | MEDICATED_PATCH | TRANSDERMAL | Status: DC

## 2014-09-23 NOTE — Telephone Encounter (Signed)
[facility] 

## 2014-10-13 ENCOUNTER — Other Ambulatory Visit: Payer: Self-pay | Admitting: *Deleted

## 2014-10-13 MED ORDER — HYDROCODONE-ACETAMINOPHEN 10-325 MG PO TABS: ORAL_TABLET | ORAL | Status: DC

## 2014-10-13 NOTE — Telephone Encounter (Signed)
[facility] 

## 2014-10-14 ENCOUNTER — Other Ambulatory Visit: Payer: Self-pay | Admitting: *Deleted

## 2014-10-14 MED ORDER — FENTANYL 25 MCG/HR TD PT72: 25.0000 ug | MEDICATED_PATCH | TRANSDERMAL | Status: DC

## 2014-10-14 NOTE — Telephone Encounter (Signed)
[facility] 

## 2014-10-19 ENCOUNTER — Inpatient Hospital Stay (HOSPITAL_COMMUNITY)
Admission: EM | Admit: 2014-10-19 | Discharge: 2014-11-05 | DRG: 177 | Disposition: A | Discharge status: Skilled Nursing Facility | Payer: Medicare Other | Attending: Internal Medicine | Admitting: Internal Medicine

## 2014-10-19 ENCOUNTER — Emergency Department (HOSPITAL_COMMUNITY): Payer: Medicare Other

## 2014-10-19 ENCOUNTER — Encounter (HOSPITAL_COMMUNITY): Payer: Self-pay

## 2014-10-19 DIAGNOSIS — G894 Chronic pain syndrome: Secondary | ICD-10-CM | POA: Diagnosis not present

## 2014-10-19 DIAGNOSIS — R0602 Shortness of breath: Secondary | ICD-10-CM

## 2014-10-19 DIAGNOSIS — D649 Anemia, unspecified: Secondary | ICD-10-CM | POA: Insufficient documentation

## 2014-10-19 DIAGNOSIS — Y95 Nosocomial condition: Secondary | ICD-10-CM | POA: Diagnosis present

## 2014-10-19 DIAGNOSIS — G9340 Encephalopathy, unspecified: Secondary | ICD-10-CM | POA: Diagnosis present

## 2014-10-19 DIAGNOSIS — N189 Chronic kidney disease, unspecified: Secondary | ICD-10-CM | POA: Diagnosis present

## 2014-10-19 DIAGNOSIS — Z90710 Acquired absence of both cervix and uterus: Secondary | ICD-10-CM

## 2014-10-19 DIAGNOSIS — D500 Iron deficiency anemia secondary to blood loss (chronic): Secondary | ICD-10-CM | POA: Diagnosis present

## 2014-10-19 DIAGNOSIS — I5032 Chronic diastolic (congestive) heart failure: Secondary | ICD-10-CM | POA: Diagnosis present

## 2014-10-19 DIAGNOSIS — R197 Diarrhea, unspecified: Secondary | ICD-10-CM | POA: Diagnosis present

## 2014-10-19 DIAGNOSIS — G2581 Restless legs syndrome: Secondary | ICD-10-CM | POA: Diagnosis present

## 2014-10-19 DIAGNOSIS — M13812 Other specified arthritis, left shoulder: Secondary | ICD-10-CM | POA: Diagnosis present

## 2014-10-19 DIAGNOSIS — B957 Other staphylococcus as the cause of diseases classified elsewhere: Secondary | ICD-10-CM | POA: Diagnosis present

## 2014-10-19 DIAGNOSIS — M810 Age-related osteoporosis without current pathological fracture: Secondary | ICD-10-CM | POA: Diagnosis present

## 2014-10-19 DIAGNOSIS — Z8673 Personal history of transient ischemic attack (TIA), and cerebral infarction without residual deficits: Secondary | ICD-10-CM

## 2014-10-19 DIAGNOSIS — M13811 Other specified arthritis, right shoulder: Secondary | ICD-10-CM | POA: Diagnosis present

## 2014-10-19 DIAGNOSIS — F329 Major depressive disorder, single episode, unspecified: Secondary | ICD-10-CM | POA: Diagnosis present

## 2014-10-19 DIAGNOSIS — M542 Cervicalgia: Secondary | ICD-10-CM | POA: Diagnosis not present

## 2014-10-19 DIAGNOSIS — J189 Pneumonia, unspecified organism: Secondary | ICD-10-CM | POA: Diagnosis not present

## 2014-10-19 DIAGNOSIS — I129 Hypertensive chronic kidney disease with stage 1 through stage 4 chronic kidney disease, or unspecified chronic kidney disease: Secondary | ICD-10-CM | POA: Diagnosis present

## 2014-10-19 DIAGNOSIS — I10 Essential (primary) hypertension: Secondary | ICD-10-CM

## 2014-10-19 DIAGNOSIS — Z885 Allergy status to narcotic agent status: Secondary | ICD-10-CM

## 2014-10-19 DIAGNOSIS — R509 Fever, unspecified: Secondary | ICD-10-CM | POA: Insufficient documentation

## 2014-10-19 DIAGNOSIS — M79604 Pain in right leg: Secondary | ICD-10-CM | POA: Diagnosis not present

## 2014-10-19 DIAGNOSIS — I519 Heart disease, unspecified: Secondary | ICD-10-CM

## 2014-10-19 DIAGNOSIS — J1529 Pneumonia due to other staphylococcus: Principal | ICD-10-CM | POA: Diagnosis present

## 2014-10-19 DIAGNOSIS — R0989 Other specified symptoms and signs involving the circulatory and respiratory systems: Secondary | ICD-10-CM

## 2014-10-19 DIAGNOSIS — I272 Other secondary pulmonary hypertension: Secondary | ICD-10-CM | POA: Diagnosis present

## 2014-10-19 DIAGNOSIS — Z79899 Other long term (current) drug therapy: Secondary | ICD-10-CM

## 2014-10-19 DIAGNOSIS — R51 Headache: Secondary | ICD-10-CM | POA: Diagnosis present

## 2014-10-19 DIAGNOSIS — R7881 Bacteremia: Secondary | ICD-10-CM | POA: Diagnosis present

## 2014-10-19 DIAGNOSIS — M79605 Pain in left leg: Secondary | ICD-10-CM | POA: Diagnosis not present

## 2014-10-19 DIAGNOSIS — I5189 Other ill-defined heart diseases: Secondary | ICD-10-CM | POA: Diagnosis present

## 2014-10-19 DIAGNOSIS — Z888 Allergy status to other drugs, medicaments and biological substances status: Secondary | ICD-10-CM

## 2014-10-19 LAB — CBC
HEMATOCRIT: 32.5 % — AB (ref 36.0–46.0)
Hemoglobin: 9.7 g/dL — ABNORMAL LOW (ref 12.0–15.0)
MCH: 28.7 pg (ref 26.0–34.0)
MCHC: 29.8 g/dL — ABNORMAL LOW (ref 30.0–36.0)
MCV: 96.2 fL (ref 78.0–100.0)
Platelets: 214 10*3/uL (ref 150–400)
RBC: 3.38 MIL/uL — AB (ref 3.87–5.11)
RDW: 16.3 % — AB (ref 11.5–15.5)
WBC: 10.8 10*3/uL — AB (ref 4.0–10.5)

## 2014-10-19 LAB — TROPONIN I
TROPONIN I: 0.06 ng/mL — AB (ref <0.031)
TROPONIN I: 0.06 ng/mL — AB (ref <0.031)

## 2014-10-19 LAB — BLOOD GAS, VENOUS
Acid-Base Excess: 2.5 mmol/L — ABNORMAL HIGH (ref 0.0–2.0)
Bicarbonate: 27.5 mEq/L — ABNORMAL HIGH (ref 20.0–24.0)
FIO2: 0.21 %
O2 Saturation: 42.7 %
PATIENT TEMPERATURE: 101.1
PH VEN: 7.369 — AB (ref 7.250–7.300)
TCO2: 25.4 mmol/L (ref 0–100)
pCO2, Ven: 49.7 mmHg (ref 45.0–50.0)

## 2014-10-19 LAB — DIFFERENTIAL
BASOS ABS: 0 10*3/uL (ref 0.0–0.1)
Basophils Relative: 0 % (ref 0–1)
Eosinophils Absolute: 0 10*3/uL (ref 0.0–0.7)
Eosinophils Relative: 0 % (ref 0–5)
LYMPHS ABS: 2.4 10*3/uL (ref 0.7–4.0)
LYMPHS PCT: 22 % (ref 12–46)
Monocytes Absolute: 0.5 10*3/uL (ref 0.1–1.0)
Monocytes Relative: 5 % (ref 3–12)
Neutro Abs: 7.8 10*3/uL — ABNORMAL HIGH (ref 1.7–7.7)
Neutrophils Relative %: 73 % (ref 43–77)

## 2014-10-19 LAB — COMPREHENSIVE METABOLIC PANEL
ALT: 20 U/L (ref 0–35)
ANION GAP: 4 — AB (ref 5–15)
AST: 73 U/L — ABNORMAL HIGH (ref 0–37)
Albumin: 3.2 g/dL — ABNORMAL LOW (ref 3.5–5.2)
Alkaline Phosphatase: 139 U/L — ABNORMAL HIGH (ref 39–117)
BUN: 13 mg/dL (ref 6–23)
CHLORIDE: 108 meq/L (ref 96–112)
CO2: 25 mmol/L (ref 19–32)
Calcium: 7.9 mg/dL — ABNORMAL LOW (ref 8.4–10.5)
Creatinine, Ser: 0.78 mg/dL (ref 0.50–1.10)
GFR calc Af Amer: 85 mL/min — ABNORMAL LOW (ref >90)
GFR calc non Af Amer: 73 mL/min — ABNORMAL LOW (ref >90)
Glucose, Bld: 94 mg/dL (ref 70–99)
POTASSIUM: 4.3 mmol/L (ref 3.5–5.1)
SODIUM: 137 mmol/L (ref 135–145)
TOTAL PROTEIN: 8.6 g/dL — AB (ref 6.0–8.3)
Total Bilirubin: 0.7 mg/dL (ref 0.3–1.2)

## 2014-10-19 LAB — URINE MICROSCOPIC-ADD ON

## 2014-10-19 LAB — URINALYSIS, ROUTINE W REFLEX MICROSCOPIC
Bilirubin Urine: NEGATIVE
GLUCOSE, UA: NEGATIVE mg/dL
KETONES UR: NEGATIVE mg/dL
LEUKOCYTES UA: NEGATIVE
Nitrite: NEGATIVE
PH: 6.5 (ref 5.0–8.0)
PROTEIN: 100 mg/dL — AB
Specific Gravity, Urine: 1.012 (ref 1.005–1.030)
Urobilinogen, UA: 1 mg/dL (ref 0.0–1.0)

## 2014-10-19 LAB — I-STAT CG4 LACTIC ACID, ED: LACTIC ACID, VENOUS: 2.12 mmol/L (ref 0.5–2.2)

## 2014-10-19 MED ORDER — SODIUM CHLORIDE 0.9 % IV SOLN
250.0000 mL | INTRAVENOUS | Status: DC | PRN
  Administered 2014-10-19: 250 mL via INTRAVENOUS

## 2014-10-19 MED ORDER — SODIUM CHLORIDE 0.9 % IV SOLN
Freq: Once | INTRAVENOUS | Status: AC
  Administered 2014-10-19: 21:00:00 via INTRAVENOUS

## 2014-10-19 MED ORDER — DEXTROSE 5 % IV SOLN
2.0000 g | Freq: Once | INTRAVENOUS | Status: AC
  Administered 2014-10-19: 2 g via INTRAVENOUS
  Filled 2014-10-19: qty 2

## 2014-10-19 MED ORDER — LORAZEPAM 2 MG/ML IJ SOLN
1.0000 mg | Freq: Once | INTRAMUSCULAR | Status: DC
Start: 2014-10-19 — End: 2014-10-20
  Filled 2014-10-19: qty 1

## 2014-10-19 MED ORDER — SODIUM CHLORIDE 0.9 % IJ SOLN
3.0000 mL | Freq: Two times a day (BID) | INTRAMUSCULAR | Status: DC
  Administered 2014-10-20 – 2014-11-05 (×14): 3 mL via INTRAVENOUS

## 2014-10-19 MED ORDER — VANCOMYCIN HCL 500 MG IV SOLR
500.0000 mg | Freq: Two times a day (BID) | INTRAVENOUS | Status: DC
  Administered 2014-10-20 – 2014-10-22 (×5): 500 mg via INTRAVENOUS
  Filled 2014-10-19 (×6): qty 500

## 2014-10-19 MED ORDER — DEXTROSE 5 % IV SOLN
500.0000 mg | Freq: Once | INTRAVENOUS | Status: AC
  Administered 2014-10-19: 500 mg via INTRAVENOUS
  Filled 2014-10-19: qty 500

## 2014-10-19 MED ORDER — DEXTROSE 5 % IV SOLN
2.0000 g | Freq: Three times a day (TID) | INTRAVENOUS | Status: DC
  Administered 2014-10-20: 2 g via INTRAVENOUS
  Filled 2014-10-19 (×2): qty 2

## 2014-10-19 MED ORDER — HYDRALAZINE HCL 20 MG/ML IJ SOLN
20.0000 mg | Freq: Once | INTRAMUSCULAR | Status: AC
  Administered 2014-10-19: 20 mg via INTRAVENOUS
  Filled 2014-10-19: qty 1

## 2014-10-19 MED ORDER — SODIUM CHLORIDE 0.9 % IJ SOLN: 3.0000 mL | INTRAMUSCULAR | Status: DC | PRN

## 2014-10-19 MED ORDER — SODIUM CHLORIDE 0.9 % IV BOLUS (SEPSIS): 1000.0000 mL | Freq: Once | INTRAVENOUS | Status: DC

## 2014-10-19 MED ORDER — CLONIDINE HCL 0.2 MG PO TABS
0.2000 mg | ORAL_TABLET | Freq: Three times a day (TID) | ORAL | Status: DC | PRN
  Filled 2014-10-19: qty 1

## 2014-10-19 MED ORDER — CLONIDINE HCL 0.1 MG PO TABS
0.1000 mg | ORAL_TABLET | Freq: Once | ORAL | Status: DC
  Filled 2014-10-19: qty 1

## 2014-10-19 MED ORDER — ACETAMINOPHEN 650 MG RE SUPP
650.0000 mg | Freq: Once | RECTAL | Status: AC
  Administered 2014-10-19: 650 mg via RECTAL
  Filled 2014-10-19: qty 1

## 2014-10-19 MED ORDER — ENOXAPARIN SODIUM 40 MG/0.4ML ~~LOC~~ SOLN
40.0000 mg | Freq: Every day | SUBCUTANEOUS | Status: DC
  Administered 2014-10-20 – 2014-10-24 (×6): 40 mg via SUBCUTANEOUS
  Filled 2014-10-19 (×7): qty 0.4

## 2014-10-19 MED ORDER — VANCOMYCIN HCL IN DEXTROSE 1-5 GM/200ML-% IV SOLN: 1000.0000 mg | Freq: Once | INTRAVENOUS | Status: DC

## 2014-10-19 NOTE — ED Notes (Signed)
Bed: WA09 Expected date: 10/19/14 Expected time: 5:43 PM Means of arrival: Ambulance Comments: AMS, Fever

## 2014-10-19 NOTE — H&P (Signed)
PCP:   No primary care provider on file.   Chief Complaint:  fever  HPI: 81 yo female h/o htn, ckd, cva, chronic pain sent in from her snf for ams and fever.  History unobtainable from pt due to ams.  She appears uncomfortable grimacing/agitated but cannot verbalize if she is in pain.  Follows simple commands.  Unknown baseline.  Review of Systems:  Unobtainable  Past Medical History: Past Medical History  Diagnosis Date  . Hypertension   . Head ache   . Renal insufficiency, mild   . Depression   . Fecal occult blood test positive   . Restless leg syndrome   . Anemia   . Cataracts, bilateral   . Hemorrhoids   . Rupture of rotator cuff of shoulder     s/p repair by Dr. Berenice Primas 7/07. Follows with Dr. Tamera Punt for possible shoulder surgery.   . Lumbar spinal stenosis     gets Spinal injections by Dr. Marlaine Hind   . Postnasal drip   . Atrial tachycardia     echo 04/08/11: EF 55-60%, mild LVH, grade 1 diast dysfxn;    s/p RFCA with Dr. Lovena Le  04/09/11  . Hyperkalemia 04/08/2011  . Unspecified deficiency anemia   . Osteoporosis, unspecified 04/09/2013    DEXA 5/07 : T L forearm -2.9. L femur -0.6, R femur -1.4. [name]  . CVA (cerebral infarction) 04/09/2013    MRI 2005 : Multiple tiny old lacunar infarcts as well as scattered white matter small vessel ischemic disease. Pt asymptomatic and denies TIA / CVA hx.    . Chronic pain syndrome 04/09/2013    On chronic opioids. Cervical MRI 11/ 2012 : Diffuse cervical spondylosis, Diffuse degenerative disc disease with multilevel foraminal stenosis. Possible T1 nerve root involvement. Lumbar MRI 2009 : prior fusion at L3-4 and degenerative disc disease Rotator cuff repair : Dr. Berenice Primas 7/07. Follows with Dr. Tamera Punt for possible shoulder surgery.  H/O lumbar spinal stenosis with steroid injections. Mo  . Arthritis     b/l shoulder R>L  . Rotator cuff tear arthropathy     03/22/13 dx [facility] (Dr. Tamera Punt)  considering surgery  . Stroke   . History of blood transfusion 2014    anemia-    Past Surgical History  Procedure Laterality Date  . Doppler echocardiography  2004  . Rotator cuff repair      right  . Back surgery      lumbar x 2, 2000 and 2004 by Dr. Deri Fuelling.   . Eye surgery Bilateral     cataract  . Abdominal hysterectomy    . Orif finger fracture Left     ring finger  . Lumbar laminectomy/decompression microdiscectomy Left 08/06/2013    Procedure: Left Thoracic eleven-twelve microdiskectomy ;  Surgeon: Charlie Pitter, MD;  Location: [place] [facility];  Service: Neurosurgery;  Laterality: Left;  Left Thoracic eleven-twelve microdiskectomy     Medications: Prior to Admission medications   Medication Sig Start Date End Date Taking? Authorizing Provider  CALCIUM PO Take 60 mg by mouth 2 (two) times daily.   Yes Historical Provider, MD  carboxymethylcellulose (REFRESH PLUS) 0.5 % SOLN Place 1 drop into the right eye 3 (three) times daily.    Yes Historical Provider, MD  diclofenac sodium (VOLTAREN) 1 % GEL Apply 4 g topically 4 (four) times daily. Bilateral knees   Yes Historical Provider, MD  ferrous sulfate 325 (65 FE) MG tablet Take 325 mg by  mouth 3 (three) times daily with meals.    Yes Historical Provider, MD  fluticasone (FLONASE) 50 MCG/ACT nasal spray Place 1 spray into both nostrils daily as needed for allergies or rhinitis (congestion).   Yes Historical Provider, MD  gabapentin (NEURONTIN) 600 MG tablet Take 600 mg by mouth 3 (three) times daily.   Yes Historical Provider, MD  HYDROcodone-acetaminophen (NORCO) 10-325 MG per tablet Take one tablet by mouth every 4 hours for pain. Not to exceed 3gm apap from all sources/24hr 10/13/14  Yes Lauree Chandler, NP  Linaclotide (LINZESS) 145 MCG CAPS capsule Take 145 mcg by mouth daily.   Yes Historical Provider, MD  Multiple Vitamins-Minerals (DECUBI-VITE PO) Take 1 capsule by mouth daily.    Yes Historical Provider, MD  pantoprazole  (PROTONIX) 40 MG tablet Take 40 mg by mouth daily.   Yes Historical Provider, MD  sertraline (ZOLOFT) 25 MG tablet Take 75 mg by mouth daily.   Yes Historical Provider, MD  fentaNYL (DURAGESIC - DOSED MCG/HR) 25 MCG/HR patch Place 1 patch (25 mcg total) onto the skin every 3 (three) days. 10/14/14   Blanchie Serve, MD  HYDROcodone-acetaminophen (NORCO/VICODIN) 5-325 MG per tablet Take 1 tablet by mouth every 6 (six) hours as needed (Breakthrough pain.). 12/03/13   Estill Dooms, MD  loperamide (IMODIUM) 2 MG capsule Take 2 mg by mouth as needed for diarrhea or loose stools.    Historical Provider, MD    Allergies:   Allergies  Allergen Reactions  . Codeine     Hallucinations  . Ibuprofen     Confusion    Social History:  reports that she has never smoked. She does not have any smokeless tobacco history on file. She reports that she does not drink alcohol or use illicit drugs.  Family History: Family History  Problem Relation Age of Onset  . Coronary artery disease Neg Hx     premature    Physical Exam: Filed Vitals:   10/19/14 1752 10/19/14 2130 10/19/14 2145 10/19/14 2200  BP:  147/115  201/93  Pulse:  98 96 91  Temp:      TempSrc:      Resp:  28 26 24   SpO2: 92% 96% 96% 96%   General appearance: alert, cooperative and no distress mild discomfort Head: Normocephalic, without obvious abnormality, atraumatic Eyes: negative Nose: Nares normal. Septum midline. Mucosa normal. No drainage or sinus tenderness. Neck: no JVD and supple, symmetrical, trachea midline Lungs: clear to auscultation bilaterally Heart: regular rate and rhythm, S1, S2 normal, no murmur, click, rub or gallop Abdomen: soft, non-tender; bowel sounds normal; no masses,  no organomegaly Extremities: extremities normal, atraumatic, no cyanosis or edema Pulses: 2+ and symmetric Skin: Skin color, texture, turgor normal. No rashes or lesions Neurologic: Grossly normal  confused   Labs on Admission:   Recent  Labs  10/19/14 1843  NA 137  K 4.3  CL 108  CO2 25  GLUCOSE 94  BUN 13  CREATININE 0.78  CALCIUM 7.9*    Recent Labs  10/19/14 1843  AST 73*  ALT 20  ALKPHOS 139*  BILITOT 0.7  PROT 8.6*  ALBUMIN 3.2*    Recent Labs  10/19/14 1959  WBC 10.8*  NEUTROABS 7.8*  HGB 9.7*  HCT 32.5*  MCV 96.2  PLT 214    Recent Labs  10/19/14 1843 10/19/14 1959  TROPONINI 0.06* 0.06*    Radiological Exams on Admission: Dg Chest Port 1 View  10/19/2014   CLINICAL DATA:  Fever, tachycardia, hypertension  EXAM: PORTABLE CHEST - 1 VIEW  COMPARISON:  06/28/2014  FINDINGS: There is bilateral patchy interstitial and alveolar airspace opacities. There is no pleural effusion or pneumothorax. The heart mediastinum are stable. There is no acute osseous abnormality. There is osteoarthritis of bilateral glenohumeral joints.  IMPRESSION: Bilateral patchy interstitial and alveolar airspace opacities most concerning for multilobar pneumonia including atypical infection.   Electronically Signed   By: Kathreen Devoid   On: 10/19/2014 19:54    Assessment/Plan 81 yo female with fever, HCAP and encephalopathy metabolic  Principal Problem:   HCAP (healthcare-associated pneumonia)-  pna pathway.  Iv vanc/cefepime.  Ck quick flu.  Vss, o2 sats nml.  Blood cx.    Active Problems:  Stable unless o/w noted   Essential hypertension uncontrolled- give hydralazine now and one time dose of clonodine, then po prn clonodine   Diastolic dysfunction compensated at this time   Chronic pain syndrome   Encephalopathy acute due to infection  obs on medical.  Full code.    [name] A 10/19/2014, 10:26 PM

## 2014-10-19 NOTE — ED Notes (Signed)
Lab states troponin is 0.06.

## 2014-10-19 NOTE — ED Notes (Signed)
Assumed care of pt, pt alert, not oriented, pleasant. Continue to await IV. MD aware.

## 2014-10-19 NOTE — Progress Notes (Signed)
ANTIBIOTIC CONSULT NOTE - INITIAL  Pharmacy Consult for Vancomycin Indication: HCAP  Allergies  Allergen Reactions  . Codeine     Hallucinations  . Ibuprofen     Confusion    Patient Measurements:   Adjusted Body Weight:  Vital Signs: Temp: 101.1 F (38.4 C) (01/10 1751) Temp Source: Rectal (01/10 1751) BP: 192/84 mmHg (01/10 1751) Pulse Rate: 94 (01/10 1751) Intake/Output from previous day:   Intake/Output from this shift:    Labs:  Recent Labs  10/19/14 1843 10/19/14 1959  WBC  --  10.8*  HGB  --  9.7*  PLT  --  214  CREATININE 0.78  --    CrCl cannot be calculated (Unknown ideal weight.). No results for input(s): VANCOTROUGH, VANCOPEAK, VANCORANDOM, GENTTROUGH, GENTPEAK, GENTRANDOM, TOBRATROUGH, TOBRAPEAK, TOBRARND, AMIKACINPEAK, AMIKACINTROU, AMIKACIN in the last 72 hours.   Microbiology: No results found for this or any previous visit (from the past 720 hour(s)).  Medical History: Past Medical History  Diagnosis Date  . Hypertension   . Head ache   . Renal insufficiency, mild   . Depression   . Fecal occult blood test positive   . Restless leg syndrome   . Anemia   . Cataracts, bilateral   . Hemorrhoids   . Rupture of rotator cuff of shoulder     s/p repair by Dr. Berenice Primas 7/07. Follows with Dr. Tamera Punt for possible shoulder surgery.   . Lumbar spinal stenosis     gets Spinal injections by Dr. Marlaine Hind   . Postnasal drip   . Atrial tachycardia     echo 04/08/11: EF 55-60%, mild LVH, grade 1 diast dysfxn;    s/p RFCA with Dr. Lovena Le  04/09/11  . Hyperkalemia 04/08/2011  . Unspecified deficiency anemia   . Osteoporosis, unspecified 04/09/2013    DEXA 5/07 : T L forearm -2.9. L femur -0.6, R femur -1.4. [name]  . CVA (cerebral infarction) 04/09/2013    MRI 2005 : Multiple tiny old lacunar infarcts as well as scattered white matter small vessel ischemic disease. Pt asymptomatic and denies TIA / CVA hx.    . Chronic pain syndrome  04/09/2013    On chronic opioids. Cervical MRI 11/ 2012 : Diffuse cervical spondylosis, Diffuse degenerative disc disease with multilevel foraminal stenosis. Possible T1 nerve root involvement. Lumbar MRI 2009 : prior fusion at L3-4 and degenerative disc disease Rotator cuff repair : Dr. Berenice Primas 7/07. Follows with Dr. Tamera Punt for possible shoulder surgery.  H/O lumbar spinal stenosis with steroid injections. Mo  . Arthritis     b/l shoulder R>L  . Rotator cuff tear arthropathy     03/22/13 dx [facility] (Dr. Tamera Punt) considering surgery  . Stroke   . History of blood transfusion 2014    anemia-     Medications:  Anti-infectives    Start     Dose/Rate Route Frequency Ordered Stop   10/20/14 1000  vancomycin (VANCOCIN) 500 mg in sodium chloride 0.9 % 100 mL IVPB     500 mg100 mL/hr over 60 Minutes Intravenous Every 12 hours 10/19/14 2044     10/19/14 2100  vancomycin (VANCOCIN) IVPB 1000 mg/200 mL premix     1,000 mg200 mL/hr over 60 Minutes Intravenous  Once 10/19/14 2044     10/19/14 2030  ceFEPIme (MAXIPIME) 2 g in dextrose 5 % 50 mL IVPB     2 g100 mL/hr over 30 Minutes Intravenous  Once 10/19/14 2027     10/19/14  2030  azithromycin (ZITHROMAX) 500 mg in dextrose 5 % 250 mL IVPB     500 mg250 mL/hr over 60 Minutes Intravenous  Once 10/19/14 2027       Assessment: 81yo F from NH with fever, cough, lethergy, and AMS. Single-doses of Cefepime and Azithromycin are ordered in the ED. Pharmacy is asked to dose Vanc for HCAP. Weight 62kg on 09/18/14.  1/10 >> Azithro x1 1/10 >> Cefepime x1 1/10 >> Vanc >>  Tmax: 101.1 WBCs: very slightly elevated Renal: SCr 0.78, CrCl 57N  Goal of Therapy:  Vancomycin trough level 15-20 mcg/ml  Appropriate antibiotic dosing for renal function; eradication of infection  Plan:  Vancomycin 1g x 1 then 500mg  IV q12h. F/u continuation of other abx.  Measure Vanc trough at steady state. Follow up renal fxn and culture results.  Romeo Rabon, PharmD, pager [phone]. 10/19/2014,8:45 PM.

## 2014-10-19 NOTE — ED Notes (Signed)
Per EMS- Patient is a resident of [facility]. Patient became lethargic and having altered mental status. Patiaent is lethargic and slow to respond to verbal stimuli. EMS attempted to give patient Tylenol for temperature of 101.8 temporally, but patient spit the pills out. Patient has a cough, but lungs clear.

## 2014-10-19 NOTE — ED Provider Notes (Addendum)
CSN: [id number]     Arrival date & time 10/19/14  1749 History   First MD Initiated Contact with Patient 10/19/14 1803     Chief Complaint  Patient presents with  . Fever  . Altered Mental Status      HPI  Patient presents via EMS from [facility] care facility. Patient was less active today and less verbal today. Slower to respond. Found to have temperature 101.8 and transferred here. Given Tylenol but "spit it out". Has had recent cough per the report.  Past medical history hypertension renal insufficiency anemia and trouble tachycardia CVA chronic pain, arthritis.  No known cardiac disease.  Past Medical History  Diagnosis Date  . Hypertension   . Head ache   . Renal insufficiency, mild   . Depression   . Fecal occult blood test positive   . Restless leg syndrome   . Anemia   . Cataracts, bilateral   . Hemorrhoids   . Rupture of rotator cuff of shoulder     s/p repair by Dr. Berenice Primas 7/07. Follows with Dr. Tamera Punt for possible shoulder surgery.   . Lumbar spinal stenosis     gets Spinal injections by Dr. Marlaine Hind   . Postnasal drip   . Atrial tachycardia     echo 04/08/11: EF 55-60%, mild LVH, grade 1 diast dysfxn;    s/p RFCA with Dr. Lovena Le  04/09/11  . Hyperkalemia 04/08/2011  . Unspecified deficiency anemia   . Osteoporosis, unspecified 04/09/2013    DEXA 5/07 : T L forearm -2.9. L femur -0.6, R femur -1.4. [name]  . CVA (cerebral infarction) 04/09/2013    MRI 2005 : Multiple tiny old lacunar infarcts as well as scattered white matter small vessel ischemic disease. Pt asymptomatic and denies TIA / CVA hx.    . Chronic pain syndrome 04/09/2013    On chronic opioids. Cervical MRI 11/ 2012 : Diffuse cervical spondylosis, Diffuse degenerative disc disease with multilevel foraminal stenosis. Possible T1 nerve root involvement. Lumbar MRI 2009 : prior fusion at L3-4 and degenerative disc disease Rotator cuff repair : Dr. Berenice Primas 7/07. Follows with Dr. Tamera Punt  for possible shoulder surgery.  H/O lumbar spinal stenosis with steroid injections. Mo  . Arthritis     b/l shoulder R>L  . Rotator cuff tear arthropathy     03/22/13 dx [facility] (Dr. Tamera Punt) considering surgery  . Stroke   . History of blood transfusion 2014    anemia-    Past Surgical History  Procedure Laterality Date  . Doppler echocardiography  2004  . Rotator cuff repair      right  . Back surgery      lumbar x 2, 2000 and 2004 by Dr. Deri Fuelling.   . Eye surgery Bilateral     cataract  . Abdominal hysterectomy    . Orif finger fracture Left     ring finger  . Lumbar laminectomy/decompression microdiscectomy Left 08/06/2013    Procedure: Left Thoracic eleven-twelve microdiskectomy ;  Surgeon: Charlie Pitter, MD;  Location: [place] [facility];  Service: Neurosurgery;  Laterality: Left;  Left Thoracic eleven-twelve microdiskectomy    Family History  Problem Relation Age of Onset  . Coronary artery disease Neg Hx     premature   History  Substance Use Topics  . Smoking status: Never Smoker   . Smokeless tobacco: Not on file  . Alcohol Use: No   OB History    No data available  Review of Systems  Unable to perform ROS: Dementia      Allergies  Codeine and Ibuprofen  Home Medications   Prior to Admission medications   Medication Sig Start Date End Date Taking? Authorizing Provider  CALCIUM PO Take 60 mg by mouth 2 (two) times daily.   Yes Historical Provider, MD  carboxymethylcellulose (REFRESH PLUS) 0.5 % SOLN Place 1 drop into the right eye 3 (three) times daily.    Yes Historical Provider, MD  diclofenac sodium (VOLTAREN) 1 % GEL Apply 4 g topically 4 (four) times daily. Bilateral knees   Yes Historical Provider, MD  ferrous sulfate 325 (65 FE) MG tablet Take 325 mg by mouth 3 (three) times daily with meals.    Yes Historical Provider, MD  fluticasone (FLONASE) 50 MCG/ACT nasal spray Place 1 spray into both nostrils daily as needed for allergies or  rhinitis (congestion).   Yes Historical Provider, MD  gabapentin (NEURONTIN) 600 MG tablet Take 600 mg by mouth 3 (three) times daily.   Yes Historical Provider, MD  HYDROcodone-acetaminophen (NORCO) 10-325 MG per tablet Take one tablet by mouth every 4 hours for pain. Not to exceed 3gm apap from all sources/24hr 10/13/14  Yes Lauree Chandler, NP  Linaclotide (LINZESS) 145 MCG CAPS capsule Take 145 mcg by mouth daily.   Yes Historical Provider, MD  Multiple Vitamins-Minerals (DECUBI-VITE PO) Take 1 capsule by mouth daily.    Yes Historical Provider, MD  pantoprazole (PROTONIX) 40 MG tablet Take 40 mg by mouth daily.   Yes Historical Provider, MD  sertraline (ZOLOFT) 25 MG tablet Take 75 mg by mouth daily.   Yes Historical Provider, MD  fentaNYL (DURAGESIC - DOSED MCG/HR) 25 MCG/HR patch Place 1 patch (25 mcg total) onto the skin every 3 (three) days. 10/14/14   Blanchie Serve, MD  HYDROcodone-acetaminophen (NORCO/VICODIN) 5-325 MG per tablet Take 1 tablet by mouth every 6 (six) hours as needed (Breakthrough pain.). 12/03/13   Estill Dooms, MD  loperamide (IMODIUM) 2 MG capsule Take 2 mg by mouth as needed for diarrhea or loose stools.    Historical Provider, MD   BP 201/93 mmHg  Pulse 91  Temp(Src) 101.1 F (38.4 C) (Rectal)  Resp 24  SpO2 96% Physical Exam  Constitutional:  Patient sits with her eyes closed. She will awaken to voice. Will speak one word at a time but only intermittently typically just "yes" or "no".  HENT:  Atraumatic. Conjunctiva not pale. No scleral icterus. Meters members and mouth are slightly dry.  Eyes:  Equal reactive.  Neck:  Neck is supple. No JVD.  Cardiovascular:  No dependent edema. Not tachycardic.  Pulmonary/Chest:  Diffuse scattered rhonchi. No marked increased work of breathing. No prolongation.  Abdominal:  Soft benign abdomen.  Musculoskeletal:  Moves all 4 extremities. generalized weakness.  Neurological:  Nonfocal. However minimally verbal.   Skin:  No rash, no ulcerations.    ED Course  Procedures (including critical care time) Labs Review Labs Reviewed  COMPREHENSIVE METABOLIC PANEL - Abnormal; Notable for the following:    Calcium 7.9 (*)    Total Protein 8.6 (*)    Albumin 3.2 (*)    AST 73 (*)    Alkaline Phosphatase 139 (*)    GFR calc non Af Amer 73 (*)    GFR calc Af Amer 85 (*)    Anion gap 4 (*)    All other components within normal limits  TROPONIN I - Abnormal; Notable for the following:  Troponin I 0.06 (*)    All other components within normal limits  URINALYSIS, ROUTINE W REFLEX MICROSCOPIC - Abnormal; Notable for the following:    Hgb urine dipstick SMALL (*)    Protein, ur 100 (*)    All other components within normal limits  CBC - Abnormal; Notable for the following:    WBC 10.8 (*)    RBC 3.38 (*)    Hemoglobin 9.7 (*)    HCT 32.5 (*)    MCHC 29.8 (*)    RDW 16.3 (*)    All other components within normal limits  DIFFERENTIAL - Abnormal; Notable for the following:    Neutro Abs 7.8 (*)    All other components within normal limits  BLOOD GAS, VENOUS - Abnormal; Notable for the following:    pH, Ven 7.369 (*)    Bicarbonate 27.5 (*)    Acid-Base Excess 2.5 (*)    All other components within normal limits  TROPONIN I - Abnormal; Notable for the following:    Troponin I 0.06 (*)    All other components within normal limits  CULTURE, BLOOD (ROUTINE X 2)  CULTURE, BLOOD (ROUTINE X 2)  URINE CULTURE  URINE MICROSCOPIC-ADD ON  CBC WITH DIFFERENTIAL  I-STAT CG4 LACTIC ACID, ED    Imaging Review Dg Chest Port 1 View  10/19/2014   CLINICAL DATA:  Fever, tachycardia, hypertension  EXAM: PORTABLE CHEST - 1 VIEW  COMPARISON:  06/28/2014  FINDINGS: There is bilateral patchy interstitial and alveolar airspace opacities. There is no pleural effusion or pneumothorax. The heart mediastinum are stable. There is no acute osseous abnormality. There is osteoarthritis of bilateral glenohumeral joints.   IMPRESSION: Bilateral patchy interstitial and alveolar airspace opacities most concerning for multilobar pneumonia including atypical infection.   Electronically Signed   By: Kathreen Devoid   On: 10/19/2014 19:54     EKG Interpretation   Date/Time:  Sunday October 19 2014 18:14:28 EST Ventricular Rate:  92 PR Interval:  130 QRS Duration: 68 QT Interval:  372 QTC Calculation: 460 R Axis:   58 Text Interpretation:  Sinus rhythm Slow R progression Reconfirmed by Jeneen Rinks   MD, [place] ([id number]) on 10/19/2014 10:04:41 PM       Angiocath insertion Performed by: Lolita Patella  Consent: Verbal consent obtained. Risks and benefits: risks, benefits and alternatives were discussed Time out: Immediately prior to procedure a "time out" was called to verify the correct patient, procedure, equipment, support staff and site/side marked as required.  Preparation: Patient was prepped and draped in the usual sterile fashion.  Vein Location: Right AC   Yes Ultrasound Guided  Gauge: 20g Long  Normal blood return and flush without difficulty Patient tolerance: Patient tolerated the procedure well with no immediate complications.    MDM   Final diagnoses:  Fever  Healthcare-associated pneumonia    There is an initial delay in laboratory testing and occasional because of difficult IV access. He was also able to obtain IV access in the right antecubital fossa.  Given medications and IV fluids and antibiotics after bloods were obtained.  Remains hemodynamically stable. Does not appear overtly septic. Given rectal Tylenol.  WBC of 10.8. Lactate of 2.12. Hemoglobin 9.7. BUN 13 creatinine 0.78. Troponin 0.06. EKG shows no acute or ischemic changes injury or ectopy. No ST elevation.  Chest x-ray shows bilateral infiltrates. Per radiologist report concern for multilobar or atypical infection.  She does reside at an institution. Therefore given Maxipime, vancomycin. Given Zithromax for  the  possibility of atypical infection.        Tanna Furry, MD 10/19/14 2103  Tanna Furry, MD 10/19/14 2216

## 2014-10-20 ENCOUNTER — Encounter (HOSPITAL_COMMUNITY): Payer: Self-pay | Admitting: *Deleted

## 2014-10-20 DIAGNOSIS — J1529 Pneumonia due to other staphylococcus: Secondary | ICD-10-CM | POA: Diagnosis present

## 2014-10-20 DIAGNOSIS — G9340 Encephalopathy, unspecified: Secondary | ICD-10-CM | POA: Diagnosis present

## 2014-10-20 DIAGNOSIS — M13811 Other specified arthritis, right shoulder: Secondary | ICD-10-CM | POA: Diagnosis present

## 2014-10-20 DIAGNOSIS — G2581 Restless legs syndrome: Secondary | ICD-10-CM | POA: Diagnosis present

## 2014-10-20 DIAGNOSIS — D500 Iron deficiency anemia secondary to blood loss (chronic): Secondary | ICD-10-CM | POA: Diagnosis present

## 2014-10-20 DIAGNOSIS — G894 Chronic pain syndrome: Secondary | ICD-10-CM | POA: Diagnosis present

## 2014-10-20 DIAGNOSIS — F329 Major depressive disorder, single episode, unspecified: Secondary | ICD-10-CM | POA: Diagnosis present

## 2014-10-20 DIAGNOSIS — M542 Cervicalgia: Secondary | ICD-10-CM | POA: Diagnosis not present

## 2014-10-20 DIAGNOSIS — M79604 Pain in right leg: Secondary | ICD-10-CM | POA: Diagnosis not present

## 2014-10-20 DIAGNOSIS — Z90710 Acquired absence of both cervix and uterus: Secondary | ICD-10-CM | POA: Diagnosis not present

## 2014-10-20 DIAGNOSIS — I129 Hypertensive chronic kidney disease with stage 1 through stage 4 chronic kidney disease, or unspecified chronic kidney disease: Secondary | ICD-10-CM | POA: Diagnosis present

## 2014-10-20 DIAGNOSIS — R7881 Bacteremia: Secondary | ICD-10-CM | POA: Diagnosis present

## 2014-10-20 DIAGNOSIS — Y95 Nosocomial condition: Secondary | ICD-10-CM | POA: Diagnosis present

## 2014-10-20 DIAGNOSIS — Z8673 Personal history of transient ischemic attack (TIA), and cerebral infarction without residual deficits: Secondary | ICD-10-CM | POA: Diagnosis not present

## 2014-10-20 DIAGNOSIS — Z885 Allergy status to narcotic agent status: Secondary | ICD-10-CM | POA: Diagnosis not present

## 2014-10-20 DIAGNOSIS — I5032 Chronic diastolic (congestive) heart failure: Secondary | ICD-10-CM | POA: Diagnosis present

## 2014-10-20 DIAGNOSIS — J189 Pneumonia, unspecified organism: Secondary | ICD-10-CM | POA: Diagnosis present

## 2014-10-20 DIAGNOSIS — M79605 Pain in left leg: Secondary | ICD-10-CM | POA: Diagnosis not present

## 2014-10-20 DIAGNOSIS — R51 Headache: Secondary | ICD-10-CM | POA: Diagnosis present

## 2014-10-20 DIAGNOSIS — M810 Age-related osteoporosis without current pathological fracture: Secondary | ICD-10-CM | POA: Diagnosis present

## 2014-10-20 DIAGNOSIS — N189 Chronic kidney disease, unspecified: Secondary | ICD-10-CM | POA: Diagnosis present

## 2014-10-20 DIAGNOSIS — Z79899 Other long term (current) drug therapy: Secondary | ICD-10-CM | POA: Diagnosis not present

## 2014-10-20 DIAGNOSIS — Z888 Allergy status to other drugs, medicaments and biological substances status: Secondary | ICD-10-CM | POA: Diagnosis not present

## 2014-10-20 DIAGNOSIS — M13812 Other specified arthritis, left shoulder: Secondary | ICD-10-CM | POA: Diagnosis present

## 2014-10-20 DIAGNOSIS — I272 Other secondary pulmonary hypertension: Secondary | ICD-10-CM | POA: Diagnosis present

## 2014-10-20 DIAGNOSIS — R197 Diarrhea, unspecified: Secondary | ICD-10-CM | POA: Diagnosis present

## 2014-10-20 LAB — URINE CULTURE
COLONY COUNT: NO GROWTH
CULTURE: NO GROWTH

## 2014-10-20 LAB — STREP PNEUMONIAE URINARY ANTIGEN: Strep Pneumo Urinary Antigen: NEGATIVE

## 2014-10-20 LAB — INFLUENZA PANEL BY PCR (TYPE A & B)
H1N1FLUPCR: NOT DETECTED
Influenza A By PCR: NEGATIVE
Influenza B By PCR: NEGATIVE

## 2014-10-20 MED ORDER — HALOPERIDOL LACTATE 5 MG/ML IJ SOLN: 1.0000 mg | Freq: Four times a day (QID) | INTRAMUSCULAR | Status: DC | PRN

## 2014-10-20 MED ORDER — HYDROCODONE-ACETAMINOPHEN 10-325 MG PO TABS
1.0000 | ORAL_TABLET | ORAL | Status: DC | PRN
  Administered 2014-10-21 – 2014-10-25 (×5): 1 via ORAL
  Filled 2014-10-20 (×5): qty 1

## 2014-10-20 MED ORDER — ALBUTEROL SULFATE (2.5 MG/3ML) 0.083% IN NEBU
2.5000 mg | INHALATION_SOLUTION | RESPIRATORY_TRACT | Status: DC | PRN
  Administered 2014-10-20: 2.5 mg via RESPIRATORY_TRACT
  Filled 2014-10-20: qty 3

## 2014-10-20 MED ORDER — FERROUS SULFATE 325 (65 FE) MG PO TABS
325.0000 mg | ORAL_TABLET | Freq: Three times a day (TID) | ORAL | Status: DC
  Administered 2014-10-20 – 2014-11-02 (×36): 325 mg via ORAL
  Filled 2014-10-20 (×51): qty 1

## 2014-10-20 MED ORDER — ENSURE COMPLETE PO LIQD
237.0000 mL | Freq: Two times a day (BID) | ORAL | Status: DC
  Administered 2014-10-21 – 2014-11-05 (×24): 237 mL via ORAL

## 2014-10-20 MED ORDER — DEXTROSE 5 % IV SOLN
2.0000 g | Freq: Once | INTRAVENOUS | Status: AC
  Administered 2014-10-20: 2 g via INTRAVENOUS
  Filled 2014-10-20: qty 2

## 2014-10-20 MED ORDER — FENTANYL 25 MCG/HR TD PT72
25.0000 ug | MEDICATED_PATCH | TRANSDERMAL | Status: DC
  Administered 2014-10-21 – 2014-11-05 (×6): 25 ug via TRANSDERMAL
  Filled 2014-10-20 (×6): qty 1

## 2014-10-20 MED ORDER — GABAPENTIN 600 MG PO TABS
600.0000 mg | ORAL_TABLET | Freq: Three times a day (TID) | ORAL | Status: DC
  Administered 2014-10-20 – 2014-10-21 (×5): 600 mg via ORAL
  Filled 2014-10-20 (×9): qty 1

## 2014-10-20 MED ORDER — PANTOPRAZOLE SODIUM 40 MG PO TBEC
40.0000 mg | DELAYED_RELEASE_TABLET | Freq: Every day | ORAL | Status: DC
  Administered 2014-10-20 – 2014-10-25 (×6): 40 mg via ORAL
  Filled 2014-10-20 (×6): qty 1

## 2014-10-20 MED ORDER — SERTRALINE HCL 50 MG PO TABS
75.0000 mg | ORAL_TABLET | Freq: Every day | ORAL | Status: DC
  Administered 2014-10-20 – 2014-11-05 (×17): 75 mg via ORAL
  Filled 2014-10-20 (×17): qty 1

## 2014-10-20 NOTE — Progress Notes (Signed)
INITIAL NUTRITION ASSESSMENT  DOCUMENTATION CODES Per approved criteria  -Not Applicable   INTERVENTION: Ensure Complete po BID, each supplement provides 350 kcal and 13 grams of protein  NUTRITION DIAGNOSIS: Inadequate oral intake related to HCAP and fever as evidenced by poor po intake and poor appetite.   Goal: Pt to meet >/= 90% of their estimated nutrition needs   Monitor:  Weight trend, po intake, acceptance of supplements, labs  Reason for Assessment: MST  81 y.o. female  Admitting Dx: HCAP (healthcare-associated pneumonia)  ASSESSMENT: 81 yo female h/o htn, ckd, cva, chronic pain sent in from her snf for ams and fever. History unobtainable from pt due to ams. She appears uncomfortable grimacing/agitated but cannot verbalize if she is in pain. Follows simple commands. Unknown baseline.  - Pt with stable wt. She reports a poor appetite and po intake is recorded as 0%. She says that she has only had sips of water with her medications. Pt encouraged to eat small portions. She agreed to try Ensure Complete. Pt with moderate muscle loss at the temples.   Height: Ht Readings from Last 1 Encounters:  09/18/14 5\' 2"  (1.575 m)    Weight: Wt Readings from Last 1 Encounters:  10/19/14 141 lb 12.1 oz (64.3 kg)    Ideal Body Weight: 110 lbs  % Ideal Body Weight: 128%  Wt Readings from Last 10 Encounters:  10/19/14 141 lb 12.1 oz (64.3 kg)  09/18/14 136 lb (61.689 kg)  08/05/14 136 lb (61.689 kg)  07/01/14 132 lb (59.875 kg)  06/17/14 131 lb (59.421 kg)  05/23/14 128 lb (58.06 kg)  04/15/14 138 lb (62.596 kg)  04/04/14 141 lb (63.957 kg)  12/13/13 144 lb (65.318 kg)  11/29/13 136 lb (61.689 kg)    Usual Body Weight: 136-141 lbs  % Usual Body Weight: 100%  BMI:  Body mass index is 25.92 kg/(m^2).  Estimated Nutritional Needs: Kcal: 1600-1800 Protein: 80-90 g Fluid: 1.6-1.8 L/day  Skin: intact  Diet Order: Diet full liquid  EDUCATION  NEEDS: -Education needs addressed   Intake/Output Summary (Last 24 hours) at 10/20/14 1705 Last data filed at 10/20/14 1015  Gross per 24 hour  Intake  630.5 ml  Output    300 ml  Net  330.5 ml    Last BM: 1/11   Labs:   Recent Labs Lab 10/19/14 1843  NA 137  K 4.3  CL 108  CO2 25  BUN 13  CREATININE 0.78  CALCIUM 7.9*  GLUCOSE 94    CBG (last 3)  No results for input(s): GLUCAP in the last 72 hours.  Scheduled Meds: . cloNIDine  0.1 mg Oral Once  . enoxaparin (LOVENOX) injection  40 mg Subcutaneous QHS  . [START ON 10/21/2014] fentaNYL  25 mcg Transdermal Q72H  . ferrous sulfate  325 mg Oral TID WC  . gabapentin  600 mg Oral TID  . pantoprazole  40 mg Oral Daily  . sertraline  75 mg Oral Daily  . sodium chloride  3 mL Intravenous Q12H  . vancomycin  500 mg Intravenous Q12H    Continuous Infusions:   Past Medical History  Diagnosis Date  . Hypertension   . Head ache   . Renal insufficiency, mild   . Depression   . Fecal occult blood test positive   . Restless leg syndrome   . Anemia   . Cataracts, bilateral   . Hemorrhoids   . Rupture of rotator cuff of shoulder     s/p  repair by Dr. Berenice Primas 7/07. Follows with Dr. Tamera Punt for possible shoulder surgery.   . Lumbar spinal stenosis     gets Spinal injections by Dr. Marlaine Hind   . Postnasal drip   . Atrial tachycardia     echo 04/08/11: EF 55-60%, mild LVH, grade 1 diast dysfxn;    s/p RFCA with Dr. Lovena Le  04/09/11  . Hyperkalemia 04/08/2011  . Unspecified deficiency anemia   . Osteoporosis, unspecified 04/09/2013    DEXA 5/07 : T L forearm -2.9. L femur -0.6, R femur -1.4. [name]  . CVA (cerebral infarction) 04/09/2013    MRI 2005 : Multiple tiny old lacunar infarcts as well as scattered white matter small vessel ischemic disease. Pt asymptomatic and denies TIA / CVA hx.    . Chronic pain syndrome 04/09/2013    On chronic opioids. Cervical MRI 11/ 2012 : Diffuse cervical spondylosis,  Diffuse degenerative disc disease with multilevel foraminal stenosis. Possible T1 nerve root involvement. Lumbar MRI 2009 : prior fusion at L3-4 and degenerative disc disease Rotator cuff repair : Dr. Berenice Primas 7/07. Follows with Dr. Tamera Punt for possible shoulder surgery.  H/O lumbar spinal stenosis with steroid injections. Mo  . Arthritis     b/l shoulder R>L  . Rotator cuff tear arthropathy     03/22/13 dx [facility] (Dr. Tamera Punt) considering surgery  . Stroke   . History of blood transfusion 2014    anemia-     Past Surgical History  Procedure Laterality Date  . Doppler echocardiography  2004  . Rotator cuff repair      right  . Back surgery      lumbar x 2, 2000 and 2004 by Dr. Deri Fuelling.   . Eye surgery Bilateral     cataract  . Abdominal hysterectomy    . Orif finger fracture Left     ring finger  . Lumbar laminectomy/decompression microdiscectomy Left 08/06/2013    Procedure: Left Thoracic eleven-twelve microdiskectomy ;  Surgeon: Charlie Pitter, MD;  Location: [facility];  Service: Neurosurgery;  Laterality: Left;  Left Thoracic eleven-twelve microdiskectomy     Laurette Schimke MS, RD, LDN

## 2014-10-20 NOTE — Progress Notes (Signed)
[facility] PROGRESS NOTE  Assessment/Plan: HCAP (healthcare-associated pneumonia) - still having fever, cont IV Vanc and cefepime started on 1.10.2015. - no cough.  Encephalopathy acute - due to infectious etiology, ? If h/o dementia. - she is also and narcotics at home, which can be contributing to her confusion, i will go head an hold this.  Chronic pain syndrome - hold narcotics.  Diastolic dysfunction - kvo IF fluids.  Essential hypertension - resume home meds  Code Status: full Family Communication: none Disposition Plan: inpatient   Consultants:  none  Procedures:  CXR  Antibiotics:  Vanc and cefepime 1.10.2016  HPI/Subjective: No complains  Objective: Filed Vitals:   10/19/14 2300 10/19/14 2330 10/20/14 0301 10/20/14 0458  BP: 152/68 146/62  150/71  Pulse: 97 92  90  Temp:  97.9 F (36.6 C) 101.2 F (38.4 C) 99.6 F (37.6 C)  TempSrc:  Oral Rectal Oral  Resp: 29 32    Weight:  64.3 kg (141 lb 12.1 oz)    SpO2: 100% 98%  100%    Intake/Output Summary (Last 24 hours) at 10/20/14 0943 Last data filed at 10/20/14 0615  Gross per 24 hour  Intake  630.5 ml  Output    300 ml  Net  330.5 ml   Filed Weights   10/19/14 2330  Weight: 64.3 kg (141 lb 12.1 oz)    Exam:  General: Alert, awake, oriented x1, in no acute distress.  HEENT: No bruits, no goiter.  Heart: Regular rate and rhythm. Lungs: Good air movement, clear Abdomen: Soft, nontender, nondistended, positive bowel sounds.   Data Reviewed: Basic Metabolic Panel:  Recent Labs Lab 10/19/14 1843  NA 137  K 4.3  CL 108  CO2 25  GLUCOSE 94  BUN 13  CREATININE 0.78  CALCIUM 7.9*   Liver Function Tests:  Recent Labs Lab 10/19/14 1843  AST 73*  ALT 20  ALKPHOS 139*  BILITOT 0.7  PROT 8.6*  ALBUMIN 3.2*   No results for input(s): LIPASE, AMYLASE in the last 168 hours. No results for input(s): AMMONIA in the last 168 hours. CBC:  Recent Labs Lab  10/19/14 1959  WBC 10.8*  NEUTROABS 7.8*  HGB 9.7*  HCT 32.5*  MCV 96.2  PLT 214   Cardiac Enzymes:  Recent Labs Lab 10/19/14 1843 10/19/14 1959  TROPONINI 0.06* 0.06*   BNP (last 3 results) No results for input(s): PROBNP in the last 8760 hours. CBG: No results for input(s): GLUCAP in the last 168 hours.  No results found for this or any previous visit (from the past 240 hour(s)).   Studies: Dg Chest Port 1 View  10/19/2014   CLINICAL DATA:  Fever, tachycardia, hypertension  EXAM: PORTABLE CHEST - 1 VIEW  COMPARISON:  06/28/2014  FINDINGS: There is bilateral patchy interstitial and alveolar airspace opacities. There is no pleural effusion or pneumothorax. The heart mediastinum are stable. There is no acute osseous abnormality. There is osteoarthritis of bilateral glenohumeral joints.  IMPRESSION: Bilateral patchy interstitial and alveolar airspace opacities most concerning for multilobar pneumonia including atypical infection.   Electronically Signed   By: Kathreen Devoid   On: 10/19/2014 19:54    Scheduled Meds: . aztreonam  2 g Intravenous 3 times per day  . cloNIDine  0.1 mg Oral Once  . enoxaparin (LOVENOX) injection  40 mg Subcutaneous QHS  . LORazepam  1 mg Intravenous Once  . sodium chloride  3 mL Intravenous Q12H  . vancomycin  500 mg Intravenous  Q12H   Continuous Infusions:    Charlynne Cousins  [facility] Pager [phone]. If 8PM-8AM, please contact night-coverage at [email or web address], password [facility] 10/20/2014, 9:43 AM  LOS: 1 day

## 2014-10-20 NOTE — Progress Notes (Signed)
UR completed 

## 2014-10-20 NOTE — Progress Notes (Signed)
CSW is assisting with d/c planning. Pt is from [facility]. Family contacted and would like pt to return when stable. Clinicals provided to SNF. They will readmit following hospital d/c.  Werner Lean LCSW [phone]

## 2014-10-21 LAB — LEGIONELLA ANTIGEN, URINE

## 2014-10-21 MED ORDER — ALBUTEROL SULFATE (2.5 MG/3ML) 0.083% IN NEBU
2.5000 mg | INHALATION_SOLUTION | RESPIRATORY_TRACT | Status: DC | PRN
Start: 2014-10-21 — End: 2014-10-27

## 2014-10-21 MED ORDER — ALBUTEROL SULFATE (2.5 MG/3ML) 0.083% IN NEBU: 2.5000 mg | INHALATION_SOLUTION | RESPIRATORY_TRACT | Status: DC

## 2014-10-21 MED ORDER — ALBUTEROL SULFATE (2.5 MG/3ML) 0.083% IN NEBU
2.5000 mg | INHALATION_SOLUTION | Freq: Four times a day (QID) | RESPIRATORY_TRACT | Status: DC
  Administered 2014-10-21 – 2014-10-22 (×3): 2.5 mg via RESPIRATORY_TRACT
  Filled 2014-10-21 (×3): qty 3

## 2014-10-21 MED ORDER — DEXTROSE 5 % IV SOLN
2.0000 g | INTRAVENOUS | Status: DC
  Administered 2014-10-21: 2 g via INTRAVENOUS
  Filled 2014-10-21 (×2): qty 2

## 2014-10-21 NOTE — Care Management Note (Signed)
    Page 1 of 1   10/21/2014     2:20:47 PM CARE MANAGEMENT NOTE 10/21/2014  Patient:  [name]   Account Number:  [id number]  Date Initiated:  10/21/2014  Documentation initiated by:  [facility]  Subjective/Objective Assessment:   adm: fever     Action/Plan:   discharge planning   Anticipated DC Date:  10/22/2014   Anticipated DC Plan:  [place]  CM consult      Choice offered to / List presented to:             Status of service:  Completed, signed off Medicare Important Message given?   (If response is "NO", the following Medicare IM given date fields will be blank) Date Medicare IM given:   Medicare IM given by:   Date Additional Medicare IM given:   Additional Medicare IM given by:    Discharge Disposition:  [place]  Per UR Regulation:    If discussed at Long Length of Stay Meetings, dates discussed:    Comments:  10/21/14 14:00 CM notes pt to go to SNF;SW arranging.  No other CM needs were communicated. Mariane Masters, BSN, CM [phone].

## 2014-10-21 NOTE — Progress Notes (Signed)
[facility] PROGRESS NOTE  Assessment/Plan: HCAP (healthcare-associated pneumonia) - Defeverce, cont IV Vanc and cefepime started on 1.10.2015. - Cont IV antibiotics. - schedule albuterol treatment.  Encephalopathy acute - due to infectious etiology, ? If h/o dementia. - she is also and narcotics at home, which can be contributing to her confusion, i will go head an hold this.  Chronic pain syndrome - hold narcotics.  Diastolic dysfunction - kvo IF fluids.  Essential hypertension - resume home meds  Mild elevation in troponins: - no CP or SOB, no EKG non specific t wave changes.   Code Status: full Family Communication: none Disposition Plan: inpatient   Consultants:  none  Procedures:  CXR  Antibiotics:  Vanc and cefepime 1.10.2016  HPI/Subjective: No complains  Objective: Filed Vitals:   10/20/14 0458 10/20/14 1518 10/20/14 2055 10/21/14 0435  BP: 150/71 171/77 168/72 148/76  Pulse: 90 81 88 86  Temp: 99.6 F (37.6 C) 99.7 F (37.6 C) 99.5 F (37.5 C) 99.1 F (37.3 C)  TempSrc: Oral Oral Oral Oral  Resp:  20 18 18   Height:  5\' 3"  (1.6 m)    Weight:  64.3 kg (141 lb 12.1 oz)    SpO2: 100% 100% 99% 98%    Intake/Output Summary (Last 24 hours) at 10/21/14 1121 Last data filed at 10/21/14 1003  Gross per 24 hour  Intake 254.84 ml  Output      0 ml  Net 254.84 ml   Filed Weights   10/19/14 2330 10/20/14 1518  Weight: 64.3 kg (141 lb 12.1 oz) 64.3 kg (141 lb 12.1 oz)    Exam:  General: Alert, awake, oriented x1, in no acute distress.  HEENT: No bruits, no goiter.  Heart: Regular rate and rhythm. Lungs: Good air movement, clear Abdomen: Soft, nontender, nondistended, positive bowel sounds.   Data Reviewed: Basic Metabolic Panel:  Recent Labs Lab 10/19/14 1843  NA 137  K 4.3  CL 108  CO2 25  GLUCOSE 94  BUN 13  CREATININE 0.78  CALCIUM 7.9*   Liver Function Tests:  Recent Labs Lab 10/19/14 1843  AST 73*  ALT  20  ALKPHOS 139*  BILITOT 0.7  PROT 8.6*  ALBUMIN 3.2*   No results for input(s): LIPASE, AMYLASE in the last 168 hours. No results for input(s): AMMONIA in the last 168 hours. CBC:  Recent Labs Lab 10/19/14 1959  WBC 10.8*  NEUTROABS 7.8*  HGB 9.7*  HCT 32.5*  MCV 96.2  PLT 214   Cardiac Enzymes:  Recent Labs Lab 10/19/14 1843 10/19/14 1959  TROPONINI 0.06* 0.06*   BNP (last 3 results) No results for input(s): PROBNP in the last 8760 hours. CBG: No results for input(s): GLUCAP in the last 168 hours.  Recent Results (from the past 240 hour(s))  Urine culture     Status: None   Collection Time: 10/19/14  6:16 PM  Result Value Ref Range Status   Specimen Description URINE, CATHETERIZED  Final   Special Requests NONE  Final   Colony Count NO GROWTH Performed at [facility]   Final   Culture NO GROWTH Performed at [facility]   Final   Report Status 10/20/2014 FINAL  Final  Culture, blood (routine x 2)     Status: None (Preliminary result)   Collection Time: 10/19/14  7:01 PM  Result Value Ref Range Status   Specimen Description BLOOD RIGHT ANTECUBITAL  Final   Special Requests BOTTLES DRAWN AEROBIC AND ANAEROBIC 3CC  Final   Culture   Final    GRAM POSITIVE COCCI IN CLUSTERS Performed at [facility]    Report Status PENDING  Incomplete  Culture, blood (routine x 2)     Status: None (Preliminary result)   Collection Time: 10/19/14  7:02 PM  Result Value Ref Range Status   Specimen Description BLOOD RIGHT HAND  Final   Special Requests BOTTLES DRAWN AEROBIC AND ANAEROBIC 4CC  Final   Culture   Final    GRAM POSITIVE COCCI IN CLUSTERS Performed at [facility]    Report Status PENDING  Incomplete     Studies: Dg Chest Port 1 View  10/19/2014   CLINICAL DATA:  Fever, tachycardia, hypertension  EXAM: PORTABLE CHEST - 1 VIEW  COMPARISON:  06/28/2014  FINDINGS: There is bilateral patchy interstitial and alveolar airspace  opacities. There is no pleural effusion or pneumothorax. The heart mediastinum are stable. There is no acute osseous abnormality. There is osteoarthritis of bilateral glenohumeral joints.  IMPRESSION: Bilateral patchy interstitial and alveolar airspace opacities most concerning for multilobar pneumonia including atypical infection.   Electronically Signed   By: Kathreen Devoid   On: 10/19/2014 19:54    Scheduled Meds: . cloNIDine  0.1 mg Oral Once  . enoxaparin (LOVENOX) injection  40 mg Subcutaneous QHS  . feeding supplement (ENSURE COMPLETE)  237 mL Oral BID BM  . fentaNYL  25 mcg Transdermal Q72H  . ferrous sulfate  325 mg Oral TID WC  . gabapentin  600 mg Oral TID  . pantoprazole  40 mg Oral Daily  . sertraline  75 mg Oral Daily  . sodium chloride  3 mL Intravenous Q12H  . vancomycin  500 mg Intravenous Q12H   Continuous Infusions:    Charlynne Cousins  [facility] Pager [phone]. If 8PM-8AM, please contact night-coverage at [email or web address], password [facility] 10/21/2014, 11:21 AM  LOS: 2 days

## 2014-10-21 NOTE — Evaluation (Signed)
Physical Therapy Evaluation Patient Details Name: [name] MRN: [id number] DOB: 13-Mar-1925 Today's Date: 10/21/2014   History of Present Illness  81 yo female admitted with Pna, AMS, fever. Hx of t11-T12 laminotomy-2014, HTn, CVA, spinal stenosis, atrial tachycardia, chroni pain-opiods.   Clinical Impression  On eval, pt required Min assist for bed mobility and Max assist for squat pivot transfer. Pt tolerated fairly well. Fatigues easily. Total assist for toilet hygiene on bsc. Recommend return to SNF.     Follow Up Recommendations SNF    Equipment Recommendations  None recommended by PT    Recommendations for Other Services       Precautions / Restrictions Precautions Precautions: Fall Restrictions Weight Bearing Restrictions: No      Mobility  Bed Mobility Overal bed mobility: Needs Assistance Bed Mobility: Supine to Sit     Supine to sit: Min assist;HOB elevated     General bed mobility comments: Assist for trunk to full upright.   Transfers Overall transfer level: Needs assistance   Transfers: Squat Pivot Transfers     Squat pivot transfers: Max assist     General transfer comment: x2, bed>bsc, bsc>recliner. Assist to weightshift and safely complete transfer. Pt holds onto armrests for support to assist as well  Ambulation/Gait             General Gait Details: NT-pt nonambulatory  Stairs            Wheelchair Mobility    Modified Rankin (Stroke Patients Only)       Balance Overall balance assessment: Needs assistance Sitting-balance support: Bilateral upper extremity supported;Feet supported Sitting balance-Leahy Scale: Fair       Standing balance-Leahy Scale: Poor                               Pertinent Vitals/Pain Pain Assessment: Faces Faces Pain Scale: Hurts even more Pain Location: bil LEs, buttock area Pain Descriptors / Indicators: Sore Pain Intervention(s): Monitored during session;Repositioned     Home Living Family/patient expects to be discharged to:: Skilled nursing facility                      Prior Function Level of Independence: Needs assistance   Gait / Transfers Assistance Needed: Max assist for transfers only           Hand Dominance        Extremity/Trunk Assessment   Upper Extremity Assessment: Generalized weakness           Lower Extremity Assessment: RLE deficits/detail RLE Deficits / Details: strength ~3/5 throughout.     Cervical / Trunk Assessment: Kyphotic  Communication   Communication: No difficulties  Cognition Arousal/Alertness: Awake/alert Behavior During Therapy: WFL for tasks assessed/performed Overall Cognitive Status: Within Functional Limits for tasks assessed                      General Comments      Exercises        Assessment/Plan    PT Assessment Patient needs continued PT services  PT Diagnosis Generalized weakness   PT Problem List Decreased strength;Decreased activity tolerance;Decreased balance;Decreased mobility;Pain  PT Treatment Interventions Functional mobility training;Therapeutic activities;Therapeutic exercise;Patient/family education;Balance training   PT Goals (Current goals can be found in the Care Plan section) Acute Rehab PT Goals Patient Stated Goal: none stated PT Goal Formulation: With patient Time For Goal Achievement: 11/04/14 Potential to Achieve Goals:  Fair    Frequency Min 2X/week   Barriers to discharge        Co-evaluation               End of Session   Activity Tolerance: Patient limited by fatigue Patient left: in chair;with call bell/phone within reach           Time: 1127-1205 PT Time Calculation (min) (ACUTE ONLY): 38 min   Charges:   PT Evaluation $Initial PT Evaluation Tier I: 1 Procedure PT Treatments $Therapeutic Activity: 23-37 mins   PT G Codes:        Weston Anna, MPT Pager: [phone]

## 2014-10-22 LAB — CBC
HEMATOCRIT: 24.8 % — AB (ref 36.0–46.0)
HEMOGLOBIN: 7.5 g/dL — AB (ref 12.0–15.0)
MCH: 28.7 pg (ref 26.0–34.0)
MCHC: 30.2 g/dL (ref 30.0–36.0)
MCV: 95 fL (ref 78.0–100.0)
Platelets: 158 10*3/uL (ref 150–400)
RBC: 2.61 MIL/uL — AB (ref 3.87–5.11)
RDW: 16.1 % — AB (ref 11.5–15.5)
WBC: 9.9 10*3/uL (ref 4.0–10.5)

## 2014-10-22 LAB — VANCOMYCIN, TROUGH: Vancomycin Tr: 13.3 ug/mL (ref 10.0–20.0)

## 2014-10-22 MED ORDER — BUDESONIDE 0.25 MG/2ML IN SUSP
0.2500 mg | Freq: Two times a day (BID) | RESPIRATORY_TRACT | Status: DC
  Administered 2014-10-22 – 2014-11-05 (×28): 0.25 mg via RESPIRATORY_TRACT
  Filled 2014-10-22 (×29): qty 2

## 2014-10-22 MED ORDER — DEXTROSE 5 % IV SOLN
2.0000 g | INTRAVENOUS | Status: DC
  Administered 2014-10-22: 2 g via INTRAVENOUS
  Filled 2014-10-22 (×2): qty 2

## 2014-10-22 MED ORDER — GUAIFENESIN ER 600 MG PO TB12
600.0000 mg | ORAL_TABLET | Freq: Two times a day (BID) | ORAL | Status: DC
  Administered 2014-10-22 – 2014-11-05 (×27): 600 mg via ORAL
  Filled 2014-10-22 (×30): qty 1

## 2014-10-22 MED ORDER — AMOXICILLIN-POT CLAVULANATE 500-125 MG PO TABS: 1.0000 | ORAL_TABLET | Freq: Three times a day (TID) | ORAL | Status: DC

## 2014-10-22 MED ORDER — GABAPENTIN 300 MG PO CAPS
600.0000 mg | ORAL_CAPSULE | Freq: Three times a day (TID) | ORAL | Status: DC
  Administered 2014-10-22 – 2014-10-27 (×17): 600 mg via ORAL
  Filled 2014-10-22 (×18): qty 2

## 2014-10-22 MED ORDER — IPRATROPIUM-ALBUTEROL 0.5-2.5 (3) MG/3ML IN SOLN
3.0000 mL | Freq: Four times a day (QID) | RESPIRATORY_TRACT | Status: DC
  Administered 2014-10-22 – 2014-10-23 (×3): 3 mL via RESPIRATORY_TRACT
  Filled 2014-10-22 (×3): qty 3

## 2014-10-22 MED ORDER — VANCOMYCIN HCL IN DEXTROSE 750-5 MG/150ML-% IV SOLN
750.0000 mg | Freq: Once | INTRAVENOUS | Status: AC
  Administered 2014-10-23: 750 mg via INTRAVENOUS
  Filled 2014-10-22: qty 150

## 2014-10-22 MED ORDER — SODIUM CHLORIDE 0.9 % IV SOLN
INTRAVENOUS | Status: AC
  Administered 2014-10-22: 13:00:00 via INTRAVENOUS

## 2014-10-22 MED ORDER — VANCOMYCIN HCL 500 MG IV SOLR
500.0000 mg | Freq: Two times a day (BID) | INTRAVENOUS | Status: DC
  Filled 2014-10-22: qty 500

## 2014-10-22 MED ORDER — VANCOMYCIN HCL 500 MG IV SOLR
500.0000 mg | Freq: Two times a day (BID) | INTRAVENOUS | Status: DC
  Administered 2014-10-22: 500 mg via INTRAVENOUS
  Filled 2014-10-22: qty 500

## 2014-10-22 NOTE — Progress Notes (Signed)
ANTIBIOTIC CONSULT NOTE - INITIAL  Pharmacy Consult for Vancomycin/cefepime Indication:Bacteremia/ HCAP  Allergies  Allergen Reactions  . Codeine     Hallucinations  . Ibuprofen     Confusion    Patient Measurements: Height: 5\' 3"  (160 cm) Weight: 141 lb 12.1 oz (64.3 kg) IBW/kg (Calculated) : 52.4 Adjusted Body Weight:  Vital Signs: Temp: 98.9 F (37.2 C) (01/13 0506) Temp Source: Oral (01/13 0506) BP: 120/48 mmHg (01/13 0555) Pulse Rate: 62 (01/13 0506) Intake/Output from previous day: 01/12 0701 - 01/13 0700 In: 550 [P.O.:180; I.V.:120; IV Piggyback:250] Out: 50 [Urine:50] Intake/Output from this shift: Total I/O In: 120 [P.O.:120] Out: -   Labs:  Recent Labs  10/19/14 1843 10/19/14 1959 10/22/14 0420  WBC  --  10.8* 9.9  HGB  --  9.7* 7.5*  PLT  --  214 158  CREATININE 0.78  --   --    Estimated Creatinine Clearance: 44.7 mL/min (by C-G formula based on Cr of 0.78). No results for input(s): VANCOTROUGH, VANCOPEAK, VANCORANDOM, GENTTROUGH, GENTPEAK, GENTRANDOM, TOBRATROUGH, TOBRAPEAK, TOBRARND, AMIKACINPEAK, AMIKACINTROU, AMIKACIN in the last 72 hours.   Microbiology: Recent Results (from the past 720 hour(s))  Urine culture     Status: None   Collection Time: 10/19/14  6:16 PM  Result Value Ref Range Status   Specimen Description URINE, CATHETERIZED  Final   Special Requests NONE  Final   Colony Count NO GROWTH Performed at [facility]   Final   Culture NO GROWTH Performed at [facility]   Final   Report Status 10/20/2014 FINAL  Final  Culture, blood (routine x 2)     Status: None (Preliminary result)   Collection Time: 10/19/14  7:01 PM  Result Value Ref Range Status   Specimen Description BLOOD RIGHT ANTECUBITAL  Final   Special Requests BOTTLES DRAWN AEROBIC AND ANAEROBIC 3CC  Final   Culture   Final    STAPHYLOCOCCUS SPECIES (COAGULASE NEGATIVE) Note: Gram Stain Report Called to,Read Back By and Verified With: SHANICE  VENABLE ON 10/20/2014 AT 11:33P BY WILEJ Performed at [facility]    Report Status PENDING  Incomplete  Culture, blood (routine x 2)     Status: None (Preliminary result)   Collection Time: 10/19/14  7:02 PM  Result Value Ref Range Status   Specimen Description BLOOD RIGHT HAND  Final   Special Requests BOTTLES DRAWN AEROBIC AND ANAEROBIC 4CC  Final   Culture   Final    STAPHYLOCOCCUS SPECIES (COAGULASE NEGATIVE) Note: RIFAMPIN AND GENTAMICIN SHOULD NOT BE USED AS SINGLE DRUGS FOR TREATMENT OF STAPH INFECTIONS. Note: Gram Stain Report Called to,Read Back By and Verified With: SHANICE VENABLE ON 10/20/2014 AT 11:33P BY Dennard Nip Performed at [facility]    Report Status PENDING  Incomplete    Medical History: Past Medical History  Diagnosis Date  . Hypertension   . Head ache   . Renal insufficiency, mild   . Depression   . Fecal occult blood test positive   . Restless leg syndrome   . Anemia   . Cataracts, bilateral   . Hemorrhoids   . Rupture of rotator cuff of shoulder     s/p repair by Dr. Berenice Primas 7/07. Follows with Dr. Tamera Punt for possible shoulder surgery.   . Lumbar spinal stenosis     gets Spinal injections by Dr. Marlaine Hind   . Postnasal drip   . Atrial tachycardia     echo 04/08/11: EF 55-60%, mild LVH, grade 1  diast dysfxn;    s/p RFCA with Dr. Lovena Le  04/09/11  . Hyperkalemia 04/08/2011  . Unspecified deficiency anemia   . Osteoporosis, unspecified 04/09/2013    DEXA 5/07 : T L forearm -2.9. L femur -0.6, R femur -1.4. [name]  . CVA (cerebral infarction) 04/09/2013    MRI 2005 : Multiple tiny old lacunar infarcts as well as scattered white matter small vessel ischemic disease. Pt asymptomatic and denies TIA / CVA hx.    . Chronic pain syndrome 04/09/2013    On chronic opioids. Cervical MRI 11/ 2012 : Diffuse cervical spondylosis, Diffuse degenerative disc disease with multilevel foraminal stenosis. Possible T1 nerve root involvement. Lumbar  MRI 2009 : prior fusion at L3-4 and degenerative disc disease Rotator cuff repair : Dr. Berenice Primas 7/07. Follows with Dr. Tamera Punt for possible shoulder surgery.  H/O lumbar spinal stenosis with steroid injections. Mo  . Arthritis     b/l shoulder R>L  . Rotator cuff tear arthropathy     03/22/13 dx [facility] (Dr. Tamera Punt) considering surgery  . Stroke   . History of blood transfusion 2014    anemia-     Medications:  Anti-infectives    Start     Dose/Rate Route Frequency Ordered Stop   10/22/14 2200  vancomycin (VANCOCIN) 500 mg in sodium chloride 0.9 % 100 mL IVPB     500 mg100 mL/hr over 60 Minutes Intravenous Every 12 hours 10/22/14 1235     10/22/14 1500  ceFEPIme (MAXIPIME) 2 g in dextrose 5 % 50 mL IVPB     2 g100 mL/hr over 30 Minutes Intravenous Every 24 hours 10/22/14 1235     10/22/14 1400  amoxicillin-clavulanate (AUGMENTIN) 500-125 MG per tablet 500 mg  Status:  Discontinued     1 tablet Oral 3 times per day 10/22/14 1142 10/22/14 1144   10/21/14 1500  ceFEPIme (MAXIPIME) 2 g in dextrose 5 % 50 mL IVPB  Status:  Discontinued     2 g100 mL/hr over 30 Minutes Intravenous Every 24 hours 10/21/14 1321 10/22/14 1142   10/20/14 1500  ceFEPIme (MAXIPIME) 2 g in dextrose 5 % 50 mL IVPB     2 g100 mL/hr over 30 Minutes Intravenous  Once 10/20/14 1408 10/20/14 1543   10/20/14 1000  vancomycin (VANCOCIN) 500 mg in sodium chloride 0.9 % 100 mL IVPB  Status:  Discontinued     500 mg100 mL/hr over 60 Minutes Intravenous Every 12 hours 10/19/14 2044 10/22/14 1142   10/19/14 2345  aztreonam (AZACTAM) 2 g in dextrose 5 % 50 mL IVPB  Status:  Discontinued     2 g100 mL/hr over 30 Minutes Intravenous 3 times per day 10/19/14 2330 10/20/14 1408   10/19/14 2100  vancomycin (VANCOCIN) IVPB 1000 mg/200 mL premix  Status:  Discontinued     1,000 mg200 mL/hr over 60 Minutes Intravenous  Once 10/19/14 2044 10/19/14 2330   10/19/14 2030  ceFEPIme (MAXIPIME) 2 g in dextrose 5 % 50 mL IVPB     2  g100 mL/hr over 30 Minutes Intravenous  Once 10/19/14 2027 10/19/14 2200   10/19/14 2030  azithromycin (ZITHROMAX) 500 mg in dextrose 5 % 250 mL IVPB     500 mg250 mL/hr over 60 Minutes Intravenous  Once 10/19/14 2027 10/19/14 2259     Assessment: 81yo F from NH with fever, cough, lethergy, and AMS. . Pharmacy is asked to dose Vanc and cefepime for bacteremia and HCAP.   Blood  culture: Gram positive cocci in clusters Bacteremia  Goal of Therapy:  Vancomycin trough level 15-20 mcg/ml  Appropriate antibiotic dosing for renal function; eradication of infection  Plan:   Vanc 500mg  IV q12h  Obtain vanc trough before 2200 dose tonight  Cefepime 2gm IV q24h  Follow renal function. Clinical course    Dolly Rias RPh 10/22/2014, 1:26 PM Pager [phone]

## 2014-10-22 NOTE — Progress Notes (Signed)
Clinical Social Work Department BRIEF PSYCHOSOCIAL ASSESSMENT 10/22/2014  Patient:  [name], [name]     Account Number:  [id number]     Admit date:  10/19/2014  Clinical Social Worker:  Lacie Scotts  Date/Time:  10/22/2014 03:30 PM  Referred by:  CSW  Date Referred:  10/22/2014 Referred for  SNF Placement   Other Referral:   Interview type:  Family Other interview type:    PSYCHOSOCIAL DATA Living Status:  FACILITY Admitted from facility:  Espinoza, Tammy Level of care:  [place] Primary support name:  [name] Primary support relationship to patient:  CHILD, ADULT Degree of support available:   supportive    CURRENT CONCERNS Current Concerns  Post-Acute Placement   Other Concerns:    SOCIAL WORK ASSESSMENT / PLAN Pt is an 81 yr old female admitted from [place]. CSW consulted to assist with d/c planning. Pt is unable to assist with d/c planning needs. Pt's son contacted . Son would like pt to return to SNF following hospital d/c. SNF contacted, clinicals provided. SNF will readmit pt when stable for D/C.   Assessment/plan status:  Psychosocial Support/Ongoing Assessment of Needs Other assessment/ plan:   Information/referral to community resources:   None needed at this time.    PATIENT'S/FAMILY'S RESPONSE TO PLAN OF CARE: Family would pt to return to SNF when stable. Son appreciates assistance offered by CSW.   Werner Lean LCSW [phone]

## 2014-10-22 NOTE — Progress Notes (Signed)
[facility] PROGRESS NOTE  Assessment/Plan: HCAP (healthcare-associated pneumonia) and Gram positive cocci in clusters Bacteremia -no further fever and normal WBC's, cont IV Vanc and cefepime started on 1.10.2015. -will start pulmicort and duonebs -start mucinex and flutter valve -follow clinical response  Encephalopathy acute - due to infectious etiology and use of narcotics at home; ? If h/o dementia. - improving -continue minimizing/holding narcotics  Chronic pain syndrome -described no pain currently -will continue minimizing/holding narcotics.  Diastolic dysfunction -will check daily weights and I's and O's -patient w/o crackles, JVD or LE edema -compensated -continue low sodium diet  Essential hypertension -stable -will continue home meds  Mild elevation in troponins: - no CP or SOB, no EKG changes suggesting ischemia. -will monitor for now -most likely mild demand ischemia with ongoing PNA   Code Status: full Family Communication: none Disposition Plan: inpatient and with plans to go back to SNF at discharge   Consultants:  none  Procedures:  CXR  Antibiotics:  Vanc and cefepime 1.10.2016  HPI/Subjective: No fever, no CP. Still with productive cough and very coarse/rhonchorus BS. Patient denies CP. Patient experienced one small episode of hemoptysis after vigorous coughing spell.   Objective: Filed Vitals:   10/21/14 2108 10/22/14 0506 10/22/14 0555 10/22/14 0925  BP:  99/50 120/48   Pulse:  62    Temp:  98.9 F (37.2 C)    TempSrc:  Oral    Resp:  17    Height:      Weight:      SpO2: 100% 100%  97%    Intake/Output Summary (Last 24 hours) at 10/22/14 1142 Last data filed at 10/22/14 0903  Gross per 24 hour  Intake    630 ml  Output     50 ml  Net    580 ml   Filed Weights   10/19/14 2330 10/20/14 1518  Weight: 64.3 kg (141 lb 12.1 oz) 64.3 kg (141 lb 12.1 oz)    Exam:  General: Alert, awake, oriented x2, in no acute  distress. No fever and feeling somewhat better  HEENT: No bruits, no goiter. No JVD Heart: Regular rate and rhythm; no rubs or gallops Lungs:diffuse rhonchi and mild exp wheezing; very rhonchorus and coarse BS Abdomen: Soft, nontender, nondistended, positive bowel sounds.   Data Reviewed: Basic Metabolic Panel:  Recent Labs Lab 10/19/14 1843  NA 137  K 4.3  CL 108  CO2 25  GLUCOSE 94  BUN 13  CREATININE 0.78  CALCIUM 7.9*   Liver Function Tests:  Recent Labs Lab 10/19/14 1843  AST 73*  ALT 20  ALKPHOS 139*  BILITOT 0.7  PROT 8.6*  ALBUMIN 3.2*   CBC:  Recent Labs Lab 10/19/14 1959 10/22/14 0420  WBC 10.8* 9.9  NEUTROABS 7.8*  --   HGB 9.7* 7.5*  HCT 32.5* 24.8*  MCV 96.2 95.0  PLT 214 158   Cardiac Enzymes:  Recent Labs Lab 10/19/14 1843 10/19/14 1959  TROPONINI 0.06* 0.06*   CBG: No results for input(s): GLUCAP in the last 168 hours.  Recent Results (from the past 240 hour(s))  Urine culture     Status: None   Collection Time: 10/19/14  6:16 PM  Result Value Ref Range Status   Specimen Description URINE, CATHETERIZED  Final   Special Requests NONE  Final   Colony Count NO GROWTH Performed at [facility]   Final   Culture NO GROWTH Performed at [facility]   Final   Report  Status 10/20/2014 FINAL  Final  Culture, blood (routine x 2)     Status: None (Preliminary result)   Collection Time: 10/19/14  7:01 PM  Result Value Ref Range Status   Specimen Description BLOOD RIGHT ANTECUBITAL  Final   Special Requests BOTTLES DRAWN AEROBIC AND ANAEROBIC 3CC  Final   Culture   Final    GRAM POSITIVE COCCI IN CLUSTERS Performed at [facility]    Report Status PENDING  Incomplete  Culture, blood (routine x 2)     Status: None (Preliminary result)   Collection Time: 10/19/14  7:02 PM  Result Value Ref Range Status   Specimen Description BLOOD RIGHT HAND  Final   Special Requests BOTTLES DRAWN AEROBIC AND ANAEROBIC 4CC   Final   Culture   Final    GRAM POSITIVE COCCI IN CLUSTERS Performed at [facility]    Report Status PENDING  Incomplete     Studies: No results found.  Scheduled Meds: . budesonide (PULMICORT) nebulizer solution  0.25 mg Nebulization BID  . cloNIDine  0.1 mg Oral Once  . enoxaparin (LOVENOX) injection  40 mg Subcutaneous QHS  . feeding supplement (ENSURE COMPLETE)  237 mL Oral BID BM  . fentaNYL  25 mcg Transdermal Q72H  . ferrous sulfate  325 mg Oral TID WC  . gabapentin  600 mg Oral TID  . guaiFENesin  600 mg Oral BID  . ipratropium-albuterol  3 mL Nebulization QID  . pantoprazole  40 mg Oral Daily  . sertraline  75 mg Oral Daily  . sodium chloride  3 mL Intravenous Q12H   Continuous Infusions: . sodium chloride     Time: 30 minutes  Barton Dubois  [facility] Pager [phone]. If 8PM-8AM, please contact night-coverage at [email or web address], password [facility] 10/22/2014, 11:42 AM  LOS: 3 days

## 2014-10-22 NOTE — Progress Notes (Signed)
Rx Brief Antibiotic note:  IV Vancomycin   Assessement:  VT=13.3 mg/L  WBCs WNL  Tmax=99  Plan:  Will give 750mg  x1 in am  Then continue Vancomycin 500mg  IV q12h  F/U Scr/additional leves/cultures as neeeded   Dorrene German 10/22/2014 11:37 PM

## 2014-10-23 DIAGNOSIS — R7881 Bacteremia: Secondary | ICD-10-CM

## 2014-10-23 LAB — GRAM STAIN

## 2014-10-23 LAB — CULTURE, BLOOD (ROUTINE X 2)

## 2014-10-23 MED ORDER — IPRATROPIUM-ALBUTEROL 0.5-2.5 (3) MG/3ML IN SOLN
3.0000 mL | Freq: Three times a day (TID) | RESPIRATORY_TRACT | Status: DC
  Administered 2014-10-23 – 2014-10-29 (×18): 3 mL via RESPIRATORY_TRACT
  Filled 2014-10-23 (×18): qty 3

## 2014-10-23 MED ORDER — CEFAZOLIN SODIUM 1-5 GM-% IV SOLN
1.0000 g | Freq: Three times a day (TID) | INTRAVENOUS | Status: DC
  Administered 2014-10-24 – 2014-10-31 (×23): 1 g via INTRAVENOUS
  Filled 2014-10-23 (×26): qty 50

## 2014-10-23 MED ORDER — IPRATROPIUM-ALBUTEROL 0.5-2.5 (3) MG/3ML IN SOLN: 3.0000 mL | Freq: Two times a day (BID) | RESPIRATORY_TRACT | Status: DC

## 2014-10-23 NOTE — Progress Notes (Signed)
[facility] PROGRESS NOTE  Assessment/Plan: HCAP (healthcare-associated pneumonia) and staph areus coagulase negative Bacteremia 2/2 -no further fever and normal WBC's -discussed with ID and recommendations includes 2-D echo, repeat Blood cx's and treat with cefazolin IV for 4 weeks; if abnormalities appreciated on Echo will need 6 weeks of treatment -holding on picc line until second set of blood cx's neg -will continue pulmicort and duonebs -continue use of mucinex and flutter valve -follow clinical response  Encephalopathy acute - due to infectious etiology and use of narcotics at home; ? If h/o dementia. - Mentation continue improving -continue minimizing/holding narcotics and will continue constant reorientation  Chronic pain syndrome -Stable and currently patient described no pain  -will continue minimizing/holding narcotics.  Diastolic dysfunction -will check daily weights and I's and O's -patient w/o crackles, JVD or LE edema -compensated -continue low sodium diet  Essential hypertension -stable -will continue home meds  Mild elevation in troponins: - no CP or SOB, no EKG changes suggesting ischemia. -will monitor for now -most likely mild demand ischemia with ongoing PNA -2-D echo will be ordered for assessment of her bowels given positive staph bacteremia. That will also help to assess ejection fraction and mobility.   Code Status: full Family Communication: none Disposition Plan: inpatient and with plans to go back to SNF at discharge   Consultants:  ID (Dr. Tommy Medal)  Procedures:  CXR  2-D echo  Antibiotics:  Vanc and cefepime 1.10.16>> 1.14.16  Cefazolin   HPI/Subjective: No fever, no CP. With improvement in her air movement but still with increased rhonchorous and coarse breath sounds. Feeling a whole lot better.  Objective: Filed Vitals:   10/23/14 0543 10/23/14 0730 10/23/14 1359 10/23/14 1516  BP: 118/52  126/66   Pulse: 78   81   Temp: 98.1 F (36.7 C)  99.5 F (37.5 C)   TempSrc: Oral  Oral   Resp: 17  18   Height:      Weight: 63.1 kg (139 lb 1.8 oz)     SpO2: 94% 94% 98% 97%    Intake/Output Summary (Last 24 hours) at 10/23/14 1740 Last data filed at 10/23/14 1548  Gross per 24 hour  Intake 1246.67 ml  Output    650 ml  Net 596.67 ml   Filed Weights   10/20/14 1518 10/22/14 1515 10/23/14 0543  Weight: 64.3 kg (141 lb 12.1 oz) 64.5 kg (142 lb 3.2 oz) 63.1 kg (139 lb 1.8 oz)    Exam:  General: Alert, awake, oriented x2, in no acute distress. No fever and feeling better HEENT: No bruits, no goiter. No JVD Heart: Regular rate and rhythm; no rubs or gallops Lungs: improvement in her air movement, positive rhonchi and minimal end exp wheezing; coarse BS Abdomen: Soft, nontender, nondistended, positive bowel sounds.   Data Reviewed: Basic Metabolic Panel:  Recent Labs Lab 10/19/14 1843  NA 137  K 4.3  CL 108  CO2 25  GLUCOSE 94  BUN 13  CREATININE 0.78  CALCIUM 7.9*   Liver Function Tests:  Recent Labs Lab 10/19/14 1843  AST 73*  ALT 20  ALKPHOS 139*  BILITOT 0.7  PROT 8.6*  ALBUMIN 3.2*   CBC:  Recent Labs Lab 10/19/14 1959 10/22/14 0420  WBC 10.8* 9.9  NEUTROABS 7.8*  --   HGB 9.7* 7.5*  HCT 32.5* 24.8*  MCV 96.2 95.0  PLT 214 158   Cardiac Enzymes:  Recent Labs Lab 10/19/14 1843 10/19/14 1959  TROPONINI 0.06* 0.06*  CBG: No results for input(s): GLUCAP in the last 168 hours.  Recent Results (from the past 240 hour(s))  Urine culture     Status: None   Collection Time: 10/19/14  6:16 PM  Result Value Ref Range Status   Specimen Description URINE, CATHETERIZED  Final   Special Requests NONE  Final   Colony Count NO GROWTH Performed at [facility]   Final   Culture NO GROWTH Performed at [facility]   Final   Report Status 10/20/2014 FINAL  Final  Culture, blood (routine x 2)     Status: None   Collection Time: 10/19/14  7:01  PM  Result Value Ref Range Status   Specimen Description BLOOD RIGHT ANTECUBITAL  Final   Special Requests BOTTLES DRAWN AEROBIC AND ANAEROBIC 3CC  Final   Culture   Final    STAPHYLOCOCCUS SPECIES (COAGULASE NEGATIVE) Note: SUSCEPTIBILITIES PERFORMED ON PREVIOUS CULTURE WITHIN THE LAST 5 DAYS. Note: Gram Stain Report Called to,Read Back By and Verified With: SHANICE VENABLE ON 10/20/2014 AT 11:33P BY WILEJ Performed at [facility]    Report Status 10/23/2014 FINAL  Final  Culture, blood (routine x 2)     Status: None   Collection Time: 10/19/14  7:02 PM  Result Value Ref Range Status   Specimen Description BLOOD RIGHT HAND  Final   Special Requests BOTTLES DRAWN AEROBIC AND ANAEROBIC 4CC  Final   Culture   Final    STAPHYLOCOCCUS SPECIES (COAGULASE NEGATIVE) Note: RIFAMPIN AND GENTAMICIN SHOULD NOT BE USED AS SINGLE DRUGS FOR TREATMENT OF STAPH INFECTIONS. Note: Gram Stain Report Called to,Read Back By and Verified With: SHANICE VENABLE ON 10/20/2014 AT 11:33P BY WILEJ Performed at [facility]    Report Status 10/23/2014 FINAL  Final   Organism ID, Bacteria STAPHYLOCOCCUS SPECIES (COAGULASE NEGATIVE)  Final      Susceptibility   Staphylococcus species (coagulase negative) - MIC*    CLINDAMYCIN <=0.25 SENSITIVE Sensitive     ERYTHROMYCIN <=0.25 SENSITIVE Sensitive     GENTAMICIN <=0.5 SENSITIVE Sensitive     LEVOFLOXACIN 4 INTERMEDIATE Intermediate     OXACILLIN <=0.25 SENSITIVE Sensitive     PENICILLIN >=0.5 RESISTANT Resistant     RIFAMPIN <=0.5 SENSITIVE Sensitive     TRIMETH/SULFA <=10 SENSITIVE Sensitive     VANCOMYCIN <=0.5 SENSITIVE Sensitive     TETRACYCLINE <=1 SENSITIVE Sensitive     * STAPHYLOCOCCUS SPECIES (COAGULASE NEGATIVE)  Gram stain     Status: None   Collection Time: 10/23/14  6:19 AM  Result Value Ref Range Status   Specimen Description TRACHEAL SITE  Final   Special Requests NONE  Final   Gram Stain   Final    ABUNDANT WBC PRESENT,  PREDOMINANTLY PMN MODERATE SQUAMOUS EPITHELIAL CELLS PRESENT RARE YEAST Performed at [facility]    Report Status 10/23/2014 FINAL  Final     Studies: No results found.  Scheduled Meds: . budesonide (PULMICORT) nebulizer solution  0.25 mg Nebulization BID  .  ceFAZolin (ANCEF) IV  1 g Intravenous Q8H  . cloNIDine  0.1 mg Oral Once  . enoxaparin (LOVENOX) injection  40 mg Subcutaneous QHS  . feeding supplement (ENSURE COMPLETE)  237 mL Oral BID BM  . fentaNYL  25 mcg Transdermal Q72H  . ferrous sulfate  325 mg Oral TID WC  . gabapentin  600 mg Oral TID  . guaiFENesin  600 mg Oral BID  . ipratropium-albuterol  3 mL Nebulization TID  .  pantoprazole  40 mg Oral Daily  . sertraline  75 mg Oral Daily  . sodium chloride  3 mL Intravenous Q12H   Continuous Infusions:   Time: 30 minutes  Barton Dubois  [facility] Pager [phone]. If 8PM-8AM, please contact night-coverage at [email or web address], password [facility] 10/23/2014, 5:40 PM  LOS: 4 days

## 2014-10-23 NOTE — Progress Notes (Signed)
ANTIBIOTIC CONSULT NOTE - INITIAL  Pharmacy Consult for Cefazolin Indication: Bacteremia  Allergies  Allergen Reactions  . Codeine     Hallucinations  . Ibuprofen     Confusion    Patient Measurements: Height: 5\' 3"  (160 cm) Weight: 139 lb 1.8 oz (63.1 kg) (with two pillows) IBW/kg (Calculated) : 52.4  Vital Signs: Temp: 99.5 F (37.5 C) (01/14 1359) Temp Source: Oral (01/14 1359) BP: 126/66 mmHg (01/14 1359) Pulse Rate: 81 (01/14 1359) Intake/Output from previous day: 01/13 0701 - 01/14 0700 In: 1508.3 [P.O.:530; I.V.:928.3; IV Piggyback:50] Out: 900 [Urine:900] Intake/Output from this shift: Total I/O In: 330 [I.V.:180; IV Piggyback:150] Out: 250 [Urine:250]  Labs:  Recent Labs  10/22/14 0420  WBC 9.9  HGB 7.5*  PLT 158   Estimated Creatinine Clearance: 44.3 mL/min (by C-G formula based on Cr of 0.78).  Recent Labs  10/22/14 2150  [place] 13.3     Microbiology: Recent Results (from the past 720 hour(s))  Urine culture     Status: None   Collection Time: 10/19/14  6:16 PM  Result Value Ref Range Status   Specimen Description URINE, CATHETERIZED  Final   Special Requests NONE  Final   Colony Count NO GROWTH Performed at [facility]   Final   Culture NO GROWTH Performed at [facility]   Final   Report Status 10/20/2014 FINAL  Final  Culture, blood (routine x 2)     Status: None   Collection Time: 10/19/14  7:01 PM  Result Value Ref Range Status   Specimen Description BLOOD RIGHT ANTECUBITAL  Final   Special Requests BOTTLES DRAWN AEROBIC AND ANAEROBIC 3CC  Final   Culture   Final    STAPHYLOCOCCUS SPECIES (COAGULASE NEGATIVE) Note: SUSCEPTIBILITIES PERFORMED ON PREVIOUS CULTURE WITHIN THE LAST 5 DAYS. Note: Gram Stain Report Called to,Read Back By and Verified With: SHANICE VENABLE ON 10/20/2014 AT 11:33P BY WILEJ Performed at [facility]    Report Status 10/23/2014 FINAL  Final  Culture, blood (routine x 2)      Status: None   Collection Time: 10/19/14  7:02 PM  Result Value Ref Range Status   Specimen Description BLOOD RIGHT HAND  Final   Special Requests BOTTLES DRAWN AEROBIC AND ANAEROBIC 4CC  Final   Culture   Final    STAPHYLOCOCCUS SPECIES (COAGULASE NEGATIVE) Note: RIFAMPIN AND GENTAMICIN SHOULD NOT BE USED AS SINGLE DRUGS FOR TREATMENT OF STAPH INFECTIONS. Note: Gram Stain Report Called to,Read Back By and Verified With: SHANICE VENABLE ON 10/20/2014 AT 11:33P BY WILEJ Performed at [facility]    Report Status 10/23/2014 FINAL  Final   Organism ID, Bacteria STAPHYLOCOCCUS SPECIES (COAGULASE NEGATIVE)  Final      Susceptibility   Staphylococcus species (coagulase negative) - MIC*    CLINDAMYCIN <=0.25 SENSITIVE Sensitive     ERYTHROMYCIN <=0.25 SENSITIVE Sensitive     GENTAMICIN <=0.5 SENSITIVE Sensitive     LEVOFLOXACIN 4 INTERMEDIATE Intermediate     OXACILLIN <=0.25 SENSITIVE Sensitive     PENICILLIN >=0.5 RESISTANT Resistant     RIFAMPIN <=0.5 SENSITIVE Sensitive     TRIMETH/SULFA <=10 SENSITIVE Sensitive     VANCOMYCIN <=0.5 SENSITIVE Sensitive     TETRACYCLINE <=1 SENSITIVE Sensitive     * STAPHYLOCOCCUS SPECIES (COAGULASE NEGATIVE)  Gram stain     Status: None   Collection Time: 10/23/14  6:19 AM  Result Value Ref Range Status   Specimen Description TRACHEAL SITE  Final  Special Requests NONE  Final   Gram Stain   Final    ABUNDANT WBC PRESENT, PREDOMINANTLY PMN MODERATE SQUAMOUS EPITHELIAL CELLS PRESENT RARE YEAST Performed at [facility]    Report Status 10/23/2014 FINAL  Final   Assessment: 81yo F from NH with fever, cough, lethergy, and AMS. Single-doses of Cefepime and Azithromycin are ordered in the ED. Pharmacy is asked to dose Vanc for HCAP. Weight 62kg on 09/18/14.  1/10 >> Azithro x1 1/10 >> Cefepime x1, restarted 1/11 1/10 >> Vanc >> 1/14 1/11 >> aztreonam >> 1/11 1/14 >> Ancef >>  Tmax: AF WBCs: WNL Renal: SCr 0.78(1/10), 44  ml/min CG  1/10 blood: 2/2 GPCs in clusters, coag neg staph 1/10 urine: NGF 1/11 strep UAg: negative 1/11 Legionella UAg: IP 1/11 flu panel: not detected  Goal of Therapy:  Appropriate antibiotic dosing for renal function; eradication of infection  Plan:  Start Ancef 1g IV q8h.  Check SCr in AM.   Romeo Rabon, PharmD, pager [phone]. 10/23/2014,5:10 PM.

## 2014-10-24 DIAGNOSIS — R197 Diarrhea, unspecified: Secondary | ICD-10-CM

## 2014-10-24 DIAGNOSIS — I059 Rheumatic mitral valve disease, unspecified: Secondary | ICD-10-CM

## 2014-10-24 DIAGNOSIS — D509 Iron deficiency anemia, unspecified: Secondary | ICD-10-CM

## 2014-10-24 LAB — BASIC METABOLIC PANEL
ANION GAP: 7 (ref 5–15)
BUN: 17 mg/dL (ref 6–23)
CALCIUM: 7.7 mg/dL — AB (ref 8.4–10.5)
CO2: 27 mmol/L (ref 19–32)
Chloride: 104 mEq/L (ref 96–112)
Creatinine, Ser: 0.69 mg/dL (ref 0.50–1.10)
GFR, EST AFRICAN AMERICAN: 88 mL/min — AB (ref >90)
GFR, EST NON AFRICAN AMERICAN: 76 mL/min — AB (ref >90)
GLUCOSE: 92 mg/dL (ref 70–99)
Potassium: 3.8 mmol/L (ref 3.5–5.1)
SODIUM: 138 mmol/L (ref 135–145)

## 2014-10-24 LAB — CBC
HCT: 21.6 % — ABNORMAL LOW (ref 36.0–46.0)
HEMOGLOBIN: 6.8 g/dL — AB (ref 12.0–15.0)
MCH: 29.7 pg (ref 26.0–34.0)
MCHC: 31.5 g/dL (ref 30.0–36.0)
MCV: 94.3 fL (ref 78.0–100.0)
PLATELETS: 176 10*3/uL (ref 150–400)
RBC: 2.29 MIL/uL — ABNORMAL LOW (ref 3.87–5.11)
RDW: 16.1 % — AB (ref 11.5–15.5)
WBC: 12.9 10*3/uL — AB (ref 4.0–10.5)

## 2014-10-24 LAB — CLOSTRIDIUM DIFFICILE BY PCR: CDIFFPCR: NEGATIVE

## 2014-10-24 LAB — HEMOGLOBIN AND HEMATOCRIT, BLOOD
HCT: 24.2 % — ABNORMAL LOW (ref 36.0–46.0)
HEMATOCRIT: 21.7 % — AB (ref 36.0–46.0)
HEMOGLOBIN: 7.6 g/dL — AB (ref 12.0–15.0)
Hemoglobin: 6.8 g/dL — CL (ref 12.0–15.0)

## 2014-10-24 LAB — PREPARE RBC (CROSSMATCH)

## 2014-10-24 LAB — LACTIC ACID, PLASMA: Lactic Acid, Venous: 0.9 mmol/L (ref 0.5–2.2)

## 2014-10-24 MED ORDER — SODIUM CHLORIDE 0.9 % IV SOLN
Freq: Once | INTRAVENOUS | Status: AC
  Administered 2014-10-24: 06:00:00 via INTRAVENOUS

## 2014-10-24 MED ORDER — TRAMADOL HCL 50 MG PO TABS
50.0000 mg | ORAL_TABLET | Freq: Four times a day (QID) | ORAL | Status: DC | PRN
Start: 2014-10-24 — End: 2014-10-31
  Administered 2014-10-24 – 2014-10-31 (×5): 50 mg via ORAL
  Filled 2014-10-24 (×5): qty 1

## 2014-10-24 MED ORDER — SACCHAROMYCES BOULARDII 250 MG PO CAPS
250.0000 mg | ORAL_CAPSULE | Freq: Two times a day (BID) | ORAL | Status: DC
  Administered 2014-10-24 – 2014-11-05 (×22): 250 mg via ORAL
  Filled 2014-10-24 (×25): qty 1

## 2014-10-24 NOTE — Progress Notes (Signed)
Shift Event: Paged secondary to pt with Hgb 6.8, VSS, asymptomatic.  Ordered repeat H/H, lactic acid, type and screen, and transfer of 1 U of PRBC.   [place] [facility] [phone]

## 2014-10-24 NOTE — Progress Notes (Signed)
Physical Therapy Treatment Patient Details Name: [name] MRN: [id number] DOB: [birth year] Today's Date: 10/24/2014    History of Present Illness 81 yo female admitted with Pna, AMS, fever. Hx of t11-T12 laminotomy-2014, HTn, CVA, spinal stenosis, atrial tachycardia, chroni pain-opiods.     PT Comments    Assisted pt out of recliner to amb to BR to void and BM.  Assisted with hygiene.  Poor standing posture forward flex 45 degrees.  Limited activity tolerance as pt was too fatigued to amb in hallway after using BR.  Returned to recliner and positioned to comfort.  Follow Up Recommendations  SNF     Equipment Recommendations       Recommendations for Other Services       Precautions / Restrictions Precautions Precautions: Fall Restrictions Weight Bearing Restrictions: No    Mobility  Bed Mobility               General bed mobility comments: Pt OOB in recliner  Transfers Overall transfer level: Needs assistance Equipment used: Rolling walker (2 wheeled) Transfers: Sit to/from Stand Sit to Stand: Max assist         General transfer comment: 50% VC's on proper hand placement to push self up and safely transition to RW.  Pt unable to stand fully upright.  Poor forward flex posture nearly lacking 45 degrees.  Required direction for turns and safety with targeting chair/commode.  Ambulation/Gait Ambulation/Gait assistance: Min assist;Mod assist Ambulation Distance (Feet): 20 Feet (10 feet x 2 to and from BR) Assistive device: Rolling walker (2 wheeled) Gait Pattern/deviations: Step-to pattern;Step-through pattern;Decreased stride length;Decreased step length - right;Decreased step length - left;Trunk flexed;Shuffle Gait velocity: decreased   General Gait Details: Poor standing balance with inability to stand upright.  Forward flex 45 degrees and unsteady/shudffed gait.    HIGH FALL RISK.   Stairs            Wheelchair Mobility    Modified Rankin  (Stroke Patients Only)       Balance                                    Cognition Arousal/Alertness: Awake/alert Behavior During Therapy: WFL for tasks assessed/performed Overall Cognitive Status: Within Functional Limits for tasks assessed                      Exercises      General Comments        Pertinent Vitals/Pain Pain Assessment: No/denies pain    Home Living                      Prior Function            PT Goals (current goals can now be found in the care plan section) Progress towards PT goals: Progressing toward goals    Frequency  Min 2X/week    PT Plan      Co-evaluation             End of Session Equipment Utilized During Treatment: Gait belt Activity Tolerance: Patient limited by fatigue Patient left: in chair;with call bell/phone within reach     Time: 2979-8921 PT Time Calculation (min) (ACUTE ONLY): 14 min  Charges:  $Gait Training: 8-22 mins                    G Codes:  Rica Koyanagi  PTA [facility]  Acute  Rehab Pager      [phone]

## 2014-10-24 NOTE — Progress Notes (Signed)
Echocardiogram 2D Echocardiogram has been performed.  Joelene Millin 10/24/2014, 9:07 AM

## 2014-10-24 NOTE — Clinical Social Work Note (Signed)
CSW advised by charge RN of plans for blood transfusion today- CSW has updated [facility] and will await further word on d/c date. SNF is agreeable to her return over weekend if stable- will advise weekend CSW of possible dc to assist.  Eduard Clos, MSW, [place]

## 2014-10-24 NOTE — Progress Notes (Signed)
[facility] PROGRESS NOTE  Assessment/Plan: HCAP (healthcare-associated pneumonia) and staph areus coagulase negative Bacteremia 2/2 -no further fever and normal WBC's -discussed with ID and recommendations includes 2-D echo, repeat Blood cx's and treat with cefazolin IV for 4 weeks; if abnormalities appreciated on Echo will need 6 weeks of treatment -holding on picc line until second set of blood cx's neg -will continue pulmicort and duonebs -continue use of mucinex and flutter valve -follow clinical response  Encephalopathy acute - due to infectious etiology and use of narcotics at home; ? If h/o dementia. - Mentation continue improving -continue minimizing/holding narcotics and will continue constant reorientation  Chronic pain syndrome -Stable and currently patient described no pain  -will continue minimizing/holding narcotics.  Diastolic dysfunction -will check daily weights and I's and O's -patient w/o crackles, JVD or LE edema -compensated -continue low sodium diet  Essential hypertension -stable -will continue home meds  Mild elevation in troponins: - no CP or SOB, no EKG changes suggesting ischemia. -will monitor for now -most likely mild demand ischemia with ongoing PNA -2-D echo will be ordered for assessment of her bowels given positive staph bacteremia. That will also help to assess ejection fraction and mobility.  Anemia: iron deficiency -no signs of bleeding -s/p 1 unit of PRBC's -will follow Hgb trend  Diarrhea:  -will check for C. Diff -start florastor  Code Status: full Family Communication: none Disposition Plan: inpatient and with plans to go back to SNF at discharge   Consultants:  ID (Dr. Tommy Medal)  Procedures:  CXR  2-D echo  Antibiotics:  Vanc and cefepime 1.10.16>> 1.14.16  Cefazolin   HPI/Subjective: No fever, no CP. Patient > 3 loose stools today. Also with low Hgb level 6.8 this morning; s/p 1 unit of  PRBC  Objective: Filed Vitals:   10/24/14 0853 10/24/14 1119 10/24/14 1433 10/24/14 1503  BP: 154/65 125/70  153/55  Pulse: 89 82  85  Temp: 98.3 F (36.8 C) 98.7 F (37.1 C)  99.7 F (37.6 C)  TempSrc: Axillary Oral  Oral  Resp: 20 18  18   Height:      Weight:      SpO2: 99% 99% 97% 100%    Intake/Output Summary (Last 24 hours) at 10/24/14 1620 Last data filed at 10/24/14 1400  Gross per 24 hour  Intake   1575 ml  Output    300 ml  Net   1275 ml   Filed Weights   10/22/14 1515 10/23/14 0543 10/24/14 0545  Weight: 64.5 kg (142 lb 3.2 oz) 63.1 kg (139 lb 1.8 oz) 60.8 kg (134 lb 0.6 oz)    Exam:  General: Afebriel, in no acute distress. No fever and feeling better. HEENT: No bruits, no goiter. No JVD Heart: Regular rate and rhythm; no rubs or gallops Lungs: improvement in her air movement, still with positive rhonchi and minimal end exp wheezing; coarse BS Abdomen: Soft, nontender, nondistended, positive bowel sounds.   Data Reviewed: Basic Metabolic Panel:  Recent Labs Lab 10/19/14 1843 10/24/14 0435  NA 137 138  K 4.3 3.8  CL 108 104  CO2 25 27  GLUCOSE 94 92  BUN 13 17  CREATININE 0.78 0.69  CALCIUM 7.9* 7.7*   Liver Function Tests:  Recent Labs Lab 10/19/14 1843  AST 73*  ALT 20  ALKPHOS 139*  BILITOT 0.7  PROT 8.6*  ALBUMIN 3.2*   CBC:  Recent Labs Lab 10/19/14 1959 10/22/14 0420 10/24/14 0435 10/24/14 0702 10/24/14 1515  WBC 10.8* 9.9 12.9*  --   --   NEUTROABS 7.8*  --   --   --   --   HGB 9.7* 7.5* 6.8* 6.8* 7.6*  HCT 32.5* 24.8* 21.6* 21.7* 24.2*  MCV 96.2 95.0 94.3  --   --   PLT 214 158 176  --   --    Cardiac Enzymes:  Recent Labs Lab 10/19/14 1843 10/19/14 1959  TROPONINI 0.06* 0.06*   CBG: No results for input(s): GLUCAP in the last 168 hours.  Recent Results (from the past 240 hour(s))  Urine culture     Status: None   Collection Time: 10/19/14  6:16 PM  Result Value Ref Range Status   Specimen Description  URINE, CATHETERIZED  Final   Special Requests NONE  Final   Colony Count NO GROWTH Performed at [facility]   Final   Culture NO GROWTH Performed at [facility]   Final   Report Status 10/20/2014 FINAL  Final  Culture, blood (routine x 2)     Status: None   Collection Time: 10/19/14  7:01 PM  Result Value Ref Range Status   Specimen Description BLOOD RIGHT ANTECUBITAL  Final   Special Requests BOTTLES DRAWN AEROBIC AND ANAEROBIC 3CC  Final   Culture   Final    STAPHYLOCOCCUS SPECIES (COAGULASE NEGATIVE) Note: SUSCEPTIBILITIES PERFORMED ON PREVIOUS CULTURE WITHIN THE LAST 5 DAYS. Note: Gram Stain Report Called to,Read Back By and Verified With: SHANICE VENABLE ON 10/20/2014 AT 11:33P BY WILEJ Performed at [facility]    Report Status 10/23/2014 FINAL  Final  Culture, blood (routine x 2)     Status: None   Collection Time: 10/19/14  7:02 PM  Result Value Ref Range Status   Specimen Description BLOOD RIGHT HAND  Final   Special Requests BOTTLES DRAWN AEROBIC AND ANAEROBIC 4CC  Final   Culture   Final    STAPHYLOCOCCUS SPECIES (COAGULASE NEGATIVE) Note: RIFAMPIN AND GENTAMICIN SHOULD NOT BE USED AS SINGLE DRUGS FOR TREATMENT OF STAPH INFECTIONS. Note: Gram Stain Report Called to,Read Back By and Verified With: SHANICE VENABLE ON 10/20/2014 AT 11:33P BY WILEJ Performed at [facility]    Report Status 10/23/2014 FINAL  Final   Organism ID, Bacteria STAPHYLOCOCCUS SPECIES (COAGULASE NEGATIVE)  Final      Susceptibility   Staphylococcus species (coagulase negative) - MIC*    CLINDAMYCIN <=0.25 SENSITIVE Sensitive     ERYTHROMYCIN <=0.25 SENSITIVE Sensitive     GENTAMICIN <=0.5 SENSITIVE Sensitive     LEVOFLOXACIN 4 INTERMEDIATE Intermediate     OXACILLIN <=0.25 SENSITIVE Sensitive     PENICILLIN >=0.5 RESISTANT Resistant     RIFAMPIN <=0.5 SENSITIVE Sensitive     TRIMETH/SULFA <=10 SENSITIVE Sensitive     VANCOMYCIN <=0.5 SENSITIVE Sensitive      TETRACYCLINE <=1 SENSITIVE Sensitive     * STAPHYLOCOCCUS SPECIES (COAGULASE NEGATIVE)  Gram stain     Status: None   Collection Time: 10/23/14  6:19 AM  Result Value Ref Range Status   Specimen Description TRACHEAL SITE  Final   Special Requests NONE  Final   Gram Stain   Final    ABUNDANT WBC PRESENT, PREDOMINANTLY PMN MODERATE SQUAMOUS EPITHELIAL CELLS PRESENT RARE YEAST Performed at [facility]    Report Status 10/23/2014 FINAL  Final     Studies: No results found.  Scheduled Meds: . budesonide (PULMICORT) nebulizer solution  0.25 mg Nebulization BID  .  ceFAZolin (ANCEF) IV  1 g Intravenous Q8H  . cloNIDine  0.1 mg Oral Once  . enoxaparin (LOVENOX) injection  40 mg Subcutaneous QHS  . feeding supplement (ENSURE COMPLETE)  237 mL Oral BID BM  . fentaNYL  25 mcg Transdermal Q72H  . ferrous sulfate  325 mg Oral TID WC  . gabapentin  600 mg Oral TID  . guaiFENesin  600 mg Oral BID  . ipratropium-albuterol  3 mL Nebulization TID  . pantoprazole  40 mg Oral Daily  . saccharomyces boulardii  250 mg Oral BID  . sertraline  75 mg Oral Daily  . sodium chloride  3 mL Intravenous Q12H   Continuous Infusions:   Time: 30 minutes  Barton Dubois  [facility] Pager [phone]. If 8PM-8AM, please contact night-coverage at [email or web address], password [facility] 10/24/2014, 4:20 PM  LOS: 5 days

## 2014-10-25 DIAGNOSIS — D638 Anemia in other chronic diseases classified elsewhere: Secondary | ICD-10-CM

## 2014-10-25 LAB — CBC
HCT: 19.8 % — ABNORMAL LOW (ref 36.0–46.0)
HEMOGLOBIN: 6.4 g/dL — AB (ref 12.0–15.0)
MCH: 28.3 pg (ref 26.0–34.0)
MCHC: 32.3 g/dL (ref 30.0–36.0)
MCV: 87.6 fL (ref 78.0–100.0)
Platelets: 177 10*3/uL (ref 150–400)
RBC: 2.26 MIL/uL — ABNORMAL LOW (ref 3.87–5.11)
RDW: 20.5 % — ABNORMAL HIGH (ref 11.5–15.5)
WBC: 14.2 10*3/uL — ABNORMAL HIGH (ref 4.0–10.5)

## 2014-10-25 LAB — PREPARE RBC (CROSSMATCH)

## 2014-10-25 LAB — OCCULT BLOOD X 1 CARD TO LAB, STOOL: FECAL OCCULT BLD: POSITIVE — AB

## 2014-10-25 MED ORDER — SODIUM CHLORIDE 0.9 % IV SOLN: Freq: Once | INTRAVENOUS | Status: DC

## 2014-10-25 MED ORDER — FUROSEMIDE 10 MG/ML IJ SOLN
40.0000 mg | Freq: Once | INTRAMUSCULAR | Status: AC
  Administered 2014-10-25: 40 mg via INTRAVENOUS
  Filled 2014-10-25: qty 4

## 2014-10-25 NOTE — Significant Event (Signed)
Received call from lab at 0616am with critical hemoglobin level of 6.4. Reviewed previous results. Pt noted to have hb level of 6.8 at approx 0700am on 01/15. Pt was transfused 1 unit PRBC 0830-1130am and hemoglobin was 7.6 on recheck at approx 1500pm. Paged on call at [phone] with critical labs. Received return page at [phone], discussed results with on call. Plan to transfuse 1 unit PRBC today. Will cont to monitor. ~ Petra Kuba, RN

## 2014-10-25 NOTE — Progress Notes (Signed)
[facility] PROGRESS NOTE  Assessment/Plan: HCAP (healthcare-associated pneumonia) and staph areus coagulase negative Bacteremia 2/2 -no further fever and normal WBC's now -discussed with ID and recommendations includes 2-D echo, repeat Blood cx's and treat with cefazolin IV for 4 weeks; if abnormalities appreciated on Echo will need 6 weeks of treatment -holding on picc line until second set of blood cx's neg (preliminary) -will continue pulmicort and duonebs -continue use of mucinex and flutter valve -follow clinical response  Encephalopathy acute -Due to infectious etiology and use of narcotics at home; ? If h/o dementia. -Mentation continue improving -continue minimizing/holding narcotics and will continue constant reorientation and treating infection  Chronic pain syndrome -Stable and currently patient describing intermittent pain on her legs -will continue minimizing/holding narcotics.  Chronic Diastolic dysfunction -will continue checking daily weights and I's and O's -patient w/o crackles, JVD or LE edema -compensated -continue low sodium diet  Essential hypertension -overall stable -will continue home meds  Mild elevation in troponins: - no CP or SOB, no EKG changes suggesting ischemia. -will monitor for now -most likely mild demand ischemia with ongoing PNA and anemia -2-D echo with preserved EF, grade 2 diastolic heart failure and no wall motion abnormalies  Anemia: iron deficiency -no signs of bleeding -s/p 1 unit of PRBC's and Hgb in 6.4 range -will follow Hgb trend, check FOBT and will transfuse 2 more units of PRBC's  Diarrhea:  -C. Diff neg -continue BID florastor  Code Status: full Family Communication: none Disposition Plan: inpatient and with plans to go back to SNF at discharge   Consultants:  ID (Dr. Tommy Medal)  Procedures:  CXR  2-D echo  Study Conclusions - Left ventricle: The cavity size was normal. There was mild  focal basal hypertrophy of the septum. Systolic function was normal. The estimated ejection fraction was in the range of 55% to 60%. Wall motion was normal; there were no regional wall motion abnormalities. Features are consistent with a pseudonormal left ventricular filling pattern, with concomitant abnormal relaxation and increased filling pressure (grade 2 diastolic dysfunction). - Aortic valve: There was mild stenosis. Valve area (VTI): 1.6 cm^2. Valve area (Vmax): 1.64 cm^2. - Mitral valve: There was moderate regurgitation. - Left atrium: The atrium was moderately dilated. - Atrial septum: There was a possible patent foramen ovale. - Tricuspid valve: There was moderate regurgitation. - Pulmonary arteries: Systolic pressure was severely increased. PA peak pressure: 86 mm Hg (S).  Impressions:  - Normal LV function; grade 2 diastolic dysfunction; moderate LAE; calcified aortic valve with mild AS; moderate MR; moderate TR with severely elevated pulmonary pressure.    Antibiotics:  Vanc and cefepime 1.10.16>> 1.14.16  Cefazolin   HPI/Subjective: No fever, no CP. Patient coughing less and w/o further episodes of hemoptysis.   Objective: Filed Vitals:   10/24/14 1433 10/24/14 1503 10/24/14 2300 10/25/14 0655  BP:  153/55 128/67 128/67  Pulse:  85 92 92  Temp:  99.7 F (37.6 C) 98.8 F (37.1 C) 98.8 F (37.1 C)  TempSrc:  Oral Oral Oral  Resp:  18 20 20   Height:      Weight:      SpO2: 97% 100% 100% 100%    Intake/Output Summary (Last 24 hours) at 10/25/14 0858 Last data filed at 10/25/14 0655  Gross per 24 hour  Intake 1612.18 ml  Output    750 ml  Net 862.18 ml   Filed Weights   10/22/14 1515 10/23/14 0543 10/24/14 0545  Weight: 64.5 kg (142  lb 3.2 oz) 63.1 kg (139 lb 1.8 oz) 60.8 kg (134 lb 0.6 oz)    Exam:  General: Afebriel, in no acute distress. No fever and feeling better overall. Complaining of bilat lower ext pain HEENT: No  bruits, no goiter. No JVD Heart: Regular rate and rhythm; no rubs or gallops Lungs: continue improving her breathing; still with positive rhonchi and minimal end exp wheezing; coarse BS Abdomen: Soft, nontender, nondistended, positive bowel sounds.   Data Reviewed: Basic Metabolic Panel:  Recent Labs Lab 10/19/14 1843 10/24/14 0435  NA 137 138  K 4.3 3.8  CL 108 104  CO2 25 27  GLUCOSE 94 92  BUN 13 17  CREATININE 0.78 0.69  CALCIUM 7.9* 7.7*   Liver Function Tests:  Recent Labs Lab 10/19/14 1843  AST 73*  ALT 20  ALKPHOS 139*  BILITOT 0.7  PROT 8.6*  ALBUMIN 3.2*   CBC:  Recent Labs Lab 10/19/14 1959 10/22/14 0420 10/24/14 0435 10/24/14 0702 10/24/14 1515 10/25/14 0536  WBC 10.8* 9.9 12.9*  --   --  14.2*  NEUTROABS 7.8*  --   --   --   --   --   HGB 9.7* 7.5* 6.8* 6.8* 7.6* 6.4*  HCT 32.5* 24.8* 21.6* 21.7* 24.2* 19.8*  MCV 96.2 95.0 94.3  --   --  87.6  PLT 214 158 176  --   --  177   Cardiac Enzymes:  Recent Labs Lab 10/19/14 1843 10/19/14 1959  TROPONINI 0.06* 0.06*   CBG: No results for input(s): GLUCAP in the last 168 hours.  Recent Results (from the past 240 hour(s))  Urine culture     Status: None   Collection Time: 10/19/14  6:16 PM  Result Value Ref Range Status   Specimen Description URINE, CATHETERIZED  Final   Special Requests NONE  Final   Colony Count NO GROWTH Performed at [facility]   Final   Culture NO GROWTH Performed at [facility]   Final   Report Status 10/20/2014 FINAL  Final  Culture, blood (routine x 2)     Status: None   Collection Time: 10/19/14  7:01 PM  Result Value Ref Range Status   Specimen Description BLOOD RIGHT ANTECUBITAL  Final   Special Requests BOTTLES DRAWN AEROBIC AND ANAEROBIC 3CC  Final   Culture   Final    STAPHYLOCOCCUS SPECIES (COAGULASE NEGATIVE) Note: SUSCEPTIBILITIES PERFORMED ON PREVIOUS CULTURE WITHIN THE LAST 5 DAYS. Note: Gram Stain Report Called to,Read Back By  and Verified With: SHANICE VENABLE ON 10/20/2014 AT 11:33P BY WILEJ Performed at [facility]    Report Status 10/23/2014 FINAL  Final  Culture, blood (routine x 2)     Status: None   Collection Time: 10/19/14  7:02 PM  Result Value Ref Range Status   Specimen Description BLOOD RIGHT HAND  Final   Special Requests BOTTLES DRAWN AEROBIC AND ANAEROBIC 4CC  Final   Culture   Final    STAPHYLOCOCCUS SPECIES (COAGULASE NEGATIVE) Note: RIFAMPIN AND GENTAMICIN SHOULD NOT BE USED AS SINGLE DRUGS FOR TREATMENT OF STAPH INFECTIONS. Note: Gram Stain Report Called to,Read Back By and Verified With: SHANICE VENABLE ON 10/20/2014 AT 11:33P BY WILEJ Performed at [facility]    Report Status 10/23/2014 FINAL  Final   Organism ID, Bacteria STAPHYLOCOCCUS SPECIES (COAGULASE NEGATIVE)  Final      Susceptibility   Staphylococcus species (coagulase negative) - MIC*    CLINDAMYCIN <=0.25 SENSITIVE Sensitive  ERYTHROMYCIN <=0.25 SENSITIVE Sensitive     GENTAMICIN <=0.5 SENSITIVE Sensitive     LEVOFLOXACIN 4 INTERMEDIATE Intermediate     OXACILLIN <=0.25 SENSITIVE Sensitive     PENICILLIN >=0.5 RESISTANT Resistant     RIFAMPIN <=0.5 SENSITIVE Sensitive     TRIMETH/SULFA <=10 SENSITIVE Sensitive     VANCOMYCIN <=0.5 SENSITIVE Sensitive     TETRACYCLINE <=1 SENSITIVE Sensitive     * STAPHYLOCOCCUS SPECIES (COAGULASE NEGATIVE)  Gram stain     Status: None   Collection Time: 10/23/14  6:19 AM  Result Value Ref Range Status   Specimen Description TRACHEAL SITE  Final   Special Requests NONE  Final   Gram Stain   Final    ABUNDANT WBC PRESENT, PREDOMINANTLY PMN MODERATE SQUAMOUS EPITHELIAL CELLS PRESENT RARE YEAST Performed at [facility]    Report Status 10/23/2014 FINAL  Final  Clostridium Difficile by PCR     Status: None   Collection Time: 10/24/14  4:24 PM  Result Value Ref Range Status   C difficile by pcr NEGATIVE NEGATIVE Final    Comment: Performed at [facility]     Studies: No results found.  Scheduled Meds: . sodium chloride   Intravenous Once  . budesonide (PULMICORT) nebulizer solution  0.25 mg Nebulization BID  .  ceFAZolin (ANCEF) IV  1 g Intravenous Q8H  . feeding supplement (ENSURE COMPLETE)  237 mL Oral BID BM  . fentaNYL  25 mcg Transdermal Q72H  . ferrous sulfate  325 mg Oral TID WC  . furosemide  40 mg Intravenous Once  . gabapentin  600 mg Oral TID  . guaiFENesin  600 mg Oral BID  . ipratropium-albuterol  3 mL Nebulization TID  . pantoprazole  40 mg Oral Daily  . saccharomyces boulardii  250 mg Oral BID  . sertraline  75 mg Oral Daily  . sodium chloride  3 mL Intravenous Q12H   Continuous Infusions:   Time: 30 minutes  Barton Dubois  [facility] Pager [phone]. If 8PM-8AM, please contact night-coverage at [email or web address], password [facility] 10/25/2014, 8:58 AM  LOS: 6 days

## 2014-10-26 DIAGNOSIS — R195 Other fecal abnormalities: Secondary | ICD-10-CM

## 2014-10-26 DIAGNOSIS — R7881 Bacteremia: Secondary | ICD-10-CM | POA: Diagnosis present

## 2014-10-26 LAB — TYPE AND SCREEN
ABO/RH(D): O POS
Antibody Screen: NEGATIVE
Unit division: 0
Unit division: 0
Unit division: 0

## 2014-10-26 LAB — CBC
HEMATOCRIT: 27.3 % — AB (ref 36.0–46.0)
HEMOGLOBIN: 8.8 g/dL — AB (ref 12.0–15.0)
MCH: 28.1 pg (ref 26.0–34.0)
MCHC: 32.2 g/dL (ref 30.0–36.0)
MCV: 87.2 fL (ref 78.0–100.0)
PLATELETS: 207 10*3/uL (ref 150–400)
RBC: 3.13 MIL/uL — ABNORMAL LOW (ref 3.87–5.11)
RDW: 17.9 % — ABNORMAL HIGH (ref 11.5–15.5)
WBC: 11.2 10*3/uL — ABNORMAL HIGH (ref 4.0–10.5)

## 2014-10-26 MED ORDER — HYDROCODONE-ACETAMINOPHEN 10-325 MG PO TABS
1.0000 | ORAL_TABLET | Freq: Four times a day (QID) | ORAL | Status: DC | PRN
  Administered 2014-10-26: 1 via ORAL
  Filled 2014-10-26: qty 1

## 2014-10-26 MED ORDER — PANTOPRAZOLE SODIUM 40 MG PO TBEC
40.0000 mg | DELAYED_RELEASE_TABLET | Freq: Two times a day (BID) | ORAL | Status: DC
  Administered 2014-10-26 – 2014-11-05 (×19): 40 mg via ORAL
  Filled 2014-10-26 (×22): qty 1

## 2014-10-26 NOTE — Progress Notes (Signed)
[facility] PROGRESS NOTE  Assessment/Plan: HCAP (healthcare-associated pneumonia) and staph areus coagulase negative Bacteremia 2/2 -no further fever and normal WBC's now -discussed with ID and recommendations includes 2-D echo, repeat Blood cx's and treat with cefazolin IV for 4 weeks; if abnormalities appreciated on Echo will need 6 weeks of treatment -holding on picc line until second set of blood cx's neg (preliminary) -will continue pulmicort and duonebs -continue use of mucinex and flutter valve -follow clinical response  Encephalopathy acute -Due to infectious etiology and use of narcotics at home; ? If h/o dementia. -Mentation continue improving -continue minimizing/holding narcotics and will continue constant reorientation and treating infection  Chronic pain syndrome -Stable and currently patient describing intermittent pain on her legs -will continue minimizing/holding narcotics.  Chronic Diastolic dysfunction -will continue checking daily weights and I's and O's -patient w/o crackles, JVD or LE edema -compensated -continue low sodium diet  Essential hypertension -overall stable -will continue home meds  Mild elevation in troponins: - no CP or SOB, no EKG changes suggesting ischemia. -will monitor for now -most likely mild demand ischemia with ongoing PNA and anemia -2-D echo with preserved EF, grade 2 diastolic heart failure and no wall motion abnormalies  Anemia: iron deficiency -no signs of bleeding -s/p 1 unit of PRBC's and Hgb in 6.4 range -will follow Hgb trend -FOBT positive  -patient reprots positive FOBT in the past and has normal colonoscopy -will increase PPI to BID -will discuss with GI; but patient w/o N/V, hematemesis, hematochezia or melena.  Diarrhea:  -C. Diff neg -continue BID florastor  Code Status: full Family Communication: none Disposition Plan: inpatient and with plans to go back to SNF at  discharge   Consultants:  ID (Dr. Tommy Medal)  Procedures:  CXR  2-D echo  Study Conclusions - Left ventricle: The cavity size was normal. There was mild focal basal hypertrophy of the septum. Systolic function was normal. The estimated ejection fraction was in the range of 55% to 60%. Wall motion was normal; there were no regional wall motion abnormalities. Features are consistent with a pseudonormal left ventricular filling pattern, with concomitant abnormal relaxation and increased filling pressure (grade 2 diastolic dysfunction). - Aortic valve: There was mild stenosis. Valve area (VTI): 1.6 cm^2. Valve area (Vmax): 1.64 cm^2. - Mitral valve: There was moderate regurgitation. - Left atrium: The atrium was moderately dilated. - Atrial septum: There was a possible patent foramen ovale. - Tricuspid valve: There was moderate regurgitation. - Pulmonary arteries: Systolic pressure was severely increased. PA peak pressure: 86 mm Hg (S).  Impressions:  - Normal LV function; grade 2 diastolic dysfunction; moderate LAE; calcified aortic valve with mild AS; moderate MR; moderate TR with severely elevated pulmonary pressure.    Antibiotics:  Vanc and cefepime 1.10.16>> 1.14.16  Cefazolin   HPI/Subjective: No fever, no CP. Patient breathing continue improving. Second set of blood cx's are w/o growth up to date.  Objective: Filed Vitals:   10/26/14 0515 10/26/14 0737 10/26/14 0742 10/26/14 1402  BP: 130/60   133/70  Pulse: 69   76  Temp: 99.1 F (37.3 C)   99.3 F (37.4 C)  TempSrc: Oral   Oral  Resp: 16   16  Height:      Weight:      SpO2: 98% 100% 100% 100%    Intake/Output Summary (Last 24 hours) at 10/26/14 1654 Last data filed at 10/26/14 1403  Gross per 24 hour  Intake  797.5 ml  Output  400 ml  Net  397.5 ml   Filed Weights   10/24/14 0545 10/25/14 1431 10/26/14 0500  Weight: 60.8 kg (134 lb 0.6 oz) 65.8 kg (145 lb 1 oz) 62.6 kg  (138 lb 0.1 oz)    Exam:  General: Afebrile, in no acute distress. Complaining of bilat lower ext pain. No melena or hematochezia HEENT: No bruits, no goiter. No JVD Heart: Regular rate and rhythm; no rubs or gallops Lungs: continue improving her breathing; still with positive rhonchi; ut not wheezing; coarse BS appreciated Abdomen: Soft, nontender, nondistended, positive bowel sounds.   Data Reviewed: Basic Metabolic Panel:  Recent Labs Lab 10/19/14 1843 10/24/14 0435  NA 137 138  K 4.3 3.8  CL 108 104  CO2 25 27  GLUCOSE 94 92  BUN 13 17  CREATININE 0.78 0.69  CALCIUM 7.9* 7.7*   Liver Function Tests:  Recent Labs Lab 10/19/14 1843  AST 73*  ALT 20  ALKPHOS 139*  BILITOT 0.7  PROT 8.6*  ALBUMIN 3.2*   CBC:  Recent Labs Lab 10/19/14 1959 10/22/14 0420 10/24/14 0435 10/24/14 0702 10/24/14 1515 10/25/14 0536 10/26/14 1000  WBC 10.8* 9.9 12.9*  --   --  14.2* 11.2*  NEUTROABS 7.8*  --   --   --   --   --   --   HGB 9.7* 7.5* 6.8* 6.8* 7.6* 6.4* 8.8*  HCT 32.5* 24.8* 21.6* 21.7* 24.2* 19.8* 27.3*  MCV 96.2 95.0 94.3  --   --  87.6 87.2  PLT 214 158 176  --   --  177 207   Cardiac Enzymes:  Recent Labs Lab 10/19/14 1843 10/19/14 1959  TROPONINI 0.06* 0.06*   CBG: No results for input(s): GLUCAP in the last 168 hours.  Recent Results (from the past 240 hour(s))  Urine culture     Status: None   Collection Time: 10/19/14  6:16 PM  Result Value Ref Range Status   Specimen Description URINE, CATHETERIZED  Final   Special Requests NONE  Final   Colony Count NO GROWTH Performed at [facility]   Final   Culture NO GROWTH Performed at [facility]   Final   Report Status 10/20/2014 FINAL  Final  Culture, blood (routine x 2)     Status: None   Collection Time: 10/19/14  7:01 PM  Result Value Ref Range Status   Specimen Description BLOOD RIGHT ANTECUBITAL  Final   Special Requests BOTTLES DRAWN AEROBIC AND ANAEROBIC 3CC  Final    Culture   Final    STAPHYLOCOCCUS SPECIES (COAGULASE NEGATIVE) Note: SUSCEPTIBILITIES PERFORMED ON PREVIOUS CULTURE WITHIN THE LAST 5 DAYS. Note: Gram Stain Report Called to,Read Back By and Verified With: SHANICE VENABLE ON 10/20/2014 AT 11:33P BY WILEJ Performed at [facility]    Report Status 10/23/2014 FINAL  Final  Culture, blood (routine x 2)     Status: None   Collection Time: 10/19/14  7:02 PM  Result Value Ref Range Status   Specimen Description BLOOD RIGHT HAND  Final   Special Requests BOTTLES DRAWN AEROBIC AND ANAEROBIC 4CC  Final   Culture   Final    STAPHYLOCOCCUS SPECIES (COAGULASE NEGATIVE) Note: RIFAMPIN AND GENTAMICIN SHOULD NOT BE USED AS SINGLE DRUGS FOR TREATMENT OF STAPH INFECTIONS. Note: Gram Stain Report Called to,Read Back By and Verified With: SHANICE VENABLE ON 10/20/2014 AT 11:33P BY Dennard Nip Performed at [facility]    Report Status 10/23/2014 FINAL  Final  Organism ID, Bacteria STAPHYLOCOCCUS SPECIES (COAGULASE NEGATIVE)  Final      Susceptibility   Staphylococcus species (coagulase negative) - MIC*    CLINDAMYCIN <=0.25 SENSITIVE Sensitive     ERYTHROMYCIN <=0.25 SENSITIVE Sensitive     GENTAMICIN <=0.5 SENSITIVE Sensitive     LEVOFLOXACIN 4 INTERMEDIATE Intermediate     OXACILLIN <=0.25 SENSITIVE Sensitive     PENICILLIN >=0.5 RESISTANT Resistant     RIFAMPIN <=0.5 SENSITIVE Sensitive     TRIMETH/SULFA <=10 SENSITIVE Sensitive     VANCOMYCIN <=0.5 SENSITIVE Sensitive     TETRACYCLINE <=1 SENSITIVE Sensitive     * STAPHYLOCOCCUS SPECIES (COAGULASE NEGATIVE)  Gram stain     Status: None   Collection Time: 10/23/14  6:19 AM  Result Value Ref Range Status   Specimen Description TRACHEAL SITE  Final   Special Requests NONE  Final   Gram Stain   Final    ABUNDANT WBC PRESENT, PREDOMINANTLY PMN MODERATE SQUAMOUS EPITHELIAL CELLS PRESENT RARE YEAST Performed at [facility]    Report Status 10/23/2014 FINAL  Final  Culture,  blood (routine x 2)     Status: None (Preliminary result)   Collection Time: 10/23/14  5:15 PM  Result Value Ref Range Status   Specimen Description BLOOD LEFT HAND  Final   Special Requests BOTTLES DRAWN AEROBIC ONLY  3 CC  Final   Culture   Final           BLOOD CULTURE RECEIVED NO GROWTH TO DATE CULTURE WILL BE HELD FOR 5 DAYS BEFORE ISSUING A FINAL NEGATIVE REPORT Performed at [facility]    Report Status PENDING  Incomplete  Culture, blood (routine x 2)     Status: None (Preliminary result)   Collection Time: 10/23/14  5:19 PM  Result Value Ref Range Status   Specimen Description BLOOD RIGHT HAND  Final   Special Requests BOTTLES DRAWN AEROBIC ONLY 3 CC  Final   Culture   Final           BLOOD CULTURE RECEIVED NO GROWTH TO DATE CULTURE WILL BE HELD FOR 5 DAYS BEFORE ISSUING A FINAL NEGATIVE REPORT Performed at [facility]    Report Status PENDING  Incomplete  Clostridium Difficile by PCR     Status: None   Collection Time: 10/24/14  4:24 PM  Result Value Ref Range Status   C difficile by pcr NEGATIVE NEGATIVE Final    Comment: Performed at [facility]     Studies: No results found.  Scheduled Meds: . sodium chloride   Intravenous Once  . budesonide (PULMICORT) nebulizer solution  0.25 mg Nebulization BID  .  ceFAZolin (ANCEF) IV  1 g Intravenous Q8H  . feeding supplement (ENSURE COMPLETE)  237 mL Oral BID BM  . fentaNYL  25 mcg Transdermal Q72H  . ferrous sulfate  325 mg Oral TID WC  . gabapentin  600 mg Oral TID  . guaiFENesin  600 mg Oral BID  . ipratropium-albuterol  3 mL Nebulization TID  . pantoprazole  40 mg Oral BID  . saccharomyces boulardii  250 mg Oral BID  . sertraline  75 mg Oral Daily  . sodium chloride  3 mL Intravenous Q12H   Continuous Infusions:   Time: 30 minutes  Barton Dubois  [facility] Pager [phone]. If 8PM-8AM, please contact night-coverage at [email or web address], password [facility] 10/26/2014, 4:54 PM  LOS: 7  days

## 2014-10-26 NOTE — Progress Notes (Signed)
ANTIBIOTIC CONSULT NOTE - FOLLOW UP  Pharmacy Consult for Cefazolin Indication: Bacteremia  Allergies  Allergen Reactions  . Codeine     Hallucinations  . Ibuprofen     Confusion    Patient Measurements: Height: 5\' 3"  (160 cm) Weight: 138 lb 0.1 oz (62.6 kg) IBW/kg (Calculated) : 52.4  Vital Signs: Temp: 99.1 F (37.3 C) (01/17 0515) Temp Source: Oral (01/17 0515) BP: 130/60 mmHg (01/17 0515) Pulse Rate: 69 (01/17 0515) Intake/Output from previous day: 01/16 0701 - 01/17 0700 In: 1410.8 [P.O.:720; Blood:690.8] Out: 300 [Urine:300] Intake/Output from this shift:    Labs:  Recent Labs  10/24/14 0435 10/24/14 0702 10/24/14 1515 10/25/14 0536  WBC 12.9*  --   --  14.2*  HGB 6.8* 6.8* 7.6* 6.4*  PLT 176  --   --  177  CREATININE 0.69  --   --   --    Estimated Creatinine Clearance: 41 mL/min (by C-G formula based on Cr of 0.69). No results for input(s): VANCOTROUGH, VANCOPEAK, VANCORANDOM, GENTTROUGH, GENTPEAK, GENTRANDOM, TOBRATROUGH, TOBRAPEAK, TOBRARND, AMIKACINPEAK, AMIKACINTROU, AMIKACIN in the last 72 hours.   Microbiology: Recent Results (from the past 720 hour(s))  Urine culture     Status: None   Collection Time: 10/19/14  6:16 PM  Result Value Ref Range Status   Specimen Description URINE, CATHETERIZED  Final   Special Requests NONE  Final   Colony Count NO GROWTH Performed at [facility]   Final   Culture NO GROWTH Performed at [facility]   Final   Report Status 10/20/2014 FINAL  Final  Culture, blood (routine x 2)     Status: None   Collection Time: 10/19/14  7:01 PM  Result Value Ref Range Status   Specimen Description BLOOD RIGHT ANTECUBITAL  Final   Special Requests BOTTLES DRAWN AEROBIC AND ANAEROBIC 3CC  Final   Culture   Final    STAPHYLOCOCCUS SPECIES (COAGULASE NEGATIVE) Note: SUSCEPTIBILITIES PERFORMED ON PREVIOUS CULTURE WITHIN THE LAST 5 DAYS. Note: Gram Stain Report Called to,Read Back By and Verified With:  SHANICE VENABLE ON 10/20/2014 AT 11:33P BY WILEJ Performed at [facility]    Report Status 10/23/2014 FINAL  Final  Culture, blood (routine x 2)     Status: None   Collection Time: 10/19/14  7:02 PM  Result Value Ref Range Status   Specimen Description BLOOD RIGHT HAND  Final   Special Requests BOTTLES DRAWN AEROBIC AND ANAEROBIC 4CC  Final   Culture   Final    STAPHYLOCOCCUS SPECIES (COAGULASE NEGATIVE) Note: RIFAMPIN AND GENTAMICIN SHOULD NOT BE USED AS SINGLE DRUGS FOR TREATMENT OF STAPH INFECTIONS. Note: Gram Stain Report Called to,Read Back By and Verified With: SHANICE VENABLE ON 10/20/2014 AT 11:33P BY WILEJ Performed at [facility]    Report Status 10/23/2014 FINAL  Final   Organism ID, Bacteria STAPHYLOCOCCUS SPECIES (COAGULASE NEGATIVE)  Final      Susceptibility   Staphylococcus species (coagulase negative) - MIC*    CLINDAMYCIN <=0.25 SENSITIVE Sensitive     ERYTHROMYCIN <=0.25 SENSITIVE Sensitive     GENTAMICIN <=0.5 SENSITIVE Sensitive     LEVOFLOXACIN 4 INTERMEDIATE Intermediate     OXACILLIN <=0.25 SENSITIVE Sensitive     PENICILLIN >=0.5 RESISTANT Resistant     RIFAMPIN <=0.5 SENSITIVE Sensitive     TRIMETH/SULFA <=10 SENSITIVE Sensitive     VANCOMYCIN <=0.5 SENSITIVE Sensitive     TETRACYCLINE <=1 SENSITIVE Sensitive     * STAPHYLOCOCCUS SPECIES (COAGULASE NEGATIVE)  Gram stain     Status: None   Collection Time: 10/23/14  6:19 AM  Result Value Ref Range Status   Specimen Description TRACHEAL SITE  Final   Special Requests NONE  Final   Gram Stain   Final    ABUNDANT WBC PRESENT, PREDOMINANTLY PMN MODERATE SQUAMOUS EPITHELIAL CELLS PRESENT RARE YEAST Performed at [facility]    Report Status 10/23/2014 FINAL  Final  Culture, blood (routine x 2)     Status: None (Preliminary result)   Collection Time: 10/23/14  5:15 PM  Result Value Ref Range Status   Specimen Description BLOOD LEFT HAND  Final   Special Requests BOTTLES DRAWN  AEROBIC ONLY  3 CC  Final   Culture   Final           BLOOD CULTURE RECEIVED NO GROWTH TO DATE CULTURE WILL BE HELD FOR 5 DAYS BEFORE ISSUING A FINAL NEGATIVE REPORT Performed at [facility]    Report Status PENDING  Incomplete  Culture, blood (routine x 2)     Status: None (Preliminary result)   Collection Time: 10/23/14  5:19 PM  Result Value Ref Range Status   Specimen Description BLOOD RIGHT HAND  Final   Special Requests BOTTLES DRAWN AEROBIC ONLY 3 CC  Final   Culture   Final           BLOOD CULTURE RECEIVED NO GROWTH TO DATE CULTURE WILL BE HELD FOR 5 DAYS BEFORE ISSUING A FINAL NEGATIVE REPORT Performed at [facility]    Report Status PENDING  Incomplete  Clostridium Difficile by PCR     Status: None   Collection Time: 10/24/14  4:24 PM  Result Value Ref Range Status   C difficile by pcr NEGATIVE NEGATIVE Final    Comment: Performed at [facility]   Assessment: 81yo F from NH with fever, cough, lethergy, and AMS. Single-doses of Cefepime and Azithromycin are ordered in the ED. Pharmacy initially consulted to dose Vanc for HCAP then narrowed to Ancef for coag negative Staph bacteremia per ID recommendations.  Also recommend 2-D echo, repeat Blood cx's and treat with cefazolin IV for 4 weeks; if abnormalities appreciated on Echo will need 6 weeks of treatment -holding on picc line until second set of blood cx's neg (preliminary negative to date).  1/10 >> Azithro x1 1/10 >> Cefepime x1, restarted 1/11 > 1/13 1/10 >> Vanc >> 1/14 1/11 >> Aztreonam >> 1/11 1/14 >> Ancef >>  Today, 10/26/2014, Day #8 ABX, #4 Ancef Tmax: 99.3 WBCs: 14.2, rising Renal: SCr 0.69, CrCl 41 ml/min CG  1/10 blood: 2/2 coag neg staph 1/10 urine: NGF 1/11 strep UAg: negative 1/11 Legionella UAg: negative 1/11 Strep UAg: negative 1/11 flu panel: not detected 1/14 tracheal site: abundant WBC, rare yeast 1/14 blood: ngtd 1/15 Cdiff: negative  Goal of Therapy:   Appropriate antibiotic dosing for renal function; eradication of infection  Plan:   Continue  Ancef 1g IV q8h (renal adjustment of usual 2g q8h dose)   Monitor renal function, repeat blood cultures   Peggyann Juba, PharmD, BCPS Pager: [phone] 10/26/2014,11:58 AM.

## 2014-10-27 ENCOUNTER — Inpatient Hospital Stay (HOSPITAL_COMMUNITY): Payer: Medicare Other

## 2014-10-27 MED ORDER — ALBUTEROL SULFATE (2.5 MG/3ML) 0.083% IN NEBU: 2.5000 mg | INHALATION_SOLUTION | Freq: Four times a day (QID) | RESPIRATORY_TRACT | Status: DC | PRN

## 2014-10-27 MED ORDER — HYDROCODONE-ACETAMINOPHEN 10-325 MG PO TABS
1.0000 | ORAL_TABLET | Freq: Three times a day (TID) | ORAL | Status: DC | PRN
  Administered 2014-10-28: 1 via ORAL
  Filled 2014-10-27: qty 1

## 2014-10-27 MED ORDER — GABAPENTIN 300 MG PO CAPS
300.0000 mg | ORAL_CAPSULE | Freq: Three times a day (TID) | ORAL | Status: DC
  Administered 2014-10-27 – 2014-11-01 (×10): 300 mg via ORAL
  Filled 2014-10-27 (×20): qty 1

## 2014-10-27 NOTE — Clinical Social Work Note (Signed)
Patient admitted from [facility] and is planning for return at dc. Updated SNF rep today and will await further word from MD on dc date-  Eduard Clos, MSW, [place]

## 2014-10-27 NOTE — Progress Notes (Signed)
Physical Therapy Treatment Patient Details Name: [name] MRN: [id number] DOB: June 20, 1925 Today's Date: 10/27/2014    History of Present Illness 81 yo female admitted with Pna, AMS, fever. Hx of t11-T12 laminotomy-2014, HTn, CVA, spinal stenosis, atrial tachycardia, chroni pain-opiods.     PT Comments    Pt assisted to bed side sitting and assisted with balance while CNA attended to hygiene.  Pt assisted bed<>chair max assist with pt min WB on LEs.  Pt indicating L foot pain - RN aware  Follow Up Recommendations  SNF     Equipment Recommendations  None recommended by PT    Recommendations for Other Services       Precautions / Restrictions Precautions Precautions: Fall Restrictions Weight Bearing Restrictions: No    Mobility  Bed Mobility Overal bed mobility: Needs Assistance Bed Mobility: Supine to Sit;Sit to Supine     Supine to sit: Min assist;Mod assist Sit to supine: Mod assist   General bed mobility comments: cues for sequencing.  Pad utilized to assist pt to EOB  Transfers Overall transfer level: Needs assistance Equipment used: Rolling walker (2 wheeled) Transfers: Sit to/from [facility] Sit to Stand: Max assist Stand pivot transfers: Total assist       General transfer comment: Pt unable to attain standing with RW - throwing feet out in front vs pushing up to stand.  Transferred stand/pvt bed to chair with pt assisting minimally.   Once pt in chair, Xray transport arrived and pt tranferred back to bed max assist  Ambulation/Gait Ambulation/Gait assistance:  (Pt unable to stand to allow ambulation to be attempted)               Science writer    Modified Rankin (Stroke Patients Only)       Balance Overall balance assessment: Needs assistance Sitting-balance support: Bilateral upper extremity supported;Feet supported Sitting balance-Leahy Scale: Fair       Standing balance-Leahy Scale:  Zero                      Cognition Arousal/Alertness: Awake/alert Behavior During Therapy: Flat affect Overall Cognitive Status: History of cognitive impairments - at baseline                      Exercises      General Comments        Pertinent Vitals/Pain Pain Assessment: Faces Faces Pain Scale: Hurts even more Pain Location: L foot Pain Intervention(s): Limited activity within patient's tolerance;Other (comment) (RN advised of pt c/p L foot pain)    Home Living                      Prior Function            PT Goals (current goals can now be found in the care plan section) Acute Rehab PT Goals Patient Stated Goal: none stated PT Goal Formulation: Patient unable to participate in goal setting Time For Goal Achievement: 11/04/14 Potential to Achieve Goals: Fair Progress towards PT goals: Not progressing toward goals - comment (Not WB on LEs with attempts to stand)    Frequency  Min 2X/week    PT Plan Current plan remains appropriate    Co-evaluation             End of Session Equipment Utilized During Treatment: Gait belt Activity Tolerance: Patient limited by fatigue;Other (  comment) Patient left: in bed;with call bell/phone within reach;with nursing/sitter in room     Time: 0812-0839 PT Time Calculation (min) (ACUTE ONLY): 27 min  Charges:  $Therapeutic Activity: 23-37 mins                    G Codes:      Livier Hendel 17-Nov-2014, 1:05 PM

## 2014-10-27 NOTE — Progress Notes (Signed)
[facility] PROGRESS NOTE  Assessment/Plan: HCAP (healthcare-associated pneumonia) and staph areus coagulase negative Bacteremia 2/2 -patient spike fever overnight; will repeat CXR and will hold on placing PICC until no fever for 24 hours. -discussed with ID and recommendations includes 2-D echo, repeat Blood cx's and treat with cefazolin IV for 4 weeks (day 3/28) -second set of blood cx's neg (preliminary in first 24 hours) -will continue pulmicort and duonebs -continue use of mucinex and flutter valve -follow clinical response  Encephalopathy acute -Due to infectious etiology and use of narcotics at home; ? If h/o dementia. -Mentation continue improving -continue minimizing/holding narcotics and will continue constant reorientation and treating infection  Chronic pain syndrome -Stable and currently patient describing intermittent pain on her legs -will continue minimizing/holding narcotics.  Chronic Diastolic dysfunction -will continue checking daily weights and I's and O's -patient w/o crackles, JVD or LE edema -compensated -continue low sodium diet  Essential hypertension -overall stable -will continue home meds  Mild elevation in troponins: - no CP or SOB, no EKG changes suggesting ischemia. -will monitor for now -most likely mild demand ischemia with ongoing PNA and anemia -2-D echo with preserved EF, grade 2 diastolic heart failure and no wall motion abnormalies  Anemia: iron deficiency -no signs of bleeding -s/p 1 unit of PRBC's and Hgb in 6.4 range -will follow Hgb trend -FOBT positive  -patient reprots positive FOBT in the past and has normal colonoscopy -will increase PPI to BID -will discuss with GI; but patient w/o N/V, hematemesis, hematochezia or melena.  Diarrhea:  -C. Diff neg -continue BID florastor  Code Status: full Family Communication: none Disposition Plan: inpatient and with plans to go back to SNF at  discharge   Consultants:  ID (Dr. Tommy Medal)  Procedures:  CXR  2-D echo  Study Conclusions - Left ventricle: The cavity size was normal. There was mild focal basal hypertrophy of the septum. Systolic function was normal. The estimated ejection fraction was in the range of 55% to 60%. Wall motion was normal; there were no regional wall motion abnormalities. Features are consistent with a pseudonormal left ventricular filling pattern, with concomitant abnormal relaxation and increased filling pressure (grade 2 diastolic dysfunction). - Aortic valve: There was mild stenosis. Valve area (VTI): 1.6 cm^2. Valve area (Vmax): 1.64 cm^2. - Mitral valve: There was moderate regurgitation. - Left atrium: The atrium was moderately dilated. - Atrial septum: There was a possible patent foramen ovale. - Tricuspid valve: There was moderate regurgitation. - Pulmonary arteries: Systolic pressure was severely increased. PA peak pressure: 86 mm Hg (S).  Impressions:  - Normal LV function; grade 2 diastolic dysfunction; moderate LAE; calcified aortic valve with mild AS; moderate MR; moderate TR with severely elevated pulmonary pressure.    Antibiotics:  Vanc and cefepime 1.10.16>> 1.14.16  Cefazolin   HPI/Subjective: No CP. Patient breathing continue improving. Second set of blood cx's are w/o growth up to date. Positive fever overnight.  Objective: Filed Vitals:   10/27/14 0512 10/27/14 0900 10/27/14 1333 10/27/14 1446  BP: 152/95  144/86   Pulse: 87  83   Temp: 101.6 F (38.7 C)  100.1 F (37.8 C)   TempSrc: Oral  Oral   Resp: 20  18   Height:      Weight:      SpO2: 97% 97% 96% 94%    Intake/Output Summary (Last 24 hours) at 10/27/14 1852 Last data filed at 10/27/14 1030  Gross per 24 hour  Intake    245  ml  Output    250 ml  Net     -5 ml   Filed Weights   10/24/14 0545 10/25/14 1431 10/26/14 0500  Weight: 60.8 kg (134 lb 0.6 oz) 65.8 kg (145 lb  1 oz) 62.6 kg (138 lb 0.1 oz)    Exam:  General: patient febrile overnight, in no acute distress. Complaining of bilat lower ext pain. No melena or hematochezia HEENT: No bruits, no goiter. No JVD Heart: Regular rate and rhythm; no rubs or gallops Lungs: continue improving her breathing; still with positive rhonchi; not wheezing; coarse BS appreciated Abdomen: Soft, nontender, nondistended, positive bowel sounds.   Data Reviewed: Basic Metabolic Panel:  Recent Labs Lab 10/24/14 0435  NA 138  K 3.8  CL 104  CO2 27  GLUCOSE 92  BUN 17  CREATININE 0.69  CALCIUM 7.7*   Liver Function Tests: No results for input(s): AST, ALT, ALKPHOS, BILITOT, PROT, ALBUMIN in the last 168 hours. CBC:  Recent Labs Lab 10/22/14 0420 10/24/14 0435 10/24/14 0702 10/24/14 1515 10/25/14 0536 10/26/14 1000  WBC 9.9 12.9*  --   --  14.2* 11.2*  HGB 7.5* 6.8* 6.8* 7.6* 6.4* 8.8*  HCT 24.8* 21.6* 21.7* 24.2* 19.8* 27.3*  MCV 95.0 94.3  --   --  87.6 87.2  PLT 158 176  --   --  177 207    Recent Results (from the past 240 hour(s))  Urine culture     Status: None   Collection Time: 10/19/14  6:16 PM  Result Value Ref Range Status   Specimen Description URINE, CATHETERIZED  Final   Special Requests NONE  Final   Colony Count NO GROWTH Performed at [facility]   Final   Culture NO GROWTH Performed at [facility]   Final   Report Status 10/20/2014 FINAL  Final  Culture, blood (routine x 2)     Status: None   Collection Time: 10/19/14  7:01 PM  Result Value Ref Range Status   Specimen Description BLOOD RIGHT ANTECUBITAL  Final   Special Requests BOTTLES DRAWN AEROBIC AND ANAEROBIC 3CC  Final   Culture   Final    STAPHYLOCOCCUS SPECIES (COAGULASE NEGATIVE) Note: SUSCEPTIBILITIES PERFORMED ON PREVIOUS CULTURE WITHIN THE LAST 5 DAYS. Note: Gram Stain Report Called to,Read Back By and Verified With: SHANICE VENABLE ON 10/20/2014 AT 11:33P BY WILEJ Performed at [facility]    Report Status 10/23/2014 FINAL  Final  Culture, blood (routine x 2)     Status: None   Collection Time: 10/19/14  7:02 PM  Result Value Ref Range Status   Specimen Description BLOOD RIGHT HAND  Final   Special Requests BOTTLES DRAWN AEROBIC AND ANAEROBIC 4CC  Final   Culture   Final    STAPHYLOCOCCUS SPECIES (COAGULASE NEGATIVE) Note: RIFAMPIN AND GENTAMICIN SHOULD NOT BE USED AS SINGLE DRUGS FOR TREATMENT OF STAPH INFECTIONS. Note: Gram Stain Report Called to,Read Back By and Verified With: SHANICE VENABLE ON 10/20/2014 AT 11:33P BY WILEJ Performed at [facility]    Report Status 10/23/2014 FINAL  Final   Organism ID, Bacteria STAPHYLOCOCCUS SPECIES (COAGULASE NEGATIVE)  Final      Susceptibility   Staphylococcus species (coagulase negative) - MIC*    CLINDAMYCIN <=0.25 SENSITIVE Sensitive     ERYTHROMYCIN <=0.25 SENSITIVE Sensitive     GENTAMICIN <=0.5 SENSITIVE Sensitive     LEVOFLOXACIN 4 INTERMEDIATE Intermediate     OXACILLIN <=0.25 SENSITIVE Sensitive  PENICILLIN >=0.5 RESISTANT Resistant     RIFAMPIN <=0.5 SENSITIVE Sensitive     TRIMETH/SULFA <=10 SENSITIVE Sensitive     VANCOMYCIN <=0.5 SENSITIVE Sensitive     TETRACYCLINE <=1 SENSITIVE Sensitive     * STAPHYLOCOCCUS SPECIES (COAGULASE NEGATIVE)  Gram stain     Status: None   Collection Time: 10/23/14  6:19 AM  Result Value Ref Range Status   Specimen Description TRACHEAL SITE  Final   Special Requests NONE  Final   Gram Stain   Final    ABUNDANT WBC PRESENT, PREDOMINANTLY PMN MODERATE SQUAMOUS EPITHELIAL CELLS PRESENT RARE YEAST Performed at [facility]    Report Status 10/23/2014 FINAL  Final  Culture, blood (routine x 2)     Status: None (Preliminary result)   Collection Time: 10/23/14  5:15 PM  Result Value Ref Range Status   Specimen Description BLOOD LEFT HAND  Final   Special Requests BOTTLES DRAWN AEROBIC ONLY  3 CC  Final   Culture   Final           BLOOD CULTURE  RECEIVED NO GROWTH TO DATE CULTURE WILL BE HELD FOR 5 DAYS BEFORE ISSUING A FINAL NEGATIVE REPORT Performed at [facility]    Report Status PENDING  Incomplete  Culture, blood (routine x 2)     Status: None (Preliminary result)   Collection Time: 10/23/14  5:19 PM  Result Value Ref Range Status   Specimen Description BLOOD RIGHT HAND  Final   Special Requests BOTTLES DRAWN AEROBIC ONLY 3 CC  Final   Culture   Final           BLOOD CULTURE RECEIVED NO GROWTH TO DATE CULTURE WILL BE HELD FOR 5 DAYS BEFORE ISSUING A FINAL NEGATIVE REPORT Performed at [facility]    Report Status PENDING  Incomplete  Clostridium Difficile by PCR     Status: None   Collection Time: 10/24/14  4:24 PM  Result Value Ref Range Status   C difficile by pcr NEGATIVE NEGATIVE Final    Comment: Performed at [facility]     Studies: Dg Chest 2 View  10/27/2014   CLINICAL DATA:  Followup pneumonia, cough, congestion  EXAM: CHEST  2 VIEW  COMPARISON:  10/19/2014  FINDINGS: Cardiomediastinal silhouette is stable. Mild hyperinflation. Improvement in aeration without convincing pulmonary edema or segmental infiltrate. Degenerative changes thoracolumbar spine. Degenerative changes bilateral shoulders.  IMPRESSION: Improvement in aeration without pulmonary edema or segmental infiltrate. Mild hyperinflation. Degenerative changes thoracolumbar spine and bilateral shoulders.   Electronically Signed   By: Lahoma Crocker M.D.   On: 10/27/2014 10:34    Scheduled Meds: . sodium chloride   Intravenous Once  . budesonide (PULMICORT) nebulizer solution  0.25 mg Nebulization BID  .  ceFAZolin (ANCEF) IV  1 g Intravenous Q8H  . feeding supplement (ENSURE COMPLETE)  237 mL Oral BID BM  . fentaNYL  25 mcg Transdermal Q72H  . ferrous sulfate  325 mg Oral TID WC  . gabapentin  600 mg Oral TID  . guaiFENesin  600 mg Oral BID  . ipratropium-albuterol  3 mL Nebulization TID  . pantoprazole  40 mg Oral BID  .  saccharomyces boulardii  250 mg Oral BID  . sertraline  75 mg Oral Daily  . sodium chloride  3 mL Intravenous Q12H   Continuous Infusions:   Time: 30 minutes  Barton Dubois  [facility] Pager [phone]. If 8PM-8AM, please contact night-coverage at [email or web address], password  [email or web address] 10/27/2014, 6:52 PM  LOS: 8 days

## 2014-10-28 LAB — CBC
HCT: 26.2 % — ABNORMAL LOW (ref 36.0–46.0)
Hemoglobin: 8.2 g/dL — ABNORMAL LOW (ref 12.0–15.0)
MCH: 27.7 pg (ref 26.0–34.0)
MCHC: 31.3 g/dL (ref 30.0–36.0)
MCV: 88.5 fL (ref 78.0–100.0)
PLATELETS: 330 10*3/uL (ref 150–400)
RBC: 2.96 MIL/uL — ABNORMAL LOW (ref 3.87–5.11)
RDW: 17.4 % — ABNORMAL HIGH (ref 11.5–15.5)
WBC: 18 10*3/uL — AB (ref 4.0–10.5)

## 2014-10-28 LAB — BASIC METABOLIC PANEL
Anion gap: 6 (ref 5–15)
BUN: 20 mg/dL (ref 6–23)
CALCIUM: 7.7 mg/dL — AB (ref 8.4–10.5)
CHLORIDE: 104 meq/L (ref 96–112)
CO2: 27 mmol/L (ref 19–32)
Creatinine, Ser: 0.81 mg/dL (ref 0.50–1.10)
GFR calc Af Amer: 74 mL/min — ABNORMAL LOW (ref >90)
GFR calc non Af Amer: 63 mL/min — ABNORMAL LOW (ref >90)
GLUCOSE: 95 mg/dL (ref 70–99)
Potassium: 3.9 mmol/L (ref 3.5–5.1)
Sodium: 137 mmol/L (ref 135–145)

## 2014-10-28 LAB — URINALYSIS, ROUTINE W REFLEX MICROSCOPIC
BILIRUBIN URINE: NEGATIVE
Glucose, UA: NEGATIVE mg/dL
Hgb urine dipstick: NEGATIVE
Ketones, ur: NEGATIVE mg/dL
LEUKOCYTES UA: NEGATIVE
Nitrite: NEGATIVE
PROTEIN: 100 mg/dL — AB
SPECIFIC GRAVITY, URINE: 1.027 (ref 1.005–1.030)
Urobilinogen, UA: 1 mg/dL (ref 0.0–1.0)
pH: 7 (ref 5.0–8.0)

## 2014-10-28 LAB — URINE MICROSCOPIC-ADD ON

## 2014-10-28 MED ORDER — HYDROCODONE-ACETAMINOPHEN 10-325 MG PO TABS: 1.0000 | ORAL_TABLET | Freq: Three times a day (TID) | ORAL | Status: DC | PRN

## 2014-10-28 NOTE — Progress Notes (Signed)
NUTRITION FOLLOW UP  Intervention:   -Allowed one salt packet per meal to assist with improving food taste/appetite per pt request -Continue Ensure BID -RD to continue to monitor  Nutrition Dx:   Inadequate oral intake related to HCAP and fever as evidenced by poor po intake and poor appetite; ongoing  Goal:   Pt to meet >/= 90% of their estimated nutrition needs; not met  Monitor:   Total protein/energy intake, labs, weights  Assessment:   1/11: Pt with stable wt. She reports a poor appetite and po intake is recorded as 0%. She says that she has only had sips of water with her medications. Pt encouraged to eat small portions. She agreed to try Ensure Complete. Pt with moderate muscle loss at the temples.   1/19: -Pt continues with poor PO intake, consuming ~20% of meals -Dislikes Heart healthy diet restrictions, reported foods have no taste. RD allowed one salt packet/meal on [employer] -Ensure Complete ordered BID. Drinking 1-2 daily. Enjoys taste -Denied nausea, abd pain post meals or supplements -Last BM on 1/16,RN noted it to be formed. Consider use of stool softener if pt unable to have BM by 1/20 -Wt decreased 7 lbs in one week, likely fluid related (hx of CHF) as well poor PO intake  Height: Ht Readings from Last 1 Encounters:  10/20/14 _0  (1.6 m)    Weight Status:   Wt Readings from Last 1 Encounters:  10/28/14 134 lb 11.2 oz (61.1 kg)  10/20/14 141 lb  Re-estimated needs:  Kcal: 1600-1800 Protein: 80-90 g Fluid: 1.6-1.8 L/day   Skin: WDL  Diet Order: Diet Heart   Intake/Output Summary (Last 24 hours) at 10/28/14 1432 Last data filed at 10/28/14 1200  Gross per 24 hour  Intake    290 ml  Output    350 ml  Net    -60 ml    Last BM: 1/16   Labs:   Recent Labs Lab 10/24/14 0435 10/28/14 0500  NA 138 137  K 3.8 3.9  CL 104 104  CO2 27 27  BUN 17 20  CREATININE 0.69 0.81  CALCIUM 7.7* 7.7*  GLUCOSE 92 95    CBG (last 3)  No results  for input(s): GLUCAP in the last 72 hours.  Scheduled Meds: . budesonide (PULMICORT) nebulizer solution  0.25 mg Nebulization BID  .  ceFAZolin (ANCEF) IV  1 g Intravenous Q8H  . feeding supplement (ENSURE COMPLETE)  237 mL Oral BID BM  . fentaNYL  25 mcg Transdermal Q72H  . ferrous sulfate  325 mg Oral TID WC  . gabapentin  300 mg Oral TID  . guaiFENesin  600 mg Oral BID  . ipratropium-albuterol  3 mL Nebulization TID  . pantoprazole  40 mg Oral BID  . saccharomyces boulardii  250 mg Oral BID  . sertraline  75 mg Oral Daily  . sodium chloride  3 mL Intravenous Q12H    Continuous Infusions:   Atlee Abide MS RD LDN Clinical Dietitian [phone]

## 2014-10-28 NOTE — Progress Notes (Signed)
RT placed patient on 1 liter oxygen due to patients oxygen level being 895. RN aware.

## 2014-10-28 NOTE — Progress Notes (Signed)
[facility] PROGRESS NOTE  Interim summary 81 y/o female with h/o htn, ckd, cva and chronic pain admitted secondary to SOB and productive cough due to HCAP. Also with acute encephalopathy. Course complicated with bacteremia (2/2 staph aureus coagulase neg). Patient still spiking fever and waiting to be afebrile for 24 hours before PICC placement. Is from [facility] and will return there at discharge.  Assessment/Plan: HCAP (healthcare-associated pneumonia) and staph areus coagulase negative Bacteremia 2/2 -patient spike fever overnight; will check UAa nd urine cx; repeat CXR reassurance and w/o signs of worsening PNA. Will hold on placing PICC until no fever for 24 hours as recommended by ID. -discussed with ID and recommendations includes 2-D echo, repeat Blood cx's and treat with cefazolin IV for 4 weeks (day 3/28) -second set of blood cx's neg (preliminary in 48 hours) -will continue pulmicort and duonebs nebulizer as needed -continue use of mucinex and flutter valve -follow clinical response and continue supportive care  Encephalopathy acute -Due to infectious etiology and use of narcotics at home; ? If h/o dementia. -Mentation continue improving -continue minimizing/holding narcotics and will continue constant reorientation and treating infection  Chronic pain syndrome -Stable and currently patient describing intermittent pain on her legs -will continue minimizing/holding narcotics.  Chronic Diastolic dysfunction -will continue checking daily weights and I's and O's -patient w/o crackles, JVD or LE edema -compensated -due to poor PO intake, will liberalize diet  Essential hypertension -overall stable -will continue home meds  Mild elevation in troponins: - no CP or SOB, no EKG changes suggesting ischemia. -will monitor for now -most likely mild demand ischemia with ongoing PNA and anemia -2-D echo with preserved EF, grade 2 diastolic heart failure and no  wall motion abnormalies  Anemia: iron deficiency -no signs of bleeding -s/p 1 unit of PRBC's and Hgb in 6.4 range -will follow Hgb trend -FOBT positive (discussed with family and given no signs of active bleeding, they will like to hold on endoscopic procedures or GI consult at this moment. Patient seen by Dr. Collene Mares in the past) -patient and family reported Positive FOBT in the past and has normal colonoscopy -will continue PPI BID  Diarrhea:  -C. Diff neg -continue BID florastor  Code Status: full Family Communication: none Disposition Plan: inpatient and with plans to go back to SNF at discharge   Consultants:  ID (Dr. Tommy Medal and Dr. Megan Salon; no new abx's recommended at this point; wait for patient to 24 hours afebrile before placing PICC)  Procedures:  CXR  2-D echo  Study Conclusions - Left ventricle: The cavity size was normal. There was mild focal basal hypertrophy of the septum. Systolic function was normal. The estimated ejection fraction was in the range of 55% to 60%. Wall motion was normal; there were no regional wall motion abnormalities. Features are consistent with a pseudonormal left ventricular filling pattern, with concomitant abnormal relaxation and increased filling pressure (grade 2 diastolic dysfunction). - Aortic valve: There was mild stenosis. Valve area (VTI): 1.6 cm^2. Valve area (Vmax): 1.64 cm^2. - Mitral valve: There was moderate regurgitation. - Left atrium: The atrium was moderately dilated. - Atrial septum: There was a possible patent foramen ovale. - Tricuspid valve: There was moderate regurgitation. - Pulmonary arteries: Systolic pressure was severely increased. PA peak pressure: 86 mm Hg (S).  Impressions:  - Normal LV function; grade 2 diastolic dysfunction; moderate LAE; calcified aortic valve with mild AS; moderate MR; moderate TR with severely elevated pulmonary pressure.  Antibiotics:  Vanc and  cefepime 1.10.16>> 1.14.16  Cefazolin 10/25/14 (with intentions to treat for 4 weeks)  HPI/Subjective: No CP. Patient breathing remains stable and repeat CXR is reassurance. She spike fever overnight and maintain low grade temp in am.  Objective: Filed Vitals:   10/28/14 0636 10/28/14 0637 10/28/14 0815 10/28/14 1532  BP:  126/62    Pulse:  83    Temp:  99.6 F (37.6 C)    TempSrc:  Oral    Resp:  17    Height:      Weight: 61.1 kg (134 lb 11.2 oz)     SpO2:  98% 97% 92%    Intake/Output Summary (Last 24 hours) at 10/28/14 1629 Last data filed at 10/28/14 1200  Gross per 24 hour  Intake    290 ml  Output    350 ml  Net    -60 ml   Filed Weights   10/25/14 1431 10/26/14 0500 10/28/14 0636  Weight: 65.8 kg (145 lb 1 oz) 62.6 kg (138 lb 0.1 oz) 61.1 kg (134 lb 11.2 oz)    Exam:  General: patient spike fever overnight and has maintain low grade temp during the day, in no acute distress. Complaining of bilat lower ext pain. No melena or hematochezia HEENT: No bruits, no goiter. No JVD Heart: Regular rate and rhythm; no rubs or gallops Lungs: continue improving her breathing; still with positive rhonchi; not wheezing; coarse BS appreciated Abdomen: Soft, nontender, nondistended, positive bowel sounds.   Data Reviewed: Basic Metabolic Panel:  Recent Labs Lab 10/24/14 0435 10/28/14 0500  NA 138 137  K 3.8 3.9  CL 104 104  CO2 27 27  GLUCOSE 92 95  BUN 17 20  CREATININE 0.69 0.81  CALCIUM 7.7* 7.7*   CBC:  Recent Labs Lab 10/22/14 0420 10/24/14 0435 10/24/14 0702 10/24/14 1515 10/25/14 0536 10/26/14 1000 10/28/14 0500  WBC 9.9 12.9*  --   --  14.2* 11.2* 18.0*  HGB 7.5* 6.8* 6.8* 7.6* 6.4* 8.8* 8.2*  HCT 24.8* 21.6* 21.7* 24.2* 19.8* 27.3* 26.2*  MCV 95.0 94.3  --   --  87.6 87.2 88.5  PLT 158 176  --   --  177 207 330    Recent Results (from the past 240 hour(s))  Urine culture     Status: None   Collection Time: 10/19/14  6:16 PM  Result Value Ref  Range Status   Specimen Description URINE, CATHETERIZED  Final   Special Requests NONE  Final   Colony Count NO GROWTH Performed at [facility]   Final   Culture NO GROWTH Performed at [facility]   Final   Report Status 10/20/2014 FINAL  Final  Culture, blood (routine x 2)     Status: None   Collection Time: 10/19/14  7:01 PM  Result Value Ref Range Status   Specimen Description BLOOD RIGHT ANTECUBITAL  Final   Special Requests BOTTLES DRAWN AEROBIC AND ANAEROBIC 3CC  Final   Culture   Final    STAPHYLOCOCCUS SPECIES (COAGULASE NEGATIVE) Note: SUSCEPTIBILITIES PERFORMED ON PREVIOUS CULTURE WITHIN THE LAST 5 DAYS. Note: Gram Stain Report Called to,Read Back By and Verified With: SHANICE VENABLE ON 10/20/2014 AT 11:33P BY Dennard Nip Performed at [facility]    Report Status 10/23/2014 FINAL  Final  Culture, blood (routine x 2)     Status: None   Collection Time: 10/19/14  7:02 PM  Result Value Ref Range Status   Specimen Description  BLOOD RIGHT HAND  Final   Special Requests BOTTLES DRAWN AEROBIC AND ANAEROBIC 4CC  Final   Culture   Final    STAPHYLOCOCCUS SPECIES (COAGULASE NEGATIVE) Note: RIFAMPIN AND GENTAMICIN SHOULD NOT BE USED AS SINGLE DRUGS FOR TREATMENT OF STAPH INFECTIONS. Note: Gram Stain Report Called to,Read Back By and Verified With: SHANICE VENABLE ON 10/20/2014 AT 11:33P BY WILEJ Performed at [facility]    Report Status 10/23/2014 FINAL  Final   Organism ID, Bacteria STAPHYLOCOCCUS SPECIES (COAGULASE NEGATIVE)  Final      Susceptibility   Staphylococcus species (coagulase negative) - MIC*    CLINDAMYCIN <=0.25 SENSITIVE Sensitive     ERYTHROMYCIN <=0.25 SENSITIVE Sensitive     GENTAMICIN <=0.5 SENSITIVE Sensitive     LEVOFLOXACIN 4 INTERMEDIATE Intermediate     OXACILLIN <=0.25 SENSITIVE Sensitive     PENICILLIN >=0.5 RESISTANT Resistant     RIFAMPIN <=0.5 SENSITIVE Sensitive     TRIMETH/SULFA <=10 SENSITIVE Sensitive      VANCOMYCIN <=0.5 SENSITIVE Sensitive     TETRACYCLINE <=1 SENSITIVE Sensitive     * STAPHYLOCOCCUS SPECIES (COAGULASE NEGATIVE)  Gram stain     Status: None   Collection Time: 10/23/14  6:19 AM  Result Value Ref Range Status   Specimen Description TRACHEAL SITE  Final   Special Requests NONE  Final   Gram Stain   Final    ABUNDANT WBC PRESENT, PREDOMINANTLY PMN MODERATE SQUAMOUS EPITHELIAL CELLS PRESENT RARE YEAST Performed at [facility]    Report Status 10/23/2014 FINAL  Final  Culture, blood (routine x 2)     Status: None (Preliminary result)   Collection Time: 10/23/14  5:15 PM  Result Value Ref Range Status   Specimen Description BLOOD LEFT HAND  Final   Special Requests BOTTLES DRAWN AEROBIC ONLY  3 CC  Final   Culture   Final           BLOOD CULTURE RECEIVED NO GROWTH TO DATE CULTURE WILL BE HELD FOR 5 DAYS BEFORE ISSUING A FINAL NEGATIVE REPORT Performed at [facility]    Report Status PENDING  Incomplete  Culture, blood (routine x 2)     Status: None (Preliminary result)   Collection Time: 10/23/14  5:19 PM  Result Value Ref Range Status   Specimen Description BLOOD RIGHT HAND  Final   Special Requests BOTTLES DRAWN AEROBIC ONLY 3 CC  Final   Culture   Final           BLOOD CULTURE RECEIVED NO GROWTH TO DATE CULTURE WILL BE HELD FOR 5 DAYS BEFORE ISSUING A FINAL NEGATIVE REPORT Performed at [facility]    Report Status PENDING  Incomplete  Clostridium Difficile by PCR     Status: None   Collection Time: 10/24/14  4:24 PM  Result Value Ref Range Status   C difficile by pcr NEGATIVE NEGATIVE Final    Comment: Performed at [facility]     Studies: Dg Chest 2 View  10/27/2014   CLINICAL DATA:  Followup pneumonia, cough, congestion  EXAM: CHEST  2 VIEW  COMPARISON:  10/19/2014  FINDINGS: Cardiomediastinal silhouette is stable. Mild hyperinflation. Improvement in aeration without convincing pulmonary edema or segmental infiltrate.  Degenerative changes thoracolumbar spine. Degenerative changes bilateral shoulders.  IMPRESSION: Improvement in aeration without pulmonary edema or segmental infiltrate. Mild hyperinflation. Degenerative changes thoracolumbar spine and bilateral shoulders.   Electronically Signed   By: Lahoma Crocker M.D.   On: 10/27/2014 10:34  Scheduled Meds: . budesonide (PULMICORT) nebulizer solution  0.25 mg Nebulization BID  .  ceFAZolin (ANCEF) IV  1 g Intravenous Q8H  . feeding supplement (ENSURE COMPLETE)  237 mL Oral BID BM  . fentaNYL  25 mcg Transdermal Q72H  . ferrous sulfate  325 mg Oral TID WC  . gabapentin  300 mg Oral TID  . guaiFENesin  600 mg Oral BID  . ipratropium-albuterol  3 mL Nebulization TID  . pantoprazole  40 mg Oral BID  . saccharomyces boulardii  250 mg Oral BID  . sertraline  75 mg Oral Daily  . sodium chloride  3 mL Intravenous Q12H   Continuous Infusions:   Time: 30 minutes  Barton Dubois  [facility] Pager [phone]. If 8PM-8AM, please contact night-coverage at [email or web address], password [facility] 10/28/2014, 4:29 PM  LOS: 9 days

## 2014-10-29 ENCOUNTER — Inpatient Hospital Stay (HOSPITAL_COMMUNITY): Payer: Medicare Other

## 2014-10-29 LAB — BASIC METABOLIC PANEL
Anion gap: 5 (ref 5–15)
BUN: 25 mg/dL — AB (ref 6–23)
CO2: 27 mmol/L (ref 19–32)
Calcium: 7.7 mg/dL — ABNORMAL LOW (ref 8.4–10.5)
Chloride: 105 mEq/L (ref 96–112)
Creatinine, Ser: 0.8 mg/dL (ref 0.50–1.10)
GFR calc Af Amer: 75 mL/min — ABNORMAL LOW (ref >90)
GFR, EST NON AFRICAN AMERICAN: 64 mL/min — AB (ref >90)
GLUCOSE: 92 mg/dL (ref 70–99)
Potassium: 4.1 mmol/L (ref 3.5–5.1)
Sodium: 137 mmol/L (ref 135–145)

## 2014-10-29 LAB — URINE CULTURE

## 2014-10-29 LAB — CBC
HEMATOCRIT: 24.7 % — AB (ref 36.0–46.0)
Hemoglobin: 7.6 g/dL — ABNORMAL LOW (ref 12.0–15.0)
MCH: 27.7 pg (ref 26.0–34.0)
MCHC: 30.8 g/dL (ref 30.0–36.0)
MCV: 90.1 fL (ref 78.0–100.0)
Platelets: 310 10*3/uL (ref 150–400)
RBC: 2.74 MIL/uL — ABNORMAL LOW (ref 3.87–5.11)
RDW: 17.5 % — AB (ref 11.5–15.5)
WBC: 15.8 10*3/uL — ABNORMAL HIGH (ref 4.0–10.5)

## 2014-10-29 LAB — URIC ACID: Uric Acid, Serum: 4.1 mg/dL (ref 2.4–7.0)

## 2014-10-29 MED ORDER — IPRATROPIUM-ALBUTEROL 0.5-2.5 (3) MG/3ML IN SOLN
3.0000 mL | Freq: Two times a day (BID) | RESPIRATORY_TRACT | Status: DC
  Administered 2014-10-29 – 2014-11-05 (×14): 3 mL via RESPIRATORY_TRACT
  Filled 2014-10-29 (×15): qty 3

## 2014-10-29 NOTE — Progress Notes (Signed)
[facility] PROGRESS NOTE  [name] IPJ:[id number] DOB: 06/30/1925 DOA: 10/19/2014 PCP: No primary care provider on file.   Brief narrative 81 year old female with history of hypertension, CVA, chronic kidney disease and chronic pain was admitted with healthcare associated pneumonia and acute encephalopathy. Hospital course completed due to bacteremia (2/2 coagulase -negative staph aureus. ). Patient still having temperature spikes and on antibiotic awaiting to be afebrile for 24 hours before PICC placed for IV antibiotics and discharged to [facility] skilled nursing facility.  Assessment/Plan: Healthcare associated pneumonia and staph 40 as coagulase-negative bacteremia Patient still having temperature spikes. Will recheck chest x-ray. UA unremarkable. Ordered repeat blood culture. 2-D echo with grade 2 diastolic dysfunction and moderate mitral and tricuspid regurgitation no signs of vegetation. Continue Ancef. -Supportive care with Pulmicort and DuoNeb's. Continue Mucinex -If continues to have temperature spikes ID needs to be consulted in a.m.  Acute encephalopathy Appears to be improving with episodes of somnolence. Likely in the setting of underlying infection and narcotic use Minimize narcotics and benzos  Chronic pain syndrome Patient has intermittent pains in her legs and neck. Will check x-ray of the ankles and cervical spine. Check uric acid  Chronic diastolic dysfunction Monitor daily weights and I/0. Appears euvolemic  Iron deficiency anemia Received one unit PRBC during this hospitalization. FOBT was positive. Prior Hospitalist discussed with family and given nose active signs of bleeding did not want GI consult or procedures at this time Continue twice a day PPI -Patient seen by Dr. Collene Mares in the past  Diarrhea Stool for C. difficile negative. Continue Florator  Code Status: Full code Family Communication: None at bedside Disposition Plan: To skilled  nursing facility once afebrile and PICC line place   Consultants:  None  Procedures:  None  Antibiotics: Antibiotics:  Vanc and cefepime 1.10.16>> 1.14.16  Cefazolin 10/25/14 (with intentions to treat for 4 weeks); previous hospitalist had discussed with ID consult on the phone.  HPI/Subjective: Patient seen and examined. Complains of pain in her neck and bilateral ankles. Having temperature spike of 100.32F this afternoon.  Objective: Filed Vitals:   10/29/14 1356  BP: 138/55  Pulse: 85  Temp: 100.7 F (38.2 C)  Resp: 18    Intake/Output Summary (Last 24 hours) at 10/29/14 1551 Last data filed at 10/29/14 1412  Gross per 24 hour  Intake    120 ml  Output      0 ml  Net    120 ml   Filed Weights   10/25/14 1431 10/26/14 0500 10/28/14 0636  Weight: 65.8 kg (145 lb 1 oz) 62.6 kg (138 lb 0.1 oz) 61.1 kg (134 lb 11.2 oz)    Exam:   General:  Elderly thin built female lying in bed in no acute distress  HEENT: Moist oral mucosa, tender to palpation over posterior neck  Chest: Clear to auscultation bilaterally, no added sounds  CVS: Normal S1 and S2, no murmurs rub or gallop  GI soft, nondistended, nontender, bowel sounds present  Extremities: Mild warmth over left ankle with tenderness over bilateral ankle joint and tibia  CNS: Alert and oriented      Data Reviewed: Basic Metabolic Panel:  Recent Labs Lab 10/24/14 0435 10/28/14 0500 10/29/14 0402  NA 138 137 137  K 3.8 3.9 4.1  CL 104 104 105  CO2 27 27 27   GLUCOSE 92 95 92  BUN 17 20 25*  CREATININE 0.69 0.81 0.80  CALCIUM 7.7* 7.7* 7.7*   Liver Function Tests: No results  for input(s): AST, ALT, ALKPHOS, BILITOT, PROT, ALBUMIN in the last 168 hours. No results for input(s): LIPASE, AMYLASE in the last 168 hours. No results for input(s): AMMONIA in the last 168 hours. CBC:  Recent Labs Lab 10/24/14 0435  10/24/14 1515 10/25/14 0536 10/26/14 1000 10/28/14 0500 10/29/14 0402  WBC  12.9*  --   --  14.2* 11.2* 18.0* 15.8*  HGB 6.8*  < > 7.6* 6.4* 8.8* 8.2* 7.6*  HCT 21.6*  < > 24.2* 19.8* 27.3* 26.2* 24.7*  MCV 94.3  --   --  87.6 87.2 88.5 90.1  PLT 176  --   --  177 207 330 310  < > = values in this interval not displayed. Cardiac Enzymes: No results for input(s): CKTOTAL, CKMB, CKMBINDEX, TROPONINI in the last 168 hours. BNP (last 3 results) No results for input(s): PROBNP in the last 8760 hours. CBG: No results for input(s): GLUCAP in the last 168 hours.  Recent Results (from the past 240 hour(s))  Urine culture     Status: None   Collection Time: 10/19/14  6:16 PM  Result Value Ref Range Status   Specimen Description URINE, CATHETERIZED  Final   Special Requests NONE  Final   Colony Count NO GROWTH Performed at [facility]   Final   Culture NO GROWTH Performed at [facility]   Final   Report Status 10/20/2014 FINAL  Final  Culture, blood (routine x 2)     Status: None   Collection Time: 10/19/14  7:01 PM  Result Value Ref Range Status   Specimen Description BLOOD RIGHT ANTECUBITAL  Final   Special Requests BOTTLES DRAWN AEROBIC AND ANAEROBIC 3CC  Final   Culture   Final    STAPHYLOCOCCUS SPECIES (COAGULASE NEGATIVE) Note: SUSCEPTIBILITIES PERFORMED ON PREVIOUS CULTURE WITHIN THE LAST 5 DAYS. Note: Gram Stain Report Called to,Read Back By and Verified With: SHANICE VENABLE ON 10/20/2014 AT 11:33P BY WILEJ Performed at [facility]    Report Status 10/23/2014 FINAL  Final  Culture, blood (routine x 2)     Status: None   Collection Time: 10/19/14  7:02 PM  Result Value Ref Range Status   Specimen Description BLOOD RIGHT HAND  Final   Special Requests BOTTLES DRAWN AEROBIC AND ANAEROBIC 4CC  Final   Culture   Final    STAPHYLOCOCCUS SPECIES (COAGULASE NEGATIVE) Note: RIFAMPIN AND GENTAMICIN SHOULD NOT BE USED AS SINGLE DRUGS FOR TREATMENT OF STAPH INFECTIONS. Note: Gram Stain Report Called to,Read Back By and Verified With:  SHANICE VENABLE ON 10/20/2014 AT 11:33P BY WILEJ Performed at [facility]    Report Status 10/23/2014 FINAL  Final   Organism ID, Bacteria STAPHYLOCOCCUS SPECIES (COAGULASE NEGATIVE)  Final      Susceptibility   Staphylococcus species (coagulase negative) - MIC*    CLINDAMYCIN <=0.25 SENSITIVE Sensitive     ERYTHROMYCIN <=0.25 SENSITIVE Sensitive     GENTAMICIN <=0.5 SENSITIVE Sensitive     LEVOFLOXACIN 4 INTERMEDIATE Intermediate     OXACILLIN <=0.25 SENSITIVE Sensitive     PENICILLIN >=0.5 RESISTANT Resistant     RIFAMPIN <=0.5 SENSITIVE Sensitive     TRIMETH/SULFA <=10 SENSITIVE Sensitive     VANCOMYCIN <=0.5 SENSITIVE Sensitive     TETRACYCLINE <=1 SENSITIVE Sensitive     * STAPHYLOCOCCUS SPECIES (COAGULASE NEGATIVE)  Gram stain     Status: None   Collection Time: 10/23/14  6:19 AM  Result Value Ref Range Status   Specimen Description TRACHEAL  SITE  Final   Special Requests NONE  Final   Gram Stain   Final    ABUNDANT WBC PRESENT, PREDOMINANTLY PMN MODERATE SQUAMOUS EPITHELIAL CELLS PRESENT RARE YEAST Performed at [facility]    Report Status 10/23/2014 FINAL  Final  Culture, blood (routine x 2)     Status: None (Preliminary result)   Collection Time: 10/23/14  5:15 PM  Result Value Ref Range Status   Specimen Description BLOOD LEFT HAND  Final   Special Requests BOTTLES DRAWN AEROBIC ONLY  3 CC  Final   Culture   Final           BLOOD CULTURE RECEIVED NO GROWTH TO DATE CULTURE WILL BE HELD FOR 5 DAYS BEFORE ISSUING A FINAL NEGATIVE REPORT Performed at [facility]    Report Status PENDING  Incomplete  Culture, blood (routine x 2)     Status: None (Preliminary result)   Collection Time: 10/23/14  5:19 PM  Result Value Ref Range Status   Specimen Description BLOOD RIGHT HAND  Final   Special Requests BOTTLES DRAWN AEROBIC ONLY 3 CC  Final   Culture   Final           BLOOD CULTURE RECEIVED NO GROWTH TO DATE CULTURE WILL BE HELD FOR 5 DAYS  BEFORE ISSUING A FINAL NEGATIVE REPORT Performed at [facility]    Report Status PENDING  Incomplete  Clostridium Difficile by PCR     Status: None   Collection Time: 10/24/14  4:24 PM  Result Value Ref Range Status   C difficile by pcr NEGATIVE NEGATIVE Final    Comment: Performed at [facility]  Culture, Urine     Status: None   Collection Time: 10/28/14 12:06 PM  Result Value Ref Range Status   Specimen Description URINE, CLEAN CATCH  Final   Special Requests NONE  Final   Colony Count   Final    25,000 COLONIES/ML Performed at [facility]    Culture   Final    Multiple bacterial morphotypes present, none predominant. Suggest appropriate recollection if clinically indicated. Performed at [facility]    Report Status 10/29/2014 FINAL  Final     Studies: No results found.  Scheduled Meds: . budesonide (PULMICORT) nebulizer solution  0.25 mg Nebulization BID  .  ceFAZolin (ANCEF) IV  1 g Intravenous Q8H  . feeding supplement (ENSURE COMPLETE)  237 mL Oral BID BM  . fentaNYL  25 mcg Transdermal Q72H  . ferrous sulfate  325 mg Oral TID WC  . gabapentin  300 mg Oral TID  . guaiFENesin  600 mg Oral BID  . ipratropium-albuterol  3 mL Nebulization BID  . pantoprazole  40 mg Oral BID  . saccharomyces boulardii  250 mg Oral BID  . sertraline  75 mg Oral Daily  . sodium chloride  3 mL Intravenous Q12H   Continuous Infusions:   Principal Problem:   HCAP (healthcare-associated pneumonia) Active Problems:   Essential hypertension   Diastolic dysfunction   Chronic pain syndrome   Encephalopathy acute   Bacteremia    Time spent: 25 minutes    Deriyah Kunath, [place]  [facility] Pager [phone]. If 7PM-7AM, please contact night-coverage at [email or web address], password [facility] 10/29/2014, 3:51 PM  LOS: 10 days

## 2014-10-29 NOTE — Progress Notes (Signed)
ANTIBIOTIC CONSULT NOTE - FOLLOW UP  Pharmacy Consult for Cefazolin Indication: Bacteremia  Allergies  Allergen Reactions  . Codeine     Hallucinations  . Ibuprofen     Confusion    Patient Measurements: Height: 5\' 3"  (160 cm) Weight: 134 lb 11.2 oz (61.1 kg) IBW/kg (Calculated) : 52.4  Vital Signs: Temp: 100.7 F (38.2 C) (01/20 1356) Temp Source: Oral (01/20 1356) BP: 138/55 mmHg (01/20 1356) Pulse Rate: 85 (01/20 1356) Intake/Output from previous day: 01/19 0701 - 01/20 0700 In: 410 [P.O.:360; IV Piggyback:50] Out: 350 [Urine:350] Intake/Output from this shift: Total I/O In: 120 [P.O.:120] Out: -   Labs:  Recent Labs  10/28/14 0500 10/29/14 0402  WBC 18.0* 15.8*  HGB 8.2* 7.6*  PLT 330 310  CREATININE 0.81 0.80   Estimated Creatinine Clearance: 41 mL/min (by C-G formula based on Cr of 0.8). No results for input(s): VANCOTROUGH, VANCOPEAK, VANCORANDOM, GENTTROUGH, GENTPEAK, GENTRANDOM, TOBRATROUGH, TOBRAPEAK, TOBRARND, AMIKACINPEAK, AMIKACINTROU, AMIKACIN in the last 72 hours.   Microbiology: Recent Results (from the past 720 hour(s))  Urine culture     Status: None   Collection Time: 10/19/14  6:16 PM  Result Value Ref Range Status   Specimen Description URINE, CATHETERIZED  Final   Special Requests NONE  Final   Colony Count NO GROWTH Performed at [facility]   Final   Culture NO GROWTH Performed at [facility]   Final   Report Status 10/20/2014 FINAL  Final  Culture, blood (routine x 2)     Status: None   Collection Time: 10/19/14  7:01 PM  Result Value Ref Range Status   Specimen Description BLOOD RIGHT ANTECUBITAL  Final   Special Requests BOTTLES DRAWN AEROBIC AND ANAEROBIC 3CC  Final   Culture   Final    STAPHYLOCOCCUS SPECIES (COAGULASE NEGATIVE) Note: SUSCEPTIBILITIES PERFORMED ON PREVIOUS CULTURE WITHIN THE LAST 5 DAYS. Note: Gram Stain Report Called to,Read Back By and Verified With: SHANICE VENABLE ON 10/20/2014 AT  11:33P BY WILEJ Performed at [facility]    Report Status 10/23/2014 FINAL  Final  Culture, blood (routine x 2)     Status: None   Collection Time: 10/19/14  7:02 PM  Result Value Ref Range Status   Specimen Description BLOOD RIGHT HAND  Final   Special Requests BOTTLES DRAWN AEROBIC AND ANAEROBIC 4CC  Final   Culture   Final    STAPHYLOCOCCUS SPECIES (COAGULASE NEGATIVE) Note: RIFAMPIN AND GENTAMICIN SHOULD NOT BE USED AS SINGLE DRUGS FOR TREATMENT OF STAPH INFECTIONS. Note: Gram Stain Report Called to,Read Back By and Verified With: SHANICE VENABLE ON 10/20/2014 AT 11:33P BY WILEJ Performed at [facility]    Report Status 10/23/2014 FINAL  Final   Organism ID, Bacteria STAPHYLOCOCCUS SPECIES (COAGULASE NEGATIVE)  Final      Susceptibility   Staphylococcus species (coagulase negative) - MIC*    CLINDAMYCIN <=0.25 SENSITIVE Sensitive     ERYTHROMYCIN <=0.25 SENSITIVE Sensitive     GENTAMICIN <=0.5 SENSITIVE Sensitive     LEVOFLOXACIN 4 INTERMEDIATE Intermediate     OXACILLIN <=0.25 SENSITIVE Sensitive     PENICILLIN >=0.5 RESISTANT Resistant     RIFAMPIN <=0.5 SENSITIVE Sensitive     TRIMETH/SULFA <=10 SENSITIVE Sensitive     VANCOMYCIN <=0.5 SENSITIVE Sensitive     TETRACYCLINE <=1 SENSITIVE Sensitive     * STAPHYLOCOCCUS SPECIES (COAGULASE NEGATIVE)  Gram stain     Status: None   Collection Time: 10/23/14  6:19 AM  Result  Value Ref Range Status   Specimen Description TRACHEAL SITE  Final   Special Requests NONE  Final   Gram Stain   Final    ABUNDANT WBC PRESENT, PREDOMINANTLY PMN MODERATE SQUAMOUS EPITHELIAL CELLS PRESENT RARE YEAST Performed at [facility]    Report Status 10/23/2014 FINAL  Final  Culture, blood (routine x 2)     Status: None (Preliminary result)   Collection Time: 10/23/14  5:15 PM  Result Value Ref Range Status   Specimen Description BLOOD LEFT HAND  Final   Special Requests BOTTLES DRAWN AEROBIC ONLY  3 CC  Final    Culture   Final           BLOOD CULTURE RECEIVED NO GROWTH TO DATE CULTURE WILL BE HELD FOR 5 DAYS BEFORE ISSUING A FINAL NEGATIVE REPORT Performed at [facility]    Report Status PENDING  Incomplete  Culture, blood (routine x 2)     Status: None (Preliminary result)   Collection Time: 10/23/14  5:19 PM  Result Value Ref Range Status   Specimen Description BLOOD RIGHT HAND  Final   Special Requests BOTTLES DRAWN AEROBIC ONLY 3 CC  Final   Culture   Final           BLOOD CULTURE RECEIVED NO GROWTH TO DATE CULTURE WILL BE HELD FOR 5 DAYS BEFORE ISSUING A FINAL NEGATIVE REPORT Performed at [facility]    Report Status PENDING  Incomplete  Clostridium Difficile by PCR     Status: None   Collection Time: 10/24/14  4:24 PM  Result Value Ref Range Status   C difficile by pcr NEGATIVE NEGATIVE Final    Comment: Performed at [facility]  Culture, Urine     Status: None   Collection Time: 10/28/14 12:06 PM  Result Value Ref Range Status   Specimen Description URINE, CLEAN CATCH  Final   Special Requests NONE  Final   Colony Count   Final    25,000 COLONIES/ML Performed at [facility]    Culture   Final    Multiple bacterial morphotypes present, none predominant. Suggest appropriate recollection if clinically indicated. Performed at [facility]    Report Status 10/29/2014 FINAL  Final   Assessment: 81yo F from [facility] with fever, cough, lethergy, and AMS. Single-doses of Cefepime and Azithromycin are ordered in the ED. Pharmacy is asked to dose Vanc for HCAP. Weight 62kg on 09/18/14.  Switched to Ancef on 1/14  1/10 >> Azithro x1 1/10 >> Cefepime x1, restarted 1/11 1/10 >> Vanc >> 1/14 1/11 >> aztreonam >> 1/11 1/14 >> Ancef >>  Tmax: spiking more  WBCs: only mildly elevated but now trending up Renal: SCr 0.69 (1/15), 41 ml/min CG  1/10 blood: 2/2 CoNS 1/10 urine: NGF 1/11 strep UAg: negative 1/11 Legionella UAg: neg 1/11 flu panel: not  detected 1/14 blood x2: NGtd (repeat BCx) 1/14 sputum: rare yeast 1/15 Cdiff: neg 1/19: urine: sent   Drug level / dose changes info: 1/13 VT @ 2100 13.3 mg/L 1/14: D5 Vanc 1g x 1 then 500mg  q12h. Now cefepime 2g q24. MD only ordered cefepime x 1 dose, and note says continue both abx, so have re-ordered cefepime as above. Continue vanc w/ gpcs in blood -VT=13.3, no fever/WBCs normal, will order 750mg  Vanc x1 @ 0800 1/14 then back to 500mg  q12h to hopefully bump up trough slightly without increasing dose.  UPDATE: Changed to Ancef per pharm, use 1g IV q8h  Goal of Therapy:  Appropriate antibiotic dosing for renal function; eradication of infection  Plan:   Continue  Ancef 1g IV q8h (renal adjustment of usual 2g q8h dose)   Monitor renal function, repeat blood cultures  Elevated temps noted since 1/17; unsure of possible etiology, but would defer to ID regarding choice of antibiotic.   Reuel Boom, PharmD Pager: [phone] 10/29/2014, 4:18 PM

## 2014-10-29 NOTE — Progress Notes (Signed)
CSW continues to follow this pt from [facility] to assist with d/c planning. SNF will readmit when pt is stable. Ongoing updates provided to SNF.  Werner Lean LCSW [phone]

## 2014-10-30 DIAGNOSIS — B9561 Methicillin susceptible Staphylococcus aureus infection as the cause of diseases classified elsewhere: Secondary | ICD-10-CM

## 2014-10-30 LAB — CBC
HEMATOCRIT: 25 % — AB (ref 36.0–46.0)
HEMOGLOBIN: 7.9 g/dL — AB (ref 12.0–15.0)
MCH: 28.2 pg (ref 26.0–34.0)
MCHC: 31.6 g/dL (ref 30.0–36.0)
MCV: 89.3 fL (ref 78.0–100.0)
Platelets: 379 10*3/uL (ref 150–400)
RBC: 2.8 MIL/uL — AB (ref 3.87–5.11)
RDW: 16.9 % — AB (ref 11.5–15.5)
WBC: 14.9 10*3/uL — ABNORMAL HIGH (ref 4.0–10.5)

## 2014-10-30 LAB — CULTURE, BLOOD (ROUTINE X 2)
CULTURE: NO GROWTH
Culture: NO GROWTH

## 2014-10-30 MED ORDER — ACETAMINOPHEN 325 MG PO TABS
650.0000 mg | ORAL_TABLET | Freq: Once | ORAL | Status: AC
  Administered 2014-10-30: 650 mg via ORAL
  Filled 2014-10-30: qty 2

## 2014-10-30 NOTE — Progress Notes (Signed)
[facility] PROGRESS NOTE  FERNANDO TORRY GYB:[id number] DOB: October 01, 1925 DOA: 10/19/2014 PCP: No primary care provider on file.   Brief narrative 81 year old female with history of hypertension, CVA, chronic kidney disease and chronic pain was admitted with healthcare associated pneumonia and acute encephalopathy. Hospital course completed due to bacteremia (2/2 coagulase -negative staph aureus. ). Patient still having temperature spikes and on antibiotic awaiting to be afebrile for 24 hours before PICC placed for IV antibiotics as an outpatient and discharged to [facility] skilled [facility] facility.  Assessment/Plan: Healthcare associated pneumonia and staph 40 as coagulase-negative bacteremia -Patient still having temperature spikes. (repeated CXR w/o worsening PNA or acute finding to explain fever; UA unremarkable).  -2-D echo with grade 2 diastolic dysfunction and moderate mitral and tricuspid regurgitation no signs of vegetation. -Continue Ancef. -Supportive care with Pulmicort and DuoNeb's. Continue Mucinex -ID to see patient as fever has continue; will follow rec's  Acute encephalopathy -Appears to be improving with episodes of somnolence (especially around pain meds). Likely in the setting of underlying infection and narcotic use -Minimize narcotics and benzos  Chronic pain syndrome -Patient has intermittent pains in her legs and neck.  -x-rays w/o acute abnormalities -continue minimizing use of narcotics  Chronic diastolic dysfunction -Monitor daily weights and I/0. Appears euvolemic  Iron deficiency anemia -has received two units PRBC during this hospitalization.  -FOBT was positive. Per discussion with nieces and given the fact of no active signs of bleeding; will hold on GI evaluation at this moment Continue twice a day PPI -Patient seen by Dr. Collene Mares in the past -Hgb 7.9 (1/21)  Diarrhea Stool for C. difficile negative. Continue Florator  Code Status: Full  code Family Communication: None at bedside Disposition Plan: To skilled nursing facility once afebrile and PICC line place   Consultants:  Dr. Megan Salon (ID)  Procedures:  None  Antibiotics: Antibiotics:  Vanc and cefepime 1.10.16>> 1.14.16  Cefazolin 10/25/14 (with intentions to treat for 4 weeks)  HPI/Subjective: Complains of pain in her neck and bilateral legs. Continue spiking fever. No CP. Breathing has improved. Good O2 sat on RA.  Objective: Filed Vitals:   10/30/14 1355  BP: 147/101  Pulse: 86  Temp: 100.9 F (38.3 C)  Resp: 15    Intake/Output Summary (Last 24 hours) at 10/30/14 1407 Last data filed at 10/30/14 1000  Gross per 24 hour  Intake    676 ml  Output    550 ml  Net    126 ml   Filed Weights   10/28/14 0636 10/30/14 0130 10/30/14 0627  Weight: 61.1 kg (134 lb 11.2 oz) 60.6 kg (133 lb 9.6 oz) 57.8 kg (127 lb 6.8 oz)    Exam:   General:  Elderly thin built female; sitting on chair while hair is brushed, in no acute distress. Continue spiking fever  HEENT: Moist oral mucosa, tender to palpation over posterior neck  Chest: scattered rhonchi on exam; otherwise clear to auscultation bilaterally   CVS: Normal S1 and S2, no murmurs rub or gallop  GI soft, nondistended, nontender, bowel sounds present  Extremities: trace edema, no erythema, no abnormalities on x-rays. Patient reports pain on her legs bilaterally  CNS: Alert and oriented      Data Reviewed: Basic Metabolic Panel:  Recent Labs Lab 10/24/14 0435 10/28/14 0500 10/29/14 0402  NA 138 137 137  K 3.8 3.9 4.1  CL 104 104 105  CO2 27 27 27   GLUCOSE 92 95 92  BUN 17 20 25*  CREATININE 0.69 0.81 0.80  CALCIUM 7.7* 7.7* 7.7*   CBC:  Recent Labs Lab 10/25/14 0536 10/26/14 1000 10/28/14 0500 10/29/14 0402 10/30/14 0706  WBC 14.2* 11.2* 18.0* 15.8* 14.9*  HGB 6.4* 8.8* 8.2* 7.6* 7.9*  HCT 19.8* 27.3* 26.2* 24.7* 25.0*  MCV 87.6 87.2 88.5 90.1 89.3  PLT 177 207 330  310 379   Cardiac Enzymes: No results for input(s): CKTOTAL, CKMB, CKMBINDEX, TROPONINI in the last 168 hours. BNP (last 3 results) No results for input(s): PROBNP in the last 8760 hours. CBG: No results for input(s): GLUCAP in the last 168 hours.  Recent Results (from the past 240 hour(s))  Gram stain     Status: None   Collection Time: 10/23/14  6:19 AM  Result Value Ref Range Status   Specimen Description TRACHEAL SITE  Final   Special Requests NONE  Final   Gram Stain   Final    ABUNDANT WBC PRESENT, PREDOMINANTLY PMN MODERATE SQUAMOUS EPITHELIAL CELLS PRESENT RARE YEAST Performed at [facility]    Report Status 10/23/2014 FINAL  Final  Culture, blood (routine x 2)     Status: None   Collection Time: 10/23/14  5:15 PM  Result Value Ref Range Status   Specimen Description BLOOD LEFT HAND  Final   Special Requests BOTTLES DRAWN AEROBIC ONLY  3 CC  Final   Culture   Final    NO GROWTH 5 DAYS Performed at [facility]    Report Status 10/30/2014 FINAL  Final  Culture, blood (routine x 2)     Status: None   Collection Time: 10/23/14  5:19 PM  Result Value Ref Range Status   Specimen Description BLOOD RIGHT HAND  Final   Special Requests BOTTLES DRAWN AEROBIC ONLY 3 CC  Final   Culture   Final    NO GROWTH 5 DAYS Performed at [facility]    Report Status 10/30/2014 FINAL  Final  Clostridium Difficile by PCR     Status: None   Collection Time: 10/24/14  4:24 PM  Result Value Ref Range Status   C difficile by pcr NEGATIVE NEGATIVE Final    Comment: Performed at [facility]  Culture, Urine     Status: None   Collection Time: 10/28/14 12:06 PM  Result Value Ref Range Status   Specimen Description URINE, CLEAN CATCH  Final   Special Requests NONE  Final   Colony Count   Final    25,000 COLONIES/ML Performed at [facility]    Culture   Final    Multiple bacterial morphotypes present, none predominant. Suggest appropriate  recollection if clinically indicated. Performed at [facility]    Report Status 10/29/2014 FINAL  Final  Culture, blood (routine x 2)     Status: None (Preliminary result)   Collection Time: 10/29/14  4:35 PM  Result Value Ref Range Status   Specimen Description BLOOD RIGHT HAND  Final   Special Requests BOTTLES DRAWN AEROBIC ONLY 3 CC  Final   Culture   Final           BLOOD CULTURE RECEIVED NO GROWTH TO DATE CULTURE WILL BE HELD FOR 5 DAYS BEFORE ISSUING A FINAL NEGATIVE REPORT Performed at [facility]    Report Status PENDING  Incomplete  Culture, blood (routine x 2)     Status: None (Preliminary result)   Collection Time: 10/29/14  4:40 PM  Result Value Ref Range Status   Specimen Description  BLOOD RIGHT HAND  Final   Special Requests BOTTLES DRAWN AEROBIC ONLY  3 CC  Final   Culture   Final           BLOOD CULTURE RECEIVED NO GROWTH TO DATE CULTURE WILL BE HELD FOR 5 DAYS BEFORE ISSUING A FINAL NEGATIVE REPORT Performed at [facility]    Report Status PENDING  Incomplete     Studies: Dg Chest 2 View  10/29/2014   CLINICAL DATA:  Bilateral ankle pain and neck pain. Fever. Weakness.  EXAM: CHEST  2 VIEW  COMPARISON:  10/27/2014  FINDINGS: The patient' s chin and facial tissues obscure parts of the lung apices.  Tortuous and atherosclerotic thoracic aorta. Mild enlargement of the cardiopericardial silhouette  Indistinct vasculature with faint interstitial accentuation in the right upper lobe. Suspected mild atelectasis along the left lung base.  Degenerative bilateral glenohumeral arthropathy.  IMPRESSION: 1. Mild enlargement of the cardiopericardial silhouette with indistinct pulmonary vasculature potentially read a representing pulmonary venous hypertension. Equivocal interstitial accentuation in the right upper lobe. 2. Mild atelectasis, left lung base. 3. No overt consolidation or overt pulmonary edema. 4. Tortuous and atherosclerotic aortic arch probably  contributing to the rightward deviation of the trachea.   Electronically Signed   By: Sherryl Barters M.D.   On: 10/29/2014 18:01   Dg Cervical Spine 2 Or 3 Views  10/29/2014   CLINICAL DATA:  Neck pain and fever  EXAM: CERVICAL SPINE - 2-3 VIEW  COMPARISON:  Cervical spine series June 27, 2007 and cervical MRI August 17, 2011  FINDINGS: Frontal and lateral views were obtained. There is no fracture or spondylolisthesis. Prevertebral soft tissues and predental space regions are normal. There is moderately severe disc space narrowing at all levels. There are anterior osteophytes at all levels. There are posterior osteophytes at all levels except for C2. There is no erosive change or bony destruction. There is reversal of the lordotic curvature.  IMPRESSION: Extensive multilevel osteoarthritic change. No fracture or spondylolisthesis. Reversal of the lordotic curvature is probably due to muscle spasm. There has been overall progression of osteoarthritic change compared to prior studies.   Electronically Signed   By: Lowella Grip M.D.   On: 10/29/2014 17:42   Dg Ankle 2 Views Left  10/29/2014   CLINICAL DATA:  Bilateral ankle pain, recent refusal to walk. Fever and weakness.  EXAM: LEFT ANKLE - 2 VIEW  COMPARISON:  05/15/2007  FINDINGS: Diffuse bony demineralization. Vascular calcifications noted. No fracture. No soft tissue swelling over the malleolus. Talar dome appears intact.  IMPRESSION: 1. No fracture or acute bony findings identified. 2. Sensitivity is significantly reduced due to the severity of bony demineralization. If there is a high index of suspicion of occult bony injury, MRI of the ankle would be suggested.   Electronically Signed   By: Sherryl Barters M.D.   On: 10/29/2014 17:56   Dg Ankle 2 Views Right  10/29/2014   CLINICAL DATA:  Bilateral ankle pain. Fever. Patient refuses to walk.  EXAM: RIGHT ANKLE - 2 VIEW  COMPARISON:  None.  FINDINGS: Bony demineralization. Vascular  calcifications. No acute bony findings observed. Degenerative spurring along the posterior subtalar facet.  IMPRESSION: 1. No acute bony findings. 2. Bony demineralization. 3. Degenerative findings along the posterior subtalar facet. 4. Vascular calcifications.   Electronically Signed   By: Sherryl Barters M.D.   On: 10/29/2014 17:58    Scheduled Meds: . budesonide (PULMICORT) nebulizer solution  0.25 mg Nebulization BID  .  ceFAZolin (ANCEF) IV  1 g Intravenous Q8H  . feeding supplement (ENSURE COMPLETE)  237 mL Oral BID BM  . fentaNYL  25 mcg Transdermal Q72H  . ferrous sulfate  325 mg Oral TID WC  . gabapentin  300 mg Oral TID  . guaiFENesin  600 mg Oral BID  . ipratropium-albuterol  3 mL Nebulization BID  . pantoprazole  40 mg Oral BID  . saccharomyces boulardii  250 mg Oral BID  . sertraline  75 mg Oral Daily  . sodium chloride  3 mL Intravenous Q12H   Continuous Infusions:   Principal Problem:   HCAP (healthcare-associated pneumonia) Active Problems:   Essential hypertension   Diastolic dysfunction   Chronic pain syndrome   Encephalopathy acute   Bacteremia    Time spent: 25 minutes    Barton Dubois  [facility] Pager [phone]. If 7PM-7AM, please contact night-coverage at [email or web address], password [facility] 10/30/2014, 2:07 PM  LOS: 11 days

## 2014-10-30 NOTE — Progress Notes (Signed)
Physical Therapy Treatment Patient Details Name: [name] MRN: [id number] DOB: 28-Apr-1925 Today's Date: 10/30/2014    History of Present Illness 81 yo female admitted with Pna, AMS, fever. Hx of t11-T12 laminotomy-2014, HTn, CVA, spinal stenosis, atrial tachycardia, chroni pain-opiods.     PT Comments    Pt sitting EOB as NT just got her from [facility] to bed.  Attempted amb however pt was unable to support self and unable to initiate steppage.  Pt started "skiing".  C/o MAX B LE pain but unable to express "where" (feet/knees/hips).  Pt assisted to recliner + 2 total assist pt 0%.  Reported to RN that pt demon increased AMS and was amb last Friday with assist.   Follow Up Recommendations  SNF     Equipment Recommendations       Recommendations for Other Services       Precautions / Restrictions Precautions Precautions: Fall Precaution Comments: AMS Restrictions Weight Bearing Restrictions: No    Mobility  Bed Mobility               General bed mobility comments: Pt sitting EOB on arrival  Transfers Overall transfer level: Needs assistance Equipment used: Rolling walker (2 wheeled) Transfers: Sit to/from Stand Sit to Stand: +2 safety/equipment;+2 physical assistance;Total assist (pt 0%)         General transfer comment: attempted sit to stand + 2 total assist.  Pt unable to to functional support self and demon B LE extension "skiing".  Pt was unable to weight shift and no initiation to advance either LE.  C/O MAX B LE pain but unable to extress "where" (feet/knees/hips?) Recliner brought behind to pt.    Ambulation/Gait Ambulation/Gait assistance: +2 physical assistance;+2 safety/equipment;Total assist (pt 0%)   Assistive device: Rolling walker (2 wheeled)       General Gait Details: Pt was unable to weight shift and no initiation to advance either LE.  C/O MAX B LE pain but unable to extress "where" (feet/knees/hips?) Recliner brought behind to pt.      Stairs            Wheelchair Mobility    Modified Rankin (Stroke Patients Only)       Balance                                    Cognition Arousal/Alertness: Awake/alert Behavior During Therapy: Flat affect                        Exercises      General Comments        Pertinent Vitals/Pain Pain Assessment: Faces Faces Pain Scale: Hurts even more Pain Location: B LE pain but unable to communicate where/how much    Home Living                      Prior Function            PT Goals (current goals can now be found in the care plan section) Progress towards PT goals: Progressing toward goals    Frequency  Min 2X/week    PT Plan      Co-evaluation             End of Session Equipment Utilized During Treatment: Gait belt Activity Tolerance: Other (comment) (AMS)       Time: 9485-4627 PT Time Calculation (min) (ACUTE ONLY):  12 min  Charges:  $Therapeutic Activity: 8-22 mins                    G Codes:      Rica Koyanagi  PTA [facility]  Acute  Rehab Pager      [phone]

## 2014-10-30 NOTE — Consult Note (Signed)
[facility]    Date of Admission:  10/19/2014   Total days of antibiotics 10        Day 8 cefazolin               Reason for Consult: Recurrent fever on therapy for healthcare associated pneumonia complicated by coagulase-negative staph bacteremia    Referring Physician: Dr. Barton Dubois   Principal Problem:   HCAP (healthcare-associated pneumonia) Active Problems:   Coagulase negative Staphylococcus bacteremia   Essential hypertension   Diastolic dysfunction   Chronic pain syndrome   Encephalopathy acute   . budesonide (PULMICORT) nebulizer solution  0.25 mg Nebulization BID  .  ceFAZolin (ANCEF) IV  1 g Intravenous Q8H  . feeding supplement (ENSURE COMPLETE)  237 mL Oral BID BM  . fentaNYL  25 mcg Transdermal Q72H  . ferrous sulfate  325 mg Oral TID WC  . gabapentin  300 mg Oral TID  . guaiFENesin  600 mg Oral BID  . ipratropium-albuterol  3 mL Nebulization BID  . pantoprazole  40 mg Oral BID  . saccharomyces boulardii  250 mg Oral BID  . sertraline  75 mg Oral Daily  . sodium chloride  3 mL Intravenous Q12H    Recommendations: 1. Continue cefazolin 2. Await results of repeat blood cultures   Assessment: There is no obvious source for her recurrent fevers. Her chest x-ray is improved and repeat blood cultures have been negative. She is not reported to have any recent diarrhea and a C. difficile PCR last week was negative. Her urinalysis showed only 3-6 white blood cells. I do not see any rash or other signs of an allergic reaction. Blood cultures were repeated this morning. I will continue cefazolin for now and follow-up tomorrow.    HPI: [name] is a 81 y.o. female skilled nursing facility resident who was admitted on 10/19/2014 after she developed fever and sudden decline in mental status. Chest x-ray revealed acute by basilar, patchy infiltrates and she was started on empiric vancomycin and cefepime for HCAP. Both  admission blood cultures grew methicillin sensitive coagulase-negative staph. She was switched to IV cefazolin. She defervesced but her fevers have returned over the past few days. Her nurse reports that her mental status has waxed and waned. It is uncertain what her baseline mental status is. She has been incontinent of urine. She is not having any diarrhea. She has an uninfected stage II pressure sore on her sacrum.   Review of Systems: Review of systems not obtained due to patient factors.  Past Medical History  Diagnosis Date  . Hypertension   . Head ache   . Renal insufficiency, mild   . Depression   . Fecal occult blood test positive   . Restless leg syndrome   . Anemia   . Cataracts, bilateral   . Hemorrhoids   . Rupture of rotator cuff of shoulder     s/p repair by Dr. Berenice Primas 7/07. Follows with Dr. Tamera Punt for possible shoulder surgery.   . Lumbar spinal stenosis     gets Spinal injections by Dr. Marlaine Hind   . Postnasal drip   . Atrial tachycardia     echo 04/08/11: EF 55-60%, mild LVH, grade 1 diast dysfxn;    s/p RFCA with Dr. Lovena Le  04/09/11  . Hyperkalemia 04/08/2011  . Unspecified deficiency anemia   . Osteoporosis, unspecified 04/09/2013    DEXA 5/07 : T  L forearm -2.9. L femur -0.6, R femur -1.4. [name]  . CVA (cerebral infarction) 04/09/2013    MRI 2005 : Multiple tiny old lacunar infarcts as well as scattered white matter small vessel ischemic disease. Pt asymptomatic and denies TIA / CVA hx.    . Chronic pain syndrome 04/09/2013    On chronic opioids. Cervical MRI 11/ 2012 : Diffuse cervical spondylosis, Diffuse degenerative disc disease with multilevel foraminal stenosis. Possible T1 nerve root involvement. Lumbar MRI 2009 : prior fusion at L3-4 and degenerative disc disease Rotator cuff repair : Dr. Berenice Primas 7/07. Follows with Dr. Tamera Punt for possible shoulder surgery.  H/O lumbar spinal stenosis with steroid injections. Mo  . Arthritis     b/l  shoulder R>L  . Rotator cuff tear arthropathy     03/22/13 dx [facility] (Dr. Tamera Punt) considering surgery  . Stroke   . History of blood transfusion 2014    anemia-     History  Substance Use Topics  . Smoking status: Never Smoker   . Smokeless tobacco: Not on file  . Alcohol Use: No    Family History  Problem Relation Age of Onset  . Coronary artery disease Neg Hx     premature   Allergies  Allergen Reactions  . Codeine     Hallucinations  . Ibuprofen     Confusion    OBJECTIVE: Blood pressure 147/101, pulse 86, temperature 100.9 F (38.3 C), temperature source Axillary, resp. rate 15, height 5\' 3"  (1.6 m), weight 127 lb 6.8 oz (57.8 kg), SpO2 97 %. General: She is sitting up in a chair. She is very lethargic and does not respond to questions Skin: No rash. Former and current left hand IV sites appear normal Neck: Supple Oral: No obvious oropharyngeal lesions. Multiple missing teeth Lungs: Coarse upper airway noise Cor: Distant heart sounds but regular S1 and S2 no murmurs heard Abdomen: Soft and not obviously tender Joints and extremities: No acute abnormalities  Lab Results Lab Results  Component Value Date   WBC 14.9* 10/30/2014   HGB 7.9* 10/30/2014   HCT 25.0* 10/30/2014   MCV 89.3 10/30/2014   PLT 379 10/30/2014    Lab Results  Component Value Date   CREATININE 0.80 10/29/2014   BUN 25* 10/29/2014   NA 137 10/29/2014   K 4.1 10/29/2014   CL 105 10/29/2014   CO2 27 10/29/2014    Lab Results  Component Value Date   ALT 20 10/19/2014   AST 73* 10/19/2014   ALKPHOS 139* 10/19/2014   BILITOT 0.7 10/19/2014     Microbiology: Recent Results (from the past 240 hour(s))  Gram stain     Status: None   Collection Time: 10/23/14  6:19 AM  Result Value Ref Range Status   Specimen Description TRACHEAL SITE  Final   Special Requests NONE  Final   Gram Stain   Final    ABUNDANT WBC PRESENT, PREDOMINANTLY PMN MODERATE SQUAMOUS EPITHELIAL  CELLS PRESENT RARE YEAST Performed at [facility]    Report Status 10/23/2014 FINAL  Final  Culture, blood (routine x 2)     Status: None   Collection Time: 10/23/14  5:15 PM  Result Value Ref Range Status   Specimen Description BLOOD LEFT HAND  Final   Special Requests BOTTLES DRAWN AEROBIC ONLY  3 CC  Final   Culture   Final    NO GROWTH 5 DAYS Performed at [facility]  Report Status 10/30/2014 FINAL  Final  Culture, blood (routine x 2)     Status: None   Collection Time: 10/23/14  5:19 PM  Result Value Ref Range Status   Specimen Description BLOOD RIGHT HAND  Final   Special Requests BOTTLES DRAWN AEROBIC ONLY 3 CC  Final   Culture   Final    NO GROWTH 5 DAYS Performed at [facility]    Report Status 10/30/2014 FINAL  Final  Clostridium Difficile by PCR     Status: None   Collection Time: 10/24/14  4:24 PM  Result Value Ref Range Status   C difficile by pcr NEGATIVE NEGATIVE Final    Comment: Performed at [facility]  Culture, Urine     Status: None   Collection Time: 10/28/14 12:06 PM  Result Value Ref Range Status   Specimen Description URINE, CLEAN CATCH  Final   Special Requests NONE  Final   Colony Count   Final    25,000 COLONIES/ML Performed at [facility]    Culture   Final    Multiple bacterial morphotypes present, none predominant. Suggest appropriate recollection if clinically indicated. Performed at [facility]    Report Status 10/29/2014 FINAL  Final  Culture, blood (routine x 2)     Status: None (Preliminary result)   Collection Time: 10/29/14  4:35 PM  Result Value Ref Range Status   Specimen Description BLOOD RIGHT HAND  Final   Special Requests BOTTLES DRAWN AEROBIC ONLY 3 CC  Final   Culture   Final           BLOOD CULTURE RECEIVED NO GROWTH TO DATE CULTURE WILL BE HELD FOR 5 DAYS BEFORE ISSUING A FINAL NEGATIVE REPORT Performed at [facility]    Report Status PENDING  Incomplete    Culture, blood (routine x 2)     Status: None (Preliminary result)   Collection Time: 10/29/14  4:40 PM  Result Value Ref Range Status   Specimen Description BLOOD RIGHT HAND  Final   Special Requests BOTTLES DRAWN AEROBIC ONLY  3 CC  Final   Culture   Final           BLOOD CULTURE RECEIVED NO GROWTH TO DATE CULTURE WILL BE HELD FOR 5 DAYS BEFORE ISSUING A FINAL NEGATIVE REPORT Performed at [facility]    Report Status PENDING  Incomplete    Michel Bickers, MD [facility] [phone] pager   [phone] cell 10/30/2014, 3:19 PM

## 2014-10-31 DIAGNOSIS — B9689 Other specified bacterial agents as the cause of diseases classified elsewhere: Secondary | ICD-10-CM

## 2014-10-31 LAB — URIC ACID: Uric Acid, Serum: 3.2 mg/dL (ref 2.4–7.0)

## 2014-10-31 MED ORDER — LIP MEDEX EX OINT
TOPICAL_OINTMENT | CUTANEOUS | Status: AC
  Filled 2014-10-31: qty 7

## 2014-10-31 MED ORDER — ACETAMINOPHEN 650 MG RE SUPP
650.0000 mg | RECTAL | Status: DC | PRN
  Administered 2014-10-31: 650 mg via RECTAL
  Filled 2014-10-31: qty 1

## 2014-10-31 MED ORDER — ACETAMINOPHEN 325 MG PO TABS
650.0000 mg | ORAL_TABLET | Freq: Four times a day (QID) | ORAL | Status: DC | PRN
  Administered 2014-10-31: 650 mg via ORAL
  Filled 2014-10-31 (×2): qty 2

## 2014-10-31 NOTE — H&P (Signed)
[facility] PROGRESS NOTE Interim History: 81 year old female with history of hypertension, CVA, chronic kidney disease and chronic pain was admitted with healthcare associated pneumonia and acute encephalopathy. Hospital course completed due to bacteremia (2/2 coagulase -negative staph aureus. ). Patient still having temperature spikes and on antibiotic awaiting to be afebrile for 24 hours before PICC placed for IV antibiotics as an outpatient and discharged to [facility].   Assessment/Plan: Coagulase negative Staphylococcus bacteremia/HCAP (healthcare-associated pneumonia) - She continues to have fever despite IV antibiotics her low blood cell count continues to be elevated.Marland Kitchen She was initially on vancomycin and cefepime de escalated to Keflex on 10/23/2014. -  2-D echo showed no sign of agitation.  - Repeat a chest x-ray showed no signs of worsening pneumonia UA was unremarkable.  - She is bilateral lower extremity pain on her ankles check a uric acid.  Encephalopathy acute - Is now resolved, it was most likely due to infectious etiology.   Chronic pain syndrome - Patient continues to have intermittent leg and neck pain, x-ray shows no acute abnormalities. - I will go ahead and check a uric acid.  Chronic diastolic heart failure: - Appears to be euvolemic.  Iron deficiency anemia: - Vital signs of overt bleeding, for which he was positive, she has received 2 units of packed red blood cells. - Family refused any further GI evaluation. Continue PPI twice a day.  Diarrhea: Stool negative for C. difficile continue for store.     Code Status: full Family Communication: none at bedside  Disposition Plan: SNF when PICC line placed   Consultants:  ID  Procedures:  CXR  Antibiotics:  Vanc and cefepime 1.10.16>> 1.14.16  Cefazolin 10/25/14 (with intentions to treat for 4 weeks) (indicate start date, and stop date if  known)  HPI/Subjective: Complaining of B/L leg pain  Objective: Filed Vitals:   10/31/14 0113 10/31/14 0246 10/31/14 0519 10/31/14 1030  BP:   152/72   Pulse:   82   Temp: 99.9 F (37.7 C)  99.3 F (37.4 C)   TempSrc:   Oral   Resp:   15   Height:      Weight:  60.5 kg (133 lb 6.1 oz)    SpO2:   100% 96%    Intake/Output Summary (Last 24 hours) at 10/31/14 1120 Last data filed at 10/31/14 0919  Gross per 24 hour  Intake    910 ml  Output    400 ml  Net    510 ml   Filed Weights   10/30/14 0130 10/30/14 0627 10/31/14 0246  Weight: 60.6 kg (133 lb 9.6 oz) 57.8 kg (127 lb 6.8 oz) 60.5 kg (133 lb 6.1 oz)    Exam:  General: Alert, awake, oriented x3, in no acute distress. cachectic HEENT: No bruits, no goiter.  Heart: Regular rate and rhythm.  Lungs: Good air movement, clear Abdomen: Soft, nontender, nondistended, positive bowel sounds.    Data Reviewed: Basic Metabolic Panel:  Recent Labs Lab 10/28/14 0500 10/29/14 0402  NA 137 137  K 3.9 4.1  CL 104 105  CO2 27 27  GLUCOSE 95 92  BUN 20 25*  CREATININE 0.81 0.80  CALCIUM 7.7* 7.7*   Liver Function Tests: No results for input(s): AST, ALT, ALKPHOS, BILITOT, PROT, ALBUMIN in the last 168 hours. No results for input(s): LIPASE, AMYLASE in the last 168 hours. No results for input(s): AMMONIA in the last 168 hours. CBC:  Recent Labs Lab 10/25/14 0536  10/26/14 1000 10/28/14 0500 10/29/14 0402 10/30/14 0706  WBC 14.2* 11.2* 18.0* 15.8* 14.9*  HGB 6.4* 8.8* 8.2* 7.6* 7.9*  HCT 19.8* 27.3* 26.2* 24.7* 25.0*  MCV 87.6 87.2 88.5 90.1 89.3  PLT 177 207 330 310 379   Cardiac Enzymes: No results for input(s): CKTOTAL, CKMB, CKMBINDEX, TROPONINI in the last 168 hours. BNP (last 3 results) No results for input(s): PROBNP in the last 8760 hours. CBG: No results for input(s): GLUCAP in the last 168 hours.  Recent Results (from the past 240 hour(s))  Gram stain     Status: None   Collection Time:  10/23/14  6:19 AM  Result Value Ref Range Status   Specimen Description TRACHEAL SITE  Final   Special Requests NONE  Final   Gram Stain   Final    ABUNDANT WBC PRESENT, PREDOMINANTLY PMN MODERATE SQUAMOUS EPITHELIAL CELLS PRESENT RARE YEAST Performed at [facility]    Report Status 10/23/2014 FINAL  Final  Culture, blood (routine x 2)     Status: None   Collection Time: 10/23/14  5:15 PM  Result Value Ref Range Status   Specimen Description BLOOD LEFT HAND  Final   Special Requests BOTTLES DRAWN AEROBIC ONLY  3 CC  Final   Culture   Final    NO GROWTH 5 DAYS Performed at [facility]    Report Status 10/30/2014 FINAL  Final  Culture, blood (routine x 2)     Status: None   Collection Time: 10/23/14  5:19 PM  Result Value Ref Range Status   Specimen Description BLOOD RIGHT HAND  Final   Special Requests BOTTLES DRAWN AEROBIC ONLY 3 CC  Final   Culture   Final    NO GROWTH 5 DAYS Performed at [facility]    Report Status 10/30/2014 FINAL  Final  Clostridium Difficile by PCR     Status: None   Collection Time: 10/24/14  4:24 PM  Result Value Ref Range Status   C difficile by pcr NEGATIVE NEGATIVE Final    Comment: Performed at [facility]  Culture, Urine     Status: None   Collection Time: 10/28/14 12:06 PM  Result Value Ref Range Status   Specimen Description URINE, CLEAN CATCH  Final   Special Requests NONE  Final   Colony Count   Final    25,000 COLONIES/ML Performed at [facility]    Culture   Final    Multiple bacterial morphotypes present, none predominant. Suggest appropriate recollection if clinically indicated. Performed at [facility]    Report Status 10/29/2014 FINAL  Final  Culture, blood (routine x 2)     Status: None (Preliminary result)   Collection Time: 10/29/14  4:35 PM  Result Value Ref Range Status   Specimen Description BLOOD RIGHT HAND  Final   Special Requests BOTTLES DRAWN AEROBIC ONLY 3 CC   Final   Culture   Final           BLOOD CULTURE RECEIVED NO GROWTH TO DATE CULTURE WILL BE HELD FOR 5 DAYS BEFORE ISSUING A FINAL NEGATIVE REPORT Performed at [facility]    Report Status PENDING  Incomplete  Culture, blood (routine x 2)     Status: None (Preliminary result)   Collection Time: 10/29/14  4:40 PM  Result Value Ref Range Status   Specimen Description BLOOD RIGHT HAND  Final   Special Requests BOTTLES DRAWN AEROBIC ONLY  3 CC  Final  Culture   Final           BLOOD CULTURE RECEIVED NO GROWTH TO DATE CULTURE WILL BE HELD FOR 5 DAYS BEFORE ISSUING A FINAL NEGATIVE REPORT Performed at [facility]    Report Status PENDING  Incomplete     Studies: Dg Chest 2 View  10/29/2014   CLINICAL DATA:  Bilateral ankle pain and neck pain. Fever. Weakness.  EXAM: CHEST  2 VIEW  COMPARISON:  10/27/2014  FINDINGS: The patient' s chin and facial tissues obscure parts of the lung apices.  Tortuous and atherosclerotic thoracic aorta. Mild enlargement of the cardiopericardial silhouette  Indistinct vasculature with faint interstitial accentuation in the right upper lobe. Suspected mild atelectasis along the left lung base.  Degenerative bilateral glenohumeral arthropathy.  IMPRESSION: 1. Mild enlargement of the cardiopericardial silhouette with indistinct pulmonary vasculature potentially read a representing pulmonary venous hypertension. Equivocal interstitial accentuation in the right upper lobe. 2. Mild atelectasis, left lung base. 3. No overt consolidation or overt pulmonary edema. 4. Tortuous and atherosclerotic aortic arch probably contributing to the rightward deviation of the trachea.   Electronically Signed   By: Sherryl Barters M.D.   On: 10/29/2014 18:01   Dg Cervical Spine 2 Or 3 Views  10/29/2014   CLINICAL DATA:  Neck pain and fever  EXAM: CERVICAL SPINE - 2-3 VIEW  COMPARISON:  Cervical spine series June 27, 2007 and cervical MRI August 17, 2011  FINDINGS:  Frontal and lateral views were obtained. There is no fracture or spondylolisthesis. Prevertebral soft tissues and predental space regions are normal. There is moderately severe disc space narrowing at all levels. There are anterior osteophytes at all levels. There are posterior osteophytes at all levels except for C2. There is no erosive change or bony destruction. There is reversal of the lordotic curvature.  IMPRESSION: Extensive multilevel osteoarthritic change. No fracture or spondylolisthesis. Reversal of the lordotic curvature is probably due to muscle spasm. There has been overall progression of osteoarthritic change compared to prior studies.   Electronically Signed   By: Lowella Grip M.D.   On: 10/29/2014 17:42   Dg Ankle 2 Views Left  10/29/2014   CLINICAL DATA:  Bilateral ankle pain, recent refusal to walk. Fever and weakness.  EXAM: LEFT ANKLE - 2 VIEW  COMPARISON:  05/15/2007  FINDINGS: Diffuse bony demineralization. Vascular calcifications noted. No fracture. No soft tissue swelling over the malleolus. Talar dome appears intact.  IMPRESSION: 1. No fracture or acute bony findings identified. 2. Sensitivity is significantly reduced due to the severity of bony demineralization. If there is a high index of suspicion of occult bony injury, MRI of the ankle would be suggested.   Electronically Signed   By: Sherryl Barters M.D.   On: 10/29/2014 17:56   Dg Ankle 2 Views Right  10/29/2014   CLINICAL DATA:  Bilateral ankle pain. Fever. Patient refuses to walk.  EXAM: RIGHT ANKLE - 2 VIEW  COMPARISON:  None.  FINDINGS: Bony demineralization. Vascular calcifications. No acute bony findings observed. Degenerative spurring along the posterior subtalar facet.  IMPRESSION: 1. No acute bony findings. 2. Bony demineralization. 3. Degenerative findings along the posterior subtalar facet. 4. Vascular calcifications.   Electronically Signed   By: Sherryl Barters M.D.   On: 10/29/2014 17:58    Scheduled  Meds: . budesonide (PULMICORT) nebulizer solution  0.25 mg Nebulization BID  .  ceFAZolin (ANCEF) IV  1 g Intravenous Q8H  . feeding supplement (ENSURE COMPLETE)  237 mL  Oral BID BM  . fentaNYL  25 mcg Transdermal Q72H  . ferrous sulfate  325 mg Oral TID WC  . gabapentin  300 mg Oral TID  . guaiFENesin  600 mg Oral BID  . ipratropium-albuterol  3 mL Nebulization BID  . lip balm      . pantoprazole  40 mg Oral BID  . saccharomyces boulardii  250 mg Oral BID  . sertraline  75 mg Oral Daily  . sodium chloride  3 mL Intravenous Q12H   Continuous Infusions:    Charlynne Cousins  [facility] Pager [phone]. If 8PM-8AM, please contact night-coverage at [email or web address], password [facility] 10/31/2014, 11:20 AM  LOS: 12 days

## 2014-10-31 NOTE — Progress Notes (Signed)
Upon initial afternoon temperature of 101.7, patient was iced down to help decrease body temperature. Patient did not have a PRN order for tylenol, therefore RN paged the MD. MD ordered 650 mg PO tylenol every 6 hours as needed for fever.   AT 1345, RN tried to give this medication to patient but patient would not open mouth to take medications.   MD notified about patient increasing temperature, decreased awareness, and refusing medications (tylenol).

## 2014-10-31 NOTE — Progress Notes (Signed)
Patient ID: [name], female   DOB: [birth year], 81 y.o.   MRN: [id number]         [facility]    Date of Admission:  10/19/2014   Total days of antibiotics 11        Day 9 cefazolin          Principal Problem:   HCAP (healthcare-associated pneumonia) Active Problems:   Coagulase negative Staphylococcus bacteremia   Essential hypertension   Diastolic dysfunction   Chronic pain syndrome   Encephalopathy acute   . budesonide (PULMICORT) nebulizer solution  0.25 mg Nebulization BID  .  ceFAZolin (ANCEF) IV  1 g Intravenous Q8H  . feeding supplement (ENSURE COMPLETE)  237 mL Oral BID BM  . fentaNYL  25 mcg Transdermal Q72H  . ferrous sulfate  325 mg Oral TID WC  . gabapentin  300 mg Oral TID  . guaiFENesin  600 mg Oral BID  . ipratropium-albuterol  3 mL Nebulization BID  . lip balm      . pantoprazole  40 mg Oral BID  . saccharomyces boulardii  250 mg Oral BID  . sertraline  75 mg Oral Daily  . sodium chloride  3 mL Intravenous Q12H    Subjective: She is complaining of leg and foot pain. She's not had a bowel movement in several days.  Review of Systems: Pertinent items are noted in HPI.  Past Medical History  Diagnosis Date  . Hypertension   . Head ache   . Renal insufficiency, mild   . Depression   . Fecal occult blood test positive   . Restless leg syndrome   . Anemia   . Cataracts, bilateral   . Hemorrhoids   . Rupture of rotator cuff of shoulder     s/p repair by Dr. Berenice Primas 7/07. Follows with Dr. Tamera Punt for possible shoulder surgery.   . Lumbar spinal stenosis     gets Spinal injections by Dr. Marlaine Hind   . Postnasal drip   . Atrial tachycardia     echo 04/08/11: EF 55-60%, mild LVH, grade 1 diast dysfxn;    s/p RFCA with Dr. Lovena Le  04/09/11  . Hyperkalemia 04/08/2011  . Unspecified deficiency anemia   . Osteoporosis, unspecified 04/09/2013    DEXA 5/07 : T L forearm -2.9. L femur -0.6, R femur -1.4. [name]   . CVA (cerebral infarction) 04/09/2013    MRI 2005 : Multiple tiny old lacunar infarcts as well as scattered white matter small vessel ischemic disease. Pt asymptomatic and denies TIA / CVA hx.    . Chronic pain syndrome 04/09/2013    On chronic opioids. Cervical MRI 11/ 2012 : Diffuse cervical spondylosis, Diffuse degenerative disc disease with multilevel foraminal stenosis. Possible T1 nerve root involvement. Lumbar MRI 2009 : prior fusion at L3-4 and degenerative disc disease Rotator cuff repair : Dr. Berenice Primas 7/07. Follows with Dr. Tamera Punt for possible shoulder surgery.  H/O lumbar spinal stenosis with steroid injections. Mo  . Arthritis     b/l shoulder R>L  . Rotator cuff tear arthropathy     03/22/13 dx [facility] (Dr. Tamera Punt) considering surgery  . Stroke   . History of blood transfusion 2014    anemia-     History  Substance Use Topics  . Smoking status: Never Smoker   . Smokeless tobacco: Not on file  . Alcohol Use: No    Family History  Problem Relation Age of  Onset  . Coronary artery disease Neg Hx     premature   Allergies  Allergen Reactions  . Codeine     Hallucinations  . Ibuprofen     Confusion    OBJECTIVE: Blood pressure 152/72, pulse 82, temperature 99.3 F (37.4 C), temperature source Oral, resp. rate 15, height 5\' 3"  (1.6 m), weight 133 lb 6.1 oz (60.5 kg), SpO2 96 %. General: She is much more alert today and interactive. She will answer some simple questions. Skin: No rash. IV site looks good. Lungs: Clear Cor: Regular S1 and S2 with no murmur Abdomen: Soft and nontender Extremities: Her left foot and ankle is slightly swollen compared to the right. She grimaces in pain with palpation of both feet and lower legs  Lab Results Lab Results  Component Value Date   WBC 14.9* 10/30/2014   HGB 7.9* 10/30/2014   HCT 25.0* 10/30/2014   MCV 89.3 10/30/2014   PLT 379 10/30/2014    Lab Results  Component Value Date   CREATININE 0.80 10/29/2014     BUN 25* 10/29/2014   NA 137 10/29/2014   K 4.1 10/29/2014   CL 105 10/29/2014   CO2 27 10/29/2014    Lab Results  Component Value Date   ALT 20 10/19/2014   AST 73* 10/19/2014   ALKPHOS 139* 10/19/2014   BILITOT 0.7 10/19/2014     Microbiology: Recent Results (from the past 240 hour(s))  Gram stain     Status: None   Collection Time: 10/23/14  6:19 AM  Result Value Ref Range Status   Specimen Description TRACHEAL SITE  Final   Special Requests NONE  Final   Gram Stain   Final    ABUNDANT WBC PRESENT, PREDOMINANTLY PMN MODERATE SQUAMOUS EPITHELIAL CELLS PRESENT RARE YEAST Performed at [facility]    Report Status 10/23/2014 FINAL  Final  Culture, blood (routine x 2)     Status: None   Collection Time: 10/23/14  5:15 PM  Result Value Ref Range Status   Specimen Description BLOOD LEFT HAND  Final   Special Requests BOTTLES DRAWN AEROBIC ONLY  3 CC  Final   Culture   Final    NO GROWTH 5 DAYS Performed at [facility]    Report Status 10/30/2014 FINAL  Final  Culture, blood (routine x 2)     Status: None   Collection Time: 10/23/14  5:19 PM  Result Value Ref Range Status   Specimen Description BLOOD RIGHT HAND  Final   Special Requests BOTTLES DRAWN AEROBIC ONLY 3 CC  Final   Culture   Final    NO GROWTH 5 DAYS Performed at [facility]    Report Status 10/30/2014 FINAL  Final  Clostridium Difficile by PCR     Status: None   Collection Time: 10/24/14  4:24 PM  Result Value Ref Range Status   C difficile by pcr NEGATIVE NEGATIVE Final    Comment: Performed at [facility]  Culture, Urine     Status: None   Collection Time: 10/28/14 12:06 PM  Result Value Ref Range Status   Specimen Description URINE, CLEAN CATCH  Final   Special Requests NONE  Final   Colony Count   Final    25,000 COLONIES/ML Performed at [facility]    Culture   Final    Multiple bacterial morphotypes present, none predominant. Suggest appropriate  recollection if clinically indicated. Performed at [facility]    Report  Status 10/29/2014 FINAL  Final  Culture, blood (routine x 2)     Status: None (Preliminary result)   Collection Time: 10/29/14  4:35 PM  Result Value Ref Range Status   Specimen Description BLOOD RIGHT HAND  Final   Special Requests BOTTLES DRAWN AEROBIC ONLY 3 CC  Final   Culture   Final           BLOOD CULTURE RECEIVED NO GROWTH TO DATE CULTURE WILL BE HELD FOR 5 DAYS BEFORE ISSUING A FINAL NEGATIVE REPORT Performed at [facility]    Report Status PENDING  Incomplete  Culture, blood (routine x 2)     Status: None (Preliminary result)   Collection Time: 10/29/14  4:40 PM  Result Value Ref Range Status   Specimen Description BLOOD RIGHT HAND  Final   Special Requests BOTTLES DRAWN AEROBIC ONLY  3 CC  Final   Culture   Final           BLOOD CULTURE RECEIVED NO GROWTH TO DATE CULTURE WILL BE HELD FOR 5 DAYS BEFORE ISSUING A FINAL NEGATIVE REPORT Performed at [facility]    Report Status PENDING  Incomplete    Assessment: I'm not actually sure what is causing her recurrent fever. Her chest x-ray 2 days ago looked improved and repeat blood cultures are negative. She has no convincing evidence of a urinary tract infection, IV site infection or drug allergy and is not having any diarrhea. She has a normal uric acid but I have some concern that she may have gout in her left foot. I think that her initial coagulase-negative staph bacteremia and pneumonia have probably been treated appropriately. I will stop cefazolin and observe off of antibiotics for now.  Plan: 1. Discontinue cefazolin and observe off of antibiotics  Michel Bickers, MD [facility] [place] [phone] pager   [phone] cell 10/31/2014, 11:55 AM

## 2014-10-31 NOTE — Progress Notes (Signed)
Clinical Social Work  CSW spoke with RN who reports patient is not medically stable to DC today. CSW updated [facility] who remains agreeable to accept whenever stable and will accept weekend admissions. CSW will continue to follow.  Sindy Messing, LCSW (Coverage for [employer])

## 2014-11-01 ENCOUNTER — Encounter: Payer: Self-pay | Admitting: Internal Medicine

## 2014-11-01 DIAGNOSIS — R502 Drug induced fever: Secondary | ICD-10-CM

## 2014-11-01 MED ORDER — COLCHICINE 0.6 MG PO TABS: 0.6000 mg | ORAL_TABLET | Freq: Two times a day (BID) | ORAL | Status: DC

## 2014-11-01 MED ORDER — PANTOPRAZOLE SODIUM 40 MG PO TBEC: 40.0000 mg | DELAYED_RELEASE_TABLET | Freq: Two times a day (BID) | ORAL | Status: DC

## 2014-11-01 MED ORDER — COLCHICINE 0.6 MG PO TABS: 1.2000 mg | ORAL_TABLET | Freq: Once | ORAL | Status: DC

## 2014-11-01 MED ORDER — INDOMETHACIN 50 MG PO CAPS
50.0000 mg | ORAL_CAPSULE | Freq: Three times a day (TID) | ORAL | Status: AC
  Administered 2014-11-01 – 2014-11-02 (×3): 50 mg via ORAL
  Filled 2014-11-01 (×3): qty 1

## 2014-11-01 NOTE — Progress Notes (Signed)
INFECTIOUS DISEASE PROGRESS NOTE  ID: [name] is a 81 y.o. female with  Principal Problem:   HCAP (healthcare-associated pneumonia) Active Problems:   Essential hypertension   Diastolic dysfunction   Chronic pain syndrome   Encephalopathy acute   Coagulase negative Staphylococcus bacteremia  Subjective: Tmax 102.9 yesterday afternoon.  Without complaints  Abtx:  Anti-infectives    Start     Dose/Rate Route Frequency Ordered Stop   10/23/14 2000  vancomycin (VANCOCIN) 500 mg in sodium chloride 0.9 % 100 mL IVPB  Status:  Discontinued     500 mg100 mL/hr over 60 Minutes Intravenous Every 12 hours 10/22/14 2340 10/23/14 1656   10/23/14 1800  ceFAZolin (ANCEF) IVPB 1 g/50 mL premix  Status:  Discontinued     1 g100 mL/hr over 30 Minutes Intravenous Every 8 hours 10/23/14 1711 10/31/14 1159   10/23/14 0800  vancomycin (VANCOCIN) IVPB 750 mg/150 ml premix     750 mg150 mL/hr over 60 Minutes Intravenous  Once 10/22/14 2340 10/23/14 1018   10/22/14 2200  vancomycin (VANCOCIN) 500 mg in sodium chloride 0.9 % 100 mL IVPB  Status:  Discontinued     500 mg100 mL/hr over 60 Minutes Intravenous Every 12 hours 10/22/14 1235 10/22/14 2340   10/22/14 1500  ceFEPIme (MAXIPIME) 2 g in dextrose 5 % 50 mL IVPB  Status:  Discontinued     2 g100 mL/hr over 30 Minutes Intravenous Every 24 hours 10/22/14 1235 10/23/14 1656   10/22/14 1400  amoxicillin-clavulanate (AUGMENTIN) 500-125 MG per tablet 500 mg  Status:  Discontinued     1 tablet Oral 3 times per day 10/22/14 1142 10/22/14 1144   10/21/14 1500  ceFEPIme (MAXIPIME) 2 g in dextrose 5 % 50 mL IVPB  Status:  Discontinued     2 g100 mL/hr over 30 Minutes Intravenous Every 24 hours 10/21/14 1321 10/22/14 1142   10/20/14 1500  ceFEPIme (MAXIPIME) 2 g in dextrose 5 % 50 mL IVPB     2 g100 mL/hr over 30 Minutes Intravenous  Once 10/20/14 1408 10/20/14 1543   10/20/14 1000  vancomycin (VANCOCIN) 500 mg in sodium chloride 0.9 % 100 mL IVPB  Status:   Discontinued     500 mg100 mL/hr over 60 Minutes Intravenous Every 12 hours 10/19/14 2044 10/22/14 1142   10/19/14 2345  aztreonam (AZACTAM) 2 g in dextrose 5 % 50 mL IVPB  Status:  Discontinued     2 g100 mL/hr over 30 Minutes Intravenous 3 times per day 10/19/14 2330 10/20/14 1408   10/19/14 2100  vancomycin (VANCOCIN) IVPB 1000 mg/200 mL premix  Status:  Discontinued     1,000 mg200 mL/hr over 60 Minutes Intravenous  Once 10/19/14 2044 10/19/14 2330   10/19/14 2030  ceFEPIme (MAXIPIME) 2 g in dextrose 5 % 50 mL IVPB     2 g100 mL/hr over 30 Minutes Intravenous  Once 10/19/14 2027 10/19/14 2200   10/19/14 2030  azithromycin (ZITHROMAX) 500 mg in dextrose 5 % 250 mL IVPB     500 mg250 mL/hr over 60 Minutes Intravenous  Once 10/19/14 2027 10/19/14 2259      Medications:  Scheduled: . budesonide (PULMICORT) nebulizer solution  0.25 mg Nebulization BID  . feeding supplement (ENSURE COMPLETE)  237 mL Oral BID BM  . fentaNYL  25 mcg Transdermal Q72H  . ferrous sulfate  325 mg Oral TID WC  . gabapentin  300 mg Oral TID  . guaiFENesin  600 mg Oral BID  .  indomethacin  50 mg Oral TID WC  . ipratropium-albuterol  3 mL Nebulization BID  . pantoprazole  40 mg Oral BID  . saccharomyces boulardii  250 mg Oral BID  . sertraline  75 mg Oral Daily  . sodium chloride  3 mL Intravenous Q12H    Objective: Vital signs in last 24 hours: Temp:  [99 F (37.2 C)-102.9 F (39.4 C)] 99.3 F (37.4 C) (01/23 0533) Pulse Rate:  [88-93] 88 (01/23 0533) Resp:  [16] 16 (01/23 0533) BP: (133-148)/(71-73) 133/71 mmHg (01/23 0533) SpO2:  [95 %-99 %] 99 % (01/23 1057) Weight:  [57.7 kg (127 lb 3.3 oz)] 57.7 kg (127 lb 3.3 oz) (01/23 0533)   General appearance: alert, cooperative and no distress Resp: clear to auscultation bilaterally Cardio: regular rate and rhythm GI: normal findings: bowel sounds normal and soft, non-tender Extremities: no cordis is UE or LE. no erythema at LUE peripheral IV site.    Lab Results  Recent Labs  10/30/14 0706  WBC 14.9*  HGB 7.9*  HCT 25.0*   Liver Panel No results for input(s): PROT, ALBUMIN, AST, ALT, ALKPHOS, BILITOT, BILIDIR, IBILI in the last 72 hours. Sedimentation Rate No results for input(s): ESRSEDRATE in the last 72 hours. C-Reactive Protein No results for input(s): CRP in the last 72 hours.  Microbiology: Recent Results (from the past 240 hour(s))  Gram stain     Status: None   Collection Time: 10/23/14  6:19 AM  Result Value Ref Range Status   Specimen Description TRACHEAL SITE  Final   Special Requests NONE  Final   Gram Stain   Final    ABUNDANT WBC PRESENT, PREDOMINANTLY PMN MODERATE SQUAMOUS EPITHELIAL CELLS PRESENT RARE YEAST Performed at [facility]    Report Status 10/23/2014 FINAL  Final  Culture, blood (routine x 2)     Status: None   Collection Time: 10/23/14  5:15 PM  Result Value Ref Range Status   Specimen Description BLOOD LEFT HAND  Final   Special Requests BOTTLES DRAWN AEROBIC ONLY  3 CC  Final   Culture   Final    NO GROWTH 5 DAYS Performed at [facility]    Report Status 10/30/2014 FINAL  Final  Culture, blood (routine x 2)     Status: None   Collection Time: 10/23/14  5:19 PM  Result Value Ref Range Status   Specimen Description BLOOD RIGHT HAND  Final   Special Requests BOTTLES DRAWN AEROBIC ONLY 3 CC  Final   Culture   Final    NO GROWTH 5 DAYS Performed at [facility]    Report Status 10/30/2014 FINAL  Final  Clostridium Difficile by PCR     Status: None   Collection Time: 10/24/14  4:24 PM  Result Value Ref Range Status   C difficile by pcr NEGATIVE NEGATIVE Final    Comment: Performed at [facility]  Culture, Urine     Status: None   Collection Time: 10/28/14 12:06 PM  Result Value Ref Range Status   Specimen Description URINE, CLEAN CATCH  Final   Special Requests NONE  Final   Colony Count   Final    25,000 COLONIES/ML Performed at [facility]    Culture   Final    Multiple bacterial morphotypes present, none predominant. Suggest appropriate recollection if clinically indicated. Performed at [facility]    Report Status 10/29/2014 FINAL  Final  Culture, blood (routine x 2)  Status: None (Preliminary result)   Collection Time: 10/29/14  4:35 PM  Result Value Ref Range Status   Specimen Description BLOOD RIGHT HAND  Final   Special Requests BOTTLES DRAWN AEROBIC ONLY 3 CC  Final   Culture   Final           BLOOD CULTURE RECEIVED NO GROWTH TO DATE CULTURE WILL BE HELD FOR 5 DAYS BEFORE ISSUING A FINAL NEGATIVE REPORT Performed at [facility]    Report Status PENDING  Incomplete  Culture, blood (routine x 2)     Status: None (Preliminary result)   Collection Time: 10/29/14  4:40 PM  Result Value Ref Range Status   Specimen Description BLOOD RIGHT HAND  Final   Special Requests BOTTLES DRAWN AEROBIC ONLY  3 CC  Final   Culture   Final           BLOOD CULTURE RECEIVED NO GROWTH TO DATE CULTURE WILL BE HELD FOR 5 DAYS BEFORE ISSUING A FINAL NEGATIVE REPORT Performed at [facility]    Report Status PENDING  Incomplete    Studies/Results: No results found.   Assessment/Plan: MSSE bacteremia HCAP Fever  Total days of antibiotics:13 days, last ancef , off since 1-22  Fever from ancef? Will watch.  Her Uric acid was not elevated, though this does not r/o gout.  zoloft present on admission. Haldol new         Bobby Rumpf Infectious Diseases (pager) [phone] [email or web address] 11/01/2014, 2:39 PM  LOS: 13 days

## 2014-11-01 NOTE — H&P (Signed)
[facility] PROGRESS NOTE Interim History: 81 year old female with history of hypertension, CVA, chronic kidney disease and chronic pain was admitted with healthcare associated pneumonia and acute encephalopathy. Hospital course completed due to bacteremia (2/2 coagulase -negative staph aureus. ). Patient still having temperature spikes and on antibiotic awaiting to be afebrile for 24 hours before PICC placed for IV antibiotics as an outpatient and discharged to [facility].   Assessment/Plan: Coagulase negative Staphylococcus bacteremia/HCAP (healthcare-associated pneumonia) - Antibiotics stop on 1.22.2016. Had a mild temp of 100.6 improved from previous. WBC cont to be elevated. - 2-D echo showed no sign of agitation.  - Uric acid is low, no history of gout. Pain very typical will start indomethacin empirically and PPI. Cont florastor. - no history of PUD and renal function with GFR > 60.  Encephalopathy acute - Is now resolved, it was most likely due to infectious etiology.   Chronic pain syndrome - Patient continues to have intermittent leg and neck pain, x-ray shows no acute abnormalities. - ? Due to acute gout flare.  Chronic diastolic heart failure: - Appears to be euvolemic.  Iron deficiency anemia: - Vital signs of overt bleeding, for which he was positive, she has received 2 units of packed red blood cells. - Family refused any further GI evaluation. Continue PPI twice a day.  Diarrhea: Stool negative for C. difficile continue for store.     Code Status: full Family Communication: none at bedside  Disposition Plan: SNF when PICC line placed   Consultants:  ID  Procedures:  CXR  Antibiotics:  Vanc and cefepime 1.10.16>> 1.14.16  Cefazolin 10/25/14 (with intentions to treat for 4 weeks) (indicate start date, and stop date if known)  HPI/Subjective: Complaining of B/L leg pain  Objective: Filed Vitals:   10/31/14 1932  10/31/14 2143 11/01/14 0219 11/01/14 0533  BP:  148/73  133/71  Pulse:  93  88  Temp:  100.6 F (38.1 C) 99 F (37.2 C) 99.3 F (37.4 C)  TempSrc:  Oral Oral Oral  Resp:  16  16  Height:      Weight:    57.7 kg (127 lb 3.3 oz)  SpO2: 95% 98%  99%    Intake/Output Summary (Last 24 hours) at 11/01/14 0808 Last data filed at 10/31/14 2200  Gross per 24 hour  Intake    820 ml  Output    400 ml  Net    420 ml   Filed Weights   10/30/14 0627 10/31/14 0246 11/01/14 0533  Weight: 57.8 kg (127 lb 6.8 oz) 60.5 kg (133 lb 6.1 oz) 57.7 kg (127 lb 3.3 oz)    Exam:  General: Alert, awake, oriented x3, in no acute distress. cachectic HEENT: No bruits, no goiter.  Heart: Regular rate and rhythm.  Lungs: Good air movement, clear Abdomen: Soft, nontender, nondistended, positive bowel sounds.    Data Reviewed: Basic Metabolic Panel:  Recent Labs Lab 10/28/14 0500 10/29/14 0402  NA 137 137  K 3.9 4.1  CL 104 105  CO2 27 27  GLUCOSE 95 92  BUN 20 25*  CREATININE 0.81 0.80  CALCIUM 7.7* 7.7*   Liver Function Tests: No results for input(s): AST, ALT, ALKPHOS, BILITOT, PROT, ALBUMIN in the last 168 hours. No results for input(s): LIPASE, AMYLASE in the last 168 hours. No results for input(s): AMMONIA in the last 168 hours. CBC:  Recent Labs Lab 10/26/14 1000 10/28/14 0500 10/29/14 0402 10/30/14 0706  WBC 11.2* 18.0*  15.8* 14.9*  HGB 8.8* 8.2* 7.6* 7.9*  HCT 27.3* 26.2* 24.7* 25.0*  MCV 87.2 88.5 90.1 89.3  PLT 207 330 310 379   Cardiac Enzymes: No results for input(s): CKTOTAL, CKMB, CKMBINDEX, TROPONINI in the last 168 hours. BNP (last 3 results) No results for input(s): PROBNP in the last 8760 hours. CBG: No results for input(s): GLUCAP in the last 168 hours.  Recent Results (from the past 240 hour(s))  Gram stain     Status: None   Collection Time: 10/23/14  6:19 AM  Result Value Ref Range Status   Specimen Description TRACHEAL SITE  Final   Special  Requests NONE  Final   Gram Stain   Final    ABUNDANT WBC PRESENT, PREDOMINANTLY PMN MODERATE SQUAMOUS EPITHELIAL CELLS PRESENT RARE YEAST Performed at [facility]    Report Status 10/23/2014 FINAL  Final  Culture, blood (routine x 2)     Status: None   Collection Time: 10/23/14  5:15 PM  Result Value Ref Range Status   Specimen Description BLOOD LEFT HAND  Final   Special Requests BOTTLES DRAWN AEROBIC ONLY  3 CC  Final   Culture   Final    NO GROWTH 5 DAYS Performed at [facility]    Report Status 10/30/2014 FINAL  Final  Culture, blood (routine x 2)     Status: None   Collection Time: 10/23/14  5:19 PM  Result Value Ref Range Status   Specimen Description BLOOD RIGHT HAND  Final   Special Requests BOTTLES DRAWN AEROBIC ONLY 3 CC  Final   Culture   Final    NO GROWTH 5 DAYS Performed at [facility]    Report Status 10/30/2014 FINAL  Final  Clostridium Difficile by PCR     Status: None   Collection Time: 10/24/14  4:24 PM  Result Value Ref Range Status   C difficile by pcr NEGATIVE NEGATIVE Final    Comment: Performed at [facility]  Culture, Urine     Status: None   Collection Time: 10/28/14 12:06 PM  Result Value Ref Range Status   Specimen Description URINE, CLEAN CATCH  Final   Special Requests NONE  Final   Colony Count   Final    25,000 COLONIES/ML Performed at [facility]    Culture   Final    Multiple bacterial morphotypes present, none predominant. Suggest appropriate recollection if clinically indicated. Performed at [facility]    Report Status 10/29/2014 FINAL  Final  Culture, blood (routine x 2)     Status: None (Preliminary result)   Collection Time: 10/29/14  4:35 PM  Result Value Ref Range Status   Specimen Description BLOOD RIGHT HAND  Final   Special Requests BOTTLES DRAWN AEROBIC ONLY 3 CC  Final   Culture   Final           BLOOD CULTURE RECEIVED NO GROWTH TO DATE CULTURE WILL BE HELD FOR 5  DAYS BEFORE ISSUING A FINAL NEGATIVE REPORT Performed at [facility]    Report Status PENDING  Incomplete  Culture, blood (routine x 2)     Status: None (Preliminary result)   Collection Time: 10/29/14  4:40 PM  Result Value Ref Range Status   Specimen Description BLOOD RIGHT HAND  Final   Special Requests BOTTLES DRAWN AEROBIC ONLY  3 CC  Final   Culture   Final           BLOOD  CULTURE RECEIVED NO GROWTH TO DATE CULTURE WILL BE HELD FOR 5 DAYS BEFORE ISSUING A FINAL NEGATIVE REPORT Performed at [facility]    Report Status PENDING  Incomplete     Studies: No results found.  Scheduled Meds: . budesonide (PULMICORT) nebulizer solution  0.25 mg Nebulization BID  . feeding supplement (ENSURE COMPLETE)  237 mL Oral BID BM  . fentaNYL  25 mcg Transdermal Q72H  . ferrous sulfate  325 mg Oral TID WC  . gabapentin  300 mg Oral TID  . guaiFENesin  600 mg Oral BID  . ipratropium-albuterol  3 mL Nebulization BID  . pantoprazole  40 mg Oral BID  . saccharomyces boulardii  250 mg Oral BID  . sertraline  75 mg Oral Daily  . sodium chloride  3 mL Intravenous Q12H   Continuous Infusions:    Charlynne Cousins  [facility] Pager [phone]. If 8PM-8AM, please contact night-coverage at [email or web address], password [facility] 11/01/2014, 8:08 AM  LOS: 13 days

## 2014-11-01 NOTE — Progress Notes (Signed)
Sent text page to Dr Olevia Bowens re: pt's last BM on 1/16 & has no laxative ordered. Mariela Rex, [employer]

## 2014-11-02 DIAGNOSIS — Y95 Nosocomial condition: Secondary | ICD-10-CM

## 2014-11-02 MED ORDER — HYDROCODONE-ACETAMINOPHEN 10-325 MG PO TABS: 1.0000 | ORAL_TABLET | ORAL | Status: DC | PRN

## 2014-11-02 MED ORDER — GABAPENTIN 300 MG PO CAPS
600.0000 mg | ORAL_CAPSULE | Freq: Three times a day (TID) | ORAL | Status: DC
  Administered 2014-11-02 – 2014-11-05 (×9): 600 mg via ORAL
  Filled 2014-11-02 (×12): qty 2

## 2014-11-02 NOTE — Progress Notes (Signed)
INFECTIOUS DISEASE PROGRESS NOTE  ID: [name] is a 81 y.o. female with  Principal Problem:   HCAP (healthcare-associated pneumonia) Active Problems:   Essential hypertension   Diastolic dysfunction   Chronic pain syndrome   Encephalopathy acute   Coagulase negative Staphylococcus bacteremia  Subjective: C/o pain in both her LE from knees down.   Abtx:  Anti-infectives    Start     Dose/Rate Route Frequency Ordered Stop   10/23/14 2000  vancomycin (VANCOCIN) 500 mg in sodium chloride 0.9 % 100 mL IVPB  Status:  Discontinued     500 mg100 mL/hr over 60 Minutes Intravenous Every 12 hours 10/22/14 2340 10/23/14 1656   10/23/14 1800  ceFAZolin (ANCEF) IVPB 1 g/50 mL premix  Status:  Discontinued     1 g100 mL/hr over 30 Minutes Intravenous Every 8 hours 10/23/14 1711 10/31/14 1159   10/23/14 0800  vancomycin (VANCOCIN) IVPB 750 mg/150 ml premix     750 mg150 mL/hr over 60 Minutes Intravenous  Once 10/22/14 2340 10/23/14 1018   10/22/14 2200  vancomycin (VANCOCIN) 500 mg in sodium chloride 0.9 % 100 mL IVPB  Status:  Discontinued     500 mg100 mL/hr over 60 Minutes Intravenous Every 12 hours 10/22/14 1235 10/22/14 2340   10/22/14 1500  ceFEPIme (MAXIPIME) 2 g in dextrose 5 % 50 mL IVPB  Status:  Discontinued     2 g100 mL/hr over 30 Minutes Intravenous Every 24 hours 10/22/14 1235 10/23/14 1656   10/22/14 1400  amoxicillin-clavulanate (AUGMENTIN) 500-125 MG per tablet 500 mg  Status:  Discontinued     1 tablet Oral 3 times per day 10/22/14 1142 10/22/14 1144   10/21/14 1500  ceFEPIme (MAXIPIME) 2 g in dextrose 5 % 50 mL IVPB  Status:  Discontinued     2 g100 mL/hr over 30 Minutes Intravenous Every 24 hours 10/21/14 1321 10/22/14 1142   10/20/14 1500  ceFEPIme (MAXIPIME) 2 g in dextrose 5 % 50 mL IVPB     2 g100 mL/hr over 30 Minutes Intravenous  Once 10/20/14 1408 10/20/14 1543   10/20/14 1000  vancomycin (VANCOCIN) 500 mg in sodium chloride 0.9 % 100 mL IVPB  Status:   Discontinued     500 mg100 mL/hr over 60 Minutes Intravenous Every 12 hours 10/19/14 2044 10/22/14 1142   10/19/14 2345  aztreonam (AZACTAM) 2 g in dextrose 5 % 50 mL IVPB  Status:  Discontinued     2 g100 mL/hr over 30 Minutes Intravenous 3 times per day 10/19/14 2330 10/20/14 1408   10/19/14 2100  vancomycin (VANCOCIN) IVPB 1000 mg/200 mL premix  Status:  Discontinued     1,000 mg200 mL/hr over 60 Minutes Intravenous  Once 10/19/14 2044 10/19/14 2330   10/19/14 2030  ceFEPIme (MAXIPIME) 2 g in dextrose 5 % 50 mL IVPB     2 g100 mL/hr over 30 Minutes Intravenous  Once 10/19/14 2027 10/19/14 2200   10/19/14 2030  azithromycin (ZITHROMAX) 500 mg in dextrose 5 % 250 mL IVPB     500 mg250 mL/hr over 60 Minutes Intravenous  Once 10/19/14 2027 10/19/14 2259      Medications:  Scheduled: . budesonide (PULMICORT) nebulizer solution  0.25 mg Nebulization BID  . feeding supplement (ENSURE COMPLETE)  237 mL Oral BID BM  . fentaNYL  25 mcg Transdermal Q72H  . ferrous sulfate  325 mg Oral TID WC  . gabapentin  600 mg Oral TID  . guaiFENesin  600 mg  Oral BID  . ipratropium-albuterol  3 mL Nebulization BID  . pantoprazole  40 mg Oral BID  . saccharomyces boulardii  250 mg Oral BID  . sertraline  75 mg Oral Daily  . sodium chloride  3 mL Intravenous Q12H    Objective: Vital signs in last 24 hours: Temp:  [97.9 F (36.6 C)-99.6 F (37.6 C)] 99.6 F (37.6 C) (01/24 1336) Pulse Rate:  [73-79] 73 (01/24 1336) Resp:  [16] 16 (01/24 1336) BP: (122-144)/(43-87) 122/43 mmHg (01/24 1336) SpO2:  [95 %-100 %] 100 % (01/24 1336) Weight:  [58.6 kg (129 lb 3 oz)] 58.6 kg (129 lb 3 oz) (01/24 0558)   General appearance: alert, cooperative and no distress Resp: clear to auscultation bilaterally Cardio: regular rate and rhythm GI: normal findings: bowel sounds normal and soft, non-tender  Lab Results No results for input(s): WBC, HGB, HCT, NA, K, CL, CO2, BUN, CREATININE, GLU in the last 72  hours.  Invalid input(s): PLATELETS Liver Panel No results for input(s): PROT, ALBUMIN, AST, ALT, ALKPHOS, BILITOT, BILIDIR, IBILI in the last 72 hours. Sedimentation Rate No results for input(s): ESRSEDRATE in the last 72 hours. C-Reactive Protein No results for input(s): CRP in the last 72 hours.  Microbiology: Recent Results (from the past 240 hour(s))  Culture, blood (routine x 2)     Status: None   Collection Time: 10/23/14  5:15 PM  Result Value Ref Range Status   Specimen Description BLOOD LEFT HAND  Final   Special Requests BOTTLES DRAWN AEROBIC ONLY  3 CC  Final   Culture   Final    NO GROWTH 5 DAYS Performed at [facility]    Report Status 10/30/2014 FINAL  Final  Culture, blood (routine x 2)     Status: None   Collection Time: 10/23/14  5:19 PM  Result Value Ref Range Status   Specimen Description BLOOD RIGHT HAND  Final   Special Requests BOTTLES DRAWN AEROBIC ONLY 3 CC  Final   Culture   Final    NO GROWTH 5 DAYS Performed at [facility]    Report Status 10/30/2014 FINAL  Final  Clostridium Difficile by PCR     Status: None   Collection Time: 10/24/14  4:24 PM  Result Value Ref Range Status   C difficile by pcr NEGATIVE NEGATIVE Final    Comment: Performed at [facility]  Culture, Urine     Status: None   Collection Time: 10/28/14 12:06 PM  Result Value Ref Range Status   Specimen Description URINE, CLEAN CATCH  Final   Special Requests NONE  Final   Colony Count   Final    25,000 COLONIES/ML Performed at [facility]    Culture   Final    Multiple bacterial morphotypes present, none predominant. Suggest appropriate recollection if clinically indicated. Performed at [facility]    Report Status 10/29/2014 FINAL  Final  Culture, blood (routine x 2)     Status: None (Preliminary result)   Collection Time: 10/29/14  4:35 PM  Result Value Ref Range Status   Specimen Description BLOOD RIGHT HAND  Final   Special  Requests BOTTLES DRAWN AEROBIC ONLY 3 CC  Final   Culture   Final           BLOOD CULTURE RECEIVED NO GROWTH TO DATE CULTURE WILL BE HELD FOR 5 DAYS BEFORE ISSUING A FINAL NEGATIVE REPORT Performed at [facility]    Report Status PENDING  Incomplete  Culture, blood (routine x 2)     Status: None (Preliminary result)   Collection Time: 10/29/14  4:40 PM  Result Value Ref Range Status   Specimen Description BLOOD RIGHT HAND  Final   Special Requests BOTTLES DRAWN AEROBIC ONLY  3 CC  Final   Culture   Final           BLOOD CULTURE RECEIVED NO GROWTH TO DATE CULTURE WILL BE HELD FOR 5 DAYS BEFORE ISSUING A FINAL NEGATIVE REPORT Performed at [facility]    Report Status PENDING  Incomplete    Studies/Results: No results found.   Assessment/Plan: MSSE bacteremia HCAP Fever- resolved  Total days of antibiotics:13 days, last antibiotics ancef, off since 1-22  Appears to be doing well off anbx Will hold off on restarting her, she got nearly 2 weeks for her MSSE bacteremia.          Bobby Rumpf Infectious Diseases (pager) [phone] [email or web address] 11/02/2014, 2:45 PM  LOS: 14 days

## 2014-11-02 NOTE — Progress Notes (Signed)
[facility] PROGRESS NOTE Interim History: 81 year old female with history of hypertension, CVA, chronic kidney disease and chronic pain was admitted with healthcare associated pneumonia and acute encephalopathy. Hospital course completed due to bacteremia (2/2 coagulase -negative staph aureus. ). Patient still having temperature spikes and on antibiotic awaiting to be afebrile for 24 hours before PICC placed for IV antibiotics as an outpatient and discharged to [facility].   Assessment/Plan: Coagulase negative Staphylococcus bacteremia/HCAP (healthcare-associated pneumonia) - Antibiotics stop on 10/31/2014.  Afebrile for the last 24 hours. WBC cont to be elevated. - 2-D echo with results as indicated below.  - Uric acid is low, no history of gout.  Continue PPI. Cont florastor. - Appreciate ID input.  Encephalopathy acute - Is now resolved, it was most likely due to infectious etiology.   Chronic pain syndrome - Patient continues to have intermittent leg and neck pain, x-ray shows no acute abnormalities. - Patient indicates the pain is chronic which she has had for many years.  Chronic diastolic heart failure: - Appears to be euvolemic.  Iron deficiency anemia: - Has received 2 units of packed red blood cells. - Family refused any further GI evaluation. Continue PPI twice a day.  Diarrhea: - Stool negative for C. Difficile. Continue florastor.  Code Status: full Family Communication: none at bedside  Disposition Plan: SNF when PICC line placed?   Consultants:  ID  Procedures:  CXR, 2D Echo on 10/24/2014 - normal LV function, grade 2 diastolic dysfunction, moderate LAE, calcified aortic valve with mild AS, moderate MR, moderate TR with severely elevated pulmonary pressure.  Antibiotics:  Vanc and cefepime 1.10.16 >> 1.14.16  Cefazolin 10/23/14 >> 10/31/2014 (with intentions to treat for 4 weeks)   HPI/Subjective: Afebrile for the  last 24 hours.  Complaining of bilateral lower extremity pain going from the top of her legs to the bottom of her legs. She states that she has had this type of pain for many years which is unchanged.  Objective: Filed Vitals:   11/01/14 2130 11/02/14 0555 11/02/14 0558 11/02/14 0747  BP: 125/87 128/55    Pulse: 78 79    Temp: 97.9 F (36.6 C) 98.1 F (36.7 C)    TempSrc: Oral Oral    Resp: 16 16    Height:      Weight:   58.6 kg (129 lb 3 oz)   SpO2: 99% 98%  95%    Intake/Output Summary (Last 24 hours) at 11/02/14 0847 Last data filed at 11/01/14 1529  Gross per 24 hour  Intake 214.67 ml  Output      0 ml  Net 214.67 ml   Filed Weights   10/31/14 0246 11/01/14 0533 11/02/14 0558  Weight: 60.5 kg (133 lb 6.1 oz) 57.7 kg (127 lb 3.3 oz) 58.6 kg (129 lb 3 oz)    Exam:  Physical Exam: General: Awake, Oriented, No acute distress. HEENT: EOMI. Neck: Supple CV: S1 and S2 Lungs: Clear to ascultation bilaterally Abdomen: Soft, Nontender, Nondistended, +bowel sounds. Ext: Good pulses. Trace edema.  Data Reviewed: Basic Metabolic Panel:  Recent Labs Lab 10/28/14 0500 10/29/14 0402  NA 137 137  K 3.9 4.1  CL 104 105  CO2 27 27  GLUCOSE 95 92  BUN 20 25*  CREATININE 0.81 0.80  CALCIUM 7.7* 7.7*   Liver Function Tests: No results for input(s): AST, ALT, ALKPHOS, BILITOT, PROT, ALBUMIN in the last 168 hours. No results for input(s): LIPASE, AMYLASE in the last 168  hours. No results for input(s): AMMONIA in the last 168 hours. CBC:  Recent Labs Lab 10/26/14 1000 10/28/14 0500 10/29/14 0402 10/30/14 0706  WBC 11.2* 18.0* 15.8* 14.9*  HGB 8.8* 8.2* 7.6* 7.9*  HCT 27.3* 26.2* 24.7* 25.0*  MCV 87.2 88.5 90.1 89.3  PLT 207 330 310 379   Cardiac Enzymes: No results for input(s): CKTOTAL, CKMB, CKMBINDEX, TROPONINI in the last 168 hours. BNP (last 3 results) No results for input(s): PROBNP in the last 8760 hours. CBG: No results for input(s): GLUCAP in the  last 168 hours.  Recent Results (from the past 240 hour(s))  Culture, blood (routine x 2)     Status: None   Collection Time: 10/23/14  5:15 PM  Result Value Ref Range Status   Specimen Description BLOOD LEFT HAND  Final   Special Requests BOTTLES DRAWN AEROBIC ONLY  3 CC  Final   Culture   Final    NO GROWTH 5 DAYS Performed at [facility]    Report Status 10/30/2014 FINAL  Final  Culture, blood (routine x 2)     Status: None   Collection Time: 10/23/14  5:19 PM  Result Value Ref Range Status   Specimen Description BLOOD RIGHT HAND  Final   Special Requests BOTTLES DRAWN AEROBIC ONLY 3 CC  Final   Culture   Final    NO GROWTH 5 DAYS Performed at [facility]    Report Status 10/30/2014 FINAL  Final  Clostridium Difficile by PCR     Status: None   Collection Time: 10/24/14  4:24 PM  Result Value Ref Range Status   C difficile by pcr NEGATIVE NEGATIVE Final    Comment: Performed at [facility]  Culture, Urine     Status: None   Collection Time: 10/28/14 12:06 PM  Result Value Ref Range Status   Specimen Description URINE, CLEAN CATCH  Final   Special Requests NONE  Final   Colony Count   Final    25,000 COLONIES/ML Performed at [facility]    Culture   Final    Multiple bacterial morphotypes present, none predominant. Suggest appropriate recollection if clinically indicated. Performed at [facility]    Report Status 10/29/2014 FINAL  Final  Culture, blood (routine x 2)     Status: None (Preliminary result)   Collection Time: 10/29/14  4:35 PM  Result Value Ref Range Status   Specimen Description BLOOD RIGHT HAND  Final   Special Requests BOTTLES DRAWN AEROBIC ONLY 3 CC  Final   Culture   Final           BLOOD CULTURE RECEIVED NO GROWTH TO DATE CULTURE WILL BE HELD FOR 5 DAYS BEFORE ISSUING A FINAL NEGATIVE REPORT Performed at [facility]    Report Status PENDING  Incomplete  Culture, blood (routine x 2)     Status:  None (Preliminary result)   Collection Time: 10/29/14  4:40 PM  Result Value Ref Range Status   Specimen Description BLOOD RIGHT HAND  Final   Special Requests BOTTLES DRAWN AEROBIC ONLY  3 CC  Final   Culture   Final           BLOOD CULTURE RECEIVED NO GROWTH TO DATE CULTURE WILL BE HELD FOR 5 DAYS BEFORE ISSUING A FINAL NEGATIVE REPORT Performed at [facility]    Report Status PENDING  Incomplete     Studies: No results found.  Scheduled Meds: . budesonide (PULMICORT)  nebulizer solution  0.25 mg Nebulization BID  . feeding supplement (ENSURE COMPLETE)  237 mL Oral BID BM  . fentaNYL  25 mcg Transdermal Q72H  . ferrous sulfate  325 mg Oral TID WC  . gabapentin  600 mg Oral TID  . guaiFENesin  600 mg Oral BID  . ipratropium-albuterol  3 mL Nebulization BID  . pantoprazole  40 mg Oral BID  . saccharomyces boulardii  250 mg Oral BID  . sertraline  75 mg Oral Daily  . sodium chloride  3 mL Intravenous Q12H   Continuous Infusions:    Rickayla Wieland A, MD  [facility] 11/02/2014, 8:47 AM  LOS: 14 days

## 2014-11-03 LAB — CBC
HEMATOCRIT: 17.4 % — AB (ref 36.0–46.0)
HEMOGLOBIN: 5.4 g/dL — AB (ref 12.0–15.0)
MCH: 27.7 pg (ref 26.0–34.0)
MCHC: 31 g/dL (ref 30.0–36.0)
MCV: 89.2 fL (ref 78.0–100.0)
Platelets: 413 10*3/uL — ABNORMAL HIGH (ref 150–400)
RBC: 1.95 MIL/uL — ABNORMAL LOW (ref 3.87–5.11)
RDW: 16.9 % — ABNORMAL HIGH (ref 11.5–15.5)
WBC: 11.9 10*3/uL — ABNORMAL HIGH (ref 4.0–10.5)

## 2014-11-03 LAB — BASIC METABOLIC PANEL
ANION GAP: 8 (ref 5–15)
BUN: 31 mg/dL — ABNORMAL HIGH (ref 6–23)
CHLORIDE: 111 mmol/L (ref 96–112)
CO2: 25 mmol/L (ref 19–32)
Calcium: 7.7 mg/dL — ABNORMAL LOW (ref 8.4–10.5)
Creatinine, Ser: 0.86 mg/dL (ref 0.50–1.10)
GFR calc Af Amer: 68 mL/min — ABNORMAL LOW (ref >90)
GFR calc non Af Amer: 59 mL/min — ABNORMAL LOW (ref >90)
Glucose, Bld: 87 mg/dL (ref 70–99)
POTASSIUM: 4.4 mmol/L (ref 3.5–5.1)
SODIUM: 144 mmol/L (ref 135–145)

## 2014-11-03 LAB — PREPARE RBC (CROSSMATCH)

## 2014-11-03 MED ORDER — SODIUM CHLORIDE 0.9 % IV SOLN: Freq: Once | INTRAVENOUS | Status: DC

## 2014-11-03 NOTE — Progress Notes (Signed)
PT Cancellation Note  Patient Details Name: [name] MRN: [id number] DOB: [birth year]   Cancelled Treatment:    Reason Eval/Treat Not Completed: Medical issues which prohibited therapy (low Hgb and BP, to get transfused.)   Claretha Cooper 11/03/2014, 8:11 AM [name] PT [phone]

## 2014-11-03 NOTE — Progress Notes (Signed)
[facility] PROGRESS NOTE  [name] VWU:[id number] DOB: Aug 01, 1925 DOA: 10/19/2014 PCP: [name], DO  Assessment/Plan: 81 y/o female with PMH of HTN, CVA, CKD and chronic pain was admitted with healthcare associated pneumonia and acute encephalopathy. Found to have bacteremia (2/2 coagulase -negative staph aureus).   1. Coagulase negative Staphylococcus bacteremia/HCAP (healthcare-associated pneumonia) -completed IV atx; Antibiotics stopped on 10/31/2014. Afebrile for the last 24 hours.repeat blood cultures: NGTD -per ID completed 2 week atx, Appreciate ID input. 2. Encephalopathy acute thought likely due to dementia +infectious etiology.  -no new symptoms; cont monitor  3. Chronic pain syndrome - Patient continues to have intermittent leg and neck pain, x-ray shows no acute abnormalities. - Patient indicates the pain is chronic which she has had for many years. 4. Chronic diastolic heart failure: Chronic CHF, LVH, pulmonary HTN; Appears to be euvolemic. 5. Blood loss anemia, occult blood + on 1/16; chronic Iron deficiency anemia: -initially received 2 units of packed red blood cells; per notes: Family refused any further GI evaluation. -today Hg dropped again to 5.4; will TF 2 units PRBCs, no acute bleeding, cont PPI twice a day. Repeat labs in AM  -patient had colonoscopy (2012): unremarkable; she had +occult blood even at that time -if persistent drop in Hg, with no further GI eval per family; may need to consider palliative care   6. Diarrhea: Stool negative for C. Difficile. Continue florastor.  Code Status: full Family Communication: d/w patient, called all phones listed in EPIC not answer; [phone] is not a family; will try later again (indicate person spoken with, relationship, and if by phone, the number) Disposition Plan: snf; pend blood TF; recheck Hg in AM    Consultants:  ID  Procedures:    Antibiotics: Anti-infectives     Start    Dose/Rate   Route  Frequency  Ordered  Stop    10/23/14 2000   vancomycin (VANCOCIN) 500 mg in sodium chloride 0.9 % 100 mL IVPB Status: Discontinued  500 mg100 mL/hr over 60 Minutes  Intravenous  Every 12 hours  10/22/14 2340  10/23/14 1656    10/23/14 1800   ceFAZolin (ANCEF) IVPB 1 g/50 mL premix Status: Discontinued  1 g100 mL/hr over 30 Minutes  Intravenous  Every 8 hours  10/23/14 1711  10/31/14 1159    10/23/14 0800   vancomycin (VANCOCIN) IVPB 750 mg/150 ml premix  750 mg150 mL/hr over 60 Minutes  Intravenous  Once  10/22/14 2340  10/23/14 1018    10/22/14 2200   vancomycin (VANCOCIN) 500 mg in sodium chloride 0.9 % 100 mL IVPB Status: Discontinued  500 mg100 mL/hr over 60 Minutes  Intravenous  Every 12 hours  10/22/14 1235  10/22/14 2340    10/22/14 1500   ceFEPIme (MAXIPIME) 2 g in dextrose 5 % 50 mL IVPB Status: Discontinued  2 g100 mL/hr over 30 Minutes  Intravenous  Every 24 hours  10/22/14 1235  10/23/14 1656    10/22/14 1400   amoxicillin-clavulanate (AUGMENTIN) 500-125 MG per tablet 500 mg Status: Discontinued  1 tablet  Oral  3 times per day  10/22/14 1142  10/22/14 1144    10/21/14 1500   ceFEPIme (MAXIPIME) 2 g in dextrose 5 % 50 mL IVPB Status: Discontinued  2 g100 mL/hr over 30 Minutes  Intravenous  Every 24 hours  10/21/14 1321  10/22/14 1142    10/20/14 1500   ceFEPIme (MAXIPIME) 2 g in dextrose 5 % 50 mL IVPB  2 g100 mL/hr over  30 Minutes  Intravenous  Once  10/20/14 1408  10/20/14 1543    10/20/14 1000   vancomycin (VANCOCIN) 500 mg in sodium chloride 0.9 % 100 mL IVPB Status: Discontinued  500 mg100 mL/hr over 60 Minutes  Intravenous  Every 12 hours  10/19/14 2044  10/22/14 1142    10/19/14 2345   aztreonam (AZACTAM) 2 g in dextrose 5 % 50 mL IVPB Status: Discontinued  2 g100 mL/hr over 30 Minutes  Intravenous  3 times per day  10/19/14 2330  10/20/14 1408    10/19/14 2100   vancomycin (VANCOCIN) IVPB 1000 mg/200 mL premix Status: Discontinued  1,000 mg200 mL/hr over 60 Minutes  Intravenous   Once  10/19/14 2044  10/19/14 2330    10/19/14 2030   ceFEPIme (MAXIPIME) 2 g in dextrose 5 % 50 mL IVPB  2 g100 mL/hr over 30 Minutes  Intravenous  Once  10/19/14 2027  10/19/14 2200    10/19/14 2030   azithromycin (ZITHROMAX) 500 mg in dextrose 5 % 250 mL IVPB  500 mg250 mL/hr over 60 Minutes         (indicate start date, and stop date if known)  HPI/Subjective: Alert, confused   Objective: Filed Vitals:   11/03/14 1045  BP: 105/47  Pulse: 85  Temp: 99.2 F (37.3 C)  Resp: 14    Intake/Output Summary (Last 24 hours) at 11/03/14 1136 Last data filed at 11/03/14 0949  Gross per 24 hour  Intake    303 ml  Output      0 ml  Net    303 ml   Filed Weights   11/01/14 0533 11/02/14 0558 11/03/14 0540  Weight: 57.7 kg (127 lb 3.3 oz) 58.6 kg (129 lb 3 oz) 58.4 kg (128 lb 12 oz)    Exam:   General:  Alert, but confused   Cardiovascular: s1,s2 rrr  Respiratory: CTA BL  Abdomen: soft, nt,nd   Musculoskeletal: no le edema   Data Reviewed: Basic Metabolic Panel:  Recent Labs Lab 10/28/14 0500 10/29/14 0402 11/03/14 0532  NA 137 137 144  K 3.9 4.1 4.4  CL 104 105 111  CO2 27 27 25   GLUCOSE 95 92 87  BUN 20 25* 31*  CREATININE 0.81 0.80 0.86  CALCIUM 7.7* 7.7* 7.7*   Liver Function Tests: No results for input(s): AST, ALT, ALKPHOS, BILITOT, PROT, ALBUMIN in the last 168 hours. No results for input(s): LIPASE, AMYLASE in the last 168 hours. No results for input(s): AMMONIA in the last 168 hours. CBC:  Recent Labs Lab 10/28/14 0500 10/29/14 0402 10/30/14 0706 11/03/14 0532  WBC 18.0* 15.8* 14.9* 11.9*  HGB 8.2* 7.6* 7.9* 5.4*  HCT 26.2* 24.7* 25.0* 17.4*  MCV 88.5 90.1 89.3 89.2  PLT 330 310 379 413*   Cardiac Enzymes: No results for input(s): CKTOTAL, CKMB, CKMBINDEX, TROPONINI in the last 168 hours. BNP (last 3 results) No results for input(s): PROBNP in the last 8760 hours. CBG: No results for input(s): GLUCAP in the last 168 hours.  Recent  Results (from the past 240 hour(s))  Clostridium Difficile by PCR     Status: None   Collection Time: 10/24/14  4:24 PM  Result Value Ref Range Status   C difficile by pcr NEGATIVE NEGATIVE Final    Comment: Performed at [facility]  Culture, Urine     Status: None   Collection Time: 10/28/14 12:06 PM  Result Value Ref Range Status   Specimen Description URINE, CLEAN  CATCH  Final   Special Requests NONE  Final   Colony Count   Final    25,000 COLONIES/ML Performed at [facility]    Culture   Final    Multiple bacterial morphotypes present, none predominant. Suggest appropriate recollection if clinically indicated. Performed at [facility]    Report Status 10/29/2014 FINAL  Final  Culture, blood (routine x 2)     Status: None (Preliminary result)   Collection Time: 10/29/14  4:35 PM  Result Value Ref Range Status   Specimen Description BLOOD RIGHT HAND  Final   Special Requests BOTTLES DRAWN AEROBIC ONLY 3 CC  Final   Culture   Final           BLOOD CULTURE RECEIVED NO GROWTH TO DATE CULTURE WILL BE HELD FOR 5 DAYS BEFORE ISSUING A FINAL NEGATIVE REPORT Performed at [facility]    Report Status PENDING  Incomplete  Culture, blood (routine x 2)     Status: None (Preliminary result)   Collection Time: 10/29/14  4:40 PM  Result Value Ref Range Status   Specimen Description BLOOD RIGHT HAND  Final   Special Requests BOTTLES DRAWN AEROBIC ONLY  3 CC  Final   Culture   Final           BLOOD CULTURE RECEIVED NO GROWTH TO DATE CULTURE WILL BE HELD FOR 5 DAYS BEFORE ISSUING A FINAL NEGATIVE REPORT Performed at [facility]    Report Status PENDING  Incomplete     Studies: No results found.  Scheduled Meds: . sodium chloride   Intravenous Once  . budesonide (PULMICORT) nebulizer solution  0.25 mg Nebulization BID  . feeding supplement (ENSURE COMPLETE)  237 mL Oral BID BM  . fentaNYL  25 mcg Transdermal Q72H  . ferrous sulfate  325 mg  Oral TID WC  . gabapentin  600 mg Oral TID  . guaiFENesin  600 mg Oral BID  . ipratropium-albuterol  3 mL Nebulization BID  . pantoprazole  40 mg Oral BID  . saccharomyces boulardii  250 mg Oral BID  . sertraline  75 mg Oral Daily  . sodium chloride  3 mL Intravenous Q12H   Continuous Infusions:   Principal Problem:   HCAP (healthcare-associated pneumonia) Active Problems:   Essential hypertension   Diastolic dysfunction   Chronic pain syndrome   Encephalopathy acute   Coagulase negative Staphylococcus bacteremia    Time spent: >35 minutes     Kinnie Feil  [facility] Pager [phone]. If 7PM-7AM, please contact night-coverage at [email or web address], password [email or web address] 11/03/2014, 11:36 AM  LOS: 15 days

## 2014-11-03 NOTE — Progress Notes (Signed)
CRITICAL VALUE ALERT  Critical value received:  hgb 5.4  Date of notification:  11/03/2014  Time of notification:  9791  Critical value read back:Yes.    Nurse who received alert:  mk  MD notified (1st page):  callahan  Time of first page:  0555  MD notified (2nd page):  Time of second page:  Responding MD:  callahan  Time MD responded:  [phone]

## 2014-11-03 NOTE — Progress Notes (Signed)
CSW continues to follow this pt from [facility]. Updated PN have been sent to SNF. [name] will readmit when pt is stable for d/c.   Werner Lean LCSW [phone]

## 2014-11-03 NOTE — Progress Notes (Signed)
Patient ID: [name], female   DOB: [birth year], 81 y.o.   MRN: [id number]         [facility]    Date of Admission:  10/19/2014   Off antibiotics  3 days          Principal Problem:   HCAP (healthcare-associated pneumonia) Active Problems:   Coagulase negative Staphylococcus bacteremia   Essential hypertension   Diastolic dysfunction   Chronic pain syndrome   Encephalopathy acute   . sodium chloride   Intravenous Once  . budesonide (PULMICORT) nebulizer solution  0.25 mg Nebulization BID  . feeding supplement (ENSURE COMPLETE)  237 mL Oral BID BM  . fentaNYL  25 mcg Transdermal Q72H  . ferrous sulfate  325 mg Oral TID WC  . gabapentin  600 mg Oral TID  . guaiFENesin  600 mg Oral BID  . ipratropium-albuterol  3 mL Nebulization BID  . pantoprazole  40 mg Oral BID  . saccharomyces boulardii  250 mg Oral BID  . sertraline  75 mg Oral Daily  . sodium chloride  3 mL Intravenous Q12H    Subjective: No change in her chronic leg and foot pain.  Review of Systems: Pertinent items are noted in HPI.  Past Medical History  Diagnosis Date  . Hypertension   . Head ache   . Renal insufficiency, mild   . Depression   . Fecal occult blood test positive   . Restless leg syndrome   . Anemia   . Cataracts, bilateral   . Hemorrhoids   . Rupture of rotator cuff of shoulder     s/p repair by Dr. Berenice Primas 7/07. Follows with Dr. Tamera Punt for possible shoulder surgery.   . Lumbar spinal stenosis     gets Spinal injections by Dr. Marlaine Hind   . Postnasal drip   . Atrial tachycardia     echo 04/08/11: EF 55-60%, mild LVH, grade 1 diast dysfxn;    s/p RFCA with Dr. Lovena Le  04/09/11  . Hyperkalemia 04/08/2011  . Unspecified deficiency anemia   . Osteoporosis, unspecified 04/09/2013    DEXA 5/07 : T L forearm -2.9. L femur -0.6, R femur -1.4. [name]  . CVA (cerebral infarction) 04/09/2013    MRI 2005 : Multiple tiny old lacunar infarcts as well  as scattered white matter small vessel ischemic disease. Pt asymptomatic and denies TIA / CVA hx.    . Chronic pain syndrome 04/09/2013    On chronic opioids. Cervical MRI 11/ 2012 : Diffuse cervical spondylosis, Diffuse degenerative disc disease with multilevel foraminal stenosis. Possible T1 nerve root involvement. Lumbar MRI 2009 : prior fusion at L3-4 and degenerative disc disease Rotator cuff repair : Dr. Berenice Primas 7/07. Follows with Dr. Tamera Punt for possible shoulder surgery.  H/O lumbar spinal stenosis with steroid injections. Mo  . Arthritis     b/l shoulder R>L  . Rotator cuff tear arthropathy     03/22/13 dx [facility] (Dr. Tamera Punt) considering surgery  . Stroke   . History of blood transfusion 2014    anemia-     History  Substance Use Topics  . Smoking status: Never Smoker   . Smokeless tobacco: Not on file  . Alcohol Use: No    Family History  Problem Relation Age of Onset  . Coronary artery disease Neg Hx     premature   Allergies  Allergen Reactions  . Codeine     Hallucinations  .  Ibuprofen     Confusion    OBJECTIVE: Blood pressure 105/47, pulse 85, temperature 99.2 F (37.3 C), temperature source Oral, resp. rate 14, height 5\' 3"  (1.6 m), weight 128 lb 12 oz (58.4 kg), SpO2 98 %. General: She is alert today and interactive.  Skin: No rash. IV site looks good. Lungs: Clear Cor: Regular S1 and S2 with no murmur Abdomen: Soft and nontender  Lab Results Lab Results  Component Value Date   WBC 11.9* 11/03/2014   HGB 5.4* 11/03/2014   HCT 17.4* 11/03/2014   MCV 89.2 11/03/2014   PLT 413* 11/03/2014    Lab Results  Component Value Date   CREATININE 0.86 11/03/2014   BUN 31* 11/03/2014   NA 144 11/03/2014   K 4.4 11/03/2014   CL 111 11/03/2014   CO2 25 11/03/2014    Lab Results  Component Value Date   ALT 20 10/19/2014   AST 73* 10/19/2014   ALKPHOS 139* 10/19/2014   BILITOT 0.7 10/19/2014     Microbiology: Recent Results (from the  past 240 hour(s))  Clostridium Difficile by PCR     Status: None   Collection Time: 10/24/14  4:24 PM  Result Value Ref Range Status   C difficile by pcr NEGATIVE NEGATIVE Final    Comment: Performed at [facility]  Culture, Urine     Status: None   Collection Time: 10/28/14 12:06 PM  Result Value Ref Range Status   Specimen Description URINE, CLEAN CATCH  Final   Special Requests NONE  Final   Colony Count   Final    25,000 COLONIES/ML Performed at [facility]    Culture   Final    Multiple bacterial morphotypes present, none predominant. Suggest appropriate recollection if clinically indicated. Performed at [facility]    Report Status 10/29/2014 FINAL  Final  Culture, blood (routine x 2)     Status: None (Preliminary result)   Collection Time: 10/29/14  4:35 PM  Result Value Ref Range Status   Specimen Description BLOOD RIGHT HAND  Final   Special Requests BOTTLES DRAWN AEROBIC ONLY 3 CC  Final   Culture   Final           BLOOD CULTURE RECEIVED NO GROWTH TO DATE CULTURE WILL BE HELD FOR 5 DAYS BEFORE ISSUING A FINAL NEGATIVE REPORT Performed at [facility]    Report Status PENDING  Incomplete  Culture, blood (routine x 2)     Status: None (Preliminary result)   Collection Time: 10/29/14  4:40 PM  Result Value Ref Range Status   Specimen Description BLOOD RIGHT HAND  Final   Special Requests BOTTLES DRAWN AEROBIC ONLY  3 CC  Final   Culture   Final           BLOOD CULTURE RECEIVED NO GROWTH TO DATE CULTURE WILL BE HELD FOR 5 DAYS BEFORE ISSUING A FINAL NEGATIVE REPORT Performed at [facility]    Report Status PENDING  Incomplete    Assessment: She has been afebrile since discontinuation of her cefazolin 3 days ago. There is no evidence of any active infection at this time.  Plan: 1. Continue observation off of antibiotics 2. I will sign off now  Michel Bickers, MD [facility] [place] [phone] pager   [phone] cell 11/03/2014, 12:46 PM

## 2014-11-04 DIAGNOSIS — D649 Anemia, unspecified: Secondary | ICD-10-CM | POA: Insufficient documentation

## 2014-11-04 LAB — CBC
HCT: 27.1 % — ABNORMAL LOW (ref 36.0–46.0)
Hemoglobin: 8.8 g/dL — ABNORMAL LOW (ref 12.0–15.0)
MCH: 28.3 pg (ref 26.0–34.0)
MCHC: 32.5 g/dL (ref 30.0–36.0)
MCV: 87.1 fL (ref 78.0–100.0)
PLATELETS: 457 10*3/uL — AB (ref 150–400)
RBC: 3.11 MIL/uL — ABNORMAL LOW (ref 3.87–5.11)
RDW: 17.3 % — AB (ref 11.5–15.5)
WBC: 13.9 10*3/uL — ABNORMAL HIGH (ref 4.0–10.5)

## 2014-11-04 LAB — CULTURE, BLOOD (ROUTINE X 2)
Culture: NO GROWTH
Culture: NO GROWTH

## 2014-11-04 LAB — TYPE AND SCREEN
ABO/RH(D): O POS
Antibody Screen: NEGATIVE
UNIT DIVISION: 0
Unit division: 0

## 2014-11-04 NOTE — Progress Notes (Signed)
NUTRITION FOLLOW UP  Intervention:   -Continue Ensure BID - Encourage PO intake -RD to continue to monitor  Nutrition Dx:   Inadequate oral intake related to HCAP and fever as evidenced by poor po intake and poor appetite; ongoing  Goal:   Pt to meet >/= 90% of their estimated nutrition needs; not met  Monitor:   Total protein/energy intake, labs, weights  Assessment:   1/11: Pt with stable wt. She reports a poor appetite and po intake is recorded as 0%. She says that she has only had sips of water with her medications. Pt encouraged to eat small portions. She agreed to try Ensure Complete. Pt with moderate muscle loss at the temples.   1/19: -Pt continues with poor PO intake, consuming ~20% of meals -Dislikes Heart healthy diet restrictions, reported foods have no taste. RD allowed one salt packet/meal on [employer] -Ensure Complete ordered BID. Drinking 1-2 daily. Enjoys taste -Denied nausea, abd pain post meals or supplements -Last BM on 1/16,RN noted it to be formed. Consider use of stool softener if pt unable to have BM by 1/20 -Wt decreased 7 lbs in one week, likely fluid related (hx of CHF) as well poor PO intake  1/26: - Pt eating dessert and ice cream during RD visit. She said that her appetite is getting better and she "felt like having something sweet today." Pt says that she likes Ensure and that she has been drinking it.  - Continue to encourage PO intake.  - PO intake recorded as 0-50% at meals.   Labs reviewed  Height: Ht Readings from Last 1 Encounters:  10/20/14 5' 3"  (1.6 m)    Weight Status:   Wt Readings from Last 1 Encounters:  11/03/14 128 lb 12 oz (58.4 kg)  10/20/14 141 lb  Re-estimated needs:  Kcal: 1600-1800 Protein: 80-90 g Fluid: 1.6-1.8 L/day   Skin: WDL  Diet Order: Diet regular   Intake/Output Summary (Last 24 hours) at 11/04/14 1442 Last data filed at 11/04/14 0900  Gross per 24 hour  Intake    703 ml  Output    350 ml  Net     353 ml    Last BM: 1/25   Labs:   Recent Labs Lab 10/29/14 0402 11/03/14 0532  NA 137 144  K 4.1 4.4  CL 105 111  CO2 27 25  BUN 25* 31*  CREATININE 0.80 0.86  CALCIUM 7.7* 7.7*  GLUCOSE 92 87    CBG (last 3)  No results for input(s): GLUCAP in the last 72 hours.  Scheduled Meds: . sodium chloride   Intravenous Once  . budesonide (PULMICORT) nebulizer solution  0.25 mg Nebulization BID  . feeding supplement (ENSURE COMPLETE)  237 mL Oral BID BM  . fentaNYL  25 mcg Transdermal Q72H  . ferrous sulfate  325 mg Oral TID WC  . gabapentin  600 mg Oral TID  . guaiFENesin  600 mg Oral BID  . ipratropium-albuterol  3 mL Nebulization BID  . pantoprazole  40 mg Oral BID  . saccharomyces boulardii  250 mg Oral BID  . sertraline  75 mg Oral Daily  . sodium chloride  3 mL Intravenous Q12H    Continuous Infusions:   Laurette Schimke MS, RD, LDN Clinical Dietitian [phone]

## 2014-11-04 NOTE — Progress Notes (Signed)
[facility] PROGRESS NOTE  [name] NOM:[id number] DOB: 04-Sep-1925 DOA: 10/19/2014 PCP: [name], DO  Assessment/Plan: 81 y/o female with PMH of HTN, CVA, CKD and chronic pain was admitted with healthcare associated pneumonia and acute encephalopathy. Found to have bacteremia (2/2 coagulase -negative staph aureus). She then was found to have a Hgb dwn to 5- transfused 2 units 1/25   Coagulase negative Staphylococcus bacteremia/HCAP (healthcare-associated pneumonia) -completed IV atx; Antibiotics stopped on 10/31/2014. Afebrile for the last 24 hours.repeat blood cultures: NGTD -per ID completed 2 week atx, Appreciate ID input.  Encephalopathy acute thought likely due to dementia +infectious etiology.  -no new symptoms; cont monitor    Chronic pain syndrome - Patient continues to have intermittent leg and neck pain, x-ray shows no acute abnormalities. - Patient indicates the pain is chronic which she has had for many years.  Chronic diastolic heart failure: Chronic CHF, LVH, pulmonary HTN; Appears to be euvolemic.   Blood loss anemia, occult blood + on 1/16; chronic Iron deficiency anemia: -initially received 2 units of packed red blood cells; per notes: Family refused any further GI evaluation. -today Hg dropped again to 5.4; will TF 2 units PRBCs, no acute bleeding, cont PPI twice a day. Repeat labs in AM  -patient had colonoscopy (2012): unremarkable; she had +occult blood even at that time -if persistent drop in Hg, with no further GI eval per family; may need to consider palliative care   - if Hgb stable in AM- d/c back to sNF  Diarrhea: Stool negative for C. Difficile. Continue florastor.  Code Status: full Family Communication: d/w patient, son: [name]: [phone]- did not answer Disposition Plan: d/c in am   Consultants:  ID  Procedures:      HPI/Subjective: Occasional abd pain per patient  Objective: Filed Vitals:   11/04/14 0620  BP: 150/75   Pulse: 79  Temp: 98.1 F (36.7 C)  Resp: 20    Intake/Output Summary (Last 24 hours) at 11/04/14 1010 Last data filed at 11/04/14 0900  Gross per 24 hour  Intake 1128.83 ml  Output    350 ml  Net 778.83 ml   Filed Weights   11/01/14 0533 11/02/14 0558 11/03/14 0540  Weight: 57.7 kg (127 lb 3.3 oz) 58.6 kg (129 lb 3 oz) 58.4 kg (128 lb 12 oz)    Exam:   General:  Alert, but confused   Cardiovascular: s1,s2 rrr  Respiratory: CTA BL  Abdomen: soft, nt,nd   Musculoskeletal: no le edema   Data Reviewed: Basic Metabolic Panel:  Recent Labs Lab 10/29/14 0402 11/03/14 0532  NA 137 144  K 4.1 4.4  CL 105 111  CO2 27 25  GLUCOSE 92 87  BUN 25* 31*  CREATININE 0.80 0.86  CALCIUM 7.7* 7.7*   Liver Function Tests: No results for input(s): AST, ALT, ALKPHOS, BILITOT, PROT, ALBUMIN in the last 168 hours. No results for input(s): LIPASE, AMYLASE in the last 168 hours. No results for input(s): AMMONIA in the last 168 hours. CBC:  Recent Labs Lab 10/29/14 0402 10/30/14 0706 11/03/14 0532 11/04/14 0645  WBC 15.8* 14.9* 11.9* 13.9*  HGB 7.6* 7.9* 5.4* 8.8*  HCT 24.7* 25.0* 17.4* 27.1*  MCV 90.1 89.3 89.2 87.1  PLT 310 379 413* 457*   Cardiac Enzymes: No results for input(s): CKTOTAL, CKMB, CKMBINDEX, TROPONINI in the last 168 hours. BNP (last 3 results) No results for input(s): PROBNP in the last 8760 hours. CBG: No results for input(s): GLUCAP in the last  168 hours.  Recent Results (from the past 240 hour(s))  Culture, Urine     Status: None   Collection Time: 10/28/14 12:06 PM  Result Value Ref Range Status   Specimen Description URINE, CLEAN CATCH  Final   Special Requests NONE  Final   Colony Count   Final    25,000 COLONIES/ML Performed at [facility]    Culture   Final    Multiple bacterial morphotypes present, none predominant. Suggest appropriate recollection if clinically indicated. Performed at [facility]    Report  Status 10/29/2014 FINAL  Final  Culture, blood (routine x 2)     Status: None   Collection Time: 10/29/14  4:35 PM  Result Value Ref Range Status   Specimen Description BLOOD RIGHT HAND  Final   Special Requests BOTTLES DRAWN AEROBIC ONLY 3 CC  Final   Culture   Final    NO GROWTH 5 DAYS Performed at [facility]    Report Status 11/04/2014 FINAL  Final  Culture, blood (routine x 2)     Status: None   Collection Time: 10/29/14  4:40 PM  Result Value Ref Range Status   Specimen Description BLOOD RIGHT HAND  Final   Special Requests BOTTLES DRAWN AEROBIC ONLY  3 CC  Final   Culture   Final    NO GROWTH 5 DAYS Performed at [facility]    Report Status 11/04/2014 FINAL  Final     Studies: No results found.  Scheduled Meds: . sodium chloride   Intravenous Once  . budesonide (PULMICORT) nebulizer solution  0.25 mg Nebulization BID  . feeding supplement (ENSURE COMPLETE)  237 mL Oral BID BM  . fentaNYL  25 mcg Transdermal Q72H  . ferrous sulfate  325 mg Oral TID WC  . gabapentin  600 mg Oral TID  . guaiFENesin  600 mg Oral BID  . ipratropium-albuterol  3 mL Nebulization BID  . pantoprazole  40 mg Oral BID  . saccharomyces boulardii  250 mg Oral BID  . sertraline  75 mg Oral Daily  . sodium chloride  3 mL Intravenous Q12H   Continuous Infusions:   Principal Problem:   HCAP (healthcare-associated pneumonia) Active Problems:   Essential hypertension   Diastolic dysfunction   Chronic pain syndrome   Encephalopathy acute   Coagulase negative Staphylococcus bacteremia    Time spent: 25 minutes     [name] [name]  [facility] Pager [phone]. If 7PM-7AM, please contact night-coverage at [email or web address], password [facility] 11/04/2014, 10:10 AM  LOS: 16 days

## 2014-11-04 NOTE — Clinical Documentation Improvement (Signed)
Current documentation reflects 'blood loss anemia, occult blood + on 1/16; chronic iron deficiency anemia".  Pt has been transfused PRBCs earlier this admission and now hgb dropped to 5.4; tranfusion 2 units PRBCs ordered again.  Please clarify in your progress note the acuity (acute, chronic) of the blood loss anemia and carry over to the discharge summary.  Thank you, Mateo Flow, RN [phone] Clinical Documentation Specialist

## 2014-11-04 NOTE — Progress Notes (Signed)
Physical Therapy Treatment Patient Details Name: [name] MRN: [id number] DOB: 12-26-1924 Today's Date: 11/04/2014    History of Present Illness 81 yo female admitted with Pna, AMS, fever. Hx of t11-T12 laminotomy-2014, HTn, CVA, spinal stenosis, atrial tachycardia, chroni pain-opiods.     PT Comments    Pt OOB in recliner.  Assisted to [facility] then amb in hallway twice with one sitting rest break.  Pt alert and taking.  Able to recall the days events.    Follow Up Recommendations  SNF     Equipment Recommendations       Recommendations for Other Services       Precautions / Restrictions Precautions Precautions: Fall Precaution Comments: AMS Restrictions Weight Bearing Restrictions: No    Mobility  Bed Mobility               General bed mobility comments: pt OOB in recliner  Transfers Overall transfer level: Needs assistance Equipment used: Rolling walker (2 wheeled) Transfers: Sit to/from Stand Sit to Stand: Mod assist         General transfer comment: 75% VC's on proper tech and hand placement and 100% cueing with turn completion.  Assisted from recliner to [facility].  severe posterior lean and poor standing posture.  nearly 60 degrees flex lorward.  pt sits quickly.  Ambulation/Gait Ambulation/Gait assistance: +2 safety/equipment;Mod assist Ambulation Distance (Feet): 50 Feet Assistive device: Rolling walker (2 wheeled) Gait Pattern/deviations: Step-to pattern;Step-through pattern;Trunk flexed;Decreased stance time - left Gait velocity: decreased   General Gait Details: amb twice with one sitting rest break.  severe posterior lean esp with initial sit to stand and difficulty self rising.  decreased stance time L LE but no c/o's.  Poor forward flexed posture nearly 60 degrees forward.  HIGH FALL RISK.   Stairs            Wheelchair Mobility    Modified Rankin (Stroke Patients Only)       Balance                                    Cognition                            Exercises      General Comments        Pertinent Vitals/Pain      Home Living                      Prior Function            PT Goals (current goals can now be found in the care plan section) Progress towards PT goals: Progressing toward goals    Frequency  Min 2X/week    PT Plan Current plan remains appropriate    Co-evaluation             End of Session Equipment Utilized During Treatment: Gait belt Activity Tolerance: Patient tolerated treatment well Patient left: in chair;with chair alarm set     Time: 7412-8786 PT Time Calculation (min) (ACUTE ONLY): 21 min  Charges:  $Gait Training: 8-22 mins                    G Codes:      Rica Koyanagi  PTA [facility]  Acute  Rehab Pager      [phone]

## 2014-11-05 ENCOUNTER — Inpatient Hospital Stay (HOSPITAL_COMMUNITY): Payer: Medicare Other

## 2014-11-05 LAB — CBC
HEMATOCRIT: 27.7 % — AB (ref 36.0–46.0)
Hemoglobin: 8.8 g/dL — ABNORMAL LOW (ref 12.0–15.0)
MCH: 28.2 pg (ref 26.0–34.0)
MCHC: 31.8 g/dL (ref 30.0–36.0)
MCV: 88.8 fL (ref 78.0–100.0)
Platelets: 477 10*3/uL — ABNORMAL HIGH (ref 150–400)
RBC: 3.12 MIL/uL — ABNORMAL LOW (ref 3.87–5.11)
RDW: 17.2 % — ABNORMAL HIGH (ref 11.5–15.5)
WBC: 14.8 10*3/uL — AB (ref 4.0–10.5)

## 2014-11-05 MED ORDER — LEVOFLOXACIN 750 MG PO TABS: 750.0000 mg | ORAL_TABLET | Freq: Every day | ORAL | Status: DC

## 2014-11-05 MED ORDER — DOXYCYCLINE HYCLATE 100 MG PO TABS: 100.0000 mg | ORAL_TABLET | Freq: Two times a day (BID) | ORAL | Status: DC

## 2014-11-05 MED ORDER — SACCHAROMYCES BOULARDII 250 MG PO CAPS: 250.0000 mg | ORAL_CAPSULE | Freq: Two times a day (BID) | ORAL | Status: DC

## 2014-11-05 MED ORDER — GUAIFENESIN ER 600 MG PO TB12: 600.0000 mg | ORAL_TABLET | Freq: Two times a day (BID) | ORAL | Status: DC

## 2014-11-05 NOTE — Discharge Summary (Signed)
Physician Discharge Summary  MARVENE STROHM KNL:[id number] DOB: [birth year] DOA: 10/19/2014  PCP: [name], DO  Admit date: 10/19/2014 Discharge date: 11/05/2014  Time spent: 55 minutes  Recommendations for Outpatient Follow-up:  1. Re-assess pneumonia symptoms and check WBC count in 4 days- can extend course of antibiotics if needed 2. F/u on Hemoglobin in 4 days as well- family declined work up for GI bleed   Discharge Condition: stable Diet recommendation: low sodium diet  Discharge Diagnoses:  Principal Problem:   HCAP (healthcare-associated pneumonia) Active Problems:   Essential hypertension   Diastolic dysfunction   Chronic pain syndrome   Encephalopathy acute   Coagulase negative Staphylococcus bacteremia   Absolute anemia   History of present illness:  81 y/o female with PMH of HTN, CVA, CKD and chronic pain was admitted with healthcare associated pneumonia and acute encephalopathy. Found to have bacteremia (2/2 coagulase -negative staph aureus). She then was found to have a Hgb dwn to 5- transfused 2 units 1/25  Hospital Course:  HCAP - was treated for this initially from 1/10 through 1/14 with Vancomycin and Cefepime but treatment was switched to Cefazolin when blood cultures returned positive for Staph.  - on my exam today, the patient is noted to have a very congested sounding cough- she admits to it being persent for about 3 wks now- on chest exam, she has crackles in the right base- blood work reveals a rising WBC count which has gone up from 11/9 on 1/25 to 14/8 today - she has had only low grade temps of 99 for the past few days- last high temp was on 1/22 101.7.  - CXR reveals that there is a mild infiltrate-atelectasis vs pneumonia in RLL-  - at this point, in setting of above symptoms, I suggest resuming antibiotics appropriate for pneumonia. I will start Levaquin and Doxycycline to complete 5 more days- if symptoms and crackles persist, this course can be  extended to a 7-10 days.   Coagulase negative Staphylococcus bacteremia/HCAP (healthcare-associated pneumonia) - 1/14 cultures positive for coag neg staph - completed IV atx; Antibiotics stopped on 10/31/2014 - 2 wks of antibiotics completed per ID recommendations - repeat blood cultures: NGTD  Blood loss anemia, occult blood + on 1/16; chronic Iron deficiency anemia: -given 1 U PRBC on 1/15, 2 U on 1/16 and 2 more units on 1/25 - according to prior notes from by colleagues, Family refused any further GI evaluation. -  cont PPI twice a day- Hgb stable  -patient had colonoscopy (2012): unremarkable; she had +occult blood even at that time -if persistent drop in Hg, with no further GI eval per family; may need to consider palliative care   Encephalopathy acute thought likely due to dementia +infectious etiology.  -no new symptoms; cont monitor   Chronic pain syndrome - Patient continues to have intermittent leg and neck pain, x-ray shows no acute abnormalities. - Patient indicates the pain is chronic which she has had for many years.  Chronic diastolic heart failure:  Chronic CHF, LVH, pulmonary HTN; Appears to be euvolemic.  Diarrhea:  - this issue has resolved- Stool negative for C. Difficile on 1/16 - Continue florastor.  Procedures: ECHO 10/24/14 Normal LV function; grade 2 diastolic dysfunction; moderate LAE; calcified aortic valve with mild AS; moderate MR; moderate TR with severely elevated pulmonary pressure.  Consultations:  ID  Discharge Exam: Filed Weights   11/02/14 0558 11/03/14 0540 11/05/14 0620  Weight: 58.6 kg (129 lb 3 oz) 58.4 kg (128  lb 12 oz) 55.5 kg (122 lb 5.7 oz)   Filed Vitals:   11/05/14 0620  BP: 155/69  Pulse: 85  Temp: 99.6 F (37.6 C)  Resp: 18    General: AAO x 3, no distress Cardiovascular: RRR, no murmurs  Respiratory: clear to auscultation bilaterally GI: soft, non-tender, non-distended, bowel sound positive  Discharge  Instructions You were cared for by a hospitalist during your hospital stay. If you have any questions about your discharge medications or the care you received while you were in the hospital after you are discharged, you can call the unit and asked to speak with the hospitalist on call if the hospitalist that took care of you is not available. Once you are discharged, your primary care physician will handle any further medical issues. Please note that NO REFILLS for any discharge medications will be authorized once you are discharged, as it is imperative that you return to your primary care physician (or establish a relationship with a primary care physician if you do not have one) for your aftercare needs so that they can reassess your need for medications and monitor your lab values.      Discharge Instructions    Diet - low sodium heart healthy    Complete by:  As directed      Increase activity slowly    Complete by:  As directed             Medication List    STOP taking these medications        HYDROcodone-acetaminophen 10-325 MG per tablet  Commonly known as:  NORCO     HYDROcodone-acetaminophen 5-325 MG per tablet  Commonly known as:  NORCO/VICODIN      TAKE these medications        CALCIUM PO  Take 60 mg by mouth 2 (two) times daily.     carboxymethylcellulose 0.5 % Soln  Commonly known as:  REFRESH PLUS  Place 1 drop into the right eye 3 (three) times daily.     DECUBI-VITE PO  Take 1 capsule by mouth daily.     diclofenac sodium 1 % Gel  Commonly known as:  VOLTAREN  Apply 4 g topically 4 (four) times daily. Bilateral knees     doxycycline 100 MG tablet  Commonly known as:  VIBRA-TABS  Take 1 tablet (100 mg total) by mouth 2 (two) times daily.     fentaNYL 25 MCG/HR patch  Commonly known as:  DURAGESIC - dosed mcg/hr  Place 1 patch (25 mcg total) onto the skin every 3 (three) days.     ferrous sulfate 325 (65 FE) MG tablet  Take 325 mg by mouth 3 (three)  times daily with meals.     fluticasone 50 MCG/ACT nasal spray  Commonly known as:  FLONASE  Place 1 spray into both nostrils daily as needed for allergies or rhinitis (congestion).     gabapentin 600 MG tablet  Commonly known as:  NEURONTIN  Take 600 mg by mouth 3 (three) times daily.     guaiFENesin 600 MG 12 hr tablet  Commonly known as:  MUCINEX  Take 1 tablet (600 mg total) by mouth 2 (two) times daily.     levofloxacin 750 MG tablet  Commonly known as:  LEVAQUIN  Take 1 tablet (750 mg total) by mouth daily.     LINZESS 145 MCG Caps capsule  Generic drug:  Linaclotide  Take 145 mcg by mouth daily.     loperamide 2  MG capsule  Commonly known as:  IMODIUM  Take 2 mg by mouth as needed for diarrhea or loose stools.     pantoprazole 40 MG tablet  Commonly known as:  PROTONIX  Take 40 mg by mouth daily.     saccharomyces boulardii 250 MG capsule  Commonly known as:  FLORASTOR  Take 1 capsule (250 mg total) by mouth 2 (two) times daily.     sertraline 25 MG tablet  Commonly known as:  ZOLOFT  Take 75 mg by mouth daily.       Allergies  Allergen Reactions  . Codeine     Hallucinations  . Ibuprofen     Confusion      The results of significant diagnostics from this hospitalization (including imaging, microbiology, ancillary and laboratory) are listed below for reference.    Significant Diagnostic Studies: Dg Chest 2 View  10/29/2014   CLINICAL DATA:  Bilateral ankle pain and neck pain. Fever. Weakness.  EXAM: CHEST  2 VIEW  COMPARISON:  10/27/2014  FINDINGS: The patient' s chin and facial tissues obscure parts of the lung apices.  Tortuous and atherosclerotic thoracic aorta. Mild enlargement of the cardiopericardial silhouette  Indistinct vasculature with faint interstitial accentuation in the right upper lobe. Suspected mild atelectasis along the left lung base.  Degenerative bilateral glenohumeral arthropathy.  IMPRESSION: 1. Mild enlargement of the  cardiopericardial silhouette with indistinct pulmonary vasculature potentially read a representing pulmonary venous hypertension. Equivocal interstitial accentuation in the right upper lobe. 2. Mild atelectasis, left lung base. 3. No overt consolidation or overt pulmonary edema. 4. Tortuous and atherosclerotic aortic arch probably contributing to the rightward deviation of the trachea.   Electronically Signed   By: Sherryl Barters M.D.   On: 10/29/2014 18:01   Dg Chest 2 View  10/27/2014   CLINICAL DATA:  Followup pneumonia, cough, congestion  EXAM: CHEST  2 VIEW  COMPARISON:  10/19/2014  FINDINGS: Cardiomediastinal silhouette is stable. Mild hyperinflation. Improvement in aeration without convincing pulmonary edema or segmental infiltrate. Degenerative changes thoracolumbar spine. Degenerative changes bilateral shoulders.  IMPRESSION: Improvement in aeration without pulmonary edema or segmental infiltrate. Mild hyperinflation. Degenerative changes thoracolumbar spine and bilateral shoulders.   Electronically Signed   By: Lahoma Crocker M.D.   On: 10/27/2014 10:34   Dg Cervical Spine 2 Or 3 Views  10/29/2014   CLINICAL DATA:  Neck pain and fever  EXAM: CERVICAL SPINE - 2-3 VIEW  COMPARISON:  Cervical spine series June 27, 2007 and cervical MRI August 17, 2011  FINDINGS: Frontal and lateral views were obtained. There is no fracture or spondylolisthesis. Prevertebral soft tissues and predental space regions are normal. There is moderately severe disc space narrowing at all levels. There are anterior osteophytes at all levels. There are posterior osteophytes at all levels except for C2. There is no erosive change or bony destruction. There is reversal of the lordotic curvature.  IMPRESSION: Extensive multilevel osteoarthritic change. No fracture or spondylolisthesis. Reversal of the lordotic curvature is probably due to muscle spasm. There has been overall progression of osteoarthritic change compared to prior  studies.   Electronically Signed   By: Lowella Grip M.D.   On: 10/29/2014 17:42   Dg Ankle 2 Views Left  10/29/2014   CLINICAL DATA:  Bilateral ankle pain, recent refusal to walk. Fever and weakness.  EXAM: LEFT ANKLE - 2 VIEW  COMPARISON:  05/15/2007  FINDINGS: Diffuse bony demineralization. Vascular calcifications noted. No fracture. No soft tissue swelling over the malleolus.  Talar dome appears intact.  IMPRESSION: 1. No fracture or acute bony findings identified. 2. Sensitivity is significantly reduced due to the severity of bony demineralization. If there is a high index of suspicion of occult bony injury, MRI of the ankle would be suggested.   Electronically Signed   By: Sherryl Barters M.D.   On: 10/29/2014 17:56   Dg Ankle 2 Views Right  10/29/2014   CLINICAL DATA:  Bilateral ankle pain. Fever. Patient refuses to walk.  EXAM: RIGHT ANKLE - 2 VIEW  COMPARISON:  None.  FINDINGS: Bony demineralization. Vascular calcifications. No acute bony findings observed. Degenerative spurring along the posterior subtalar facet.  IMPRESSION: 1. No acute bony findings. 2. Bony demineralization. 3. Degenerative findings along the posterior subtalar facet. 4. Vascular calcifications.   Electronically Signed   By: Sherryl Barters M.D.   On: 10/29/2014 17:58   Dg Chest Port 1 View  11/05/2014   CLINICAL DATA:  Bibasilar crackles.  EXAM: PORTABLE CHEST - 1 VIEW  COMPARISON:  October 29, 2014.  FINDINGS: Stable cardiomediastinal silhouette. No pneumothorax or pleural effusion is noted. Degenerative changes seen involving both glenohumeral joints. Mild central pulmonary vascular congestion is noted. New mild right basilar opacity is noted concerning for subsegmental atelectasis or possibly pneumonia.  IMPRESSION: Mild central pulmonary vascular congestion. New mild right basilar opacity is noted concerning for subsegmental atelectasis or possibly pneumonia. Followup radiographs are recommended.   Electronically  Signed   By: Sabino Dick M.D.   On: 11/05/2014 11:23   Dg Chest Port 1 View  10/19/2014   CLINICAL DATA:  Fever, tachycardia, hypertension  EXAM: PORTABLE CHEST - 1 VIEW  COMPARISON:  06/28/2014  FINDINGS: There is bilateral patchy interstitial and alveolar airspace opacities. There is no pleural effusion or pneumothorax. The heart mediastinum are stable. There is no acute osseous abnormality. There is osteoarthritis of bilateral glenohumeral joints.  IMPRESSION: Bilateral patchy interstitial and alveolar airspace opacities most concerning for multilobar pneumonia including atypical infection.   Electronically Signed   By: Kathreen Devoid   On: 10/19/2014 19:54    Microbiology: Recent Results (from the past 240 hour(s))  Culture, Urine     Status: None   Collection Time: 10/28/14 12:06 PM  Result Value Ref Range Status   Specimen Description URINE, CLEAN CATCH  Final   Special Requests NONE  Final   Colony Count   Final    25,000 COLONIES/ML Performed at [facility]    Culture   Final    Multiple bacterial morphotypes present, none predominant. Suggest appropriate recollection if clinically indicated. Performed at [facility]    Report Status 10/29/2014 FINAL  Final  Culture, blood (routine x 2)     Status: None   Collection Time: 10/29/14  4:35 PM  Result Value Ref Range Status   Specimen Description BLOOD RIGHT HAND  Final   Special Requests BOTTLES DRAWN AEROBIC ONLY 3 CC  Final   Culture   Final    NO GROWTH 5 DAYS Performed at [facility]    Report Status 11/04/2014 FINAL  Final  Culture, blood (routine x 2)     Status: None   Collection Time: 10/29/14  4:40 PM  Result Value Ref Range Status   Specimen Description BLOOD RIGHT HAND  Final   Special Requests BOTTLES DRAWN AEROBIC ONLY  3 CC  Final   Culture   Final    NO GROWTH 5 DAYS Performed at [facility]  Report Status 11/04/2014 FINAL  Final     Labs: Basic Metabolic  Panel:  Recent Labs Lab 11/03/14 0532  NA 144  K 4.4  CL 111  CO2 25  GLUCOSE 87  BUN 31*  CREATININE 0.86  CALCIUM 7.7*   Liver Function Tests: No results for input(s): AST, ALT, ALKPHOS, BILITOT, PROT, ALBUMIN in the last 168 hours. No results for input(s): LIPASE, AMYLASE in the last 168 hours. No results for input(s): AMMONIA in the last 168 hours. CBC:  Recent Labs Lab 10/30/14 0706 11/03/14 0532 11/04/14 0645 11/05/14 0522  WBC 14.9* 11.9* 13.9* 14.8*  HGB 7.9* 5.4* 8.8* 8.8*  HCT 25.0* 17.4* 27.1* 27.7*  MCV 89.3 89.2 87.1 88.8  PLT 379 413* 457* 477*   Cardiac Enzymes: No results for input(s): CKTOTAL, CKMB, CKMBINDEX, TROPONINI in the last 168 hours. BNP: BNP (last 3 results) No results for input(s): PROBNP in the last 8760 hours. CBG: No results for input(s): GLUCAP in the last 168 hours.     SignedDebbe Odea, MD [facility] 11/05/2014, 12:33 PM

## 2014-11-05 NOTE — Progress Notes (Signed)
Pt has SNF bed at [facility] today if stable for D/C. CSW will assist with d/c planning to SNF.   Werner Lean LCSW [phone]

## 2014-11-10 ENCOUNTER — Non-Acute Institutional Stay (SKILLED_NURSING_FACILITY): Payer: Medicare Other | Admitting: Adult Health

## 2014-11-10 ENCOUNTER — Other Ambulatory Visit: Payer: Self-pay | Admitting: Adult Health

## 2014-11-10 DIAGNOSIS — R7881 Bacteremia: Secondary | ICD-10-CM

## 2014-11-10 DIAGNOSIS — G894 Chronic pain syndrome: Secondary | ICD-10-CM

## 2014-11-10 DIAGNOSIS — D649 Anemia, unspecified: Secondary | ICD-10-CM | POA: Diagnosis not present

## 2014-11-10 DIAGNOSIS — J189 Pneumonia, unspecified organism: Secondary | ICD-10-CM

## 2014-11-10 DIAGNOSIS — G629 Polyneuropathy, unspecified: Secondary | ICD-10-CM | POA: Diagnosis not present

## 2014-11-10 DIAGNOSIS — I639 Cerebral infarction, unspecified: Secondary | ICD-10-CM | POA: Diagnosis not present

## 2014-11-10 DIAGNOSIS — K5900 Constipation, unspecified: Secondary | ICD-10-CM

## 2014-11-10 LAB — CBC
HCT: 29.6 % — ABNORMAL LOW (ref 36.0–46.0)
Hemoglobin: 9.2 g/dL — ABNORMAL LOW (ref 12.0–15.0)
MCH: 27.9 pg (ref 26.0–34.0)
MCHC: 31.1 g/dL (ref 30.0–36.0)
MCV: 89.7 fL (ref 78.0–100.0)
MPV: 9 fL (ref 8.6–12.4)
PLATELETS: 386 10*3/uL (ref 150–400)
RBC: 3.3 MIL/uL — ABNORMAL LOW (ref 3.87–5.11)
RDW: 17.1 % — ABNORMAL HIGH (ref 11.5–15.5)
WBC: 10.7 10*3/uL — ABNORMAL HIGH (ref 4.0–10.5)

## 2014-11-14 ENCOUNTER — Encounter: Payer: Self-pay | Admitting: Internal Medicine

## 2014-11-14 ENCOUNTER — Non-Acute Institutional Stay (SKILLED_NURSING_FACILITY): Payer: Medicare Other | Admitting: Internal Medicine

## 2014-11-14 DIAGNOSIS — J189 Pneumonia, unspecified organism: Secondary | ICD-10-CM

## 2014-11-14 DIAGNOSIS — I10 Essential (primary) hypertension: Secondary | ICD-10-CM

## 2014-11-14 DIAGNOSIS — G894 Chronic pain syndrome: Secondary | ICD-10-CM

## 2014-11-14 DIAGNOSIS — D509 Iron deficiency anemia, unspecified: Secondary | ICD-10-CM

## 2014-11-14 NOTE — Progress Notes (Signed)
Patient ID: [name], female   DOB: 07/21/1925, 81 y.o.   MRN: [id number]    HISTORY AND PHYSICAL  Location:    [facility]   Place of Service:   SNF   Extended Emergency Contact Information Primary Emergency Contact: Alvizo,Thomas Address: [address]          [place], [place] [address] [place] Phone: [phone] Mobile Phone: [phone] Relation: Son Secondary Emergency Contact: Davis,Saranetti  [place] Phone: [phone] Relation: Other  Advanced Directive information  FULL CODE  Chief Complaint  Patient presents with  . Readmit To SNF    HCAP, anemia, acute encephalopathy; hx HTN, chronic pain syndrome,    HPI:  81 yo female seen today for readmission into SNF for above. She has no concerns today. No nursing issues. Denies any Cp and SOB and cough improved. She will complete abx levaquin and Doxy today.  Past Medical History  Diagnosis Date  . Hypertension   . Head ache   . Renal insufficiency, mild   . Depression   . Fecal occult blood test positive   . Restless leg syndrome   . Anemia   . Cataracts, bilateral   . Hemorrhoids   . Rupture of rotator cuff of shoulder     s/p repair by Dr. Berenice Primas 7/07. Follows with Dr. Tamera Punt for possible shoulder surgery.   . Lumbar spinal stenosis     gets Spinal injections by Dr. Marlaine Hind   . Postnasal drip   . Atrial tachycardia     echo 04/08/11: EF 55-60%, mild LVH, grade 1 diast dysfxn;    s/p RFCA with Dr. Lovena Le  04/09/11  . Hyperkalemia 04/08/2011  . Unspecified deficiency anemia   . Osteoporosis, unspecified 04/09/2013    DEXA 5/07 : T L forearm -2.9. L femur -0.6, R femur -1.4. [name]  . CVA (cerebral infarction) 04/09/2013    MRI 2005 : Multiple tiny old lacunar infarcts as well as scattered white matter small vessel ischemic disease. Pt asymptomatic and denies TIA / CVA hx.    . Chronic pain syndrome 04/09/2013    On chronic opioids.  Cervical MRI 11/ 2012 : Diffuse cervical spondylosis, Diffuse degenerative disc disease with multilevel foraminal stenosis. Possible T1 nerve root involvement. Lumbar MRI 2009 : prior fusion at L3-4 and degenerative disc disease Rotator cuff repair : Dr. Berenice Primas 7/07. Follows with Dr. Tamera Punt for possible shoulder surgery.  H/O lumbar spinal stenosis with steroid injections. Mo  . Arthritis     b/l shoulder R>L  . Rotator cuff tear arthropathy     03/22/13 dx [facility] (Dr. Tamera Punt) considering surgery  . Stroke   . History of blood transfusion 2014    anemia-     Past Surgical History  Procedure Laterality Date  . Doppler echocardiography  2004  . Rotator cuff repair      right  . Back surgery      lumbar x 2, 2000 and 2004 by Dr. Deri Fuelling.   . Eye surgery Bilateral     cataract  . Abdominal hysterectomy    . Orif finger fracture Left     ring finger  . Lumbar laminectomy/decompression microdiscectomy Left 08/06/2013    Procedure: Left Thoracic eleven-twelve microdiskectomy ;  Surgeon: Charlie Pitter, MD;  Location: [facility];  Service: Neurosurgery;  Laterality: Left;  Left Thoracic eleven-twelve microdiskectomy     Patient Care Team: [facility]  Viviano Simas, DO as PCP - General (Internal Medicine) Juanita Craver, MD as Attending Physician (Gastroenterology)  History   Social History  . Marital Status: Single    Spouse Name: N/A    Number of Children: 1  . Years of Education: N/A   Occupational History  .     Social History Main Topics  . Smoking status: Never Smoker   . Smokeless tobacco: Not on file  . Alcohol Use: No  . Drug Use: No  . Sexual Activity: Not on file   Other Topics Concern  . Not on file   Social History Narrative   Originally from [place]. She has been here in the [place] since the 60's.   She used to be a hair stylist   As of 12/2012 she lives alone but has a home helper who comes in 3 hours M-F and 2 hours on Sat,  Sunday.        reports that she has never smoked. She does not have any smokeless tobacco history on file. She reports that she does not drink alcohol or use illicit drugs.  Family History  Problem Relation Age of Onset  . Coronary artery disease Neg Hx     premature   No family status information on file.    Immunization History  Administered Date(s) Administered  . Influenza Split 07/11/2011, 10/20/2011, 07/30/2012  . Influenza Whole 08/16/2007, 06/09/2010  . Influenza,inj,Quad PF,36+ Mos 06/27/2013  . Influenza-Unspecified 07/23/2014  . PPD Test 08/12/2013  . Pneumococcal Polysaccharide-23 07/11/2011  . Pneumococcal-Unspecified 07/28/2014  . Td 06/18/2009  . Tdap 06/28/2014   Past medical, surgical, family and social history reviewed  Allergies  Allergen Reactions  . Codeine     Hallucinations  . Ibuprofen     Confusion    Medications: Patient's Medications  New Prescriptions   No medications on file  Previous Medications   CALCIUM PO    Take 60 mg by mouth 2 (two) times daily.   CARBOXYMETHYLCELLULOSE (REFRESH PLUS) 0.5 % SOLN    Place 1 drop into the right eye 3 (three) times daily.    DICLOFENAC SODIUM (VOLTAREN) 1 % GEL    Apply 4 g topically 4 (four) times daily. Bilateral knees   DOXYCYCLINE (VIBRA-TABS) 100 MG TABLET    Take 1 tablet (100 mg total) by mouth 2 (two) times daily.   FENTANYL (DURAGESIC - DOSED MCG/HR) 25 MCG/HR PATCH    Place 1 patch (25 mcg total) onto the skin every 3 (three) days.   FERROUS SULFATE 325 (65 FE) MG TABLET    Take 325 mg by mouth 3 (three) times daily with meals.    FLUTICASONE (FLONASE) 50 MCG/ACT NASAL SPRAY    Place 1 spray into both nostrils daily as needed for allergies or rhinitis (congestion).   GABAPENTIN (NEURONTIN) 600 MG TABLET    Take 600 mg by mouth 3 (three) times daily.   GUAIFENESIN (MUCINEX) 600 MG 12 HR TABLET    Take 1 tablet (600 mg total) by mouth 2 (two) times daily.   LEVOFLOXACIN (LEVAQUIN) 750 MG TABLET     Take 1 tablet (750 mg total) by mouth daily.   LINACLOTIDE (LINZESS) 145 MCG CAPS CAPSULE    Take 145 mcg by mouth daily.   LOPERAMIDE (IMODIUM) 2 MG CAPSULE    Take 2 mg by mouth as needed for diarrhea or loose stools.   MULTIPLE VITAMINS-MINERALS (DECUBI-VITE PO)    Take 1 capsule by mouth daily.  PANTOPRAZOLE (PROTONIX) 40 MG TABLET    Take 40 mg by mouth daily.   SACCHAROMYCES BOULARDII (FLORASTOR) 250 MG CAPSULE    Take 1 capsule (250 mg total) by mouth 2 (two) times daily.   SERTRALINE (ZOLOFT) 25 MG TABLET    Take 75 mg by mouth daily.  Modified Medications   No medications on file  Discontinued Medications   No medications on file    Review of Systems  As above. No f/c per nursing. All other systems reviewed are negative.  Filed Vitals:   11/11/14 0857  BP: 132/60  Pulse: 84  Temp: 97.2 F (36.2 C)  Weight: 123 lb (55.792 kg)  SpO2: 97%   Body mass index is 21.79 kg/(m^2).  Physical Exam CONSTITUTIONAL: Looks frail in NAD. Awake, alert and oriented x 3 HEENT: PERRLA. Oropharynx clear and without exudate. MMdry NECK: Supple. Nontender. No palpable cervical or supraclavicular lymph nodes.  CVS: Regular rate with 2/6 SEmurmur. No gallop or rub. LUNGS: CTA b/l no wheezing, rales or rhonchi. ABDOMEN: Bowel sounds present x 4. Soft, nontender, nondistended. No palpable mass or bruit EXTREMITIES: +1 pitting LE edema b/l. Distal pulses palpable. No calf tenderness PSYCH: Affect, behavior and mood normal   Labs reviewed: Orders Only on 11/10/2014  Component Date Value Ref Range Status  . WBC 11/10/2014 10.7* 4.0 - 10.5 K/uL Final  . RBC 11/10/2014 3.30* 3.87 - 5.11 MIL/uL Final  . Hemoglobin 11/10/2014 9.2* 12.0 - 15.0 g/dL Final  . HCT 11/10/2014 29.6* 36.0 - 46.0 % Final  . MCV 11/10/2014 89.7  78.0 - 100.0 fL Final  . MCH 11/10/2014 27.9  26.0 - 34.0 pg Final  . MCHC 11/10/2014 31.1  30.0 - 36.0 g/dL Final  . RDW 11/10/2014 17.1* 11.5 - 15.5 % Final  .  Platelets 11/10/2014 386  150 - 400 K/uL Final  . MPV 11/10/2014 9.0  8.6 - 12.4 fL Final  Admission on 10/19/2014, Discharged on 11/05/2014  No results displayed because visit has over 200 results.     CBC Latest Ref Rng 11/10/2014 11/05/2014 11/04/2014  WBC 4.0 - 10.5 K/uL 10.7(H) 14.8(H) 13.9(H)  Hemoglobin 12.0 - 15.0 g/dL 9.2(L) 8.8(L) 8.8(L)  Hematocrit 36.0 - 46.0 % 29.6(L) 27.7(L) 27.1(L)  Platelets 150 - 400 K/uL 386 477(H) 457(H)    CMP Latest Ref Rng 11/03/2014 10/29/2014 10/28/2014  Glucose 70 - 99 mg/dL 87 92 95  BUN 6 - 23 mg/dL 31(H) 25(H) 20  Creatinine 0.50 - 1.10 mg/dL 0.86 0.80 0.81  Sodium 135 - 145 mmol/L 144 137 137  Potassium 3.5 - 5.1 mmol/L 4.4 4.1 3.9  Chloride 96 - 112 mmol/L 111 105 104  CO2 19 - 32 mmol/L 25 27 27   Calcium 8.4 - 10.5 mg/dL 7.7(L) 7.7(L) 7.7(L)  Total Protein 6.0 - 8.3 g/dL - - -  Total Bilirubin 0.3 - 1.2 mg/dL - - -  Alkaline Phos 39 - 117 U/L - - -  AST 0 - 37 U/L - - -  ALT 0 - 35 U/L - - -      Dg Chest 2 View  10/29/2014   CLINICAL DATA:  Bilateral ankle pain and neck pain. Fever. Weakness.  EXAM: CHEST  2 VIEW  COMPARISON:  10/27/2014  FINDINGS: The patient' s chin and facial tissues obscure parts of the lung apices.  Tortuous and atherosclerotic thoracic aorta. Mild enlargement of the cardiopericardial silhouette  Indistinct vasculature with faint interstitial accentuation in the right upper lobe. Suspected mild atelectasis  along the left lung base.  Degenerative bilateral glenohumeral arthropathy.  IMPRESSION: 1. Mild enlargement of the cardiopericardial silhouette with indistinct pulmonary vasculature potentially read a representing pulmonary venous hypertension. Equivocal interstitial accentuation in the right upper lobe. 2. Mild atelectasis, left lung base. 3. No overt consolidation or overt pulmonary edema. 4. Tortuous and atherosclerotic aortic arch probably contributing to the rightward deviation of the trachea.   Electronically  Signed   By: Sherryl Barters M.D.   On: 10/29/2014 18:01   Dg Chest 2 View  10/27/2014   CLINICAL DATA:  Followup pneumonia, cough, congestion  EXAM: CHEST  2 VIEW  COMPARISON:  10/19/2014  FINDINGS: Cardiomediastinal silhouette is stable. Mild hyperinflation. Improvement in aeration without convincing pulmonary edema or segmental infiltrate. Degenerative changes thoracolumbar spine. Degenerative changes bilateral shoulders.  IMPRESSION: Improvement in aeration without pulmonary edema or segmental infiltrate. Mild hyperinflation. Degenerative changes thoracolumbar spine and bilateral shoulders.   Electronically Signed   By: Lahoma Crocker M.D.   On: 10/27/2014 10:34   Dg Cervical Spine 2 Or 3 Views  10/29/2014   CLINICAL DATA:  Neck pain and fever  EXAM: CERVICAL SPINE - 2-3 VIEW  COMPARISON:  Cervical spine series June 27, 2007 and cervical MRI August 17, 2011  FINDINGS: Frontal and lateral views were obtained. There is no fracture or spondylolisthesis. Prevertebral soft tissues and predental space regions are normal. There is moderately severe disc space narrowing at all levels. There are anterior osteophytes at all levels. There are posterior osteophytes at all levels except for C2. There is no erosive change or bony destruction. There is reversal of the lordotic curvature.  IMPRESSION: Extensive multilevel osteoarthritic change. No fracture or spondylolisthesis. Reversal of the lordotic curvature is probably due to muscle spasm. There has been overall progression of osteoarthritic change compared to prior studies.   Electronically Signed   By: Lowella Grip M.D.   On: 10/29/2014 17:42   Dg Ankle 2 Views Left  10/29/2014   CLINICAL DATA:  Bilateral ankle pain, recent refusal to walk. Fever and weakness.  EXAM: LEFT ANKLE - 2 VIEW  COMPARISON:  05/15/2007  FINDINGS: Diffuse bony demineralization. Vascular calcifications noted. No fracture. No soft tissue swelling over the malleolus. Talar dome  appears intact.  IMPRESSION: 1. No fracture or acute bony findings identified. 2. Sensitivity is significantly reduced due to the severity of bony demineralization. If there is a high index of suspicion of occult bony injury, MRI of the ankle would be suggested.   Electronically Signed   By: Sherryl Barters M.D.   On: 10/29/2014 17:56   Dg Ankle 2 Views Right  10/29/2014   CLINICAL DATA:  Bilateral ankle pain. Fever. Patient refuses to walk.  EXAM: RIGHT ANKLE - 2 VIEW  COMPARISON:  None.  FINDINGS: Bony demineralization. Vascular calcifications. No acute bony findings observed. Degenerative spurring along the posterior subtalar facet.  IMPRESSION: 1. No acute bony findings. 2. Bony demineralization. 3. Degenerative findings along the posterior subtalar facet. 4. Vascular calcifications.   Electronically Signed   By: Sherryl Barters M.D.   On: 10/29/2014 17:58   Dg Chest Port 1 View  11/05/2014   CLINICAL DATA:  Bibasilar crackles.  EXAM: PORTABLE CHEST - 1 VIEW  COMPARISON:  October 29, 2014.  FINDINGS: Stable cardiomediastinal silhouette. No pneumothorax or pleural effusion is noted. Degenerative changes seen involving both glenohumeral joints. Mild central pulmonary vascular congestion is noted. New mild right basilar opacity is noted concerning for subsegmental atelectasis or possibly pneumonia.  IMPRESSION: Mild central pulmonary vascular congestion. New mild right basilar opacity is noted concerning for subsegmental atelectasis or possibly pneumonia. Followup radiographs are recommended.   Electronically Signed   By: Sabino Dick M.D.   On: 11/05/2014 11:23   Dg Chest Port 1 View  10/19/2014   CLINICAL DATA:  Fever, tachycardia, hypertension  EXAM: PORTABLE CHEST - 1 VIEW  COMPARISON:  06/28/2014  FINDINGS: There is bilateral patchy interstitial and alveolar airspace opacities. There is no pleural effusion or pneumothorax. The heart mediastinum are stable. There is no acute osseous abnormality.  There is osteoarthritis of bilateral glenohumeral joints.  IMPRESSION: Bilateral patchy interstitial and alveolar airspace opacities most concerning for multilobar pneumonia including atypical infection.   Electronically Signed   By: Kathreen Devoid   On: 10/19/2014 19:54   Hospital records reviewed  Assessment/Plan   ICD-9-CM ICD-10-CM   1. Anemia, iron deficiency - stable Hgb/Hct 280.9 D50.9   2. Chronic pain syndrome - stable 338.4 G89.4   3.  - controlled 401.9 I10   4. HCAP (healthcare-associated pneumonia) - improving sx's 12 J18.9    --she is medically stable on current tx plan. May consider resuming abx and/or repeating CXR if sx's return. CBC w diff already repeated as above. May repeat in 1 week to follow WBC. Family does not desire w/u of anemia. Will follow H/H and transfuse if necessary. Continue PT/OT/ST as ordered. Continue nutritional supplement. Will follow   Claiborne Stroble S. Perlie Gold  [facility] [address] [place], [place] [address] [phone] Office (Wednesdays and Fridays 8 AM - 5 PM) [phone] Cell (Monday-Friday 8 AM - 5 PM)

## 2014-11-16 ENCOUNTER — Encounter: Payer: Self-pay | Admitting: Internal Medicine

## 2014-11-30 NOTE — Progress Notes (Signed)
Patient ID: [name], female   DOB: 05-Jun-1925, 81 y.o.   MRN: [id number]  [name] living [place]     Allergies  Allergen Reactions  . Codeine     Hallucinations  . Ibuprofen     Confusion       Chief Complaint  Patient presents with  . Hospitalization Follow-up    HPI:  She has been hospitalized for acute blood loss anemia; for which she received blood transfusions; and her family has decided upon no further workup and for pneumonia with sepsis. She is a long term resident of this facility. She states that she is happy to be home. There are no nursing concerns at this time.   Past Medical History  Diagnosis Date  . Hypertension   . Head ache   . Renal insufficiency, mild   . Depression   . Fecal occult blood test positive   . Restless leg syndrome   . Anemia   . Cataracts, bilateral   . Hemorrhoids   . Rupture of rotator cuff of shoulder     s/p repair by Dr. Berenice Primas 7/07. Follows with Dr. Tamera Punt for possible shoulder surgery.   . Lumbar spinal stenosis     gets Spinal injections by Dr. Marlaine Hind   . Postnasal drip   . Atrial tachycardia     echo 04/08/11: EF 55-60%, mild LVH, grade 1 diast dysfxn;    s/p RFCA with Dr. Lovena Le  04/09/11  . Hyperkalemia 04/08/2011  . Unspecified deficiency anemia   . Osteoporosis, unspecified 04/09/2013    DEXA 5/07 : T L forearm -2.9. L femur -0.6, R femur -1.4. [name]  . CVA (cerebral infarction) 04/09/2013    MRI 2005 : Multiple tiny old lacunar infarcts as well as scattered white matter small vessel ischemic disease. Pt asymptomatic and denies TIA / CVA hx.    . Chronic pain syndrome 04/09/2013    On chronic opioids. Cervical MRI 11/ 2012 : Diffuse cervical spondylosis, Diffuse degenerative disc disease with multilevel foraminal stenosis. Possible T1 nerve root involvement. Lumbar MRI 2009 : prior fusion at L3-4 and degenerative disc disease Rotator cuff repair : Dr. Berenice Primas 7/07. Follows with Dr. Tamera Punt for  possible shoulder surgery.  H/O lumbar spinal stenosis with steroid injections. Mo  . Arthritis     b/l shoulder R>L  . Rotator cuff tear arthropathy     03/22/13 dx [facility] (Dr. Tamera Punt) considering surgery  . Stroke   . History of blood transfusion 2014    anemia-     Past Surgical History  Procedure Laterality Date  . Doppler echocardiography  2004  . Rotator cuff repair      right  . Back surgery      lumbar x 2, 2000 and 2004 by Dr. Deri Fuelling.   . Eye surgery Bilateral     cataract  . Abdominal hysterectomy    . Orif finger fracture Left     ring finger  . Lumbar laminectomy/decompression microdiscectomy Left 08/06/2013    Procedure: Left Thoracic eleven-twelve microdiskectomy ;  Surgeon: Charlie Pitter, MD;  Location: [facility];  Service: Neurosurgery;  Laterality: Left;  Left Thoracic eleven-twelve microdiskectomy     VITAL SIGNS BP 139/76 mmHg  Pulse 77  Ht 5\' 2"  (1.575 m)  Wt 123 lb (55.792 kg)  BMI 22.49 kg/m2  SpO2 95%   Outpatient Encounter Prescriptions as of 11/10/2014  Medication Sig  . CALCIUM PO Take 600  mg by mouth 2 (two) times daily.  . carboxymethylcellulose (REFRESH PLUS) 0.5 % SOLN Place 1 drop into the right eye 3 (three) times daily.   . diclofenac sodium (VOLTAREN) 1 % GEL Apply 4 g topically 4 (four) times daily. Bilateral knees  . fentaNYL (DURAGESIC - DOSED MCG/HR) 25 MCG/HR patch Place 1 patch (25 mcg total) onto the skin every 3 (three) days.  . ferrous sulfate 325 (65 FE) MG tablet Take 325 mg by mouth 3 (three) times daily with meals.   . fluticasone (FLONASE) 50 MCG/ACT nasal spray Place 1 spray into both nostrils daily as needed for allergies or rhinitis (congestion).  . gabapentin (NEURONTIN) 600 MG tablet Take 600 mg by mouth 3 (three) times daily.  Marland Kitchen guaiFENesin (MUCINEX) 600 MG 12 hr tablet Take 1 tablet (600 mg total) by mouth 2 (two) times daily.  . Linaclotide (LINZESS) 145 MCG CAPS capsule Take 145 mcg by mouth  daily.  Marland Kitchen loperamide (IMODIUM) 2 MG capsule Take 2 mg by mouth as needed for diarrhea or loose stools.  . Multiple Vitamins-Minerals (DECUBI-VITE PO) Take 1 capsule by mouth daily.   Marland Kitchen saccharomyces boulardii (FLORASTOR) 250 MG capsule Take 1 capsule (250 mg total) by mouth 2 (two) times daily.  . sertraline (ZOLOFT) 25 MG tablet Take 75 mg by mouth daily.     SIGNIFICANT DIAGNOSTIC EXAMS  10-19-14: chest x-ray: Bilateral patchy interstitial and alveolar airspace opacities most concerning for multilobar pneumonia including atypical infection  10-24-14: -d echo: - Left ventricle: The cavity size was normal. There was mild focal basal hypertrophy of the septum. Systolic function was normal. The estimated ejection fraction was in the range of 55% to 60%. Wall motion was normal; there were no regional wall motion abnormalities. Features are consistent with a pseudonormal left ventricular filling pattern, with concomitant abnormal relaxation and increased filling pressure (grade 2 diastolic dysfunction). - Aortic valve: There was mild stenosis.  - Mitral valve: There was moderate regurgitation. - Left atrium: The atrium was moderately dilated. - Atrial septum: There was a possible patent foramen ovale. - Tricuspid valve: There was moderate regurgitation. - Pulmonary arteries: Systolic pressure was severely increased. PA peak pressure: 86 mm Hg (S).  10-27-14: Chest x-ray: Improvement in aeration without pulmonary edema or segmental infiltrate. Mild hyperinflation. Degenerative changes thoracolumbar spine and bilateral shoulders.  10-29-14: right ankle x-ray: 1. No acute bony findings. 2. Bony demineralization. 3. Degenerative findings along the posterior subtalar facet. 4. Vascular calcifications.  10-29-14: left ankle x-ray: 1. No fracture or acute bony findings identified. 2. Sensitivity is significantly reduced due to the severity of bony demineralization. If there is a high index of suspicion of  occult bony injury, MRI of the ankle would be suggested  11-05-14: chest x-ray: Mild central pulmonary vascular congestion. New mild right basilar opacity is noted concerning for subsegmental atelectasis or possibly pneumonia. Followup radiographs are recommended.     LABS REVIEWED:   04-19-14: wbc 7.8; hgb 7.5; hct 23.2; mcv 91.3; plt 252; glucose 98; bun 27; creat 1.11; k+3.7; na++142; liver normal albumin 3.0; EPO 51.7  05-26-14: tsh 0.662; vit b12: 568; folate >20 06-18-14: glucose 81; bun 21; creat 1.2; k+3.6; na++143  06-25-14: sed rate 71 10-19-14: wbc 10.8; hgb 9.7; hct 32.5; mcv 96.2; plt 214; glucose 94; bun 13; creat 0.78; k+4.3;  na++ 137; liver normal albumin 3.2;  10-28-14: wbc 18.0; hgb 8.2; hct 20.2; mcv 88.5; plt 330; glucose 95; bun 20; creat 0.81; k+3.9; na++137 11-03-14: wbc  10.9; hgb 5.4; hct 17.4; mcv 89.2; plt 413 11-04-14 wbc 13.9; hgb 8.8; hct 27.1; mcv 87.1; plt 457 11-10-14: wbc 10.7; hgb 9.2; hct 29.6; mcv 89.7; plt 386      ROS Constitutional: Negative for malaise/fatigue.  Respiratory: Negative for cough and shortness of breath.   Cardiovascular: Negative for chest pain,   Gastrointestinal: Negative for abdominal pain  Musculoskeletal:  no complaint of pain  Skin: Negative.   Psychiatric/Behavioral: The patient is not nervous/anxious.   Physical Exam Constitutional: She appears well-developed and well-nourished. No distress.  Neck: Neck supple. No JVD present. No thyromegaly present.  Cardiovascular: Normal rate, regular rhythm and intact distal pulses.   Respiratory: Effort normal and breath sounds normal. No respiratory distress.  GI: Soft. Bowel sounds are normal. She exhibits no distension. There is no tenderness.  Musculoskeletal: She exhibits edema.  Is able to move all extremities Has 1+ ankle edema present   Skin: She is not diaphoretic.    ASSESSMENT/ PLAN:  1. Chronic pain syndrome: will continue duragesic 25 mcg patch every 3 days; takes  neurontin 600 mg three times daily; will continue voltaren gel 4 gm to knees four times daily;  Will restart vicodin 5/325 mg every 4 hours and every 6 hours as needed s will monitor  2. Anemia: will continue iron three times daily and will monitor  3.  Constipation: will continue linzess 145 mcg daily   4. Gerd: will continue protonix 40 mg daily   5. Depression: she is stable and does receive benefit from zoloft 75 mg daily and will monitor   6. Peripheral neuropathy: is stable will continue neurontin 600 mg three times daily   7. CVA: is neurologically stable is presently not on medications; will not make changes will monitor   8. Pneumonia: will complete levaquin and will monitor her status     Time spent with patient 50 minutes.   Ok Edwards NP [facility]  Contact [phone] Monday through Friday 8am- 5pm  After hours call [phone]

## 2014-12-08 ENCOUNTER — Other Ambulatory Visit: Payer: Self-pay | Admitting: *Deleted

## 2014-12-08 MED ORDER — FENTANYL 25 MCG/HR TD PT72: 25.0000 ug | MEDICATED_PATCH | TRANSDERMAL | Status: DC

## 2014-12-08 NOTE — Telephone Encounter (Signed)
[place] [phone] Fax [phone]

## 2014-12-19 ENCOUNTER — Non-Acute Institutional Stay (SKILLED_NURSING_FACILITY): Payer: Medicare Other | Admitting: Adult Health

## 2014-12-19 DIAGNOSIS — F329 Major depressive disorder, single episode, unspecified: Secondary | ICD-10-CM | POA: Diagnosis not present

## 2014-12-19 DIAGNOSIS — G629 Polyneuropathy, unspecified: Secondary | ICD-10-CM | POA: Diagnosis not present

## 2014-12-19 DIAGNOSIS — F32A Depression, unspecified: Secondary | ICD-10-CM

## 2014-12-19 DIAGNOSIS — K219 Gastro-esophageal reflux disease without esophagitis: Secondary | ICD-10-CM | POA: Diagnosis not present

## 2014-12-19 DIAGNOSIS — K5900 Constipation, unspecified: Secondary | ICD-10-CM | POA: Diagnosis not present

## 2014-12-19 DIAGNOSIS — G894 Chronic pain syndrome: Secondary | ICD-10-CM

## 2014-12-19 DIAGNOSIS — I639 Cerebral infarction, unspecified: Secondary | ICD-10-CM

## 2014-12-19 DIAGNOSIS — D649 Anemia, unspecified: Secondary | ICD-10-CM | POA: Diagnosis not present

## 2014-12-30 ENCOUNTER — Other Ambulatory Visit: Payer: Self-pay | Admitting: *Deleted

## 2014-12-30 MED ORDER — HYDROCODONE-ACETAMINOPHEN 5-325 MG PO TABS: ORAL_TABLET | ORAL | Status: DC

## 2014-12-30 NOTE — Telephone Encounter (Signed)
[facility] 

## 2015-02-20 ENCOUNTER — Non-Acute Institutional Stay (SKILLED_NURSING_FACILITY): Payer: Medicare Other | Admitting: Adult Health

## 2015-02-20 DIAGNOSIS — G894 Chronic pain syndrome: Secondary | ICD-10-CM | POA: Diagnosis not present

## 2015-02-23 ENCOUNTER — Other Ambulatory Visit: Payer: Self-pay | Admitting: *Deleted

## 2015-02-23 MED ORDER — HYDROCODONE-ACETAMINOPHEN 5-325 MG PO TABS: ORAL_TABLET | ORAL | Status: DC

## 2015-02-23 NOTE — Telephone Encounter (Signed)
[facility] 

## 2015-03-07 ENCOUNTER — Encounter: Payer: Self-pay | Admitting: Adult Health

## 2015-03-07 NOTE — Progress Notes (Signed)
Patient ID: [name], female   DOB: 08/12/1925, 81 y.o.   MRN: [id number]  [name] living [place]     Allergies  Allergen Reactions  . Codeine     Hallucinations  . Ibuprofen     Confusion       Chief Complaint  Patient presents with  . Medical Management of Chronic Issues    HPI:  She is a long term resident of this facility being seen for the management of her chronic illnesses. Overall her status remains without significant change. Sh e is not voicing any complaints or concerns today. There are no nursing concerns today.    Past Medical History  Diagnosis Date  . Hypertension   . Head ache   . Renal insufficiency, mild   . Depression   . Fecal occult blood test positive   . Restless leg syndrome   . Anemia   . Cataracts, bilateral   . Hemorrhoids   . Rupture of rotator cuff of shoulder     s/p repair by Dr. Berenice Primas 7/07. Follows with Dr. Tamera Punt for possible shoulder surgery.   . Lumbar spinal stenosis     gets Spinal injections by Dr. Marlaine Hind   . Postnasal drip   . Atrial tachycardia     echo 04/08/11: EF 55-60%, mild LVH, grade 1 diast dysfxn;    s/p RFCA with Dr. Lovena Le  04/09/11  . Hyperkalemia 04/08/2011  . Unspecified deficiency anemia   . Osteoporosis, unspecified 04/09/2013    DEXA 5/07 : T L forearm -2.9. L femur -0.6, R femur -1.4. [name]  . CVA (cerebral infarction) 04/09/2013    MRI 2005 : Multiple tiny old lacunar infarcts as well as scattered white matter small vessel ischemic disease. Pt asymptomatic and denies TIA / CVA hx.    . Chronic pain syndrome 04/09/2013    On chronic opioids. Cervical MRI 11/ 2012 : Diffuse cervical spondylosis, Diffuse degenerative disc disease with multilevel foraminal stenosis. Possible T1 nerve root involvement. Lumbar MRI 2009 : prior fusion at L3-4 and degenerative disc disease Rotator cuff repair : Dr. Berenice Primas 7/07. Follows with Dr. Tamera Punt for possible shoulder surgery.  H/O lumbar spinal  stenosis with steroid injections. Mo  . Arthritis     b/l shoulder R>L  . Rotator cuff tear arthropathy     03/22/13 dx [facility] (Dr. Tamera Punt) considering surgery  . Stroke   . History of blood transfusion 2014    anemia-     Past Surgical History  Procedure Laterality Date  . Doppler echocardiography  2004  . Rotator cuff repair      right  . Back surgery      lumbar x 2, 2000 and 2004 by Dr. Deri Fuelling.   . Eye surgery Bilateral     cataract  . Abdominal hysterectomy    . Orif finger fracture Left     ring finger  . Lumbar laminectomy/decompression microdiscectomy Left 08/06/2013    Procedure: Left Thoracic eleven-twelve microdiskectomy ;  Surgeon: Charlie Pitter, MD;  Location: [facility];  Service: Neurosurgery;  Laterality: Left;  Left Thoracic eleven-twelve microdiskectomy     VITAL SIGNS BP 136/67 mmHg  Pulse 75  Ht 5\' 2"  (1.575 m)  Wt 137 lb (62.143 kg)  BMI 25.05 kg/m2  SpO2 97%   Outpatient Encounter Prescriptions as of 12/19/2014  Medication Sig  . CALCIUM PO Take 60 mg by mouth 2 (two) times daily.  . carboxymethylcellulose (  REFRESH PLUS) 0.5 % SOLN Place 1 drop into the right eye 3 (three) times daily.   . diclofenac sodium (VOLTAREN) 1 % GEL Apply 4 g topically 4 (four) times daily. Bilateral knees  . fentaNYL (DURAGESIC - DOSED MCG/HR) 25 MCG/HR patch Place 1 patch (25 mcg total) onto the skin every 3 (three) days.  . ferrous sulfate 325 (65 FE) MG tablet Take 325 mg by mouth 3 (three) times daily with meals.   . fluticasone (FLONASE) 50 MCG/ACT nasal spray Place 1 spray into both nostrils daily as needed for allergies or rhinitis (congestion).  . gabapentin (NEURONTIN) 600 MG tablet Take 600 mg by mouth 3 (three) times daily.  Marland Kitchen HYDROcodone-acetaminophen (NORCO/VICODIN) 5-325 MG per tablet Take one tablet by mouth every 6 hours as needed for pain. Not to exceed 3000mg  of APAP from all sources/24hr  . Linaclotide (LINZESS) 145 MCG CAPS capsule  Take 145 mcg by mouth daily.  Marland Kitchen loperamide (IMODIUM) 2 MG capsule Take 2 mg by mouth as needed for diarrhea or loose stools.  . Multiple Vitamins-Minerals (DECUBI-VITE PO) Take 1 capsule by mouth daily.   . sertraline (ZOLOFT) 25 MG tablet Take 50 mg by mouth daily.       SIGNIFICANT DIAGNOSTIC EXAMS   10-19-14: chest x-ray: Bilateral patchy interstitial and alveolar airspace opacities most concerning for multilobar pneumonia including atypical infection  10-24-14: -d echo: - Left ventricle: The cavity size was normal. There was mild focal basal hypertrophy of the septum. Systolic function was normal. The estimated ejection fraction was in the range of 55% to 60%. Wall motion was normal; there were no regional wall motion abnormalities. Features are consistent with a pseudonormal left ventricular filling pattern, with concomitant abnormal relaxation and increased filling pressure (grade 2 diastolic dysfunction). - Aortic valve: There was mild stenosis.  - Mitral valve: There was moderate regurgitation. - Left atrium: The atrium was moderately dilated. - Atrial septum: There was a possible patent foramen ovale. - Tricuspid valve: There was moderate regurgitation. - Pulmonary arteries: Systolic pressure was severely increased. PA peak pressure: 86 mm Hg (S).  10-27-14: Chest x-ray: Improvement in aeration without pulmonary edema or segmental infiltrate. Mild hyperinflation. Degenerative changes thoracolumbar spine and bilateral shoulders.  10-29-14: right ankle x-ray: 1. No acute bony findings. 2. Bony demineralization. 3. Degenerative findings along the posterior subtalar facet. 4. Vascular calcifications.  10-29-14: left ankle x-ray: 1. No fracture or acute bony findings identified. 2. Sensitivity is significantly reduced due to the severity of bony demineralization. If there is a high index of suspicion of occult bony injury, MRI of the ankle would be suggested  11-05-14: chest x-ray: Mild  central pulmonary vascular congestion. New mild right basilar opacity is noted concerning for subsegmental atelectasis or possibly pneumonia. Followup radiographs are recommended.     LABS REVIEWED:   04-19-14: wbc 7.8; hgb 7.5; hct 23.2; mcv 91.3; plt 252; glucose 98; bun 27; creat 1.11; k+3.7; na++142; liver normal albumin 3.0; EPO 51.7  05-26-14: tsh 0.662; vit b12: 568; folate >20 06-18-14: glucose 81; bun 21; creat 1.2; k+3.6; na++143  06-25-14: sed rate 71 10-19-14: wbc 10.8; hgb 9.7; hct 32.5; mcv 96.2; plt 214; glucose 94; bun 13; creat 0.78; k+4.3;  na++ 137; liver normal albumin 3.2;  10-28-14: wbc 18.0; hgb 8.2; hct 20.2; mcv 88.5; plt 330; glucose 95; bun 20; creat 0.81; k+3.9; na++137 11-03-14: wbc 10.9; hgb 5.4; hct 17.4; mcv 89.2; plt 413 11-04-14 wbc 13.9; hgb 8.8; hct 27.1; mcv 87.1; plt  457 11-10-14: wbc 10.7; hgb 9.2; hct 29.6; mcv 89.7; plt 386      ROS Constitutional: Negative for malaise/fatigue.  Respiratory: Negative for cough and shortness of breath.   Cardiovascular: Negative for chest pain,   Gastrointestinal: Negative for abdominal pain  Musculoskeletal:  no complaint of pain  Skin: Negative.   Psychiatric/Behavioral: The patient is not nervous/anxious.     Physical Exam Constitutional: She appears well-developed and well-nourished. No distress.  Neck: Neck supple. No JVD present. No thyromegaly present.  Cardiovascular: Normal rate, regular rhythm and intact distal pulses.   Respiratory: Effort normal and breath sounds normal. No respiratory distress.  GI: Soft. Bowel sounds are normal. She exhibits no distension. There is no tenderness.  Musculoskeletal: She exhibits edema.  Is able to move all extremities Has 1+ ankle edema present   Skin: She is not diaphoretic.      ASSESSMENT/ PLAN:   1. Chronic pain syndrome: will continue duragesic 25 mcg patch every 3 days; takes neurontin 600 mg three times daily; will continue voltaren gel 4 gm to knees  four times daily;  Will continue  vicodin 5/325 mg every 6 hours as needed s will monitor  2. Anemia: will continue iron three times daily and will monitor hgb is 9.2  3.  Constipation: will continue linzess 145 mcg daily   4. Gerd: will continue protonix 40 mg daily   5. Depression: she is stable and does receive benefit from zoloft 50 mg daily and will monitor   6. Peripheral neuropathy: is stable will continue neurontin 600 mg three times daily   7. CVA: is neurologically stable is presently not on medications; will not make changes will monitor    Will check cmp   Ok Edwards NP [facility] Adult Medicine  Contact [phone] Monday through Friday 8am- 5pm  After hours call [phone]

## 2015-03-20 ENCOUNTER — Non-Acute Institutional Stay (SKILLED_NURSING_FACILITY): Payer: Medicare Other | Admitting: Adult Health

## 2015-03-20 DIAGNOSIS — G629 Polyneuropathy, unspecified: Secondary | ICD-10-CM | POA: Diagnosis not present

## 2015-03-20 DIAGNOSIS — D649 Anemia, unspecified: Secondary | ICD-10-CM

## 2015-03-20 DIAGNOSIS — I639 Cerebral infarction, unspecified: Secondary | ICD-10-CM

## 2015-03-20 DIAGNOSIS — G894 Chronic pain syndrome: Secondary | ICD-10-CM | POA: Diagnosis not present

## 2015-03-20 DIAGNOSIS — F329 Major depressive disorder, single episode, unspecified: Secondary | ICD-10-CM | POA: Diagnosis not present

## 2015-03-20 DIAGNOSIS — K219 Gastro-esophageal reflux disease without esophagitis: Secondary | ICD-10-CM | POA: Diagnosis not present

## 2015-03-20 DIAGNOSIS — F32A Depression, unspecified: Secondary | ICD-10-CM

## 2015-04-12 NOTE — Progress Notes (Signed)
Patient ID: [name], female   DOB: [birth year], 81 y.o.   MRN: [id number]  [name] living [place]     Allergies  Allergen Reactions  . Codeine     Hallucinations  . Ibuprofen     Confusion       Chief Complaint  Patient presents with  . Acute Visit    shoulder pain     HPI:  She has bilateral shoulder pain. She states the pain is achy in nature and states she is not getting relief from her current regimen. She is willing to try therapy to help with her pain management.    Past Medical History  Diagnosis Date  . Hypertension   . Head ache   . Renal insufficiency, mild   . Depression   . Fecal occult blood test positive   . Restless leg syndrome   . Anemia   . Cataracts, bilateral   . Hemorrhoids   . Rupture of rotator cuff of shoulder     s/p repair by Dr. Berenice Primas 7/07. Follows with Dr. Tamera Punt for possible shoulder surgery.   . Lumbar spinal stenosis     gets Spinal injections by Dr. Marlaine Hind   . Postnasal drip   . Atrial tachycardia     echo 04/08/11: EF 55-60%, mild LVH, grade 1 diast dysfxn;    s/p RFCA with Dr. Lovena Le  04/09/11  . Hyperkalemia 04/08/2011  . Unspecified deficiency anemia   . Osteoporosis, unspecified 04/09/2013    DEXA 5/07 : T L forearm -2.9. L femur -0.6, R femur -1.4. [name]  . CVA (cerebral infarction) 04/09/2013    MRI 2005 : Multiple tiny old lacunar infarcts as well as scattered white matter small vessel ischemic disease. Pt asymptomatic and denies TIA / CVA hx.    . Chronic pain syndrome 04/09/2013    On chronic opioids. Cervical MRI 11/ 2012 : Diffuse cervical spondylosis, Diffuse degenerative disc disease with multilevel foraminal stenosis. Possible T1 nerve root involvement. Lumbar MRI 2009 : prior fusion at L3-4 and degenerative disc disease Rotator cuff repair : Dr. Berenice Primas 7/07. Follows with Dr. Tamera Punt for possible shoulder surgery.  H/O lumbar spinal stenosis with steroid injections. Mo  . Arthritis     b/l  shoulder R>L  . Rotator cuff tear arthropathy     03/22/13 dx [facility] (Dr. Tamera Punt) considering surgery  . Stroke   . History of blood transfusion 2014    anemia-     Past Surgical History  Procedure Laterality Date  . Doppler echocardiography  2004  . Rotator cuff repair      right  . Back surgery      lumbar x 2, 2000 and 2004 by Dr. Deri Fuelling.   . Eye surgery Bilateral     cataract  . Abdominal hysterectomy    . Orif finger fracture Left     ring finger  . Lumbar laminectomy/decompression microdiscectomy Left 08/06/2013    Procedure: Left Thoracic eleven-twelve microdiskectomy ;  Surgeon: Charlie Pitter, MD;  Location: [facility];  Service: Neurosurgery;  Laterality: Left;  Left Thoracic eleven-twelve microdiskectomy     VITAL SIGNS BP 156/74 mmHg  Pulse 70  Ht 5\' 2"  (1.575 m)  Wt 143 lb (64.864 kg)  BMI 26.15 kg/m2   Outpatient Encounter Prescriptions as of 02/20/2015  Medication Sig  . CALCIUM PO Take 60 mg by mouth 2 (two) times daily.  . carboxymethylcellulose (REFRESH PLUS) 0.5 % SOLN  Place 1 drop into the right eye 3 (three) times daily.   . diclofenac sodium (VOLTAREN) 1 % GEL Apply 4 g topically 4 (four) times daily. Bilateral knees  . fentaNYL (DURAGESIC - DOSED MCG/HR) 25 MCG/HR patch Place 1 patch (25 mcg total) onto the skin every 3 (three) days.  . ferrous sulfate 325 (65 FE) MG tablet Take 325 mg by mouth 3 (three) times daily with meals.   . fluticasone (FLONASE) 50 MCG/ACT nasal spray Place 1 spray into both nostrils daily as needed for allergies or rhinitis (congestion).  . gabapentin (NEURONTIN) 600 MG tablet Take 600 mg by mouth 3 (three) times daily.  . Linaclotide (LINZESS) 145 MCG CAPS capsule Take 145 mcg by mouth daily.  Marland Kitchen loperamide (IMODIUM) 2 MG capsule Take 2 mg by mouth as needed for diarrhea or loose stools.  . Multiple Vitamins-Minerals (DECUBI-VITE PO) Take 1 capsule by mouth daily.   . sertraline (ZOLOFT) 25 MG tablet Take  50 mg by mouth daily.       SIGNIFICANT DIAGNOSTIC EXAMS  10-19-14: chest x-ray: Bilateral patchy interstitial and alveolar airspace opacities most concerning for multilobar pneumonia including atypical infection  10-24-14: -d echo: - Left ventricle: The cavity size was normal. There was mild focal basal hypertrophy of the septum. Systolic function was normal. The estimated ejection fraction was in the range of 55% to 60%. Wall motion was normal; there were no regional wall motion abnormalities. Features are consistent with a pseudonormal left ventricular filling pattern, with concomitant abnormal relaxation and increased filling pressure (grade 2 diastolic dysfunction). - Aortic valve: There was mild stenosis.  - Mitral valve: There was moderate regurgitation. - Left atrium: The atrium was moderately dilated. - Atrial septum: There was a possible patent foramen ovale. - Tricuspid valve: There was moderate regurgitation. - Pulmonary arteries: Systolic pressure was severely increased. PA peak pressure: 86 mm Hg (S).  10-27-14: Chest x-ray: Improvement in aeration without pulmonary edema or segmental infiltrate. Mild hyperinflation. Degenerative changes thoracolumbar spine and bilateral shoulders.  10-29-14: right ankle x-ray: 1. No acute bony findings. 2. Bony demineralization. 3. Degenerative findings along the posterior subtalar facet. 4. Vascular calcifications.  10-29-14: left ankle x-ray: 1. No fracture or acute bony findings identified. 2. Sensitivity is significantly reduced due to the severity of bony demineralization. If there is a high index of suspicion of occult bony injury, MRI of the ankle would be suggested  11-05-14: chest x-ray: Mild central pulmonary vascular congestion. New mild right basilar opacity is noted concerning for subsegmental atelectasis or possibly pneumonia. Followup radiographs are recommended.     LABS REVIEWED:   04-19-14: wbc 7.8; hgb 7.5; hct 23.2; mcv  91.3; plt 252; glucose 98; bun 27; creat 1.11; k+3.7; na++142; liver normal albumin 3.0; EPO 51.7  05-26-14: tsh 0.662; vit b12: 568; folate >20 06-18-14: glucose 81; bun 21; creat 1.2; k+3.6; na++143  06-25-14: sed rate 71 10-19-14: wbc 10.8; hgb 9.7; hct 32.5; mcv 96.2; plt 214; glucose 94; bun 13; creat 0.78; k+4.3;  na++ 137; liver normal albumin 3.2;  10-28-14: wbc 18.0; hgb 8.2; hct 20.2; mcv 88.5; plt 330; glucose 95; bun 20; creat 0.81; k+3.9; na++137 11-03-14: wbc 10.9; hgb 5.4; hct 17.4; mcv 89.2; plt 413 11-04-14 wbc 13.9; hgb 8.8; hct 27.1; mcv 87.1; plt 457 11-10-14: wbc 10.7; hgb 9.2; hct 29.6; mcv 89.7; plt 386      ROS Constitutional: Negative for malaise/fatigue.  Respiratory: Negative for cough and shortness of breath.   Cardiovascular: Negative  for chest pain,   Gastrointestinal: Negative for abdominal pain  Musculoskeletal:  has bilateral shoulder pain   Skin: Negative.   Psychiatric/Behavioral: The patient is not nervous/anxious.    Physical Exam Constitutional: She appears well-developed and well-nourished. No distress.  Neck: Neck supple. No JVD present. No thyromegaly present.  Cardiovascular: Normal rate, regular rhythm and intact distal pulses.   Respiratory: Effort normal and breath sounds normal. No respiratory distress.  GI: Soft. Bowel sounds are normal. She exhibits no distension. There is no tenderness.  Musculoskeletal: She exhibits edema.  Is able to move all extremities Has crepitus to bilateral shoulders  Has 1+ ankle edema present   Skin: She is not diaphoretic.      ASSESSMENT/ PLAN:  1. Chronic pain syndrome: will continue neurontin 600 mg three times daily; will continue voltaren gel 4 gm to knees four times daily;  Will continue  vicodin 5/325 mg every 6 hours as needed  Will increase her duragesic patch to 37.5 mcg every 3 days and will have therapy evaluate and treat as indcated for her shoulder pain.      Ok Edwards NP [facility]  Adult Medicine  Contact [phone] Monday through Friday 8am- 5pm  After hours call [phone]

## 2015-04-13 MED ORDER — FENTANYL 25 MCG/HR TD PT72
37.5000 ug | MEDICATED_PATCH | TRANSDERMAL | Status: DC
Start: 2015-04-13 — End: 2015-09-03

## 2015-04-17 ENCOUNTER — Non-Acute Institutional Stay (SKILLED_NURSING_FACILITY): Payer: Medicare Other | Admitting: Adult Health

## 2015-04-17 DIAGNOSIS — G894 Chronic pain syndrome: Secondary | ICD-10-CM

## 2015-04-17 DIAGNOSIS — F329 Major depressive disorder, single episode, unspecified: Secondary | ICD-10-CM

## 2015-04-17 DIAGNOSIS — G6289 Other specified polyneuropathies: Secondary | ICD-10-CM

## 2015-04-17 DIAGNOSIS — D649 Anemia, unspecified: Secondary | ICD-10-CM

## 2015-04-17 DIAGNOSIS — K219 Gastro-esophageal reflux disease without esophagitis: Secondary | ICD-10-CM | POA: Diagnosis not present

## 2015-04-17 DIAGNOSIS — I639 Cerebral infarction, unspecified: Secondary | ICD-10-CM

## 2015-04-17 DIAGNOSIS — F32A Depression, unspecified: Secondary | ICD-10-CM

## 2015-04-21 ENCOUNTER — Other Ambulatory Visit: Payer: Self-pay | Admitting: *Deleted

## 2015-04-21 MED ORDER — HYDROCODONE-ACETAMINOPHEN 5-325 MG PO TABS: ORAL_TABLET | ORAL | Status: DC

## 2015-04-21 NOTE — Telephone Encounter (Signed)
Combine GL GBO

## 2015-05-04 ENCOUNTER — Non-Acute Institutional Stay (SKILLED_NURSING_FACILITY): Payer: Medicare Other | Admitting: Adult Health

## 2015-05-04 DIAGNOSIS — I10 Essential (primary) hypertension: Secondary | ICD-10-CM

## 2015-05-11 ENCOUNTER — Encounter: Payer: Self-pay | Admitting: Adult Health

## 2015-05-11 NOTE — Progress Notes (Signed)
Patient ID: [name], female   DOB: 1924/11/26, 81 y.o.   MRN: [id number]   Facility: [facility]      Allergies  Allergen Reactions  . Codeine     Hallucinations  . Ibuprofen     Confusion    Chief Complaint  Patient presents with  . Medical Management of Chronic Issues    HPI:  She is a long term resident of this facility being seen for the management of her chronic illnesses. She is without change in her status. She is unable to fully participate in the hpi or ros. There are no nursing concerns at this time.    Past Medical History  Diagnosis Date  . Hypertension   . Head ache   . Renal insufficiency, mild   . Depression   . Fecal occult blood test positive   . Restless leg syndrome   . Anemia   . Cataracts, bilateral   . Hemorrhoids   . Rupture of rotator cuff of shoulder     s/p repair by Dr. Berenice Primas 7/07. Follows with Dr. Tamera Punt for possible shoulder surgery.   . Lumbar spinal stenosis     gets Spinal injections by Dr. Marlaine Hind   . Postnasal drip   . Atrial tachycardia     echo 04/08/11: EF 55-60%, mild LVH, grade 1 diast dysfxn;    s/p RFCA with Dr. Lovena Le  04/09/11  . Hyperkalemia 04/08/2011  . Unspecified deficiency anemia   . Osteoporosis, unspecified 04/09/2013    DEXA 5/07 : T L forearm -2.9. L femur -0.6, R femur -1.4. [name]  . CVA (cerebral infarction) 04/09/2013    MRI 2005 : Multiple tiny old lacunar infarcts as well as scattered white matter small vessel ischemic disease. Pt asymptomatic and denies TIA / CVA hx.    . Chronic pain syndrome 04/09/2013    On chronic opioids. Cervical MRI 11/ 2012 : Diffuse cervical spondylosis, Diffuse degenerative disc disease with multilevel foraminal stenosis. Possible T1 nerve root involvement. Lumbar MRI 2009 : prior fusion at L3-4 and degenerative disc disease Rotator cuff repair : Dr. Berenice Primas 7/07. Follows with Dr. Tamera Punt for possible shoulder surgery.  H/O lumbar spinal stenosis  with steroid injections. Mo  . Arthritis     b/l shoulder R>L  . Rotator cuff tear arthropathy     03/22/13 dx [facility] (Dr. Tamera Punt) considering surgery  . Stroke   . History of blood transfusion 2014    anemia-     Past Surgical History  Procedure Laterality Date  . Doppler echocardiography  2004  . Rotator cuff repair      right  . Back surgery      lumbar x 2, 2000 and 2004 by Dr. Deri Fuelling.   . Eye surgery Bilateral     cataract  . Abdominal hysterectomy    . Orif finger fracture Left     ring finger  . Lumbar laminectomy/decompression microdiscectomy Left 08/06/2013    Procedure: Left Thoracic eleven-twelve microdiskectomy ;  Surgeon: Charlie Pitter, MD;  Location: [facility];  Service: Neurosurgery;  Laterality: Left;  Left Thoracic eleven-twelve microdiskectomy     VITAL SIGNS BP 132/82 mmHg  Pulse 80  Ht 5\' 2"  (1.575 m)  Wt 137 lb (62.143 kg)  BMI 25.05 kg/m2  Patient's Medications  New Prescriptions   No medications on file  Previous Medications   CALCIUM PO    Take 60 mg by mouth  2 (two) times daily.   CARBOXYMETHYLCELLULOSE (REFRESH PLUS) 0.5 % SOLN    Place 1 drop into the right eye 3 (three) times daily.    DICLOFENAC SODIUM (VOLTAREN) 1 % GEL    Apply 4 g topically 4 (four) times daily. Bilateral knees   FENTANYL (DURAGESIC - DOSED MCG/HR) 25 MCG/HR PATCH    37.5 mcg patch every 3 days   FERROUS SULFATE 325 (65 FE) MG TABLET    Take 325 mg by mouth 3 (three) times daily with meals.    FLUTICASONE (FLONASE) 50 MCG/ACT NASAL SPRAY    Place 1 spray into both nostrils daily as needed for allergies or rhinitis (congestion).   GABAPENTIN (NEURONTIN) 600 MG TABLET    Take 600 mg by mouth 3 (three) times daily.   HYDROCODONE-ACETAMINOPHEN (NORCO/VICODIN) 5-325 MG PER TABLET    Take one tablet by mouth every 6 hours as needed for pain. Not to exceed 3000mg  of APAP from all sources/24hr   LINACLOTIDE (LINZESS) 145 MCG CAPS CAPSULE    Take 145 mcg by  mouth daily.   LOPERAMIDE (IMODIUM) 2 MG CAPSULE    Take 2 mg by mouth as needed for diarrhea or loose stools.   MULTIPLE VITAMINS-MINERALS (DECUBI-VITE PO)    Take 1 capsule by mouth daily.    SERTRALINE (ZOLOFT) 25 MG TABLET    Take 75 mg by mouth daily.   Modified Medications   No medications on file  Discontinued Medications   No medications on file     SIGNIFICANT DIAGNOSTIC EXAMS   10-19-14: chest x-ray: Bilateral patchy interstitial and alveolar airspace opacities most concerning for multilobar pneumonia including atypical infection  10-24-14: -d echo: - Left ventricle: The cavity size was normal. There was mild focal basal hypertrophy of the septum. Systolic function was normal. The estimated ejection fraction was in the range of 55% to 60%. Wall motion was normal; there were no regional wall motion abnormalities. Features are consistent with a pseudonormal left ventricular filling pattern, with concomitant abnormal relaxation and increased filling pressure (grade 2 diastolic dysfunction). - Aortic valve: There was mild stenosis.  - Mitral valve: There was moderate regurgitation. - Left atrium: The atrium was moderately dilated. - Atrial septum: There was a possible patent foramen ovale. - Tricuspid valve: There was moderate regurgitation. - Pulmonary arteries: Systolic pressure was severely increased. PA peak pressure: 86 mm Hg (S).  10-27-14: Chest x-ray: Improvement in aeration without pulmonary edema or segmental infiltrate. Mild hyperinflation. Degenerative changes thoracolumbar spine and bilateral shoulders.  10-29-14: right ankle x-ray: 1. No acute bony findings. 2. Bony demineralization. 3. Degenerative findings along the posterior subtalar facet. 4. Vascular calcifications.  10-29-14: left ankle x-ray: 1. No fracture or acute bony findings identified. 2. Sensitivity is significantly reduced due to the severity of bony demineralization. If there is a high index of suspicion  of occult bony injury, MRI of the ankle would be suggested  11-05-14: chest x-ray: Mild central pulmonary vascular congestion. New mild right basilar opacity is noted concerning for subsegmental atelectasis or possibly pneumonia. Followup radiographs are recommended.     LABS REVIEWED:   04-19-14: wbc 7.8; hgb 7.5; hct 23.2; mcv 91.3; plt 252; glucose 98; bun 27; creat 1.11; k+3.7; na++142; liver normal albumin 3.0; EPO 51.7  05-26-14: tsh 0.662; vit b12: 568; folate >20 06-18-14: glucose 81; bun 21; creat 1.2; k+3.6; na++143  06-25-14: sed rate 71 10-19-14: wbc 10.8; hgb 9.7; hct 32.5; mcv 96.2; plt 214; glucose 94; bun 13; creat  0.78; k+4.3;  na++ 137; liver normal albumin 3.2;  10-28-14: wbc 18.0; hgb 8.2; hct 20.2; mcv 88.5; plt 330; glucose 95; bun 20; creat 0.81; k+3.9; na++137 11-03-14: wbc 10.9; hgb 5.4; hct 17.4; mcv 89.2; plt 413 11-04-14 wbc 13.9; hgb 8.8; hct 27.1; mcv 87.1; plt 457 11-10-14: wbc 10.7; hgb 9.2; hct 29.6; mcv 89.7; plt 386      Review of Systems  Unable to perform ROS: Dementia      Physical Exam  Constitutional: No distress.  Eyes: Conjunctivae are normal.  Neck: Neck supple. No JVD present. No thyromegaly present.  Cardiovascular: Normal rate, regular rhythm and intact distal pulses.   Respiratory: Effort normal and breath sounds normal. No respiratory distress. She has no wheezes.  GI: Soft. Bowel sounds are normal. She exhibits no distension. There is no tenderness.  Musculoskeletal: She exhibits no edema.  Able to move all extremities   Lymphadenopathy:    She has no cervical adenopathy.  Neurological: She is alert.  Skin: Skin is warm and dry. She is not diaphoretic.  Psychiatric: She has a normal mood and affect.     ASSESSMENT/ PLAN:  1. Chronic pain syndrome: will continue duragesic 37.5 mcg patch every 3 days; takes neurontin 600 mg three times daily; will continue voltaren gel 4 gm to knees four times daily;  Will continue  vicodin 5/325 mg  every 6 hours as needed  will monitor  2. Anemia: will continue iron three times daily and will monitor hgb is 9.2  3.  Constipation: will continue linzess 145 mcg daily   4. Gerd: will continue protonix 40 mg daily   5. Depression: she is stable and does receive benefit from zoloft 50 mg daily and will monitor   6. Peripheral neuropathy: is stable will continue neurontin 600 mg three times daily   7. CVA: is neurologically stable is presently not on medications; will not make changes will monitor      Ok Edwards NP [facility]  Contact [phone] Monday through Friday 8am- 5pm  After hours call [phone]

## 2015-06-01 ENCOUNTER — Other Ambulatory Visit: Payer: Self-pay | Admitting: *Deleted

## 2015-06-01 MED ORDER — HYDROCODONE-ACETAMINOPHEN 7.5-325 MG PO TABS: ORAL_TABLET | ORAL | Status: DC

## 2015-06-01 NOTE — Telephone Encounter (Signed)
[facility] 

## 2015-06-02 ENCOUNTER — Non-Acute Institutional Stay (SKILLED_NURSING_FACILITY): Payer: Medicare Other | Admitting: Internal Medicine

## 2015-06-02 ENCOUNTER — Encounter: Payer: Self-pay | Admitting: Internal Medicine

## 2015-06-02 DIAGNOSIS — K5900 Constipation, unspecified: Secondary | ICD-10-CM

## 2015-06-02 DIAGNOSIS — Z8673 Personal history of transient ischemic attack (TIA), and cerebral infarction without residual deficits: Secondary | ICD-10-CM | POA: Diagnosis not present

## 2015-06-02 DIAGNOSIS — F32A Depression, unspecified: Secondary | ICD-10-CM

## 2015-06-02 DIAGNOSIS — G6289 Other specified polyneuropathies: Secondary | ICD-10-CM

## 2015-06-02 DIAGNOSIS — M2550 Pain in unspecified joint: Secondary | ICD-10-CM

## 2015-06-02 DIAGNOSIS — G894 Chronic pain syndrome: Secondary | ICD-10-CM | POA: Diagnosis not present

## 2015-06-02 DIAGNOSIS — I10 Essential (primary) hypertension: Secondary | ICD-10-CM

## 2015-06-02 DIAGNOSIS — F329 Major depressive disorder, single episode, unspecified: Secondary | ICD-10-CM

## 2015-06-02 NOTE — Progress Notes (Signed)
Patient ID: [name], female   DOB: 09/23/1925, 81 y.o.   MRN: [id number]    DATE: 06/02/15  Location:  [facility] [facility] Service: SNF [phone])   Extended Emergency Contact Information Primary Emergency Contact: Hunkins,Thomas Address: [address]          [place],  [address] [address] [address] of [place] Phone: [phone] Mobile Phone: [phone] Relation: Son Secondary Emergency Contact: Davis,Saranetti  [place] [employer] Phone: [phone] Relation: Other  Advanced Directive information   FULL CODE  Chief Complaint  Patient presents with  . Medical Management of Chronic Issues    joint stiffness    HPI:  81 yo female long term resident seen today for f/u. [name] generalized pain in her joints but mostly shoulders, neck, L>R knee. No stiffness. She also reports loose stools. No bloody stools. No nursing issues. No falls.   Chronic pain syndrome - takes duragesic 37.5 mcg patch every 3 days; neurontin 600 mg three times daily; voltaren gel 4 gm to knees four times daily; vicodin 5/325 mg every 6 hours as needed  Anemia Hgb stable on iron. last hgb  9.2  Chronic Constipation controlled on linzess 145 mcg daily   GERD - sx's controlled on protonix   Depression - mood stable on zoloft   Peripheral neuropathy - stable on neurontin 600 mg three times daily   Hx CVA -  Stable on no meds  Past Medical History  Diagnosis Date  . Hypertension   . Head ache   . Renal insufficiency, mild   . Depression   . Fecal occult blood test positive   . Restless leg syndrome   . Anemia   . Cataracts, bilateral   . Hemorrhoids   . Rupture of rotator cuff of shoulder     s/p repair by Dr. Berenice Primas 7/07. Follows with Dr. Tamera Punt for possible shoulder surgery.   . Lumbar spinal stenosis     gets Spinal injections by Dr. Marlaine Hind   . Postnasal drip   . Atrial tachycardia     echo 04/08/11: EF 55-60%, mild LVH, grade 1 diast  dysfxn;    s/p RFCA with Dr. Lovena Le  04/09/11  . Hyperkalemia 04/08/2011  . Unspecified deficiency anemia   . Osteoporosis, unspecified 04/09/2013    DEXA 5/07 : T L forearm -2.9. L femur -0.6, R femur -1.4. [name]  . CVA (cerebral infarction) 04/09/2013    MRI 2005 : Multiple tiny old lacunar infarcts as well as scattered white matter small vessel ischemic disease. Pt asymptomatic and denies TIA / CVA hx.    . Chronic pain syndrome 04/09/2013    On chronic opioids. Cervical MRI 11/ 2012 : Diffuse cervical spondylosis, Diffuse degenerative disc disease with multilevel foraminal stenosis. Possible T1 nerve root involvement. Lumbar MRI 2009 : prior fusion at L3-4 and degenerative disc disease Rotator cuff repair : Dr. Berenice Primas 7/07. Follows with Dr. Tamera Punt for possible shoulder surgery.  H/O lumbar spinal stenosis with steroid injections. Mo  . Arthritis     b/l shoulder R>L  . Rotator cuff tear arthropathy     03/22/13 dx [facility] (Dr. Tamera Punt) considering surgery  . Stroke   . History of blood transfusion 2014    anemia-     Past Surgical History  Procedure Laterality Date  . Doppler echocardiography  2004  . Rotator cuff repair      right  .  Back surgery      lumbar x 2, 2000 and 2004 by Dr. Deri Fuelling.   . Eye surgery Bilateral     cataract  . Abdominal hysterectomy    . Orif finger fracture Left     ring finger  . Lumbar laminectomy/decompression microdiscectomy Left 08/06/2013    Procedure: Left Thoracic eleven-twelve microdiskectomy ;  Surgeon: Charlie Pitter, MD;  Location: [facility];  Service: Neurosurgery;  Laterality: Left;  Left Thoracic eleven-twelve microdiskectomy     Patient Care Team: [name], DO as PCP - General (Internal Medicine) Juanita Craver, MD as Attending Physician (Gastroenterology)  Social History   Social History  . Marital Status: Single    Spouse Name: N/A  . Number of Children: 1  . Years of Education: N/A    Occupational History  .     Social History Main Topics  . Smoking status: Never Smoker   . Smokeless tobacco: Not on file  . Alcohol Use: No  . Drug Use: No  . Sexual Activity: Not on file   Other Topics Concern  . Not on file   Social History Narrative   Originally from [place]. She has been here in the [place] since the 60's.   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        reports that she has never smoked. She does not have any smokeless tobacco history on file. She reports that she does not drink alcohol or use illicit drugs.  Immunization History  Administered Date(s) Administered  . Influenza Split 07/11/2011, 10/20/2011, 07/30/2012  . Influenza Whole 08/16/2007, 06/09/2010  . Influenza,inj,Quad PF,36+ Mos 06/27/2013  . Influenza-Unspecified 07/23/2014  . PPD Test 08/12/2013  . Pneumococcal Polysaccharide-23 07/11/2011  . Pneumococcal-Unspecified 07/28/2014  . Td 06/18/2009  . Tdap 06/28/2014    Allergies  Allergen Reactions  . Codeine     Hallucinations  . Ibuprofen     Confusion    Medications: Patient's Medications  New Prescriptions   No medications on file  Previous Medications   CALCIUM PO    Take 60 mg by mouth 2 (two) times daily.   CARBOXYMETHYLCELLULOSE (REFRESH PLUS) 0.5 % SOLN    Place 1 drop into the right eye 3 (three) times daily.    DICLOFENAC SODIUM (VOLTAREN) 1 % GEL    Apply 4 g topically 4 (four) times daily. Bilateral knees   FENTANYL (DURAGESIC - DOSED MCG/HR) 25 MCG/HR PATCH    Place 2 patches (50 mcg total) onto the skin every 3 (three) days.   FERROUS SULFATE 325 (65 FE) MG TABLET    Take 325 mg by mouth 3 (three) times daily with meals.    FLUTICASONE (FLONASE) 50 MCG/ACT NASAL SPRAY    Place 1 spray into both nostrils daily as needed for allergies or rhinitis (congestion).   GABAPENTIN (NEURONTIN) 600 MG TABLET    Take 600  mg by mouth 3 (three) times daily.   HYDROCODONE-ACETAMINOPHEN (NORCO) 7.5-325 MG PER TABLET    Take one tablet by mouth every 6 hours routinely and take one tablet by mouth twice daily as needed for pain. Not to exceed 3000mg  of APAP from all sources/24hours   HYDROCODONE-ACETAMINOPHEN (NORCO/VICODIN) 5-325 MG PER TABLET    Take one tablet by mouth every 6 hours as needed for pain. Not to exceed 3000mg  of  APAP from all sources/24hr   LINACLOTIDE (LINZESS) 145 MCG CAPS CAPSULE    Take 145 mcg by mouth daily.   LOPERAMIDE (IMODIUM) 2 MG CAPSULE    Take 2 mg by mouth as needed for diarrhea or loose stools.   MULTIPLE VITAMINS-MINERALS (DECUBI-VITE PO)    Take 1 capsule by mouth daily.    SERTRALINE (ZOLOFT) 25 MG TABLET    Take 75 mg by mouth daily.   Modified Medications   No medications on file  Discontinued Medications   No medications on file    Review of Systems  Musculoskeletal: Positive for arthralgias.  All other systems reviewed and are negative.   Filed Vitals:   06/02/15 1900  BP: 138/66  Pulse: 85  Temp: 98.2 F (36.8 C)  Weight: 138 lb (62.596 kg)  SpO2: 97%   Body mass index is 25.23 kg/(m^2).  Physical Exam  Constitutional: She is oriented to person, place, and time. She appears well-developed.  Sitting in w/c, frail appearing in NAD  HENT:  Mouth/Throat: Oropharynx is clear and moist. No oropharyngeal exudate.  Eyes: Pupils are equal, round, and reactive to light. No scleral icterus.  Neck: Neck supple. Carotid bruit is not present. No tracheal deviation present. No thyromegaly present.  Cardiovascular: Normal rate, regular rhythm, normal heart sounds and intact distal pulses.  Exam reveals no gallop and no friction rub.   No murmur heard. Trace LE edema b/l. No calf TTP  Pulmonary/Chest: Effort normal and breath sounds normal. No stridor. No respiratory distress. She has no wheezes. She has no rales.  Abdominal: Soft. Bowel sounds are normal. She exhibits no  distension and no mass. There is no hepatomegaly. There is tenderness (epigastric TTP). There is no rebound and no guarding.  Musculoskeletal: She exhibits edema and tenderness.  Reduced ROM b/l shoulder and neck  Lymphadenopathy:    She has no cervical adenopathy.  Neurological: She is alert and oriented to person, place, and time.  Skin: Skin is warm and dry. No rash noted.  Psychiatric: She has a normal mood and affect. Her behavior is normal. Thought content normal.     Labs reviewed: No visits with results within 3 Month(s) from this visit. Latest known visit with results is:  Orders Only on 11/10/2014  Component Date Value Ref Range Status  . WBC 11/10/2014 10.7* 4.0 - 10.5 K/uL Final  . RBC 11/10/2014 3.30* 3.87 - 5.11 MIL/uL Final  . Hemoglobin 11/10/2014 9.2* 12.0 - 15.0 g/dL Final  . HCT 11/10/2014 29.6* 36.0 - 46.0 % Final  . MCV 11/10/2014 89.7  78.0 - 100.0 fL Final  . MCH 11/10/2014 27.9  26.0 - 34.0 pg Final  . MCHC 11/10/2014 31.1  30.0 - 36.0 g/dL Final  . RDW 11/10/2014 17.1* 11.5 - 15.5 % Final  . Platelets 11/10/2014 386  150 - 400 K/uL Final  . MPV 11/10/2014 9.0  8.6 - 12.4 fL Final    No results found.   Assessment/Plan   ICD-9-CM ICD-10-CM   1. Chronic pain syndrome 338.4 G89.4   2. Pain in joint involving multiple sites 719.49 M25.50   3. Peripheral neuropathy 356.9 G62.9   4. Constipation, unspecified constipation type 564.00 K59.00   5. Essential hypertension 401.9 I10   6. Depression 311 F32.9   7. History of stroke V12.54 Z86.73     --start robaxin 500mg  BID for muscle spasms  --PT eval and tx joint pain (shoulder b/l, knee b/l and neck)  --start ranitidine 150mg  daily  --  T/c reducing linzes to every other day due to frequent loose stools/watery diarrhea  --cont other meds as ordered  --cont nutritional supplement as ordered  --will follow  Fabian Coca S. Perlie Gold  [facility] [address] [place], [place] [address] [phone] Cell (Monday-Friday 8 AM - 5 PM) [phone] After 5 PM and follow prompts

## 2015-07-10 ENCOUNTER — Non-Acute Institutional Stay (SKILLED_NURSING_FACILITY): Payer: Medicare Other | Admitting: Adult Health

## 2015-07-10 DIAGNOSIS — F329 Major depressive disorder, single episode, unspecified: Secondary | ICD-10-CM

## 2015-07-10 DIAGNOSIS — I10 Essential (primary) hypertension: Secondary | ICD-10-CM | POA: Diagnosis not present

## 2015-07-10 DIAGNOSIS — I639 Cerebral infarction, unspecified: Secondary | ICD-10-CM | POA: Diagnosis not present

## 2015-07-10 DIAGNOSIS — F32A Depression, unspecified: Secondary | ICD-10-CM

## 2015-07-10 DIAGNOSIS — K219 Gastro-esophageal reflux disease without esophagitis: Secondary | ICD-10-CM | POA: Diagnosis not present

## 2015-07-10 DIAGNOSIS — G894 Chronic pain syndrome: Secondary | ICD-10-CM

## 2015-07-10 DIAGNOSIS — D649 Anemia, unspecified: Secondary | ICD-10-CM

## 2015-07-11 ENCOUNTER — Encounter: Payer: Self-pay | Admitting: Adult Health

## 2015-07-11 NOTE — Progress Notes (Signed)
Patient ID: [name], female   DOB: 25-Jun-1925, 81 y.o.   MRN: [id number]    Facility: [facility]      Allergies  Allergen Reactions  . Codeine     Hallucinations  . Ibuprofen     Confusion    Chief Complaint  Patient presents with  . Medical Management of Chronic Issues    HPI:  She is a long term resident of this facility being seen for the management of her chronic illnesses. Overall her status is stable. She is complaining of pain "all over". I am not certain at this time if the duragesic is effective in managing her pain. She seems to associate pills with pain relief. She is not giving off any nonverbal cues of pain at this time.     Past Medical History  Diagnosis Date  . Hypertension   . Head ache   . Renal insufficiency, mild   . Depression   . Fecal occult blood test positive   . Restless leg syndrome   . Anemia   . Cataracts, bilateral   . Hemorrhoids   . Rupture of rotator cuff of shoulder     s/p repair by Dr. Berenice Primas 7/07. Follows with Dr. Tamera Punt for possible shoulder surgery.   . Lumbar spinal stenosis     gets Spinal injections by Dr. Marlaine Hind   . Postnasal drip   . Atrial tachycardia ([place])     echo 04/08/11: EF 55-60%, mild LVH, grade 1 diast dysfxn;    s/p RFCA with Dr. Lovena Le  04/09/11  . Hyperkalemia 04/08/2011  . Unspecified deficiency anemia   . Osteoporosis, unspecified 04/09/2013    DEXA 5/07 : T L forearm -2.9. L femur -0.6, R femur -1.4. [name]  . CVA (cerebral infarction) 04/09/2013    MRI 2005 : Multiple tiny old lacunar infarcts as well as scattered white matter small vessel ischemic disease. Pt asymptomatic and denies TIA / CVA hx.    . Chronic pain syndrome 04/09/2013    On chronic opioids. Cervical MRI 11/ 2012 : Diffuse cervical spondylosis, Diffuse degenerative disc disease with multilevel foraminal stenosis. Possible T1 nerve root involvement. Lumbar MRI 2009 : prior fusion at L3-4 and degenerative disc  disease Rotator cuff repair : Dr. Berenice Primas 7/07. Follows with Dr. Tamera Punt for possible shoulder surgery.  H/O lumbar spinal stenosis with steroid injections. Mo  . Arthritis     b/l shoulder R>L  . Rotator cuff tear arthropathy     03/22/13 dx [facility] (Dr. Tamera Punt) considering surgery  . Stroke ([employer])   . History of blood transfusion 2014    anemia-     Past Surgical History  Procedure Laterality Date  . Doppler echocardiography  2004  . Rotator cuff repair      right  . Back surgery      lumbar x 2, 2000 and 2004 by Dr. Deri Fuelling.   . Eye surgery Bilateral     cataract  . Abdominal hysterectomy    . Orif finger fracture Left     ring finger  . Lumbar laminectomy/decompression microdiscectomy Left 08/06/2013    Procedure: Left Thoracic eleven-twelve microdiskectomy ;  Surgeon: Charlie Pitter, MD;  Location: [place] [facility];  Service: Neurosurgery;  Laterality: Left;  Left Thoracic eleven-twelve microdiskectomy     VITAL SIGNS BP 140/70 mmHg  Pulse 82  Ht 5\' 2"  (1.575 m)  Wt 140 lb (63.504 kg)  BMI 25.60 kg/m2  Patient's Medications  New Prescriptions   No medications on file  Previous Medications   CALCIUM PO    Take 600 mg by mouth 2 (two) times daily.    CARBOXYMETHYLCELLULOSE (REFRESH PLUS) 0.5 % SOLN    Place 1 drop into the right eye 3 (three) times daily.    DICLOFENAC SODIUM (VOLTAREN) 1 % GEL    Apply 4 g topically 4 (four) times daily. Bilateral knees   FENTANYL (DURAGESIC - DOSED MCG/HR) 25 MCG/HR PATCH    Place 2 patches (50 mcg total) onto the skin every 3 (three) days.   FERROUS SULFATE 325 (65 FE) MG TABLET    Take 325 mg by mouth 3 (three) times daily with meals.    FLUTICASONE (FLONASE) 50 MCG/ACT NASAL SPRAY    Place 1 spray into both nostrils daily as needed for allergies or rhinitis (congestion).   GABAPENTIN (NEURONTIN) 600 MG TABLET    Take 600 mg by mouth 3 (three) times daily.   HYDROCODONE-ACETAMINOPHEN (NORCO/VICODIN) 5-325 MG PER TABLET     Take one tablet by mouth every 6 hours as needed for pain. Not to exceed 3000mg  of APAP from all sources/24hr   LINACLOTIDE (LINZESS) 145 MCG CAPS CAPSULE    Take 145 mcg by mouth daily.   LOPERAMIDE (IMODIUM) 2 MG CAPSULE    Take 2 mg by mouth as needed for diarrhea or loose stools.   MENTHOL-METHYL SALICYLATE (BENGAY GREASELESS EX)    Apply 1 application topically 2 (two) times daily as needed. To hands   SERTRALINE (ZOLOFT) 25 MG TABLET    Take 75 mg by mouth daily.   Modified Medications   No medications on file  Discontinued Medications     SIGNIFICANT DIAGNOSTIC EXAMS   10-19-14: chest x-ray: Bilateral patchy interstitial and alveolar airspace opacities most concerning for multilobar pneumonia including atypical infection  10-24-14: -d echo: - Left ventricle: The cavity size was normal. There was mild focal basal hypertrophy of the septum. Systolic function was normal. The estimated ejection fraction was in the range of 55% to 60%. Wall motion was normal; there were no regional wall motion abnormalities. Features are consistent with a pseudonormal left ventricular filling pattern, with concomitant abnormal relaxation and increased filling pressure (grade 2 diastolic dysfunction). - Aortic valve: There was mild stenosis.  - Mitral valve: There was moderate regurgitation. - Left atrium: The atrium was moderately dilated. - Atrial septum: There was a possible patent foramen ovale. - Tricuspid valve: There was moderate regurgitation. - Pulmonary arteries: Systolic pressure was severely increased. PA peak pressure: 86 mm Hg (S).  10-27-14: Chest x-ray: Improvement in aeration without pulmonary edema or segmental infiltrate. Mild hyperinflation. Degenerative changes thoracolumbar spine and bilateral shoulders.  10-29-14: right ankle x-ray: 1. No acute bony findings. 2. Bony demineralization. 3. Degenerative findings along the posterior subtalar facet. 4. Vascular calcifications.  10-29-14:  left ankle x-ray: 1. No fracture or acute bony findings identified. 2. Sensitivity is significantly reduced due to the severity of bony demineralization. If there is a high index of suspicion of occult bony injury, MRI of the ankle would be suggested  11-05-14: chest x-ray: Mild central pulmonary vascular congestion. New mild right basilar opacity is noted concerning for subsegmental atelectasis or possibly pneumonia. Followup radiographs are recommended.     LABS REVIEWED:   04-19-14: wbc 7.8; hgb 7.5; hct 23.2; mcv 91.3; plt 252; glucose 98; bun 27; creat 1.11; k+3.7; na++142; liver normal albumin 3.0; EPO 51.7  05-26-14: tsh 0.662; vit  b12: 568; folate >20 06-18-14: glucose 81; bun 21; creat 1.2; k+3.6; na++143  06-25-14: sed rate 71 10-19-14: wbc 10.8; hgb 9.7; hct 32.5; mcv 96.2; plt 214; glucose 94; bun 13; creat 0.78; k+4.3;  na++ 137; liver normal albumin 3.2;  10-28-14: wbc 18.0; hgb 8.2; hct 20.2; mcv 88.5; plt 330; glucose 95; bun 20; creat 0.81; k+3.9; na++137 11-03-14: wbc 10.9; hgb 5.4; hct 17.4; mcv 89.2; plt 413 11-04-14 wbc 13.9; hgb 8.8; hct 27.1; mcv 87.1; plt 457 11-10-14: wbc 10.7; hgb 9.2; hct 29.6; mcv 89.7; plt 386  12-22-14: glucose 58; bun 12; creat 0.75; k+ 4.5; na++ 142; liver normal albumin 2.2       Review of Systems Unable to perform ROS: Dementia     Physical Exam Constitutional: No distress.  Eyes: Conjunctivae are normal.  Neck: Neck supple. No JVD present. No thyromegaly present.  Cardiovascular: Normal rate, regular rhythm and intact distal pulses.   Respiratory: Effort normal and breath sounds normal. No respiratory distress. She has no wheezes.  GI: Soft. Bowel sounds are normal. She exhibits no distension. There is no tenderness.  Musculoskeletal: She exhibits no edema.  Able to move all extremities   Lymphadenopathy:    She has no cervical adenopathy.  Neurological: She is alert.  Skin: Skin is warm and dry. She is not diaphoretic.  Psychiatric:  She has a normal mood and affect.       ASSESSMENT/ PLAN:  1. Chronic pain syndrome: will continue  neurontin 600 mg three times daily; will continue voltaren gel 4 gm to knees four times daily;  Wean off the duragesic and will increase her vicodin to 7.5/325 mg every 6 hours routinely and twice daily as needed will monitor her status.   2. Anemia: will continue iron three times daily and will monitor hgb is 9.2  3.  Constipation: will continue linzess 145 mcg daily   4. Gerd: will continue protonix 40 mg daily   5. Depression: she is stable and does receive benefit from zoloft 75 mg daily and will monitor   6. Peripheral neuropathy: is stable will continue neurontin 600 mg three times daily   7. CVA: is neurologically stable is presently not on medications; will not make changes will monitor      Ok Edwards NP [facility]  Contact [phone] Monday through Friday 8am- 5pm  After hours call [phone]

## 2015-07-13 ENCOUNTER — Other Ambulatory Visit: Payer: Self-pay | Admitting: *Deleted

## 2015-07-13 MED ORDER — HYDROCODONE-ACETAMINOPHEN 10-325 MG PO TABS: ORAL_TABLET | ORAL | Status: DC

## 2015-07-13 NOTE — Telephone Encounter (Signed)
[facility] 

## 2015-08-09 ENCOUNTER — Encounter: Payer: Self-pay | Admitting: Adult Health

## 2015-08-09 MED ORDER — AMLODIPINE BESYLATE 5 MG PO TABS: 5.0000 mg | ORAL_TABLET | Freq: Every day | ORAL | Status: AC

## 2015-08-09 NOTE — Progress Notes (Signed)
Patient ID: [name], female   DOB: 02-26-1925, 81 y.o.   MRN: [id number]    Facility: [facility]      Allergies  Allergen Reactions  . Codeine     Hallucinations  . Ibuprofen     Confusion    Chief Complaint  Patient presents with  . Acute Visit    hypertension     HPI:  Her blood pressure is not well controlled; with her readings elevated.  She cannot fully participate in the hpi or ros; but states that she is feeling good.     Past Medical History  Diagnosis Date  . Hypertension   . Head ache   . Renal insufficiency, mild   . Depression   . Fecal occult blood test positive   . Restless leg syndrome   . Anemia   . Cataracts, bilateral   . Hemorrhoids   . Rupture of rotator cuff of shoulder     s/p repair by Dr. Berenice Primas 7/07. Follows with Dr. Tamera Punt for possible shoulder surgery.   . Lumbar spinal stenosis     gets Spinal injections by Dr. Marlaine Hind   . Postnasal drip   . Atrial tachycardia ([place])     echo 04/08/11: EF 55-60%, mild LVH, grade 1 diast dysfxn;    s/p RFCA with Dr. Lovena Le  04/09/11  . Hyperkalemia 04/08/2011  . Unspecified deficiency anemia   . Osteoporosis, unspecified 04/09/2013    DEXA 5/07 : T L forearm -2.9. L femur -0.6, R femur -1.4. [name]  . CVA (cerebral infarction) 04/09/2013    MRI 2005 : Multiple tiny old lacunar infarcts as well as scattered white matter small vessel ischemic disease. Pt asymptomatic and denies TIA / CVA hx.    . Chronic pain syndrome 04/09/2013    On chronic opioids. Cervical MRI 11/ 2012 : Diffuse cervical spondylosis, Diffuse degenerative disc disease with multilevel foraminal stenosis. Possible T1 nerve root involvement. Lumbar MRI 2009 : prior fusion at L3-4 and degenerative disc disease Rotator cuff repair : Dr. Berenice Primas 7/07. Follows with Dr. Tamera Punt for possible shoulder surgery.  H/O lumbar spinal stenosis with steroid injections. Mo  . Arthritis     b/l shoulder R>L  . Rotator  cuff tear arthropathy     03/22/13 dx [facility] (Dr. Tamera Punt) considering surgery  . Stroke ([place])   . History of blood transfusion 2014    anemia-     Past Surgical History  Procedure Laterality Date  . Doppler echocardiography  2004  . Rotator cuff repair      right  . Back surgery      lumbar x 2, 2000 and 2004 by Dr. Deri Fuelling.   . Eye surgery Bilateral     cataract  . Abdominal hysterectomy    . Orif finger fracture Left     ring finger  . Lumbar laminectomy/decompression microdiscectomy Left 08/06/2013    Procedure: Left Thoracic eleven-twelve microdiskectomy ;  Surgeon: Charlie Pitter, MD;  Location: [place] [facility];  Service: Neurosurgery;  Laterality: Left;  Left Thoracic eleven-twelve microdiskectomy     VITAL SIGNS BP 186/92 mmHg  Pulse 72  Ht 5\' 2"  (1.575 m)  Wt 140 lb (63.504 kg)  BMI 25.60 kg/m2  Patient's Medications  New Prescriptions   No medications on file  Previous Medications   CALCIUM PO    Take 600 mg by mouth 2 (two) times daily.    CARBOXYMETHYLCELLULOSE (REFRESH PLUS)  0.5 % SOLN    Place 1 drop into the right eye 3 (three) times daily.    DICLOFENAC SODIUM (VOLTAREN) 1 % GEL    Apply 4 g topically 4 (four) times daily. Bilateral knees   FERROUS SULFATE 325 (65 FE) MG TABLET    Take 325 mg by mouth 3 (three) times daily with meals.    FLUTICASONE (FLONASE) 50 MCG/ACT NASAL SPRAY    Place 1 spray into both nostrils daily as needed for allergies or rhinitis (congestion).   GABAPENTIN (NEURONTIN) 600 MG TABLET    Take 600 mg by mouth 3 (three) times daily.   HYDROCODONE-ACETAMINOPHEN (NORCO) 10-325 MG TABLET    Take one tablet by mouth every 4 hours for pain. Not to exceed 3000mg  of APAP form all sources/24h   HYDROCODONE-ACETAMINOPHEN (NORCO) 7.5-325 MG PER TABLET    Take one tablet by mouth every 6 hours routinely and take one tablet by mouth twice daily as needed for pain. Not to exceed 3000mg  of APAP from all sources/24hours    HYDROCODONE-ACETAMINOPHEN (NORCO/VICODIN) 5-325 MG PER TABLET    Take one tablet by mouth every 6 hours as needed for pain. Not to exceed 3000mg  of APAP from all sources/24hr   LINACLOTIDE (LINZESS) 145 MCG CAPS CAPSULE    Take 145 mcg by mouth daily.   LOPERAMIDE (IMODIUM) 2 MG CAPSULE    Take 2 mg by mouth as needed for diarrhea or loose stools.   MENTHOL-METHYL SALICYLATE (BENGAY GREASELESS EX)    Apply 1 application topically 2 (two) times daily as needed. To hands   SERTRALINE (ZOLOFT) 25 MG TABLET    Take 75 mg by mouth daily.   Modified Medications   No medications on file  Discontinued Medications   No medications on file     SIGNIFICANT DIAGNOSTIC EXAMS   10-19-14: chest x-ray: Bilateral patchy interstitial and alveolar airspace opacities most concerning for multilobar pneumonia including atypical infection  10-24-14: -d echo: - Left ventricle: The cavity size was normal. There was mild focal basal hypertrophy of the septum. Systolic function was normal. The estimated ejection fraction was in the range of 55% to 60%. Wall motion was normal; there were no regional wall motion abnormalities. Features are consistent with a pseudonormal left ventricular filling pattern, with concomitant abnormal relaxation and increased filling pressure (grade 2 diastolic dysfunction). - Aortic valve: There was mild stenosis.  - Mitral valve: There was moderate regurgitation. - Left atrium: The atrium was moderately dilated. - Atrial septum: There was a possible patent foramen ovale. - Tricuspid valve: There was moderate regurgitation. - Pulmonary arteries: Systolic pressure was severely increased. PA peak pressure: 86 mm Hg (S).  10-27-14: Chest x-ray: Improvement in aeration without pulmonary edema or segmental infiltrate. Mild hyperinflation. Degenerative changes thoracolumbar spine and bilateral shoulders.  10-29-14: right ankle x-ray: 1. No acute bony findings. 2. Bony demineralization. 3.  Degenerative findings along the posterior subtalar facet. 4. Vascular calcifications.  10-29-14: left ankle x-ray: 1. No fracture or acute bony findings identified. 2. Sensitivity is significantly reduced due to the severity of bony demineralization. If there is a high index of suspicion of occult bony injury, MRI of the ankle would be suggested  11-05-14: chest x-ray: Mild central pulmonary vascular congestion. New mild right basilar opacity is noted concerning for subsegmental atelectasis or possibly pneumonia. Followup radiographs are recommended.     LABS REVIEWED:   05-26-14: tsh 0.662; vit b12: 568; folate >20 06-18-14: glucose 81; bun 21; creat 1.2; k+3.6;  na++143  06-25-14: sed rate 71 10-19-14: wbc 10.8; hgb 9.7; hct 32.5; mcv 96.2; plt 214; glucose 94; bun 13; creat 0.78; k+4.3;  na++ 137; liver normal albumin 3.2;  10-28-14: wbc 18.0; hgb 8.2; hct 20.2; mcv 88.5; plt 330; glucose 95; bun 20; creat 0.81; k+3.9; na++137 11-03-14: wbc 10.9; hgb 5.4; hct 17.4; mcv 89.2; plt 413 11-04-14 wbc 13.9; hgb 8.8; hct 27.1; mcv 87.1; plt 457 11-10-14: wbc 10.7; hgb 9.2; hct 29.6; mcv 89.7; plt 386  12-22-14: glucose 58; bun 12; creat 0.75; k+ 4.5; na++ 142; liver normal albumin 2.2 04-20-15: glucose 73; bun 18; creat 0.86; k+ 3.5; na++142; liver normal albumin 2.6; tsh 3.061        Review of Systems Unable to perform ROS: Dementia     Physical Exam Constitutional: No distress.  Eyes: Conjunctivae are normal.  Neck: Neck supple. No JVD present. No thyromegaly present.  Cardiovascular: Normal rate, regular rhythm and intact distal pulses.   Respiratory: Effort normal and breath sounds normal. No respiratory distress. She has no wheezes.  GI: Soft. Bowel sounds are normal. She exhibits no distension. There is no tenderness.  Musculoskeletal: She exhibits no edema.  Able to move all extremities   Lymphadenopathy:    She has no cervical adenopathy.  Neurological: She is alert.  Skin: Skin is  warm and dry. She is not diaphoretic.  Psychiatric: She has a normal mood and affect.       ASSESSMENT/ PLAN:  1. Hypertension: will begin norvasc 5 mg daily and will have nursing check blood pressure twice daily     Ok Edwards NP [facility]  Contact [phone] Monday through Friday 8am- 5pm  After hours call [phone]

## 2015-08-18 ENCOUNTER — Non-Acute Institutional Stay (SKILLED_NURSING_FACILITY): Payer: Medicare Other | Admitting: Internal Medicine

## 2015-08-18 DIAGNOSIS — D509 Iron deficiency anemia, unspecified: Secondary | ICD-10-CM | POA: Diagnosis not present

## 2015-08-18 DIAGNOSIS — F329 Major depressive disorder, single episode, unspecified: Secondary | ICD-10-CM | POA: Diagnosis not present

## 2015-08-18 DIAGNOSIS — K219 Gastro-esophageal reflux disease without esophagitis: Secondary | ICD-10-CM

## 2015-08-18 DIAGNOSIS — Z8673 Personal history of transient ischemic attack (TIA), and cerebral infarction without residual deficits: Secondary | ICD-10-CM | POA: Diagnosis not present

## 2015-08-18 DIAGNOSIS — M25512 Pain in left shoulder: Secondary | ICD-10-CM

## 2015-08-18 DIAGNOSIS — I10 Essential (primary) hypertension: Secondary | ICD-10-CM | POA: Diagnosis not present

## 2015-08-18 DIAGNOSIS — M25511 Pain in right shoulder: Secondary | ICD-10-CM

## 2015-08-18 DIAGNOSIS — M2550 Pain in unspecified joint: Secondary | ICD-10-CM | POA: Diagnosis not present

## 2015-08-18 DIAGNOSIS — G894 Chronic pain syndrome: Secondary | ICD-10-CM | POA: Diagnosis not present

## 2015-08-18 DIAGNOSIS — G629 Polyneuropathy, unspecified: Secondary | ICD-10-CM | POA: Diagnosis not present

## 2015-08-18 DIAGNOSIS — F32A Depression, unspecified: Secondary | ICD-10-CM

## 2015-08-19 ENCOUNTER — Encounter: Payer: Self-pay | Admitting: Internal Medicine

## 2015-08-19 NOTE — Progress Notes (Signed)
Patient ID: [name], female   DOB: Nov 07, 1924, 81 y.o.   MRN: [id number]    DATE: 08/18/15  Location:  [facility] Service: [phone])   Extended Emergency Contact Information Primary Emergency Contact: Tercero,Thomas Address: [address]          [place], [place] [address] [place] Phone: [phone] Mobile Phone: [phone] Relation: Son Secondary Emergency Contact: Davis,Saranetti  [place] Phone: [phone] Relation: Other  Advanced Directive information  FULL CODE  Chief Complaint  Patient presents with  . Medical Management of Chronic Issues    HPI:  81 yo female long term resident seen today for f/u. [name] generalized pain but states she is unable to raise L>R shoulder. She does take pain meds as ordered. No nursing issues. No falls.  Chronic pain syndrome - takes duragesic 37.5 mcg patch every 3 days; neurontin 600 mg three times daily; voltaren gel 4 gm to knees four times daily; vicodin 5/325 mg every 6 hours as needed  Anemia Hgb stable on iron. last hgb  9.2  Chronic Constipation controlled on linzess 145 mcg daily   GERD - sx's controlled on protonix   Depression - mood stable on zoloft   Peripheral neuropathy - stable on neurontin 600 mg three times daily   Hx CVA -  Stable on no meds  Past Medical History  Diagnosis Date  . Hypertension   . Head ache   . Renal insufficiency, mild   . Depression   . Fecal occult blood test positive   . Restless leg syndrome   . Anemia   . Cataracts, bilateral   . Hemorrhoids   . Rupture of rotator cuff of shoulder     s/p repair by Dr. Berenice Primas 7/07. Follows with Dr. Tamera Punt for possible shoulder surgery.   . Lumbar spinal stenosis     gets Spinal injections by Dr. Marlaine Hind   . Postnasal drip   . Atrial tachycardia ([place])     echo 04/08/11: EF 55-60%, mild LVH, grade 1 diast dysfxn;    s/p RFCA with Dr. Lovena Le  04/09/11  .  Hyperkalemia 04/08/2011  . Unspecified deficiency anemia   . Osteoporosis, unspecified 04/09/2013    DEXA 5/07 : T L forearm -2.9. L femur -0.6, R femur -1.4. [name]  . CVA (cerebral infarction) 04/09/2013    MRI 2005 : Multiple tiny old lacunar infarcts as well as scattered white matter small vessel ischemic disease. Pt asymptomatic and denies TIA / CVA hx.    . Chronic pain syndrome 04/09/2013    On chronic opioids. Cervical MRI 11/ 2012 : Diffuse cervical spondylosis, Diffuse degenerative disc disease with multilevel foraminal stenosis. Possible T1 nerve root involvement. Lumbar MRI 2009 : prior fusion at L3-4 and degenerative disc disease Rotator cuff repair : Dr. Berenice Primas 7/07. Follows with Dr. Tamera Punt for possible shoulder surgery.  H/O lumbar spinal stenosis with steroid injections. Mo  . Arthritis     b/l shoulder R>L  . Rotator cuff tear arthropathy     03/22/13 dx [facility] (Dr. Tamera Punt) considering surgery  . Stroke ([place])   . History of blood transfusion 2014    anemia-     Past Surgical History  Procedure Laterality Date  . Doppler echocardiography  2004  . Rotator cuff repair      right  . Back surgery      lumbar  x 2, 2000 and 2004 by Dr. Deri Fuelling.   . Eye surgery Bilateral     cataract  . Abdominal hysterectomy    . Orif finger fracture Left     ring finger  . Lumbar laminectomy/decompression microdiscectomy Left 08/06/2013    Procedure: Left Thoracic eleven-twelve microdiskectomy ;  Surgeon: Charlie Pitter, MD;  Location: [facility];  Service: Neurosurgery;  Laterality: Left;  Left Thoracic eleven-twelve microdiskectomy     Patient Care Team: [name], DO as PCP - General (Internal Medicine) Juanita Craver, MD as Attending Physician (Gastroenterology)  Social History   Social History  . Marital Status: Single    Spouse Name: N/A  . Number of Children: 1  . Years of Education: N/A   Occupational History  .     Social History Main  Topics  . Smoking status: Never Smoker   . Smokeless tobacco: Not on file  . Alcohol Use: No  . Drug Use: No  . Sexual Activity: Not on file   Other Topics Concern  . Not on file   Social History Narrative   Originally from [place]. She has been here in the [place] since the 60's.   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        reports that she has never smoked. She does not have any smokeless tobacco history on file. She reports that she does not drink alcohol or use illicit drugs.  Immunization History  Administered Date(s) Administered  . Influenza Split 07/11/2011, 10/20/2011, 07/30/2012  . Influenza Whole 08/16/2007, 06/09/2010  . Influenza,inj,Quad PF,36+ Mos 06/27/2013  . Influenza-Unspecified 07/23/2014, 07/03/2015  . PPD Test 08/12/2013  . Pneumococcal Polysaccharide-23 07/11/2011  . Pneumococcal-Unspecified 07/28/2014  . Td 06/18/2009  . Tdap 06/28/2014    Allergies  Allergen Reactions  . Codeine     Hallucinations  . Ibuprofen     Confusion    Medications: Patient's Medications  New Prescriptions   No medications on file  Previous Medications   AMLODIPINE (NORVASC) 5 MG TABLET    Take 1 tablet (5 mg total) by mouth daily.   CALCIUM PO    Take 600 mg by mouth 2 (two) times daily.    CARBOXYMETHYLCELLULOSE (REFRESH PLUS) 0.5 % SOLN    Place 1 drop into the right eye 3 (three) times daily.    DICLOFENAC SODIUM (VOLTAREN) 1 % GEL    Apply 4 g topically 4 (four) times daily. Bilateral knees   FENTANYL (DURAGESIC - DOSED MCG/HR) 25 MCG/HR PATCH    Place 2 patches (50 mcg total) onto the skin every 3 (three) days.   FERROUS SULFATE 325 (65 FE) MG TABLET    Take 325 mg by mouth 3 (three) times daily with meals.    FLUTICASONE (FLONASE) 50 MCG/ACT NASAL SPRAY    Place 1 spray into both nostrils daily as needed for allergies or rhinitis (congestion).    GABAPENTIN (NEURONTIN) 600 MG TABLET    Take 600 mg by mouth 3 (three) times daily.   HYDROCODONE-ACETAMINOPHEN (NORCO) 10-325 MG TABLET    Take one tablet by mouth every 4 hours for pain. Not to exceed 3000mg  of APAP form all sources/24h   HYDROCODONE-ACETAMINOPHEN (NORCO) 7.5-325 MG PER TABLET    Take one tablet by mouth every 6 hours routinely and take one tablet by mouth twice  daily as needed for pain. Not to exceed 3000mg  of APAP from all sources/24hours   HYDROCODONE-ACETAMINOPHEN (NORCO/VICODIN) 5-325 MG PER TABLET    Take one tablet by mouth every 6 hours as needed for pain. Not to exceed 3000mg  of APAP from all sources/24hr   LINACLOTIDE (LINZESS) 145 MCG CAPS CAPSULE    Take 145 mcg by mouth daily.   LOPERAMIDE (IMODIUM) 2 MG CAPSULE    Take 2 mg by mouth as needed for diarrhea or loose stools.   MENTHOL-METHYL SALICYLATE (BENGAY GREASELESS EX)    Apply 1 application topically 2 (two) times daily as needed. To hands   SERTRALINE (ZOLOFT) 25 MG TABLET    Take 75 mg by mouth daily.   Modified Medications   No medications on file  Discontinued Medications   No medications on file    Review of Systems  Musculoskeletal: Positive for joint swelling and arthralgias.  Psychiatric/Behavioral: Positive for dysphoric mood.  All other systems reviewed and are negative.   Filed Vitals:   08/18/15 0916  BP: 123/60  Pulse: 78  Temp: 98.2 F (36.8 C)  Weight: 152 lb (68.947 kg)  SpO2: 93%   Body mass index is 27.79 kg/(m^2).  Physical Exam  Constitutional: She is oriented to person, place, and time. She appears well-developed and well-nourished.  HENT:  Mouth/Throat: Oropharynx is clear and moist. No oropharyngeal exudate.  Eyes: Pupils are equal, round, and reactive to light. No scleral icterus.  Neck: Neck supple. Carotid bruit is not present. No tracheal deviation present. No thyromegaly present.  Cardiovascular: Normal rate, regular rhythm and intact distal pulses.  Exam reveals no  gallop and no friction rub.   Murmur (2/6 SEM) heard. Trace LE edema b/l. No calf TTP  Pulmonary/Chest: Effort normal and breath sounds normal. No stridor. No respiratory distress. She has no wheezes. She has no rales.  Abdominal: Soft. Bowel sounds are normal. She exhibits no distension and no mass. There is no hepatomegaly. There is no tenderness. There is no rebound and no guarding.  Musculoskeletal: She exhibits edema and tenderness.  Small/large joint swelling with reduced ROM and TTP; increased left knee warmth to touch  Lymphadenopathy:    She has no cervical adenopathy.  Neurological: She is alert and oriented to person, place, and time.  Skin: Skin is warm and dry. No rash noted.  Psychiatric: She has a normal mood and affect. Her behavior is normal. Thought content normal.     Labs reviewed: No visits with results within 3 Month(s) from this visit. Latest known visit with results is:  Orders Only on 11/10/2014  Component Date Value Ref Range Status  . WBC 11/10/2014 10.7* 4.0 - 10.5 K/uL Final  . RBC 11/10/2014 3.30* 3.87 - 5.11 MIL/uL Final  . Hemoglobin 11/10/2014 9.2* 12.0 - 15.0 g/dL Final  . HCT 11/10/2014 29.6* 36.0 - 46.0 % Final  . MCV 11/10/2014 89.7  78.0 - 100.0 fL Final  . MCH 11/10/2014 27.9  26.0 - 34.0 pg Final  . MCHC 11/10/2014 31.1  30.0 - 36.0 g/dL Final  . RDW 11/10/2014 17.1* 11.5 - 15.5 % Final  . Platelets 11/10/2014 386  150 - 400 K/uL Final  . MPV 11/10/2014 9.0  8.6 - 12.4 fL Final    No results found.   Assessment/Plan   ICD-9-CM ICD-10-CM   1. Pain of both shoulder joints 719.41 M25.511     M25.512    L>R  2. Pain in joint involving multiple sites 719.49 M25.50  3. Chronic pain syndrome 338.4 G89.4   4. Essential hypertension 401.9 I10   5. Depression 311 F32.9   6. History of stroke V12.54 Z86.73   7. Gastroesophageal reflux disease, esophagitis presence not specified 530.81 K21.9   8. Anemia, iron deficiency 280.9 D50.9   9.  Peripheral polyneuropathy (HCC) 356.9 G62.9     Start voltaren gel 1% apply 2 gm to b/l shoulder TD BID   Check CMP, CBC w diff  Cont other meds as ordered  PT/OT/ST as indicated  Will follow  Ronnisha Felber S. Perlie Gold  [facility] [address] [place], [place] [address] [phone] Cell (Monday-Friday 8 AM - 5 PM) [phone] After 5 PM and follow prompts

## 2015-08-19 NOTE — Progress Notes (Signed)
This encounter was created in error - please disregard.

## 2015-09-03 ENCOUNTER — Encounter: Payer: Self-pay | Admitting: Adult Health

## 2015-09-03 NOTE — Progress Notes (Signed)
Patient ID: [name], female   DOB: [birth year], 81 y.o.   MRN: [id number]   Facility:  [facility]       Allergies  Allergen Reactions  . Codeine     Hallucinations  . Ibuprofen     Confusion    Chief Complaint  Patient presents with  . Medical Management of Chronic Issues    HPI:  She is a long term resident of this facility being seen for the management of her chronic illnesses. She is not voicing any complaints or concerns today. She did have a fall; with no injury she failed to use her call light to ask for help. There are no nursing concerns today.    Past Medical History  Diagnosis Date  . Hypertension   . Head ache   . Renal insufficiency, mild   . Depression   . Fecal occult blood test positive   . Restless leg syndrome   . Anemia   . Cataracts, bilateral   . Hemorrhoids   . Rupture of rotator cuff of shoulder     s/p repair by Dr. Berenice Primas 7/07. Follows with Dr. Tamera Punt for possible shoulder surgery.   . Lumbar spinal stenosis     gets Spinal injections by Dr. Marlaine Hind   . Postnasal drip   . Atrial tachycardia ([place])     echo 04/08/11: EF 55-60%, mild LVH, grade 1 diast dysfxn;    s/p RFCA with Dr. Lovena Le  04/09/11  . Hyperkalemia 04/08/2011  . Unspecified deficiency anemia   . Osteoporosis, unspecified 04/09/2013    DEXA 5/07 : T L forearm -2.9. L femur -0.6, R femur -1.4. [name]  . CVA (cerebral infarction) 04/09/2013    MRI 2005 : Multiple tiny old lacunar infarcts as well as scattered white matter small vessel ischemic disease. Pt asymptomatic and denies TIA / CVA hx.    . Chronic pain syndrome 04/09/2013    On chronic opioids. Cervical MRI 11/ 2012 : Diffuse cervical spondylosis, Diffuse degenerative disc disease with multilevel foraminal stenosis. Possible T1 nerve root involvement. Lumbar MRI 2009 : prior fusion at L3-4 and degenerative disc disease Rotator cuff repair : Dr. Berenice Primas 7/07. Follows with Dr. Tamera Punt for possible shoulder surgery.   H/O lumbar spinal stenosis with steroid injections. Mo  . Arthritis     b/l shoulder R>L  . Rotator cuff tear arthropathy     03/22/13 dx [facility] (Dr. Tamera Punt) considering surgery  . Stroke ([place])   . History of blood transfusion 2014    anemia-     Past Surgical History  Procedure Laterality Date  . Doppler echocardiography  2004  . Rotator cuff repair      right  . Back surgery      lumbar x 2, 2000 and 2004 by Dr. Deri Fuelling.   . Eye surgery Bilateral     cataract  . Abdominal hysterectomy    . Orif finger fracture Left     ring finger  . Lumbar laminectomy/decompression microdiscectomy Left 08/06/2013    Procedure: Left Thoracic eleven-twelve microdiskectomy ;  Surgeon: Charlie Pitter, MD;  Location: [facility];  Service: Neurosurgery;  Laterality: Left;  Left Thoracic eleven-twelve microdiskectomy     VITAL SIGNS BP 126/70 mmHg  Pulse 82  Ht 5\' 2"  (1.575 m)  Wt 146 lb (66.225 kg)  BMI 26.70 kg/m2  Patient's Medications  New Prescriptions   No medications on file  Previous Medications  AMLODIPINE (NORVASC) 5 MG TABLET    Take 1 tablet (5 mg total) by mouth daily.   CALCIUM PO    Take 600 mg by mouth 2 (two) times daily.    CARBOXYMETHYLCELLULOSE (REFRESH PLUS) 0.5 % SOLN    Place 1 drop into the right eye 3 (three) times daily.    DICLOFENAC SODIUM (VOLTAREN) 1 % GEL    Apply 4 g topically 4 (four) times daily. Bilateral knees   FERROUS SULFATE 325 (65 FE) MG TABLET    Take 325 mg by mouth 3 (three) times daily with meals.    GABAPENTIN (NEURONTIN) 600 MG TABLET    Take 600 mg by mouth 3 (three) times daily.   HYDROCODONE-ACETAMINOPHEN (NORCO) 7.5-325 MG PER TABLET    Take one tablet by mouth every 6 hours routinely and take one tablet by mouth twice daily as needed for pain. Not to exceed 3000mg  of APAP from all sources/24hours   LINACLOTIDE (LINZESS) 145 MCG CAPS CAPSULE    Take 145 mcg by mouth daily.   LOPERAMIDE (IMODIUM) 2 MG CAPSULE    Take 2 mg  by mouth as needed for diarrhea or loose stools.   MENTHOL-METHYL SALICYLATE (BENGAY GREASELESS EX)    Apply 1 application topically 2 (two) times daily as needed. To hands   METHOCARBAMOL (ROBAXIN) 500 MG TABLET    Take 500 mg by mouth 2 (two) times daily.   RANITIDINE (ZANTAC) 150 MG TABLET    Take 150 mg by mouth at bedtime.   SERTRALINE (ZOLOFT) 25 MG TABLET    Take 75 mg by mouth daily.   Modified Medications   No medications on file  Discontinued Medications     SIGNIFICANT DIAGNOSTIC EXAMS  10-19-14: chest x-ray: Bilateral patchy interstitial and alveolar airspace opacities most concerning for multilobar pneumonia including atypical infection  10-24-14: -d echo: - Left ventricle: The cavity size was normal. There was mild focal basal hypertrophy of the septum. Systolic function was normal. The estimated ejection fraction was in the range of 55% to 60%. Wall motion was normal; there were no regional wall motion abnormalities. Features are consistent with a pseudonormal left ventricular filling pattern, with concomitant abnormal relaxation and increased filling pressure (grade 2 diastolic dysfunction). - Aortic valve: There was mild stenosis.  - Mitral valve: There was moderate regurgitation. - Left atrium: The atrium was moderately dilated. - Atrial septum: There was a possible patent foramen ovale. - Tricuspid valve: There was moderate regurgitation. - Pulmonary arteries: Systolic pressure was severely increased. PA peak pressure: 86 mm Hg (S).  10-27-14: Chest x-ray: Improvement in aeration without pulmonary edema or segmental infiltrate. Mild hyperinflation. Degenerative changes thoracolumbar spine and bilateral shoulders.  10-29-14: right ankle x-ray: 1. No acute bony findings. 2. Bony demineralization. 3. Degenerative findings along the posterior subtalar facet. 4. Vascular calcifications.  10-29-14: left ankle x-ray: 1. No fracture or acute bony findings identified. 2. Sensitivity  is significantly reduced due to the severity of bony demineralization. If there is a high index of suspicion of occult bony injury, MRI of the ankle would be suggested  11-05-14: chest x-ray: Mild central pulmonary vascular congestion. New mild right basilar opacity is noted concerning for subsegmental atelectasis or possibly pneumonia. Followup radiographs are recommended.     LABS REVIEWED:   10-19-14: wbc 10.8; hgb 9.7; hct 32.5; mcv 96.2; plt 214; glucose 94; bun 13; creat 0.78; k+4.3;  na++ 137; liver normal albumin 3.2;  10-28-14: wbc 18.0; hgb 8.2; hct 20.2; mcv  88.5; plt 330; glucose 95; bun 20; creat 0.81; k+3.9; na++137 11-03-14: wbc 10.9; hgb 5.4; hct 17.4; mcv 89.2; plt 413 11-04-14 wbc 13.9; hgb 8.8; hct 27.1; mcv 87.1; plt 457 11-10-14: wbc 10.7; hgb 9.2; hct 29.6; mcv 89.7; plt 386  12-22-14: glucose 58; bun 12; creat 0.75; k+ 4.5; na++ 142; liver normal albumin 2.2 04-20-15: glucose 73; bun 18; creat 0.86; k+ 3.5; na++142; liver normal albumin 2.6; tsh 3.061        Review of Systems Unable to perform ROS: Dementia     Physical Exam Constitutional: No distress.  Eyes: Conjunctivae are normal.  Neck: Neck supple. No JVD present. No thyromegaly present.  Cardiovascular: Normal rate, regular rhythm and intact distal pulses.   Respiratory: Effort normal and breath sounds normal. No respiratory distress. She has no wheezes.  GI: Soft. Bowel sounds are normal. She exhibits no distension. There is no tenderness.  Musculoskeletal: She exhibits no edema.  Able to move all extremities   Lymphadenopathy:    She has no cervical adenopathy.  Neurological: She is alert.  Skin: Skin is warm and dry. She is not diaphoretic.  Psychiatric: She has a normal mood and affect.       ASSESSMENT/ PLAN:  1. Hypertension: will continue  norvasc 5 mg daily   2. Chronic pain syndrome: will continue  neurontin 600 mg three times daily; will continue voltaren gel 4 gm to knees four times  daily;  will continue  vicodin  7.5/325 mg every 6 hours routinely and twice daily as needed will monitor her status.  She has tolerated coming off duragesic   3. Anemia: will continue iron three times daily and will monitor hgb is 9.2  4.  Constipation: will continue linzess 145 mcg daily   5. Gerd: will continue zantac 150 mg daily   6. Depression: she is stable and does receive benefit from zoloft 75 mg daily and will monitor   7. Peripheral neuropathy: is stable will continue neurontin 600 mg three times daily   8. CVA: is neurologically stable is presently not on medications; will not make changes will monitor           Ok Edwards NP [facility]  Contact [phone] Monday through Friday 8am- 5pm  After hours call [phone]

## 2015-09-11 ENCOUNTER — Non-Acute Institutional Stay (SKILLED_NURSING_FACILITY): Payer: Medicare Other | Admitting: Adult Health

## 2015-09-11 DIAGNOSIS — F32A Depression, unspecified: Secondary | ICD-10-CM

## 2015-09-11 DIAGNOSIS — K5901 Slow transit constipation: Secondary | ICD-10-CM

## 2015-09-11 DIAGNOSIS — I639 Cerebral infarction, unspecified: Secondary | ICD-10-CM

## 2015-09-11 DIAGNOSIS — I10 Essential (primary) hypertension: Secondary | ICD-10-CM

## 2015-09-11 DIAGNOSIS — G894 Chronic pain syndrome: Secondary | ICD-10-CM

## 2015-09-11 DIAGNOSIS — F329 Major depressive disorder, single episode, unspecified: Secondary | ICD-10-CM | POA: Diagnosis not present

## 2015-09-11 DIAGNOSIS — K219 Gastro-esophageal reflux disease without esophagitis: Secondary | ICD-10-CM | POA: Diagnosis not present

## 2015-09-11 DIAGNOSIS — G6289 Other specified polyneuropathies: Secondary | ICD-10-CM

## 2015-09-11 DIAGNOSIS — D509 Iron deficiency anemia, unspecified: Secondary | ICD-10-CM | POA: Diagnosis not present

## 2015-09-14 ENCOUNTER — Encounter: Payer: Self-pay | Admitting: Adult Health

## 2015-09-14 NOTE — Progress Notes (Signed)
Patient ID: [name], female   DOB: 12/12/1924, 81 y.o.   MRN: [id number]   Facility: [facility]      Allergies  Allergen Reactions  . Codeine     Hallucinations  . Ibuprofen     Confusion    Chief Complaint  Patient presents with  . Medical Management of Chronic Issues    HPI:  She is a long term resident of this facility being seen for the management of her chronic illnesses. Overall there is little change in her status. She is not voicing any complaints of pain today. There are no nursing concerns today.    Past Medical History  Diagnosis Date  . Hypertension   . Head ache   . Renal insufficiency, mild   . Depression   . Fecal occult blood test positive   . Restless leg syndrome   . Anemia   . Cataracts, bilateral   . Hemorrhoids   . Rupture of rotator cuff of shoulder     s/p repair by Dr. Berenice Primas 7/07. Follows with Dr. Tamera Punt for possible shoulder surgery.   . Lumbar spinal stenosis     gets Spinal injections by Dr. Marlaine Hind   . Postnasal drip   . Atrial tachycardia ([place])     echo 04/08/11: EF 55-60%, mild LVH, grade 1 diast dysfxn;    s/p RFCA with Dr. Lovena Le  04/09/11  . Hyperkalemia 04/08/2011  . Unspecified deficiency anemia   . Osteoporosis, unspecified 04/09/2013    DEXA 5/07 : T L forearm -2.9. L femur -0.6, R femur -1.4. [name]  . CVA (cerebral infarction) 04/09/2013    MRI 2005 : Multiple tiny old lacunar infarcts as well as scattered white matter small vessel ischemic disease. Pt asymptomatic and denies TIA / CVA hx.    . Chronic pain syndrome 04/09/2013    On chronic opioids. Cervical MRI 11/ 2012 : Diffuse cervical spondylosis, Diffuse degenerative disc disease with multilevel foraminal stenosis. Possible T1 nerve root involvement. Lumbar MRI 2009 : prior fusion at L3-4 and degenerative disc disease Rotator cuff repair : Dr. Berenice Primas 7/07. Follows with Dr. Tamera Punt for possible shoulder surgery.  H/O lumbar spinal stenosis with steroid  injections. Mo  . Arthritis     b/l shoulder R>L  . Rotator cuff tear arthropathy     03/22/13 dx [facility] (Dr. Tamera Punt) considering surgery  . Stroke ([place])   . History of blood transfusion 2014    anemia-     Past Surgical History  Procedure Laterality Date  . Doppler echocardiography  2004  . Rotator cuff repair      right  . Back surgery      lumbar x 2, 2000 and 2004 by Dr. Deri Fuelling.   . Eye surgery Bilateral     cataract  . Abdominal hysterectomy    . Orif finger fracture Left     ring finger  . Lumbar laminectomy/decompression microdiscectomy Left 08/06/2013    Procedure: Left Thoracic eleven-twelve microdiskectomy ;  Surgeon: Charlie Pitter, MD;  Location: [facility];  Service: Neurosurgery;  Laterality: Left;  Left Thoracic eleven-twelve microdiskectomy     VITAL SIGNS BP 126/63 mmHg  Pulse 80  Ht 5\' 2"  (1.575 m)  Wt 154 lb 3.2 oz (69.945 kg)  BMI 28.20 kg/m2  Patient's Medications  New Prescriptions   No medications on file  Previous Medications   AMLODIPINE (NORVASC) 5 MG TABLET    Take 1  tablet (5 mg total) by mouth daily.   CALCIUM PO    Take 600 mg by mouth 2 (two) times daily.    CARBOXYMETHYLCELLULOSE (REFRESH PLUS) 0.5 % SOLN    Place 1 drop into the right eye 3 (three) times daily.    DICLOFENAC SODIUM (VOLTAREN) 1 % GEL    Apply 4 g topically 4 (four) times daily. Bilateral knees And 2 gm to right shoulder   FERROUS SULFATE 325 (65 FE) MG TABLET    Take 325 mg by mouth 3 (three) times daily with meals.    GABAPENTIN (NEURONTIN) 600 MG TABLET    Take 600 mg by mouth 3 (three) times daily.   HYDROCODONE-ACETAMINOPHEN (NORCO) 7.5-325 MG PER TABLET    Take one tablet by mouth every 6 hours routinely and take one tablet by mouth twice daily as needed for pain. Not to exceed 3000mg  of APAP from all sources/24hours   LINACLOTIDE (LINZESS) 145 MCG CAPS CAPSULE    Take 145 mcg by mouth daily.   LOPERAMIDE (IMODIUM) 2 MG CAPSULE    Take 2 mg by  mouth as needed for diarrhea or loose stools.   MENTHOL-METHYL SALICYLATE (BENGAY GREASELESS EX)    Apply 1 application topically 2 (two) times daily as needed. To hands   METHOCARBAMOL (ROBAXIN) 500 MG TABLET    Take 500 mg by mouth 2 (two) times daily.   POTASSIUM CHLORIDE SA (K-DUR,KLOR-CON) 20 MEQ TABLET    Take 20 mEq by mouth daily.   RANITIDINE (ZANTAC) 150 MG TABLET    Take 150 mg by mouth at bedtime.   SERTRALINE (ZOLOFT) 25 MG TABLET    Take 75 mg by mouth daily.   Modified Medications   No medications on file  Discontinued Medications     SIGNIFICANT DIAGNOSTIC EXAMS    10-19-14: chest x-ray: Bilateral patchy interstitial and alveolar airspace opacities most concerning for multilobar pneumonia including atypical infection  10-24-14: -d echo: - Left ventricle: The cavity size was normal. There was mild focal basal hypertrophy of the septum. Systolic function was normal. The estimated ejection fraction was in the range of 55% to 60%. Wall motion was normal; there were no regional wall motion abnormalities. Features are consistent with a pseudonormal left ventricular filling pattern, with concomitant abnormal relaxation and increased filling pressure (grade 2 diastolic dysfunction). - Aortic valve: There was mild stenosis.  - Mitral valve: There was moderate regurgitation. - Left atrium: The atrium was moderately dilated. - Atrial septum: There was a possible patent foramen ovale. - Tricuspid valve: There was moderate regurgitation. - Pulmonary arteries: Systolic pressure was severely increased. PA peak pressure: 86 mm Hg (S).  10-27-14: Chest x-ray: Improvement in aeration without pulmonary edema or segmental infiltrate. Mild hyperinflation. Degenerative changes thoracolumbar spine and bilateral shoulders.  10-29-14: right ankle x-ray: 1. No acute bony findings. 2. Bony demineralization. 3. Degenerative findings along the posterior subtalar facet. 4. Vascular  calcifications.  10-29-14: left ankle x-ray: 1. No fracture or acute bony findings identified. 2. Sensitivity is significantly reduced due to the severity of bony demineralization. If there is a high index of suspicion of occult bony injury, MRI of the ankle would be suggested  11-05-14: chest x-ray: Mild central pulmonary vascular congestion. New mild right basilar opacity is noted concerning for subsegmental atelectasis or possibly pneumonia. Followup radiographs are recommended.     LABS REVIEWED:   10-19-14: wbc 10.8; hgb 9.7; hct 32.5; mcv 96.2; plt 214; glucose 94; bun 13; creat 0.78; k+4.3;  na++ 137; liver normal albumin 3.2;  10-28-14: wbc 18.0; hgb 8.2; hct 20.2; mcv 88.5; plt 330; glucose 95; bun 20; creat 0.81; k+3.9; na++137 11-03-14: wbc 10.9; hgb 5.4; hct 17.4; mcv 89.2; plt 413 11-04-14 wbc 13.9; hgb 8.8; hct 27.1; mcv 87.1; plt 457 11-10-14: wbc 10.7; hgb 9.2; hct 29.6; mcv 89.7; plt 386  12-22-14: glucose 58; bun 12; creat 0.75; k+ 4.5; na++ 142; liver normal albumin 2.2 04-20-15: glucose 73; bun 18; creat 0.86; k+ 3.5; na++142; liver normal albumin 2.6; tsh 3.061  08-21-15: wbc 7.6; hgb 11.;3 hct 34.;3 mcv 91.7; plt 206; glucose 87; bun 19; creat 0.88; k+ 3.4; na++141; liver normal albumin 2.9 08-31-15: k+ 3.9       Review of Systems Unable to perform ROS: Dementia     Physical Exam Constitutional: No distress.  Eyes: Conjunctivae are normal.  Neck: Neck supple. No JVD present. No thyromegaly present.  Cardiovascular: Normal rate, regular rhythm and intact distal pulses.   Respiratory: Effort normal and breath sounds normal. No respiratory distress. She has no wheezes.  GI: Soft. Bowel sounds are normal. She exhibits no distension. There is no tenderness.  Musculoskeletal: She exhibits no edema.  Able to move all extremities   Lymphadenopathy:    She has no cervical adenopathy.  Neurological: She is alert.  Skin: Skin is warm and dry. She is not diaphoretic.   Psychiatric: She has a normal mood and affect.       ASSESSMENT/ PLAN:  1. Hypertension: will continue  norvasc 5 mg daily   2. Chronic pain syndrome: will continue  neurontin 600 mg three times daily; will continue voltaren gel 4 gm to knees four times daily and 2 gm to right shoulder;  will continue  vicodin  7.5/325 mg every 6 hours routinely and twice daily as needed will monitor her status.  She is no longer on  duragesic   3. Anemia: will continue iron three times daily and will monitor hgb is 9.2  4.  Constipation: will continue linzess 145 mcg daily   5. Gerd: will continue zantac 150 mg daily   6. Depression: she is stable and does receive benefit from zoloft 75 mg daily and will monitor   7. Peripheral neuropathy: is stable will continue neurontin 600 mg three times daily   8. CVA: is neurologically stable is presently not on medications; will not make changes will monitor         Ok Edwards NP [facility]  Contact [phone] Monday through Friday 8am- 5pm  After hours call [phone]

## 2015-09-28 LAB — LIPID PANEL
Cholesterol: 142 mg/dL (ref 0–200)
HDL: 36 mg/dL (ref 35–70)
LDL Cholesterol: 89 mg/dL
LDl/HDL Ratio: 3.9
Triglycerides: 83 mg/dL (ref 40–160)

## 2015-09-28 LAB — BASIC METABOLIC PANEL
BUN: 12 mg/dL (ref 4–21)
CREATININE: 0.8 mg/dL (ref 0.5–1.1)
Glucose: 104 mg/dL
POTASSIUM: 4.3 mmol/L (ref 3.4–5.3)
SODIUM: 141 mmol/L (ref 137–147)

## 2015-09-28 LAB — TSH: TSH: 1.04 u[IU]/mL (ref 0.41–5.90)

## 2015-09-28 LAB — HEMOGLOBIN A1C: Hemoglobin A1C: 6.3

## 2015-09-28 LAB — CBC AND DIFFERENTIAL
HCT: 30 % — AB (ref 36–46)
Hemoglobin: 10.3 g/dL — AB (ref 12.0–16.0)
Platelets: 184 10*3/uL (ref 150–399)
WBC: 5.6 10^3/mL

## 2015-09-28 LAB — HEPATIC FUNCTION PANEL
ALT: 12 U/L (ref 7–35)
AST: 10 U/L — AB (ref 13–35)
Alkaline Phosphatase: 81 U/L (ref 25–125)
BILIRUBIN, TOTAL: 0.4 mg/dL

## 2015-10-11 IMAGING — CR DG HAND COMPLETE 3+V*L*
3 series · 3 of 3 positions shown · non-contrast
Comparison: None.

CLINICAL DATA: Pain.  Fall.

EXAM:
LEFT HAND - COMPLETE 3+ VIEW

[x hand pa left]
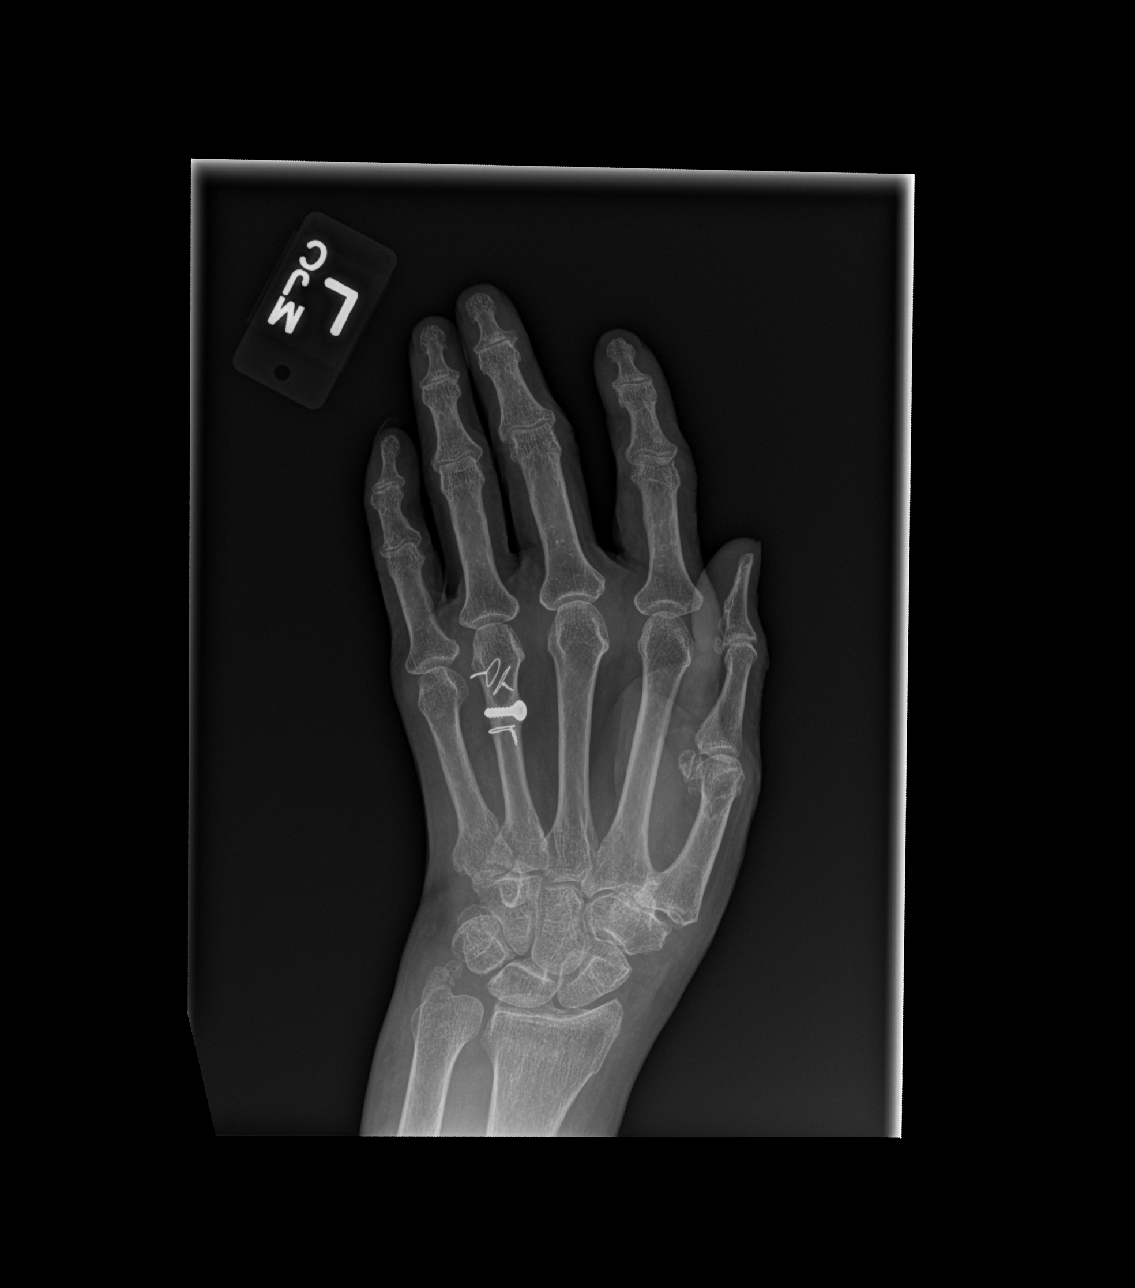

[x hand obl left]
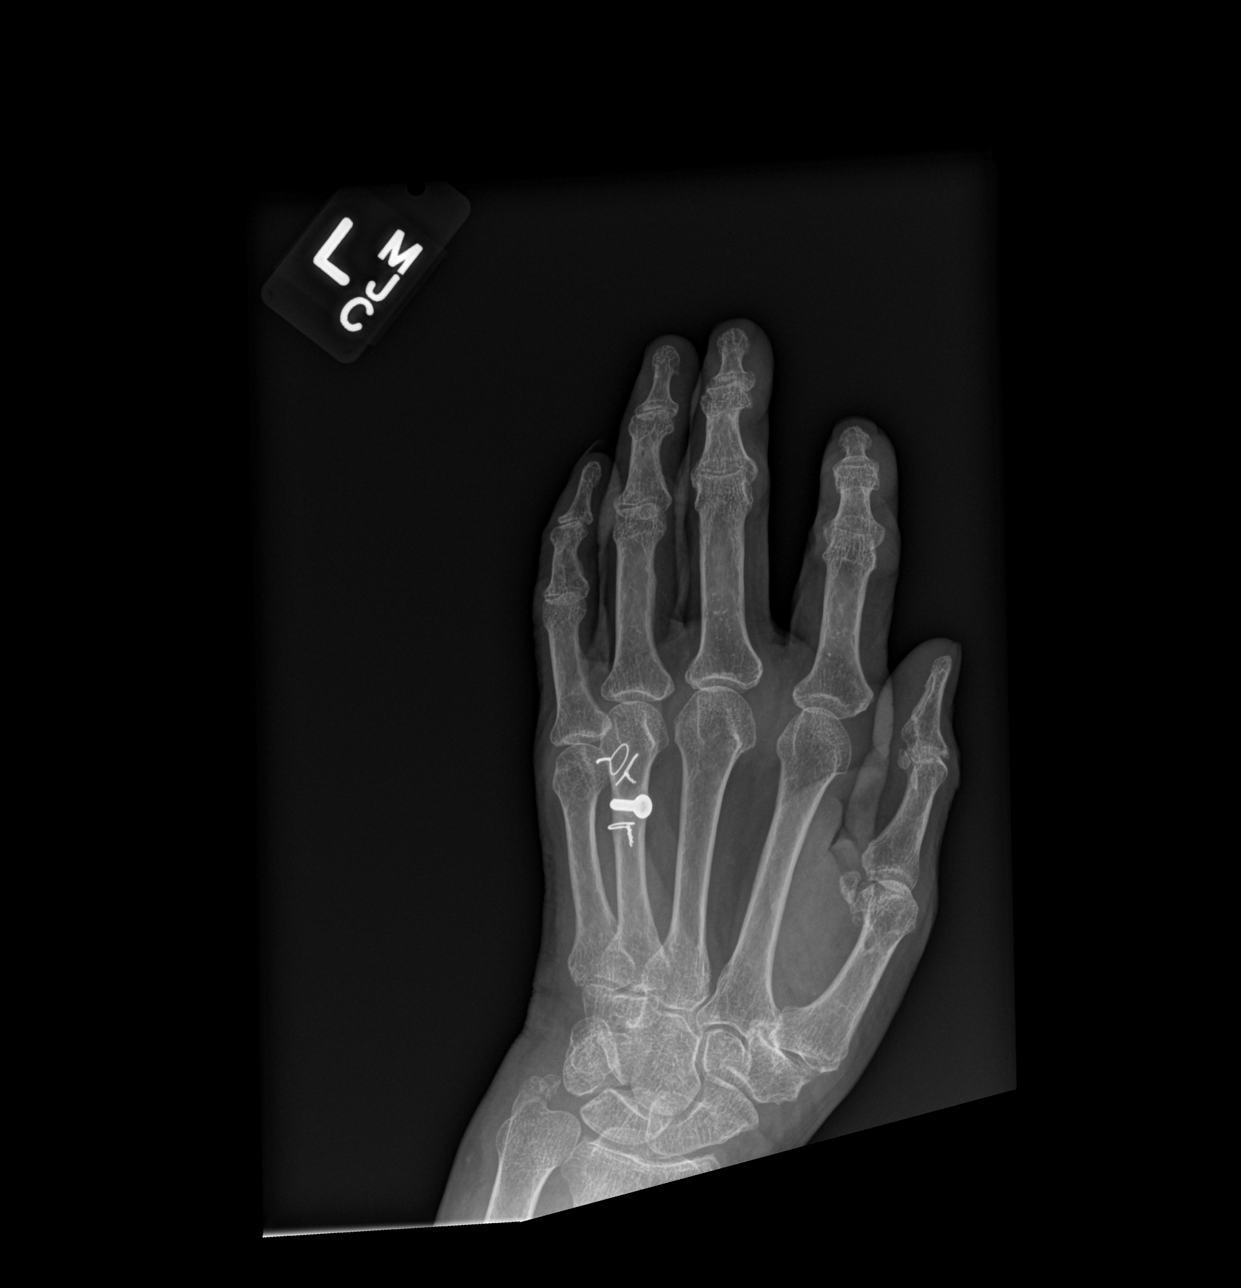

[x hand lat left]
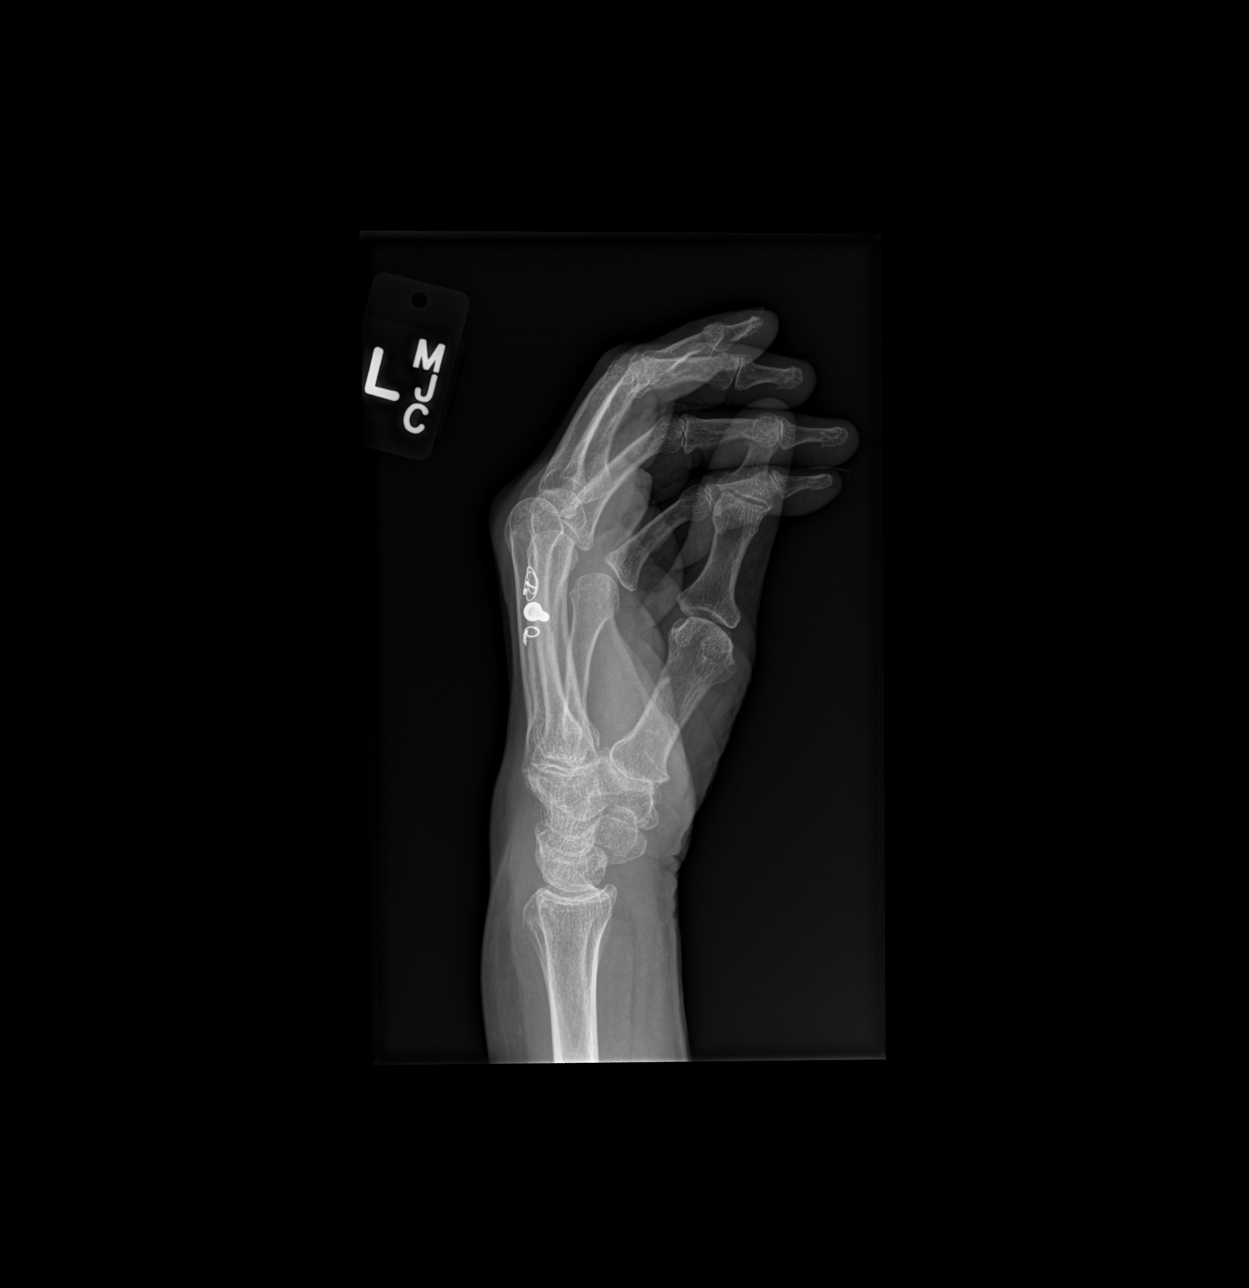

[3 of 3 positions shown; findings below may reference images not displayed]

FINDINGS: No acute fracture. No dislocation. Status post ORIF of a fourth
metacarpal fracture.

Bones are demineralized. There are mild degenerative changes
involving multiple interphalangeal joints.

Ulnar styloid fracture, presumed old lesser is acute tenderness in
this location.

Soft tissues are unremarkable.
IMPRESSION: No acute left hand fracture or dislocation.

Ulnar styloid fracture presumed chronic but possibly acute if this
correlates with pain.

## 2015-10-11 IMAGING — CR DG CHEST 1V
2 series · 2 of 2 positions shown · non-contrast
Comparison: Chest radiograph 08/05/2013

CLINICAL DATA: Status post fall

EXAM:
CHEST - 1 VIEW

[t chest supine (1 of 2)]
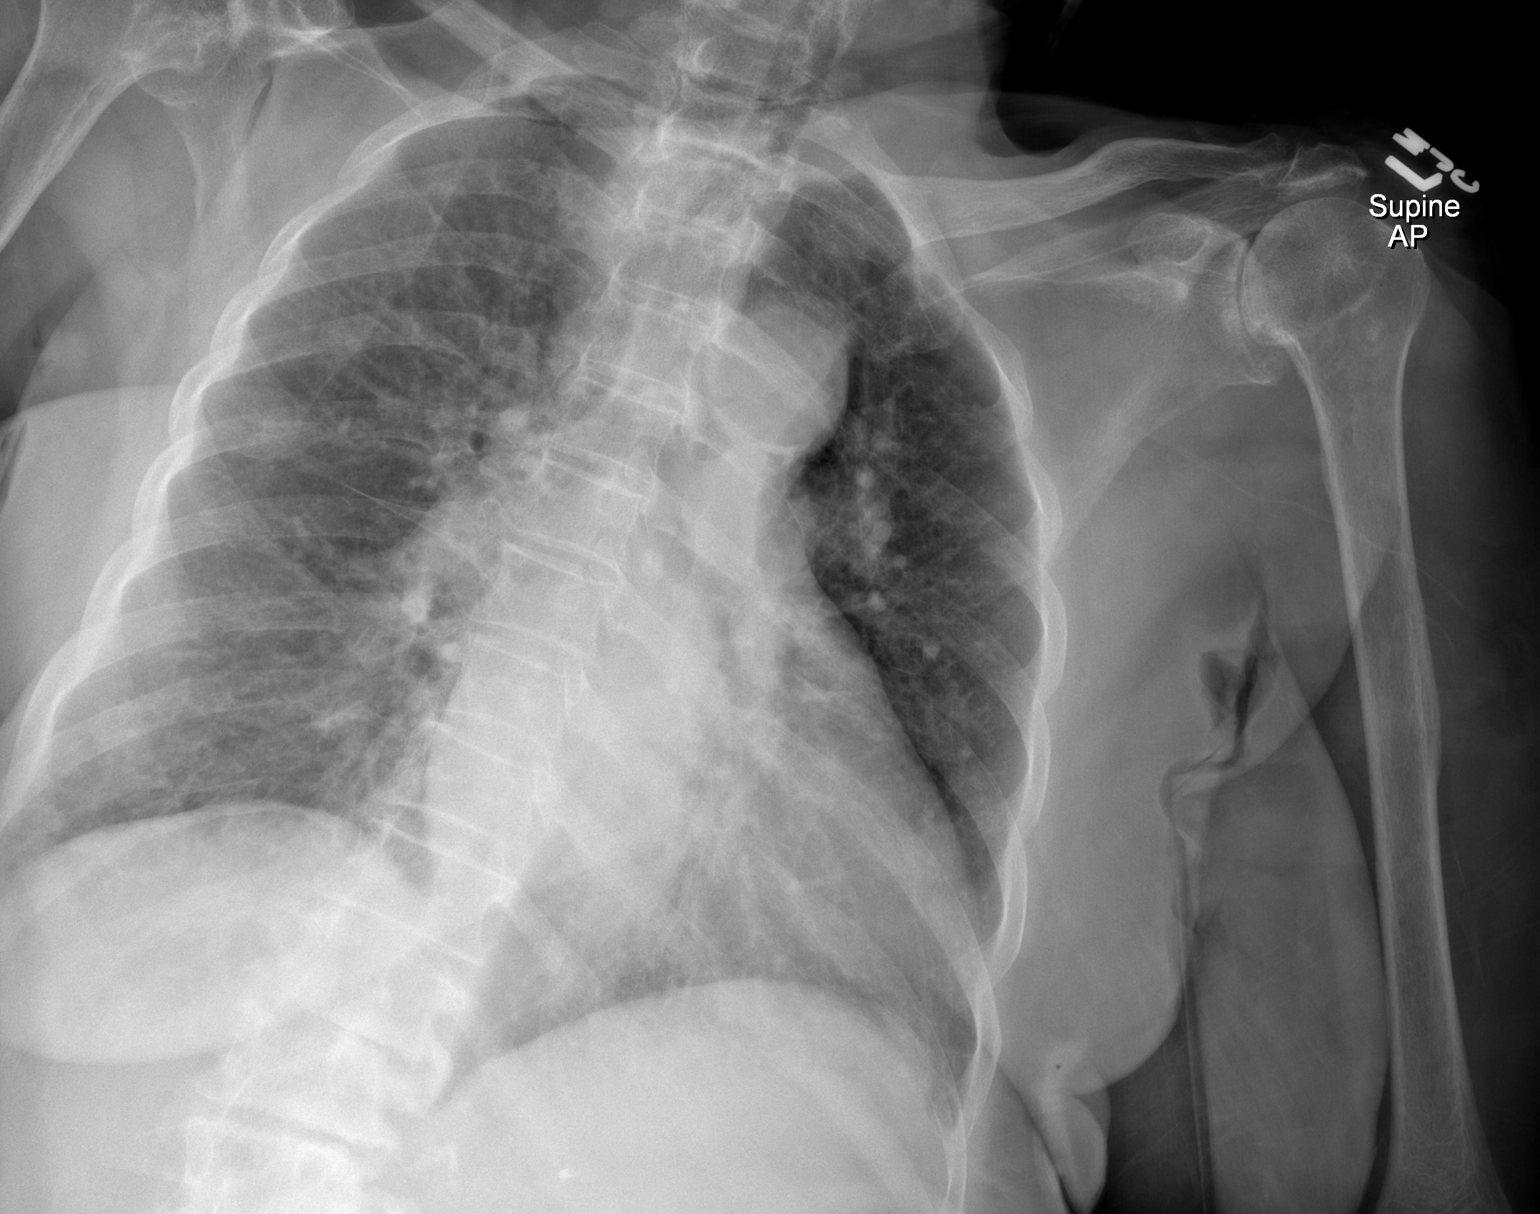

[t chest supine (2 of 2)]
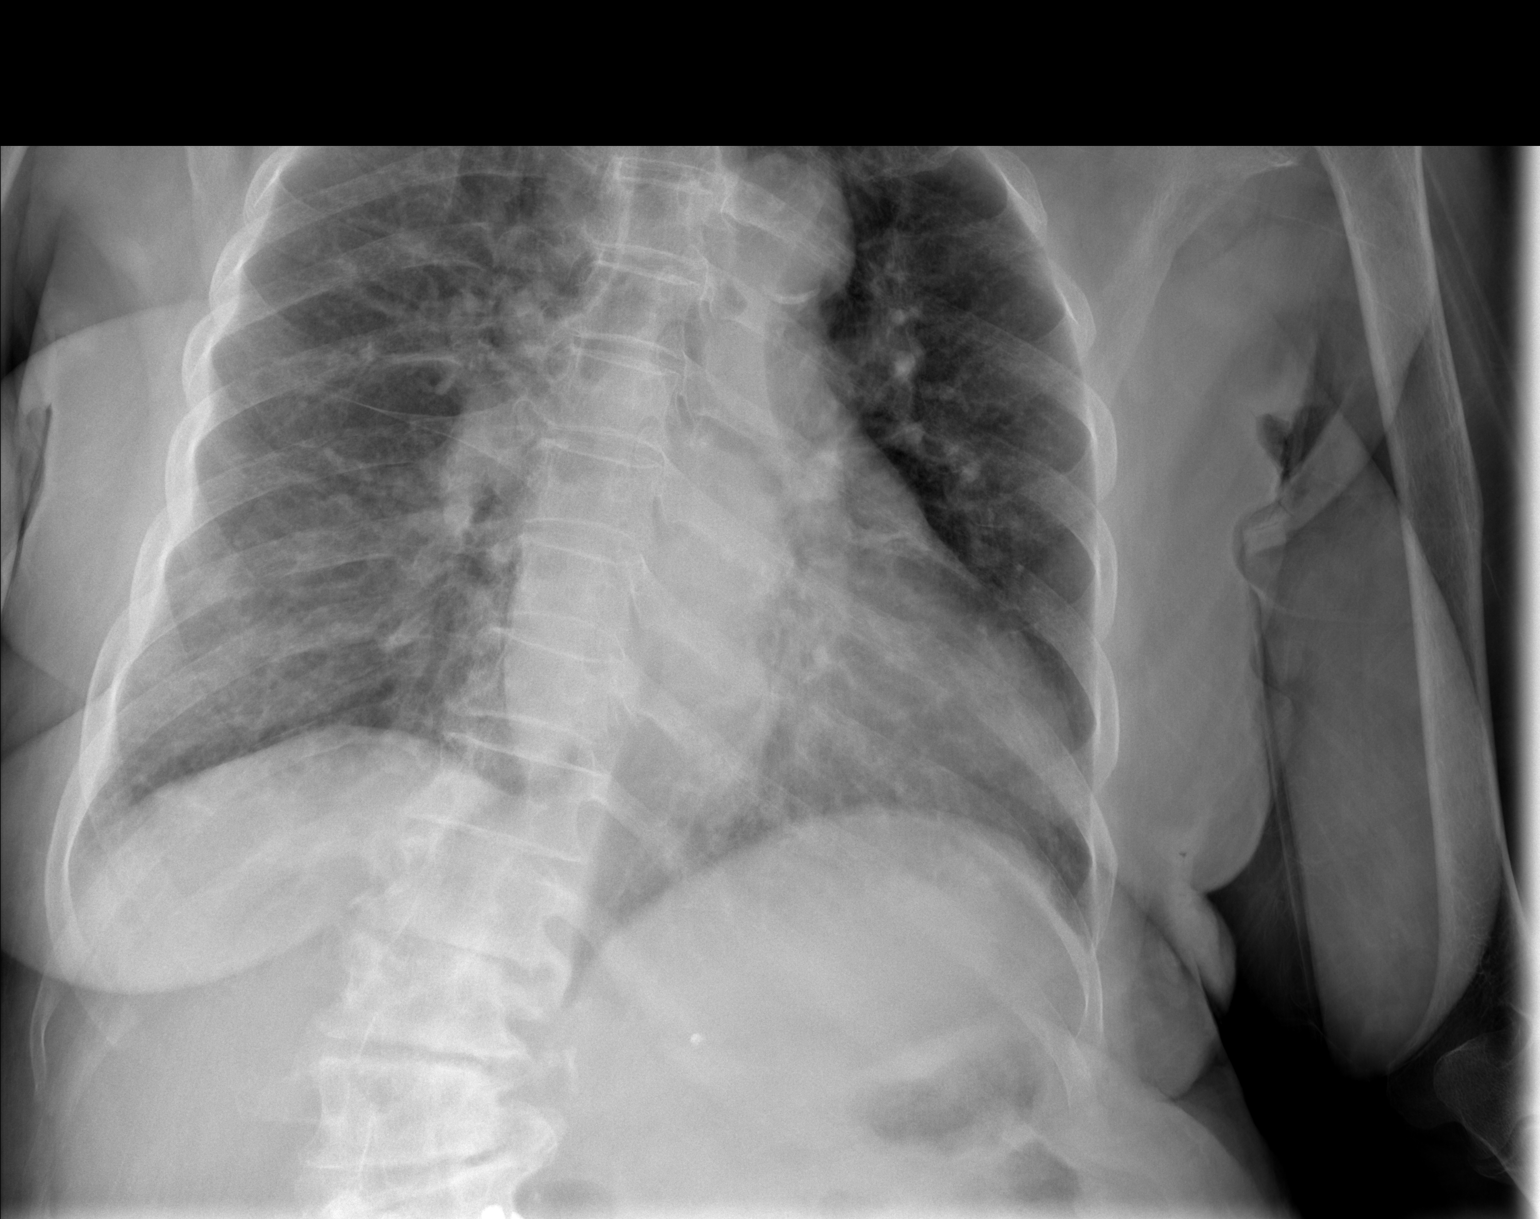

[2 of 2 positions shown; findings below may reference images not displayed]

FINDINGS: Stable cardiac and mediastinal contours. Tortuosity of the thoracic
aorta. Minimal heterogeneous opacities left lung base. No pleural
effusion or pneumothorax. Left glenohumeral joint degenerative
change. Lower lumbar spine degenerative change.
IMPRESSION: Minimal heterogeneous opacities left lung base may represent
atelectasis or infection.

## 2015-11-09 ENCOUNTER — Other Ambulatory Visit: Payer: Self-pay | Admitting: *Deleted

## 2015-11-09 MED ORDER — HYDROCODONE-ACETAMINOPHEN 7.5-325 MG PO TABS
ORAL_TABLET | ORAL | Status: DC
Start: 2015-11-09 — End: 2015-11-20

## 2015-11-09 NOTE — Telephone Encounter (Signed)
[facility]

## 2015-11-20 ENCOUNTER — Non-Acute Institutional Stay (SKILLED_NURSING_FACILITY): Payer: Medicare Other | Admitting: Adult Health

## 2015-11-20 ENCOUNTER — Encounter: Payer: Self-pay | Admitting: Adult Health

## 2015-11-20 DIAGNOSIS — F329 Major depressive disorder, single episode, unspecified: Secondary | ICD-10-CM

## 2015-11-20 DIAGNOSIS — K219 Gastro-esophageal reflux disease without esophagitis: Secondary | ICD-10-CM

## 2015-11-20 DIAGNOSIS — G894 Chronic pain syndrome: Secondary | ICD-10-CM | POA: Diagnosis not present

## 2015-11-20 DIAGNOSIS — G6289 Other specified polyneuropathies: Secondary | ICD-10-CM

## 2015-11-20 DIAGNOSIS — I10 Essential (primary) hypertension: Secondary | ICD-10-CM | POA: Diagnosis not present

## 2015-11-20 DIAGNOSIS — I639 Cerebral infarction, unspecified: Secondary | ICD-10-CM | POA: Diagnosis not present

## 2015-11-20 DIAGNOSIS — G9520 Unspecified cord compression: Secondary | ICD-10-CM

## 2015-11-20 DIAGNOSIS — F32A Depression, unspecified: Secondary | ICD-10-CM

## 2015-11-20 NOTE — Progress Notes (Signed)
Patient ID: [name], female   DOB: 29-Oct-1924, 82 y.o.   MRN: [id number]    Facility: [facility]       Allergies  Allergen Reactions  . Codeine     Hallucinations  . Ibuprofen     Confusion    Chief Complaint  Patient presents with  . Annual Exam      HPI:  She is a long term resident of this facility being seen for the management of her chronic illnesses. She has remained stable over the past several months. She has not been hospitalized. Her weight is stable at 156 pounds. She is not voicing any complaints or concerns telling me that she is feeling good. There are no nursing concerns at this time.    Past Medical History  Diagnosis Date  . Hypertension   . Head ache   . Renal insufficiency, mild   . Depression   . Fecal occult blood test positive   . Restless leg syndrome   . Anemia   . Cataracts, bilateral   . Hemorrhoids   . Rupture of rotator cuff of shoulder     s/p repair by Dr. Berenice Primas 7/07. Follows with Dr. Tamera Punt for possible shoulder surgery.   . Lumbar spinal stenosis     gets Spinal injections by Dr. Marlaine Hind   . Postnasal drip   . Atrial tachycardia ([place])     echo 04/08/11: EF 55-60%, mild LVH, grade 1 diast dysfxn;    s/p RFCA with Dr. Lovena Le  04/09/11  . Hyperkalemia 04/08/2011  . Unspecified deficiency anemia   . Osteoporosis, unspecified 04/09/2013    DEXA 5/07 : T L forearm -2.9. L femur -0.6, R femur -1.4. [name]  . CVA (cerebral infarction) 04/09/2013    MRI 2005 : Multiple tiny old lacunar infarcts as well as scattered white matter small vessel ischemic disease. Pt asymptomatic and denies TIA / CVA hx.    . Chronic pain syndrome 04/09/2013    On chronic opioids. Cervical MRI 11/ 2012 : Diffuse cervical spondylosis, Diffuse degenerative disc disease with multilevel foraminal stenosis. Possible T1 nerve root involvement. Lumbar MRI 2009 : prior fusion at L3-4 and degenerative disc disease Rotator cuff repair : Dr. Berenice Primas 7/07.  Follows with Dr. Tamera Punt for possible shoulder surgery.  H/O lumbar spinal stenosis with steroid injections. Mo  . Arthritis     b/l shoulder R>L  . Rotator cuff tear arthropathy     03/22/13 dx [facility] (Dr. Tamera Punt) considering surgery  . Stroke ([place])   . History of blood transfusion 2014    anemia-     Past Surgical History  Procedure Laterality Date  . Doppler echocardiography  2004  . Rotator cuff repair      right  . Back surgery      lumbar x 2, 2000 and 2004 by Dr. Deri Fuelling.   . Eye surgery Bilateral     cataract  . Abdominal hysterectomy    . Orif finger fracture Left     ring finger  . Lumbar laminectomy/decompression microdiscectomy Left 08/06/2013    Procedure: Left Thoracic eleven-twelve microdiskectomy ;  Surgeon: Charlie Pitter, MD;  Location: [place];  Service: Neurosurgery;  Laterality: Left;  Left Thoracic eleven-twelve microdiskectomy     Family History  Problem Relation Age of Onset  . Coronary artery disease Neg Hx     premature    Social History   Social History  .  Marital Status: Single    Spouse Name: N/A  . Number of Children: 1  . Years of Education: N/A   Occupational History  .     Social History Main Topics  . Smoking status: Never Smoker   . Smokeless tobacco: Not on file  . Alcohol Use: No  . Drug Use: No  . Sexual Activity: Not on file   Other Topics Concern  . Not on file   Social History Narrative   Originally from [place]. She has been here in the [place] since the 60's.   She used to be a hair stylist     VITAL SIGNS BP 151/66 mmHg  Pulse 73  Temp(Src) 97.7 F (36.5 C) (Oral)  Resp 18  Ht 5\' 2"  (1.575 m)  Wt 156 lb 4 oz (70.875 kg)  BMI 28.57 kg/m2  SpO2 96%  Patient's Medications  New Prescriptions   No medications on file  Previous Medications   AMLODIPINE (NORVASC) 5 MG TABLET    Take 1 tablet (5 mg total) by mouth daily.   CALCIUM PO    Take 600 mg by mouth 2 (two) times  daily.    CARBOXYMETHYLCELLULOSE (REFRESH PLUS) 0.5 % SOLN    Place 1 drop into the right eye 3 (three) times daily.    DICLOFENAC SODIUM (VOLTAREN) 1 % GEL    Apply 4 g topically 4 (four) times daily. Bilateral knees And 2 gm to right shoulder   FERROUS SULFATE 325 (65 FE) MG TABLET    Take 325 mg by mouth 3 (three) times daily with meals. Reported on 11/20/2015   GABAPENTIN (NEURONTIN) 600 MG TABLET    Take 600 mg by mouth 3 (three) times daily.   HYDROCODONE-ACETAMINOPHEN (NORCO) 7.5-325 MG TABLET    Take 1 tablet by mouth every 6 (six) hours as needed for moderate pain.   HYDROCODONE-ACETAMINOPHEN (NORCO) 7.5-325 MG TABLET    Take 1 tablet by mouth every 12 (twelve) hours as needed for moderate pain.   LINACLOTIDE (LINZESS) 145 MCG CAPS CAPSULE    Take 145 mcg by mouth daily. Give 30 minutes prior to first meal.   LOPERAMIDE (IMODIUM) 2 MG CAPSULE    Take 2 mg by mouth as needed for diarrhea or loose stools.   MENTHOL-METHYL SALICYLATE (BENGAY GREASELESS EX)    Apply 1 application topically 2 (two) times daily as needed. To hands   METHOCARBAMOL (ROBAXIN) 500 MG TABLET    Take 500 mg by mouth 2 (two) times daily.   POTASSIUM CHLORIDE SA (K-DUR,KLOR-CON) 20 MEQ TABLET    Take 20 mEq by mouth daily.   RANITIDINE (ZANTAC) 150 MG TABLET    Take 150 mg by mouth at bedtime.   SERTRALINE (ZOLOFT) 25 MG TABLET    Take 25 mg by mouth daily. Give with 50 mg tab to equal 75 mg   SERTRALINE (ZOLOFT) 50 MG TABLET    Take 50 mg by mouth daily. Give with 25 mg tab to equal 75 mg total.  Modified Medications   No medications on file  Discontinued Medications     SIGNIFICANT DIAGNOSTIC EXAMS  10-19-14: chest x-ray: Bilateral patchy interstitial and alveolar airspace opacities most concerning for multilobar pneumonia including atypical infection  10-24-14: -d echo: - Left ventricle: The cavity size was normal. There was mild focal basal hypertrophy of the septum. Systolic function was normal. The estimated  ejection fraction was in the range of 55% to 60%. Wall motion was normal; there were no  regional wall motion abnormalities. Features are consistent with a pseudonormal left ventricular filling pattern, with concomitant abnormal relaxation and increased filling pressure (grade 2 diastolic dysfunction). - Aortic valve: There was mild stenosis.  - Mitral valve: There was moderate regurgitation. - Left atrium: The atrium was moderately dilated. - Atrial septum: There was a possible patent foramen ovale. - Tricuspid valve: There was moderate regurgitation. - Pulmonary arteries: Systolic pressure was severely increased. PA peak pressure: 86 mm Hg (S).  10-27-14: Chest x-ray: Improvement in aeration without pulmonary edema or segmental infiltrate. Mild hyperinflation. Degenerative changes thoracolumbar spine and bilateral shoulders.  10-29-14: right ankle x-ray: 1. No acute bony findings. 2. Bony demineralization. 3. Degenerative findings along the posterior subtalar facet. 4. Vascular calcifications.  10-29-14: left ankle x-ray: 1. No fracture or acute bony findings identified. 2. Sensitivity is significantly reduced due to the severity of bony demineralization. If there is a high index of suspicion of occult bony injury, MRI of the ankle would be suggested  11-05-14: chest x-ray: Mild central pulmonary vascular congestion. New mild right basilar opacity is noted concerning for subsegmental atelectasis or possibly pneumonia. Followup radiographs are recommended.     LABS REVIEWED:   10-19-14: wbc 10.8; hgb 9.7; hct 32.5; mcv 96.2; plt 214; glucose 94; bun 13; creat 0.78; k+4.3;  na++ 137; liver normal albumin 3.2;  10-28-14: wbc 18.0; hgb 8.2; hct 20.2; mcv 88.5; plt 330; glucose 95; bun 20; creat 0.81; k+3.9; na++137 11-03-14: wbc 10.9; hgb 5.4; hct 17.4; mcv 89.2; plt 413 11-04-14 wbc 13.9; hgb 8.8; hct 27.1; mcv 87.1; plt 457 11-10-14: wbc 10.7; hgb 9.2; hct 29.6; mcv 89.7; plt 386  12-22-14: glucose  58; bun 12; creat 0.75; k+ 4.5; na++ 142; liver normal albumin 2.2 04-20-15: glucose 73; bun 18; creat 0.86; k+ 3.5; na++142; liver normal albumin 2.6; tsh 3.061  08-21-15: wbc 7.6; hgb 11.;3 hct 34.;3 mcv 91.7; plt 206; glucose 87; bun 19; creat 0.88; k+ 3.4; na++141; liver normal albumin 2.9 08-31-15: k+ 3.9       Review of Systems Unable to perform ROS: Dementia     Physical Exam Constitutional: No distress.  Eyes: Conjunctivae are normal.  Neck: Neck supple. No JVD present. No thyromegaly present.  Cardiovascular: Normal rate, regular rhythm and intact distal pulses.   Respiratory: Effort normal and breath sounds normal. No respiratory distress. She has no wheezes.  GI: Soft. Bowel sounds are normal. She exhibits no distension. There is no tenderness.  Musculoskeletal: She exhibits no edema.  Able to move all extremities   Lymphadenopathy:    She has no cervical adenopathy.  Neurological: She is alert.  Skin: Skin is warm and dry. She is not diaphoretic.  Psychiatric: She has a normal mood and affect.       ASSESSMENT/ PLAN:  1. Hypertension: will continue  norvasc 5 mg daily   2. Chronic pain syndrome:  Has spinal cord compression: will continue  neurontin 600 mg three times daily; will continue voltaren gel 4 gm to knees four times daily and 2 gm to right shoulder;  will continue  vicodin  7.5/325 mg every 6 hours as needed will monitor her status.  She is no longer on  duragesic   3. Anemia: will continue iron  daily and will monitor hgb is 9.2  4.  Constipation: will continue linzess 145 mcg daily   5. Gerd: will continue zantac 150 mg daily   6. Depression: she is stable and does receive benefit from zoloft  75 mg daily and will monitor   7. Peripheral neuropathy: is stable will continue neurontin 600 mg three times daily   8. CVA: is neurologically stable is presently not on medications; will not make changes will monitor     Her health care maintenance is  up to date    Time spent with patient  40   minutes >50% time spent counseling; reviewing medical record; tests; labs; and developing future plan of care       Ok Edwards NP [facility]  Contact [phone] Monday through Friday 8am- 5pm  After hours call [phone]

## 2015-12-04 LAB — HM DIABETES FOOT EXAM

## 2015-12-17 ENCOUNTER — Encounter: Payer: Self-pay | Admitting: Adult Health

## 2015-12-17 ENCOUNTER — Non-Acute Institutional Stay (SKILLED_NURSING_FACILITY): Payer: Medicare Other | Admitting: Adult Health

## 2015-12-17 DIAGNOSIS — I10 Essential (primary) hypertension: Secondary | ICD-10-CM | POA: Diagnosis not present

## 2015-12-17 DIAGNOSIS — K219 Gastro-esophageal reflux disease without esophagitis: Secondary | ICD-10-CM

## 2015-12-17 DIAGNOSIS — I639 Cerebral infarction, unspecified: Secondary | ICD-10-CM

## 2015-12-17 DIAGNOSIS — F32A Depression, unspecified: Secondary | ICD-10-CM

## 2015-12-17 DIAGNOSIS — G894 Chronic pain syndrome: Secondary | ICD-10-CM

## 2015-12-17 DIAGNOSIS — F329 Major depressive disorder, single episode, unspecified: Secondary | ICD-10-CM | POA: Diagnosis not present

## 2015-12-17 DIAGNOSIS — D509 Iron deficiency anemia, unspecified: Secondary | ICD-10-CM

## 2015-12-17 NOTE — Progress Notes (Signed)
Patient ID: [name], female   DOB: 04-10-1925, 82 y.o.   MRN: [id number]   Facility: [facility]       Allergies  Allergen Reactions  . Codeine     Hallucinations  . Ibuprofen     Confusion    Chief Complaint  Patient presents with  . Medical Management of Chronic Issues    Follow up    HPI:  She is a long term resident of this facility being seen for the management of her chronic illnesses. She is unable to fully participate in the hpi or ros; but did tell me that she is having abdominal pain. Staff reports that she is having watery stools. There are no reports of fever present; no reports of change in appetite.     Past Medical History  Diagnosis Date  . Hypertension   . Head ache   . Renal insufficiency, mild   . Depression   . Fecal occult blood test positive   . Restless leg syndrome   . Anemia   . Cataracts, bilateral   . Hemorrhoids   . Rupture of rotator cuff of shoulder     s/p repair by Dr. Berenice Primas 7/07. Follows with Dr. Tamera Punt for possible shoulder surgery.   . Lumbar spinal stenosis     gets Spinal injections by Dr. Marlaine Hind   . Postnasal drip   . Atrial tachycardia ([place])     echo 04/08/11: EF 55-60%, mild LVH, grade 1 diast dysfxn;    s/p RFCA with Dr. Lovena Le  04/09/11  . Hyperkalemia 04/08/2011  . Unspecified deficiency anemia   . Osteoporosis, unspecified 04/09/2013    DEXA 5/07 : T L forearm -2.9. L femur -0.6, R femur -1.4. [name]  . CVA (cerebral infarction) 04/09/2013    MRI 2005 : Multiple tiny old lacunar infarcts as well as scattered white matter small vessel ischemic disease. Pt asymptomatic and denies TIA / CVA hx.    . Chronic pain syndrome 04/09/2013    On chronic opioids. Cervical MRI 11/ 2012 : Diffuse cervical spondylosis, Diffuse degenerative disc disease with multilevel foraminal stenosis. Possible T1 nerve root involvement. Lumbar MRI 2009 : prior fusion at L3-4 and degenerative disc disease Rotator cuff repair : Dr.  Berenice Primas 7/07. Follows with Dr. Tamera Punt for possible shoulder surgery.  H/O lumbar spinal stenosis with steroid injections. Mo  . Arthritis     b/l shoulder R>L  . Rotator cuff tear arthropathy     03/22/13 dx [facility] (Dr. Tamera Punt) considering surgery  . Stroke ([place])   . History of blood transfusion 2014    anemia-     Past Surgical History  Procedure Laterality Date  . Doppler echocardiography  2004  . Rotator cuff repair      right  . Back surgery      lumbar x 2, 2000 and 2004 by Dr. Deri Fuelling.   . Eye surgery Bilateral     cataract  . Abdominal hysterectomy    . Orif finger fracture Left     ring finger  . Lumbar laminectomy/decompression microdiscectomy Left 08/06/2013    Procedure: Left Thoracic eleven-twelve microdiskectomy ;  Surgeon: Charlie Pitter, MD;  Location: [facility];  Service: Neurosurgery;  Laterality: Left;  Left Thoracic eleven-twelve microdiskectomy     VITAL SIGNS BP 136/74 mmHg  Pulse 82  Temp(Src) 98.5 F (36.9 C) (Oral)  Resp 18  Ht 5\' 2"  (1.575 m)  Wt 157  lb (71.215 kg)  BMI 28.71 kg/m2  SpO2 95%  Patient's Medications  New Prescriptions   No medications on file  Previous Medications   AMLODIPINE (NORVASC) 5 MG TABLET    Take 1 tablet (5 mg total) by mouth daily.   CALCIUM PO    Take 600 mg by mouth 2 (two) times daily.    CARBOXYMETHYLCELLULOSE (REFRESH PLUS) 0.5 % SOLN    Place 1 drop into the right eye 3 (three) times daily.    DICLOFENAC SODIUM (VOLTAREN) 1 % GEL    Apply 4 g topically 4 (four) times daily. Bilateral knees And 2 gm to right shoulder   FERROUS SULFATE 325 (65 FE) MG TABLET    Take 325 mg by mouth daily.   GABAPENTIN (NEURONTIN) 600 MG TABLET    Take 600 mg by mouth 3 (three) times daily.   HYDROCODONE-ACETAMINOPHEN (NORCO) 7.5-325 MG TABLET    Take 1 tablet by mouth every 6 (six) hours as needed for moderate pain.   LINACLOTIDE (LINZESS) 145 MCG CAPS CAPSULE    Take 145 mcg by mouth daily. Give 30 minutes  prior to first meal.   LOPERAMIDE (IMODIUM) 2 MG CAPSULE    Take 2 mg by mouth as needed for diarrhea or loose stools.   MENTHOL-METHYL SALICYLATE (BENGAY GREASELESS EX)    Apply 1 application topically 2 (two) times daily as needed. To hands   METHOCARBAMOL (ROBAXIN) 500 MG TABLET    Take 500 mg by mouth 2 (two) times daily.   POTASSIUM CHLORIDE SA (K-DUR,KLOR-CON) 20 MEQ TABLET    Take 20 mEq by mouth daily.   RANITIDINE (ZANTAC) 150 MG TABLET    Take 150 mg by mouth at bedtime.   SERTRALINE (ZOLOFT) 25 MG TABLET    Take 25 mg by mouth daily. Give with 50 mg tab to equal 75 mg   SERTRALINE (ZOLOFT) 50 MG TABLET    Take 50 mg by mouth daily. Give with 25 mg tab to equal 75 mg total.  Modified Medications   No medications on file  Discontinued Medications     SIGNIFICANT DIAGNOSTIC EXAMS  10-19-14: chest x-ray: Bilateral patchy interstitial and alveolar airspace opacities most concerning for multilobar pneumonia including atypical infection  10-24-14: -d echo: - Left ventricle: The cavity size was normal. There was mild focal basal hypertrophy of the septum. Systolic function was normal. The estimated ejection fraction was in the range of 55% to 60%. Wall motion was normal; there were no regional wall motion abnormalities. Features are consistent with a pseudonormal left ventricular filling pattern, with concomitant abnormal relaxation and increased filling pressure (grade 2 diastolic dysfunction). - Aortic valve: There was mild stenosis.  - Mitral valve: There was moderate regurgitation. - Left atrium: The atrium was moderately dilated. - Atrial septum: There was a possible patent foramen ovale. - Tricuspid valve: There was moderate regurgitation. - Pulmonary arteries: Systolic pressure was severely increased. PA peak pressure: 86 mm Hg (S).  10-27-14: Chest x-ray: Improvement in aeration without pulmonary edema or segmental infiltrate. Mild hyperinflation. Degenerative changes thoracolumbar  spine and bilateral shoulders.  10-29-14: right ankle x-ray: 1. No acute bony findings. 2. Bony demineralization. 3. Degenerative findings along the posterior subtalar facet. 4. Vascular calcifications.  10-29-14: left ankle x-ray: 1. No fracture or acute bony findings identified. 2. Sensitivity is significantly reduced due to the severity of bony demineralization. If there is a high index of suspicion of occult bony injury, MRI of the ankle would be suggested  11-05-14: chest x-ray: Mild central pulmonary vascular congestion. New mild right basilar opacity is noted concerning for subsegmental atelectasis or possibly pneumonia. Followup radiographs are recommended.     LABS REVIEWED:   12-22-14: glucose 58; bun 12; creat 0.75; k+ 4.5; na++ 142; liver normal albumin 2.2 04-20-15: glucose 73; bun 18; creat 0.86; k+ 3.5; na++142; liver normal albumin 2.6; tsh 3.061  08-21-15: wbc 7.6; hgb 11.;3 hct 34.;3 mcv 91.7; plt 206; glucose 87; bun 19; creat 0.88; k+ 3.4; na++141; liver normal albumin 2.9 08-31-15: k+ 3.9       Review of Systems Unable to perform ROS: Dementia     Physical Exam Constitutional: No distress.  Eyes: Conjunctivae are normal.  Neck: Neck supple. No JVD present. No thyromegaly present.  Cardiovascular: Normal rate, regular rhythm and intact distal pulses.   Respiratory: Effort normal and breath sounds normal. No respiratory distress. She has no wheezes.  GI: Soft. Bowel sounds are normal. She exhibits no distension. There is no tenderness. guaiac stool neg  Musculoskeletal: She exhibits no edema.  Able to move all extremities   Lymphadenopathy:    She has no cervical adenopathy.  Neurological: She is alert.  Skin: Skin is warm and dry. She is not diaphoretic.  Psychiatric: She has a normal mood and affect.       ASSESSMENT/ PLAN:  1. Hypertension: will continue  norvasc 5 mg daily   2. Chronic pain syndrome:  Has spinal cord compression: will continue   neurontin 600 mg three times daily; will continue voltaren gel 4 gm to knees four times daily and 2 gm to right shoulder;  will continue  vicodin  7.5/325 mg every 6 hours as needed will monitor her status.  She is no longer on  duragesic   3. Anemia: will continue iron  daily and will monitor hgb is 9.2  4.  Constipation: will stop the linzess due to her watery stools and abdominal pain. Will get a kub.    5. Gerd: will continue zantac 150 mg daily   6. Depression: she is stable and does receive benefit from zoloft 75 mg daily and will monitor   7. Peripheral neuropathy: is stable will continue neurontin 600 mg three times daily   8. CVA: is neurologically stable is presently not on medications; will not make changes will monitor         Ok Edwards NP [facility]  Contact [phone] Monday through Friday 8am- 5pm  After hours call [phone]

## 2015-12-18 ENCOUNTER — Inpatient Hospital Stay (HOSPITAL_COMMUNITY)
Admission: EM | Admit: 2015-12-18 | Discharge: 2015-12-21 | DRG: 871 | Disposition: A | Discharge status: Skilled Nursing Facility | Payer: Medicare Other | Attending: Internal Medicine | Admitting: Internal Medicine

## 2015-12-18 ENCOUNTER — Encounter (HOSPITAL_COMMUNITY): Payer: Self-pay | Admitting: Emergency Medicine

## 2015-12-18 ENCOUNTER — Encounter: Payer: Self-pay | Admitting: Adult Health

## 2015-12-18 ENCOUNTER — Other Ambulatory Visit: Payer: Self-pay

## 2015-12-18 ENCOUNTER — Non-Acute Institutional Stay (SKILLED_NURSING_FACILITY): Payer: Medicare Other | Admitting: Adult Health

## 2015-12-18 ENCOUNTER — Emergency Department (HOSPITAL_COMMUNITY): Payer: Medicare Other

## 2015-12-18 DIAGNOSIS — I471 Supraventricular tachycardia: Secondary | ICD-10-CM | POA: Diagnosis present

## 2015-12-18 DIAGNOSIS — K5901 Slow transit constipation: Secondary | ICD-10-CM

## 2015-12-18 DIAGNOSIS — A419 Sepsis, unspecified organism: Secondary | ICD-10-CM | POA: Diagnosis not present

## 2015-12-18 DIAGNOSIS — R0602 Shortness of breath: Secondary | ICD-10-CM

## 2015-12-18 DIAGNOSIS — Z79891 Long term (current) use of opiate analgesic: Secondary | ICD-10-CM

## 2015-12-18 DIAGNOSIS — K219 Gastro-esophageal reflux disease without esophagitis: Secondary | ICD-10-CM | POA: Diagnosis present

## 2015-12-18 DIAGNOSIS — I10 Essential (primary) hypertension: Secondary | ICD-10-CM | POA: Diagnosis present

## 2015-12-18 DIAGNOSIS — J1000 Influenza due to other identified influenza virus with unspecified type of pneumonia: Secondary | ICD-10-CM | POA: Diagnosis present

## 2015-12-18 DIAGNOSIS — D649 Anemia, unspecified: Secondary | ICD-10-CM | POA: Diagnosis present

## 2015-12-18 DIAGNOSIS — J111 Influenza due to unidentified influenza virus with other respiratory manifestations: Secondary | ICD-10-CM | POA: Diagnosis present

## 2015-12-18 DIAGNOSIS — Z981 Arthrodesis status: Secondary | ICD-10-CM

## 2015-12-18 DIAGNOSIS — L8990 Pressure ulcer of unspecified site, unspecified stage: Secondary | ICD-10-CM | POA: Insufficient documentation

## 2015-12-18 DIAGNOSIS — F329 Major depressive disorder, single episode, unspecified: Secondary | ICD-10-CM | POA: Diagnosis present

## 2015-12-18 DIAGNOSIS — J101 Influenza due to other identified influenza virus with other respiratory manifestations: Secondary | ICD-10-CM | POA: Diagnosis present

## 2015-12-18 DIAGNOSIS — R0989 Other specified symptoms and signs involving the circulatory and respiratory systems: Secondary | ICD-10-CM | POA: Diagnosis present

## 2015-12-18 DIAGNOSIS — Y95 Nosocomial condition: Secondary | ICD-10-CM | POA: Diagnosis present

## 2015-12-18 DIAGNOSIS — R197 Diarrhea, unspecified: Secondary | ICD-10-CM | POA: Diagnosis present

## 2015-12-18 DIAGNOSIS — G9340 Encephalopathy, unspecified: Secondary | ICD-10-CM | POA: Diagnosis present

## 2015-12-18 DIAGNOSIS — Z8673 Personal history of transient ischemic attack (TIA), and cerebral infarction without residual deficits: Secondary | ICD-10-CM

## 2015-12-18 DIAGNOSIS — J189 Pneumonia, unspecified organism: Secondary | ICD-10-CM

## 2015-12-18 DIAGNOSIS — G894 Chronic pain syndrome: Secondary | ICD-10-CM | POA: Diagnosis present

## 2015-12-18 HISTORY — DX: Supraventricular tachycardia: I47.1

## 2015-12-18 HISTORY — DX: Gastro-esophageal reflux disease without esophagitis: K21.9

## 2015-12-18 HISTORY — DX: Supraventricular tachycardia, unspecified: I47.10

## 2015-12-18 LAB — URINALYSIS, ROUTINE W REFLEX MICROSCOPIC
BILIRUBIN URINE: NEGATIVE
GLUCOSE, UA: NEGATIVE mg/dL
KETONES UR: NEGATIVE mg/dL
Leukocytes, UA: NEGATIVE
Nitrite: NEGATIVE
Specific Gravity, Urine: 1.021 (ref 1.005–1.030)
pH: 6.5 (ref 5.0–8.0)

## 2015-12-18 LAB — CBC WITH DIFFERENTIAL/PLATELET
Basophils Absolute: 0 10*3/uL (ref 0.0–0.1)
Basophils Relative: 0 %
EOS ABS: 0.1 10*3/uL (ref 0.0–0.7)
EOS PCT: 1 %
HCT: 41.8 % (ref 36.0–46.0)
Hemoglobin: 13.6 g/dL (ref 12.0–15.0)
LYMPHS ABS: 3.2 10*3/uL (ref 0.7–4.0)
LYMPHS PCT: 25 %
MCH: 30.4 pg (ref 26.0–34.0)
MCHC: 32.5 g/dL (ref 30.0–36.0)
MCV: 93.5 fL (ref 78.0–100.0)
MONO ABS: 1.1 10*3/uL — AB (ref 0.1–1.0)
Monocytes Relative: 9 %
Neutro Abs: 8.6 10*3/uL — ABNORMAL HIGH (ref 1.7–7.7)
Neutrophils Relative %: 65 %
PLATELETS: 204 10*3/uL (ref 150–400)
RBC: 4.47 MIL/uL (ref 3.87–5.11)
RDW: 14.9 % (ref 11.5–15.5)
WBC: 12.9 10*3/uL — AB (ref 4.0–10.5)

## 2015-12-18 LAB — URINE MICROSCOPIC-ADD ON

## 2015-12-18 LAB — I-STAT CG4 LACTIC ACID, ED: Lactic Acid, Venous: 2.06 mmol/L (ref 0.5–2.0)

## 2015-12-18 MED ORDER — VANCOMYCIN HCL IN DEXTROSE 1-5 GM/200ML-% IV SOLN
1000.0000 mg | Freq: Once | INTRAVENOUS | Status: AC
  Administered 2015-12-18: 1000 mg via INTRAVENOUS
  Filled 2015-12-18: qty 200

## 2015-12-18 MED ORDER — ACETAMINOPHEN 500 MG PO TABS
1000.0000 mg | ORAL_TABLET | Freq: Once | ORAL | Status: AC
  Administered 2015-12-18: 1000 mg via ORAL
  Filled 2015-12-18: qty 2

## 2015-12-18 MED ORDER — DEXTROSE 5 % IV SOLN
2.0000 g | Freq: Once | INTRAVENOUS | Status: AC
  Administered 2015-12-18: 2 g via INTRAVENOUS
  Filled 2015-12-18: qty 2

## 2015-12-18 MED ORDER — ALBUTEROL SULFATE (2.5 MG/3ML) 0.083% IN NEBU: 2.5000 mg | INHALATION_SOLUTION | Freq: Once | RESPIRATORY_TRACT | Status: DC

## 2015-12-18 MED ORDER — ALBUTEROL (5 MG/ML) CONTINUOUS INHALATION SOLN
10.0000 mg/h | INHALATION_SOLUTION | Freq: Once | RESPIRATORY_TRACT | Status: AC
  Administered 2015-12-18: 10 mg/h via RESPIRATORY_TRACT
  Filled 2015-12-18: qty 20

## 2015-12-18 MED ORDER — SODIUM CHLORIDE 0.9 % IV BOLUS (SEPSIS)
30.0000 mL/kg | Freq: Once | INTRAVENOUS | Status: AC
Start: 2015-12-18 — End: 2015-12-19
  Administered 2015-12-18: 1000 mL via INTRAVENOUS

## 2015-12-18 MED ORDER — IPRATROPIUM-ALBUTEROL 0.5-2.5 (3) MG/3ML IN SOLN
3.0000 mL | Freq: Once | RESPIRATORY_TRACT | Status: AC
  Administered 2015-12-18: 3 mL via RESPIRATORY_TRACT
  Filled 2015-12-18: qty 3

## 2015-12-18 NOTE — ED Notes (Signed)
Bed: [id number] Expected date:  Expected time:  Means of arrival:  Comments: EMS 16F flu sym

## 2015-12-18 NOTE — Telephone Encounter (Signed)
[facility] Hard Copy dated 12/11/15 for emergency dispense given to Dr. Mariea Clonts to sign Sherrie Mustache on maternity leave.

## 2015-12-18 NOTE — ED Provider Notes (Signed)
CSN: [id number]     Arrival date & time 12/18/15  2228 History   First MD Initiated Contact with Patient 12/18/15 2234     Chief Complaint  Patient presents with  . Influenza     (Consider location/radiation/quality/duration/timing/severity/associated sxs/prior Treatment) HPI Report comes from nursing home of fever, cough and chills that started this morning. Patient has a poor historian. She is not adding historical information. She does endorse feeling sick. She does not endorse chest pain. Patient had a hospitalization for pneumonia having been discharged on 1\27. Past Medical History  Diagnosis Date  . Hypertension   . Head ache   . Renal insufficiency, mild   . Depression   . Fecal occult blood test positive   . Restless leg syndrome   . Anemia   . Cataracts, bilateral   . Hemorrhoids   . Rupture of rotator cuff of shoulder     s/p repair by Dr. Berenice Primas 7/07. Follows with Dr. Tamera Punt for possible shoulder surgery.   . Lumbar spinal stenosis     gets Spinal injections by Dr. Marlaine Hind   . Postnasal drip   . Atrial tachycardia ([place])     echo 04/08/11: EF 55-60%, mild LVH, grade 1 diast dysfxn;    s/p RFCA with Dr. Lovena Le  04/09/11  . Hyperkalemia 04/08/2011  . Unspecified deficiency anemia   . Osteoporosis, unspecified 04/09/2013    DEXA 5/07 : T L forearm -2.9. L femur -0.6, R femur -1.4. [name]  . CVA (cerebral infarction) 04/09/2013    MRI 2005 : Multiple tiny old lacunar infarcts as well as scattered white matter small vessel ischemic disease. Pt asymptomatic and denies TIA / CVA hx.    . Chronic pain syndrome 04/09/2013    On chronic opioids. Cervical MRI 11/ 2012 : Diffuse cervical spondylosis, Diffuse degenerative disc disease with multilevel foraminal stenosis. Possible T1 nerve root involvement. Lumbar MRI 2009 : prior fusion at L3-4 and degenerative disc disease Rotator cuff repair : Dr. Berenice Primas 7/07. Follows with Dr. Tamera Punt for possible shoulder surgery.   H/O lumbar spinal stenosis with steroid injections. Mo  . Arthritis     b/l shoulder R>L  . Rotator cuff tear arthropathy     03/22/13 dx [facility] (Dr. Tamera Punt) considering surgery  . Stroke ([place])   . History of blood transfusion 2014    anemia-   . SVT (supraventricular tachycardia) ([place])   . GERD (gastroesophageal reflux disease)    Past Surgical History  Procedure Laterality Date  . Doppler echocardiography  2004  . Rotator cuff repair      right  . Back surgery      lumbar x 2, 2000 and 2004 by Dr. Deri Fuelling.   . Eye surgery Bilateral     cataract  . Abdominal hysterectomy    . Orif finger fracture Left     ring finger  . Lumbar laminectomy/decompression microdiscectomy Left 08/06/2013    Procedure: Left Thoracic eleven-twelve microdiskectomy ;  Surgeon: Charlie Pitter, MD;  Location: [facility];  Service: Neurosurgery;  Laterality: Left;  Left Thoracic eleven-twelve microdiskectomy    Family History  Problem Relation Age of Onset  . Coronary artery disease Neg Hx     premature   Social History  Substance Use Topics  . Smoking status: Never Smoker   . Smokeless tobacco: None  . Alcohol Use: No   OB History    No data available  Review of Systems A cannot obtain due to confusion\demential level V caveat.   Allergies  Codeine and Ibuprofen  Home Medications   Prior to Admission medications   Medication Sig Start Date End Date Taking? Authorizing Provider  amLODipine (NORVASC) 5 MG tablet Take 1 tablet (5 mg total) by mouth daily. 08/09/15  Yes Gerlene Fee, NP  CALCIUM PO Take 600 mg by mouth 2 (two) times daily.    Yes Historical Provider, MD  carboxymethylcellulose (REFRESH PLUS) 0.5 % SOLN Place 1 drop into the right eye 3 (three) times daily.    Yes Historical Provider, MD  CefTRIAXone Sodium (ROCEPHIN IJ) Inject 1 g into the muscle daily.   Yes Historical Provider, MD  diclofenac sodium (VOLTAREN) 1 % GEL Apply 4 g topically 4  (four) times daily. Bilateral knees And 2 gm to right shoulder   Yes Historical Provider, MD  ferrous sulfate 325 (65 FE) MG tablet Take 325 mg by mouth 3 (three) times daily with meals.    Yes Historical Provider, MD  gabapentin (NEURONTIN) 600 MG tablet Take 600 mg by mouth 3 (three) times daily.   Yes Historical Provider, MD  HYDROcodone-acetaminophen (NORCO) 7.5-325 MG tablet Take 1 tablet by mouth every 6 (six) hours as needed for moderate pain.    Yes Historical Provider, MD  Linaclotide Rolan Lipa) 145 MCG CAPS capsule Take 145 mcg by mouth daily. Give 30 minutes prior to first meal.   Yes Historical Provider, MD  loperamide (IMODIUM) 2 MG capsule Take 2 mg by mouth as needed for diarrhea or loose stools.   Yes Historical Provider, MD  magnesium hydroxide (MILK OF MAGNESIA) 400 MG/5ML suspension Take 30 mLs by mouth daily as needed for mild constipation.   Yes Historical Provider, MD  Menthol-Methyl Salicylate (BENGAY GREASELESS EX) Apply 1 application topically 2 (two) times daily as needed. To hands   Yes Historical Provider, MD  methocarbamol (ROBAXIN) 500 MG tablet Take 500 mg by mouth 2 (two) times daily.   Yes Historical Provider, MD  polyethylene glycol (MIRALAX / GLYCOLAX) packet Take 17 g by mouth daily.   Yes Historical Provider, MD  potassium chloride SA (K-DUR,KLOR-CON) 20 MEQ tablet Take 20 mEq by mouth daily.   Yes Historical Provider, MD  ranitidine (ZANTAC) 150 MG tablet Take 150 mg by mouth at bedtime.   Yes Historical Provider, MD  sertraline (ZOLOFT) 25 MG tablet Take 25 mg by mouth daily. Give with 50 mg tab to equal 75 mg   Yes Historical Provider, MD  sertraline (ZOLOFT) 50 MG tablet Take 50 mg by mouth daily. Give with 25 mg tab to equal 75 mg total.   Yes Historical Provider, MD  oseltamivir (TAMIFLU) 75 MG capsule  12/18/15   Historical Provider, MD   BP 101/83 mmHg  Pulse 128  Temp(Src) 102.1 F (38.9 C) (Rectal)  Resp 22  SpO2 100% Physical Exam  Constitutional:   Patient is moderately ill and debilitated in appearance. She is alert and cooperative. She has intermittent wet cough. Mild increased work of breathing.  HENT:  Head: Normocephalic and atraumatic.  Right Ear: External ear normal.  Left Ear: External ear normal.  Mouth/Throat: Oropharynx is clear and moist.  Crusting around nasal passages.  Eyes: EOM are normal. Pupils are equal, round, and reactive to light.  Neck: Neck supple.  Cardiovascular: Regular rhythm, normal heart sounds and intact distal pulses.   Tachycardia with ectopy.  Pulmonary/Chest:  Wet cough. Mild increased work of breathing. Bilateral expiratory  wheeze. Decreased breath sounds at bases.  Abdominal: Soft. Bowel sounds are normal. She exhibits no distension. There is no tenderness.  Musculoskeletal: Normal range of motion. She exhibits no edema or tenderness.  Neurological: She is alert. She has normal strength. Coordination normal. GCS eye subscore is 4. GCS verbal subscore is 5. GCS motor subscore is 6.  Patient is awake and alert but mildly confused. She does assist following commands. She answers simple questions.  Skin: Skin is warm, dry and intact.  Psychiatric: She has a normal mood and affect.    ED Course  Procedures (including critical care time) CRITICAL CARE Performed by: Charlesetta Shanks   Total critical care time: 30 minutes  Critical care time was exclusive of separately billable procedures and treating other patients.  Critical care was necessary to treat or prevent imminent or life-threatening deterioration.  Critical care was time spent personally by me on the following activities: development of treatment plan with patient and/or surrogate as well as nursing, discussions with consultants, evaluation of patient's response to treatment, examination of patient, obtaining history from patient or surrogate, ordering and performing treatments and interventions, ordering and review of laboratory  studies, ordering and review of radiographic studies, pulse oximetry and re-evaluation of patient's condition. Labs Review Labs Reviewed  CBC WITH DIFFERENTIAL/PLATELET - Abnormal; Notable for the following:    WBC 12.9 (*)    Neutro Abs 8.6 (*)    Monocytes Absolute 1.1 (*)    All other components within normal limits  URINALYSIS, ROUTINE W REFLEX MICROSCOPIC (NOT AT [facility]) - Abnormal; Notable for the following:    Hgb urine dipstick TRACE (*)    Protein, ur >300 (*)    All other components within normal limits  URINE MICROSCOPIC-ADD ON - Abnormal; Notable for the following:    Squamous Epithelial / LPF 0-5 (*)    Bacteria, UA RARE (*)    Casts GRANULAR CAST (*)    All other components within normal limits  I-STAT CG4 LACTIC ACID, ED - Abnormal; Notable for the following:    Lactic Acid, Venous 2.06 (*)    All other components within normal limits  CULTURE, BLOOD (ROUTINE X 2)  CULTURE, BLOOD (ROUTINE X 2)  URINE CULTURE  INFLUENZA PANEL BY PCR (TYPE A & B, H1N1)  COMPREHENSIVE METABOLIC PANEL    Imaging Review No results found. I have personally reviewed and evaluated these images and lab results as part of my medical decision-making.   EKG Interpretation   Date/Time:  Friday December 18 2015 23:38:07 EST Ventricular Rate:  129 PR Interval:    QRS Duration: 106 QT Interval:  319 QTC Calculation: 467 R Axis:   115 Text Interpretation:  SR with PACs. no STEMI. aBNORMAL tWAVES. RATE  RELATED INCREASED T WAVE DEPRESSION. Confirmed by Johnney Killian, MD, Jeannie Done  [phone]) on 12/18/2015 11:44:36 PM      MDM   Final diagnoses:  HCAP (healthcare-associated pneumonia)  Sepsis, due to unspecified organism [facility])   Patient has fever of 102. Lactic acid is elevated and patient is tachycardic. At this time she is treated for sepsis with suspected source of pneumonia with cough and wheeze. Patient had prior admission at the end of January for pneumonia. Treatment is initiated for  HCAP.    Charlesetta Shanks, MD 12/19/15 [phone]

## 2015-12-18 NOTE — ED Notes (Signed)
RT called for continuous neb 

## 2015-12-18 NOTE — ED Notes (Signed)
Informed Dr. Johnney Killian of lactic acid of 2.06.

## 2015-12-18 NOTE — Progress Notes (Signed)
Patient ID: [name], female   DOB: 06-20-1925, 82 y.o.   MRN: [id number]   Facility: [facility]       Allergies  Allergen Reactions  . Codeine     Hallucinations  . Ibuprofen     Confusion    Chief Complaint  Patient presents with  . Acute Visit    follow up kub     HPI:  She has been having liquid stools and her linzess was stopped. Her kub does show constipation and this will need to be addressed. She is unable to fully participate in the hpi or ros; but told me that she is not having pain.    Past Medical History  Diagnosis Date  . Hypertension   . Head ache   . Renal insufficiency, mild   . Depression   . Fecal occult blood test positive   . Restless leg syndrome   . Anemia   . Cataracts, bilateral   . Hemorrhoids   . Rupture of rotator cuff of shoulder     s/p repair by Dr. Berenice Primas 7/07. Follows with Dr. Tamera Punt for possible shoulder surgery.   . Lumbar spinal stenosis     gets Spinal injections by Dr. Marlaine Hind   . Postnasal drip   . Atrial tachycardia ([place])     echo 04/08/11: EF 55-60%, mild LVH, grade 1 diast dysfxn;    s/p RFCA with Dr. Lovena Le  04/09/11  . Hyperkalemia 04/08/2011  . Unspecified deficiency anemia   . Osteoporosis, unspecified 04/09/2013    DEXA 5/07 : T L forearm -2.9. L femur -0.6, R femur -1.4. [name]  . CVA (cerebral infarction) 04/09/2013    MRI 2005 : Multiple tiny old lacunar infarcts as well as scattered white matter small vessel ischemic disease. Pt asymptomatic and denies TIA / CVA hx.    . Chronic pain syndrome 04/09/2013    On chronic opioids. Cervical MRI 11/ 2012 : Diffuse cervical spondylosis, Diffuse degenerative disc disease with multilevel foraminal stenosis. Possible T1 nerve root involvement. Lumbar MRI 2009 : prior fusion at L3-4 and degenerative disc disease Rotator cuff repair : Dr. Berenice Primas 7/07. Follows with Dr. Tamera Punt for possible shoulder surgery.  H/O lumbar spinal stenosis with steroid  injections. Mo  . Arthritis     b/l shoulder R>L  . Rotator cuff tear arthropathy     03/22/13 dx [facility] (Dr. Tamera Punt) considering surgery  . Stroke ([place])   . History of blood transfusion 2014    anemia-     Past Surgical History  Procedure Laterality Date  . Doppler echocardiography  2004  . Rotator cuff repair      right  . Back surgery      lumbar x 2, 2000 and 2004 by Dr. Deri Fuelling.   . Eye surgery Bilateral     cataract  . Abdominal hysterectomy    . Orif finger fracture Left     ring finger  . Lumbar laminectomy/decompression microdiscectomy Left 08/06/2013    Procedure: Left Thoracic eleven-twelve microdiskectomy ;  Surgeon: Charlie Pitter, MD;  Location: [facility];  Service: Neurosurgery;  Laterality: Left;  Left Thoracic eleven-twelve microdiskectomy     VITAL SIGNS BP 136/74 mmHg  Pulse 82  Temp(Src) 98.5 F (36.9 C) (Oral)  Resp 18  Ht 5\' 2"  (1.575 m)  Wt 157 lb (71.215 kg)  BMI 28.71 kg/m2  SpO2 95%  Patient's Medications  New Prescriptions   No  medications on file  Previous Medications   AMLODIPINE (NORVASC) 5 MG TABLET    Take 1 tablet (5 mg total) by mouth daily.   CALCIUM PO    Take 600 mg by mouth 2 (two) times daily.    CARBOXYMETHYLCELLULOSE (REFRESH PLUS) 0.5 % SOLN    Place 1 drop into the right eye 3 (three) times daily.    DICLOFENAC SODIUM (VOLTAREN) 1 % GEL    Apply 4 g topically 4 (four) times daily. Bilateral knees And 2 gm to right shoulder   FERROUS SULFATE 325 (65 FE) MG TABLET    Take 325 mg by mouth daily.   GABAPENTIN (NEURONTIN) 600 MG TABLET    Take 600 mg by mouth 3 (three) times daily.   HYDROCODONE-ACETAMINOPHEN (NORCO) 7.5-325 MG TABLET    Take 1 tablet by mouth every 6 (six) hours as needed for moderate pain.   LOPERAMIDE (IMODIUM) 2 MG CAPSULE    Take 2 mg by mouth as needed for diarrhea or loose stools.   MENTHOL-METHYL SALICYLATE (BENGAY GREASELESS EX)    Apply 1 application topically 2 (two) times daily as  needed. To hands   METHOCARBAMOL (ROBAXIN) 500 MG TABLET    Take 500 mg by mouth 2 (two) times daily.   POTASSIUM CHLORIDE SA (K-DUR,KLOR-CON) 20 MEQ TABLET    Take 20 mEq by mouth daily.   RANITIDINE (ZANTAC) 150 MG TABLET    Take 150 mg by mouth at bedtime.   SERTRALINE (ZOLOFT) 25 MG TABLET    Take 25 mg by mouth daily. Give with 50 mg tab to equal 75 mg   SERTRALINE (ZOLOFT) 50 MG TABLET    Take 50 mg by mouth daily. Give with 25 mg tab to equal 75 mg total.  Modified Medications   No medications on file  Discontinued Medications   No medications on file     SIGNIFICANT DIAGNOSTIC EXAMS   10-19-14: chest x-ray: Bilateral patchy interstitial and alveolar airspace opacities most concerning for multilobar pneumonia including atypical infection  10-24-14: -d echo: - Left ventricle: The cavity size was normal. There was mild focal basal hypertrophy of the septum. Systolic function was normal. The estimated ejection fraction was in the range of 55% to 60%. Wall motion was normal; there were no regional wall motion abnormalities. Features are consistent with a pseudonormal left ventricular filling pattern, with concomitant abnormal relaxation and increased filling pressure (grade 2 diastolic dysfunction). - Aortic valve: There was mild stenosis.  - Mitral valve: There was moderate regurgitation. - Left atrium: The atrium was moderately dilated. - Atrial septum: There was a possible patent foramen ovale. - Tricuspid valve: There was moderate regurgitation. - Pulmonary arteries: Systolic pressure was severely increased. PA peak pressure: 86 mm Hg (S).  10-27-14: Chest x-ray: Improvement in aeration without pulmonary edema or segmental infiltrate. Mild hyperinflation. Degenerative changes thoracolumbar spine and bilateral shoulders.  10-29-14: right ankle x-ray: 1. No acute bony findings. 2. Bony demineralization. 3. Degenerative findings along the posterior subtalar facet. 4. Vascular  calcifications.  10-29-14: left ankle x-ray: 1. No fracture or acute bony findings identified. 2. Sensitivity is significantly reduced due to the severity of bony demineralization. If there is a high index of suspicion of occult bony injury, MRI of the ankle would be suggested  11-05-14: chest x-ray: Mild central pulmonary vascular congestion. New mild right basilar opacity is noted concerning for subsegmental atelectasis or possibly pneumonia. Followup radiographs are recommended.  12-17-15: kub: partial constipation  LABS REVIEWED:   12-22-14: glucose 58; bun 12; creat 0.75; k+ 4.5; na++ 142; liver normal albumin 2.2 04-20-15: glucose 73; bun 18; creat 0.86; k+ 3.5; na++142; liver normal albumin 2.6; tsh 3.061  08-21-15: wbc 7.6; hgb 11.;3 hct 34.;3 mcv 91.7; plt 206; glucose 87; bun 19; creat 0.88; k+ 3.4; na++141; liver normal albumin 2.9 08-31-15: k+ 3.9       Review of Systems Unable to perform ROS: Dementia     Physical Exam Constitutional: No distress.  Eyes: Conjunctivae are normal.  Neck: Neck supple. No JVD present. No thyromegaly present.  Cardiovascular: Normal rate, regular rhythm and intact distal pulses.   Respiratory: Effort normal and breath sounds normal. No respiratory distress. She has no wheezes.  GI: Soft. Bowel sounds are normal. She exhibits no distension. There is no tenderness.  Musculoskeletal: She exhibits no edema.  Able to move all extremities   Lymphadenopathy:    She has no cervical adenopathy.  Neurological: She is alert.  Skin: Skin is warm and dry. She is not diaphoretic.  Psychiatric: She has a normal mood and affect.      ASSESSMENT/ PLAN:  1. Constipation: will give her mom 30 cc now and will begin miralax twice daily     Ok Edwards NP [facility]  Contact [phone] Monday through Friday 8am- 5pm  After hours call [phone]

## 2015-12-18 NOTE — ED Notes (Addendum)
Notified RN,Autumn when pt. Was rolled her sacrum was pink with white ointment.

## 2015-12-18 NOTE — Progress Notes (Signed)
Spoke with Amie, RN for continuous neb order. She has informed me that the order will be placed by another RN. Awaiting clarification order to be placed in computer.

## 2015-12-18 NOTE — ED Notes (Signed)
Pt transported from [facility] for fever, cough, chills onset this am.

## 2015-12-19 ENCOUNTER — Inpatient Hospital Stay (HOSPITAL_COMMUNITY): Payer: Medicare Other

## 2015-12-19 DIAGNOSIS — J1000 Influenza due to other identified influenza virus with unspecified type of pneumonia: Secondary | ICD-10-CM | POA: Diagnosis present

## 2015-12-19 DIAGNOSIS — R0989 Other specified symptoms and signs involving the circulatory and respiratory systems: Secondary | ICD-10-CM | POA: Diagnosis present

## 2015-12-19 DIAGNOSIS — J101 Influenza due to other identified influenza virus with other respiratory manifestations: Secondary | ICD-10-CM | POA: Diagnosis present

## 2015-12-19 DIAGNOSIS — J111 Influenza due to unidentified influenza virus with other respiratory manifestations: Secondary | ICD-10-CM | POA: Diagnosis present

## 2015-12-19 DIAGNOSIS — D649 Anemia, unspecified: Secondary | ICD-10-CM | POA: Diagnosis present

## 2015-12-19 DIAGNOSIS — Y95 Nosocomial condition: Secondary | ICD-10-CM | POA: Diagnosis present

## 2015-12-19 DIAGNOSIS — R0602 Shortness of breath: Secondary | ICD-10-CM | POA: Diagnosis present

## 2015-12-19 DIAGNOSIS — A419 Sepsis, unspecified organism: Principal | ICD-10-CM

## 2015-12-19 DIAGNOSIS — I10 Essential (primary) hypertension: Secondary | ICD-10-CM

## 2015-12-19 DIAGNOSIS — I471 Supraventricular tachycardia: Secondary | ICD-10-CM | POA: Diagnosis present

## 2015-12-19 DIAGNOSIS — Z981 Arthrodesis status: Secondary | ICD-10-CM | POA: Diagnosis not present

## 2015-12-19 DIAGNOSIS — J189 Pneumonia, unspecified organism: Secondary | ICD-10-CM | POA: Diagnosis not present

## 2015-12-19 DIAGNOSIS — Z8673 Personal history of transient ischemic attack (TIA), and cerebral infarction without residual deficits: Secondary | ICD-10-CM | POA: Diagnosis not present

## 2015-12-19 DIAGNOSIS — Z79891 Long term (current) use of opiate analgesic: Secondary | ICD-10-CM | POA: Diagnosis not present

## 2015-12-19 DIAGNOSIS — F329 Major depressive disorder, single episode, unspecified: Secondary | ICD-10-CM | POA: Diagnosis present

## 2015-12-19 DIAGNOSIS — R197 Diarrhea, unspecified: Secondary | ICD-10-CM | POA: Diagnosis present

## 2015-12-19 DIAGNOSIS — G894 Chronic pain syndrome: Secondary | ICD-10-CM | POA: Diagnosis present

## 2015-12-19 DIAGNOSIS — K219 Gastro-esophageal reflux disease without esophagitis: Secondary | ICD-10-CM | POA: Diagnosis present

## 2015-12-19 DIAGNOSIS — G9340 Encephalopathy, unspecified: Secondary | ICD-10-CM | POA: Diagnosis present

## 2015-12-19 LAB — COMPREHENSIVE METABOLIC PANEL
ALBUMIN: 2.9 g/dL — AB (ref 3.5–5.0)
ALK PHOS: 106 U/L (ref 38–126)
ALT: 20 U/L (ref 14–54)
ALT: 21 U/L (ref 14–54)
ALT: 25 U/L (ref 14–54)
AST: 51 U/L — AB (ref 15–41)
AST: 57 U/L — ABNORMAL HIGH (ref 15–41)
AST: 80 U/L — AB (ref 15–41)
Albumin: 2.9 g/dL — ABNORMAL LOW (ref 3.5–5.0)
Albumin: 3.3 g/dL — ABNORMAL LOW (ref 3.5–5.0)
Alkaline Phosphatase: 108 U/L (ref 38–126)
Alkaline Phosphatase: 142 U/L — ABNORMAL HIGH (ref 38–126)
Anion gap: 11 (ref 5–15)
Anion gap: 11 (ref 5–15)
BILIRUBIN TOTAL: 0.7 mg/dL (ref 0.3–1.2)
BUN: 16 mg/dL (ref 6–20)
BUN: 16 mg/dL (ref 6–20)
BUN: 21 mg/dL — AB (ref 6–20)
CALCIUM: 8.5 mg/dL — AB (ref 8.9–10.3)
CHLORIDE: 105 mmol/L (ref 101–111)
CHLORIDE: 113 mmol/L — AB (ref 101–111)
CO2: 21 mmol/L — AB (ref 22–32)
CO2: 22 mmol/L (ref 22–32)
CREATININE: 0.85 mg/dL (ref 0.44–1.00)
Calcium: 9 mg/dL (ref 8.9–10.3)
Creatinine, Ser: 0.87 mg/dL (ref 0.44–1.00)
Creatinine, Ser: 1.05 mg/dL — ABNORMAL HIGH (ref 0.44–1.00)
GFR calc non Af Amer: 58 mL/min — ABNORMAL LOW (ref >60)
GFR, EST AFRICAN AMERICAN: 53 mL/min — AB (ref >60)
GFR, EST NON AFRICAN AMERICAN: 46 mL/min — AB (ref >60)
GFR, EST NON AFRICAN AMERICAN: 59 mL/min — AB (ref >60)
GLUCOSE: 124 mg/dL — AB (ref 65–99)
GLUCOSE: 125 mg/dL — AB (ref 65–99)
Glucose, Bld: 121 mg/dL — ABNORMAL HIGH (ref 65–99)
POTASSIUM: 4.6 mmol/L (ref 3.5–5.1)
Potassium: 3.5 mmol/L (ref 3.5–5.1)
SODIUM: 145 mmol/L (ref 135–145)
Sodium: 138 mmol/L (ref 135–145)
Total Bilirubin: 0.3 mg/dL (ref 0.3–1.2)
Total Bilirubin: 0.4 mg/dL (ref 0.3–1.2)
Total Protein: 8.2 g/dL — ABNORMAL HIGH (ref 6.5–8.1)
Total Protein: 8.5 g/dL — ABNORMAL HIGH (ref 6.5–8.1)
Total Protein: 9.6 g/dL — ABNORMAL HIGH (ref 6.5–8.1)

## 2015-12-19 LAB — INFLUENZA PANEL BY PCR (TYPE A & B)
H1N1FLUPCR: NOT DETECTED
INFLBPCR: NEGATIVE
Influenza A By PCR: POSITIVE — AB

## 2015-12-19 LAB — PROTIME-INR
INR: 1.25 (ref 0.00–1.49)
Prothrombin Time: 15.3 seconds — ABNORMAL HIGH (ref 11.6–15.2)

## 2015-12-19 LAB — CBC
HCT: 36.1 % (ref 36.0–46.0)
HEMOGLOBIN: 11.4 g/dL — AB (ref 12.0–15.0)
MCH: 29.5 pg (ref 26.0–34.0)
MCHC: 31.6 g/dL (ref 30.0–36.0)
MCV: 93.5 fL (ref 78.0–100.0)
Platelets: 184 10*3/uL (ref 150–400)
RBC: 3.86 MIL/uL — AB (ref 3.87–5.11)
RDW: 15.3 % (ref 11.5–15.5)
WBC: 15.3 10*3/uL — AB (ref 4.0–10.5)

## 2015-12-19 LAB — MRSA PCR SCREENING: MRSA by PCR: POSITIVE — AB

## 2015-12-19 LAB — PROCALCITONIN

## 2015-12-19 LAB — BRAIN NATRIURETIC PEPTIDE: B NATRIURETIC PEPTIDE 5: 209.3 pg/mL — AB (ref 0.0–100.0)

## 2015-12-19 LAB — LACTIC ACID, PLASMA
LACTIC ACID, VENOUS: 4.5 mmol/L — AB (ref 0.5–2.0)
Lactic Acid, Venous: 2.4 mmol/L (ref 0.5–2.0)

## 2015-12-19 LAB — APTT: APTT: 32 s (ref 24–37)

## 2015-12-19 LAB — TROPONIN I: TROPONIN I: 0.06 ng/mL — AB (ref <0.031)

## 2015-12-19 LAB — MAGNESIUM: MAGNESIUM: 1.6 mg/dL — AB (ref 1.7–2.4)

## 2015-12-19 MED ORDER — DICLOFENAC SODIUM 1 % TD GEL
4.0000 g | Freq: Four times a day (QID) | TRANSDERMAL | Status: DC
  Administered 2015-12-19 – 2015-12-21 (×9): 4 g via TOPICAL
  Filled 2015-12-19: qty 100

## 2015-12-19 MED ORDER — SODIUM CHLORIDE 0.9 % IV BOLUS (SEPSIS)
500.0000 mL | INTRAVENOUS | Status: DC
Start: 2015-12-19 — End: 2015-12-19

## 2015-12-19 MED ORDER — LEVALBUTEROL HCL 0.63 MG/3ML IN NEBU: 0.6300 mg | INHALATION_SOLUTION | RESPIRATORY_TRACT | Status: DC | PRN

## 2015-12-19 MED ORDER — IPRATROPIUM BROMIDE 0.02 % IN SOLN
0.5000 mg | Freq: Four times a day (QID) | RESPIRATORY_TRACT | Status: DC
  Administered 2015-12-19 – 2015-12-21 (×11): 0.5 mg via RESPIRATORY_TRACT
  Filled 2015-12-19 (×11): qty 2.5

## 2015-12-19 MED ORDER — IPRATROPIUM BROMIDE 0.02 % IN SOLN: 0.5000 mg | RESPIRATORY_TRACT | Status: DC

## 2015-12-19 MED ORDER — ENOXAPARIN SODIUM 40 MG/0.4ML ~~LOC~~ SOLN
40.0000 mg | SUBCUTANEOUS | Status: DC
Start: 2015-12-19 — End: 2015-12-21
  Administered 2015-12-19 – 2015-12-21 (×3): 40 mg via SUBCUTANEOUS
  Filled 2015-12-19 (×4): qty 0.4

## 2015-12-19 MED ORDER — LEVALBUTEROL HCL 0.63 MG/3ML IN NEBU: 0.6300 mg | INHALATION_SOLUTION | Freq: Four times a day (QID) | RESPIRATORY_TRACT | Status: DC | PRN

## 2015-12-19 MED ORDER — SODIUM CHLORIDE 0.9 % IV BOLUS (SEPSIS): 1000.0000 mL | INTRAVENOUS | Status: DC

## 2015-12-19 MED ORDER — SODIUM CHLORIDE 0.9 % IV BOLUS (SEPSIS): 250.0000 mL | Freq: Once | INTRAVENOUS | Status: AC

## 2015-12-19 MED ORDER — LEVALBUTEROL HCL 1.25 MG/0.5ML IN NEBU: 1.2500 mg | INHALATION_SOLUTION | RESPIRATORY_TRACT | Status: DC

## 2015-12-19 MED ORDER — METHOCARBAMOL 500 MG PO TABS
500.0000 mg | ORAL_TABLET | Freq: Two times a day (BID) | ORAL | Status: DC
  Administered 2015-12-19 – 2015-12-21 (×5): 500 mg via ORAL
  Filled 2015-12-19 (×6): qty 1

## 2015-12-19 MED ORDER — ARFORMOTEROL TARTRATE 15 MCG/2ML IN NEBU
15.0000 ug | INHALATION_SOLUTION | Freq: Two times a day (BID) | RESPIRATORY_TRACT | Status: DC
  Administered 2015-12-19 – 2015-12-21 (×5): 15 ug via RESPIRATORY_TRACT
  Filled 2015-12-19 (×11): qty 2

## 2015-12-19 MED ORDER — FAMOTIDINE 20 MG PO TABS
20.0000 mg | ORAL_TABLET | Freq: Every day | ORAL | Status: DC
  Administered 2015-12-19 – 2015-12-20 (×2): 20 mg via ORAL
  Filled 2015-12-19 (×3): qty 1

## 2015-12-19 MED ORDER — FERROUS SULFATE 325 (65 FE) MG PO TABS
325.0000 mg | ORAL_TABLET | Freq: Three times a day (TID) | ORAL | Status: DC
  Administered 2015-12-19 – 2015-12-21 (×7): 325 mg via ORAL
  Filled 2015-12-19 (×8): qty 1

## 2015-12-19 MED ORDER — SODIUM CHLORIDE 0.9% FLUSH
3.0000 mL | Freq: Two times a day (BID) | INTRAVENOUS | Status: DC
  Administered 2015-12-19 – 2015-12-21 (×6): 3 mL via INTRAVENOUS

## 2015-12-19 MED ORDER — GABAPENTIN 300 MG PO CAPS
600.0000 mg | ORAL_CAPSULE | Freq: Three times a day (TID) | ORAL | Status: DC
  Administered 2015-12-19 – 2015-12-21 (×7): 600 mg via ORAL
  Filled 2015-12-19 (×8): qty 2

## 2015-12-19 MED ORDER — GUAIFENESIN ER 600 MG PO TB12
600.0000 mg | ORAL_TABLET | Freq: Two times a day (BID) | ORAL | Status: DC
  Administered 2015-12-19 – 2015-12-21 (×5): 600 mg via ORAL
  Filled 2015-12-19 (×7): qty 1

## 2015-12-19 MED ORDER — MUPIROCIN 2 % EX OINT
1.0000 "application " | TOPICAL_OINTMENT | Freq: Two times a day (BID) | CUTANEOUS | Status: DC
  Administered 2015-12-19 – 2015-12-21 (×5): 1 via NASAL
  Filled 2015-12-19 (×2): qty 22

## 2015-12-19 MED ORDER — AMLODIPINE BESYLATE 5 MG PO TABS
5.0000 mg | ORAL_TABLET | Freq: Every day | ORAL | Status: DC
  Administered 2015-12-19 – 2015-12-21 (×3): 5 mg via ORAL
  Filled 2015-12-19 (×4): qty 1

## 2015-12-19 MED ORDER — CHLORHEXIDINE GLUCONATE CLOTH 2 % EX PADS
6.0000 | MEDICATED_PAD | Freq: Every day | CUTANEOUS | Status: DC
  Administered 2015-12-19 – 2015-12-20 (×2): 6 via TOPICAL

## 2015-12-19 MED ORDER — HYDROCODONE-ACETAMINOPHEN 7.5-325 MG PO TABS: 1.0000 | ORAL_TABLET | Freq: Four times a day (QID) | ORAL | Status: DC | PRN

## 2015-12-19 MED ORDER — SERTRALINE HCL 50 MG PO TABS
50.0000 mg | ORAL_TABLET | Freq: Every day | ORAL | Status: DC
  Administered 2015-12-19 – 2015-12-21 (×3): 50 mg via ORAL
  Filled 2015-12-19 (×3): qty 1

## 2015-12-19 MED ORDER — PANTOPRAZOLE SODIUM 40 MG IV SOLR
40.0000 mg | Freq: Two times a day (BID) | INTRAVENOUS | Status: DC
  Administered 2015-12-19 – 2015-12-20 (×4): 40 mg via INTRAVENOUS
  Filled 2015-12-19 (×4): qty 40

## 2015-12-19 MED ORDER — POLYVINYL ALCOHOL 1.4 % OP SOLN
1.0000 [drp] | Freq: Three times a day (TID) | OPHTHALMIC | Status: DC
  Administered 2015-12-19 – 2015-12-21 (×8): 1 [drp] via OPHTHALMIC
  Filled 2015-12-19: qty 15

## 2015-12-19 MED ORDER — CARBOXYMETHYLCELLULOSE SODIUM 0.5 % OP SOLN: 1.0000 [drp] | Freq: Three times a day (TID) | OPHTHALMIC | Status: DC

## 2015-12-19 MED ORDER — DEXTROSE 5 % IV SOLN
1.0000 g | INTRAVENOUS | Status: DC
  Filled 2015-12-19: qty 1

## 2015-12-19 MED ORDER — ACETAMINOPHEN 325 MG PO TABS
650.0000 mg | ORAL_TABLET | Freq: Four times a day (QID) | ORAL | Status: DC | PRN
  Administered 2015-12-19: 650 mg via ORAL
  Filled 2015-12-19: qty 2

## 2015-12-19 MED ORDER — CETYLPYRIDINIUM CHLORIDE 0.05 % MT LIQD
7.0000 mL | Freq: Two times a day (BID) | OROMUCOSAL | Status: DC
  Administered 2015-12-19 – 2015-12-21 (×5): 7 mL via OROMUCOSAL

## 2015-12-19 MED ORDER — LINACLOTIDE 145 MCG PO CAPS
145.0000 ug | ORAL_CAPSULE | Freq: Every day | ORAL | Status: DC
  Administered 2015-12-20 – 2015-12-21 (×2): 145 ug via ORAL
  Filled 2015-12-19 (×3): qty 1

## 2015-12-19 MED ORDER — LEVALBUTEROL HCL 1.25 MG/0.5ML IN NEBU: 1.2500 mg | INHALATION_SOLUTION | RESPIRATORY_TRACT | Status: DC | PRN

## 2015-12-19 MED ORDER — SODIUM CHLORIDE 0.9 % IV BOLUS (SEPSIS)
1000.0000 mL | Freq: Once | INTRAVENOUS | Status: AC
  Administered 2015-12-19: 1000 mL via INTRAVENOUS

## 2015-12-19 MED ORDER — POTASSIUM CHLORIDE CRYS ER 20 MEQ PO TBCR
20.0000 meq | EXTENDED_RELEASE_TABLET | Freq: Every day | ORAL | Status: DC
  Administered 2015-12-19 – 2015-12-21 (×3): 20 meq via ORAL
  Filled 2015-12-19 (×4): qty 1

## 2015-12-19 MED ORDER — ONDANSETRON HCL 4 MG/2ML IJ SOLN: 4.0000 mg | Freq: Four times a day (QID) | INTRAMUSCULAR | Status: DC | PRN

## 2015-12-19 MED ORDER — SERTRALINE HCL 25 MG PO TABS
25.0000 mg | ORAL_TABLET | Freq: Every day | ORAL | Status: DC
  Administered 2015-12-19 – 2015-12-21 (×3): 25 mg via ORAL
  Filled 2015-12-19 (×3): qty 1

## 2015-12-19 MED ORDER — LEVALBUTEROL HCL 0.63 MG/3ML IN NEBU
0.6300 mg | INHALATION_SOLUTION | Freq: Four times a day (QID) | RESPIRATORY_TRACT | Status: DC
  Administered 2015-12-20 – 2015-12-21 (×7): 0.63 mg via RESPIRATORY_TRACT
  Filled 2015-12-19 (×7): qty 3

## 2015-12-19 MED ORDER — VANCOMYCIN HCL IN DEXTROSE 1-5 GM/200ML-% IV SOLN: 1000.0000 mg | INTRAVENOUS | Status: DC

## 2015-12-19 MED ORDER — OSELTAMIVIR PHOSPHATE 75 MG PO CAPS
75.0000 mg | ORAL_CAPSULE | Freq: Two times a day (BID) | ORAL | Status: DC
  Administered 2015-12-19: 75 mg via ORAL
  Filled 2015-12-19 (×3): qty 1

## 2015-12-19 MED ORDER — MAGNESIUM HYDROXIDE 400 MG/5ML PO SUSP: 30.0000 mL | Freq: Every day | ORAL | Status: DC | PRN

## 2015-12-19 MED ORDER — BUDESONIDE 0.5 MG/2ML IN SUSP
0.5000 mg | Freq: Two times a day (BID) | RESPIRATORY_TRACT | Status: DC
  Administered 2015-12-19 – 2015-12-21 (×5): 0.5 mg via RESPIRATORY_TRACT
  Filled 2015-12-19 (×5): qty 2

## 2015-12-19 MED ORDER — MAGNESIUM SULFATE IN D5W 10-5 MG/ML-% IV SOLN
1.0000 g | Freq: Once | INTRAVENOUS | Status: AC
  Administered 2015-12-19: 1 g via INTRAVENOUS
  Filled 2015-12-19: qty 100

## 2015-12-19 MED ORDER — POLYETHYLENE GLYCOL 3350 17 G PO PACK
17.0000 g | PACK | Freq: Every day | ORAL | Status: DC
  Administered 2015-12-19: 17 g via ORAL
  Filled 2015-12-19 (×2): qty 1

## 2015-12-19 MED ORDER — ONDANSETRON HCL 4 MG PO TABS: 4.0000 mg | ORAL_TABLET | Freq: Four times a day (QID) | ORAL | Status: DC | PRN

## 2015-12-19 MED ORDER — OSELTAMIVIR PHOSPHATE 30 MG PO CAPS
30.0000 mg | ORAL_CAPSULE | Freq: Two times a day (BID) | ORAL | Status: DC
  Administered 2015-12-19 – 2015-12-21 (×4): 30 mg via ORAL
  Filled 2015-12-19 (×5): qty 1

## 2015-12-19 MED ORDER — HYDROCODONE-ACETAMINOPHEN 7.5-325 MG PO TABS
1.0000 | ORAL_TABLET | Freq: Four times a day (QID) | ORAL | Status: DC | PRN
  Administered 2015-12-19: 1 via ORAL
  Filled 2015-12-19: qty 1

## 2015-12-19 MED ORDER — LEVALBUTEROL HCL 0.63 MG/3ML IN NEBU
0.6300 mg | INHALATION_SOLUTION | RESPIRATORY_TRACT | Status: DC
  Administered 2015-12-19 (×4): 0.63 mg via RESPIRATORY_TRACT
  Filled 2015-12-19 (×4): qty 3

## 2015-12-19 NOTE — H&P (Signed)
[facility] History and Physical  [name] [id number] DOB: 1925-05-05 DOA: 12/18/2015  Referring physician: ED  PCP: [name], DO   Chief Complaint: Fever and cough  HPI:  [name] is a 82 year old female with past medical history significant for hypertension, anemia, atrial tachycardia, depression, renal insufficiency, CVA; who presents from [place] living facility with fever, cough, and chills starting this morning. History is obtained from review of records has patient is critically ill at this time and cannot provide her own history. There was note of multiple  resistance of the nursing facility that had come down with flu recently. Upon review of patient's MAR it appears that she received 1 dose of Tamiflu this morning prior to being transferred to the emergency department for further evaluation.    Review of Systems  Unable to perform ROS: critical illness    Past Medical History  Diagnosis Date  . Hypertension   . Head ache   . Renal insufficiency, mild   . Depression   . Fecal occult blood test positive   . Restless leg syndrome   . Anemia   . Cataracts, bilateral   . Hemorrhoids   . Rupture of rotator cuff of shoulder     s/p repair by Dr. Berenice Primas 7/07. Follows with Dr. Tamera Punt for possible shoulder surgery.   . Lumbar spinal stenosis     gets Spinal injections by Dr. Marlaine Hind   . Postnasal drip   . Atrial tachycardia ([place])     echo 04/08/11: EF 55-60%, mild LVH, grade 1 diast dysfxn;    s/p RFCA with Dr. Lovena Le  04/09/11  . Hyperkalemia 04/08/2011  . Unspecified deficiency anemia   . Osteoporosis, unspecified 04/09/2013    DEXA 5/07 : T L forearm -2.9. L femur -0.6, R femur -1.4. [name]  . CVA (cerebral infarction) 04/09/2013    MRI 2005 : Multiple tiny old lacunar infarcts as well as scattered white matter small vessel ischemic disease. Pt asymptomatic and denies TIA / CVA hx.    . Chronic pain syndrome 04/09/2013    On chronic  opioids. Cervical MRI 11/ 2012 : Diffuse cervical spondylosis, Diffuse degenerative disc disease with multilevel foraminal stenosis. Possible T1 nerve root involvement. Lumbar MRI 2009 : prior fusion at L3-4 and degenerative disc disease Rotator cuff repair : Dr. Berenice Primas 7/07. Follows with Dr. Tamera Punt for possible shoulder surgery.  H/O lumbar spinal stenosis with steroid injections. Mo  . Arthritis     b/l shoulder R>L  . Rotator cuff tear arthropathy     03/22/13 dx [facility] (Dr. Tamera Punt) considering surgery  . Stroke ([place])   . History of blood transfusion 2014    anemia-   . SVT (supraventricular tachycardia) ([place])   . GERD (gastroesophageal reflux disease)      Past Surgical History  Procedure Laterality Date  . Doppler echocardiography  2004  . Rotator cuff repair      right  . Back surgery      lumbar x 2, 2000 and 2004 by Dr. Deri Fuelling.   . Eye surgery Bilateral     cataract  . Abdominal hysterectomy    . Orif finger fracture Left     ring finger  . Lumbar laminectomy/decompression microdiscectomy Left 08/06/2013    Procedure: Left Thoracic eleven-twelve microdiskectomy ;  Surgeon: Charlie Pitter, MD;  Location: [facility];  Service: Neurosurgery;  Laterality: Left;  Left Thoracic eleven-twelve microdiskectomy  Social History:  reports that she has never smoked. She does not have any smokeless tobacco history on file. She reports that she does not drink alcohol or use illicit drugs.   Allergies  Allergen Reactions  . Codeine     Hallucinations  . Ibuprofen     Confusion    Family History  Problem Relation Age of Onset  . Coronary artery disease Neg Hx     premature       Prior to Admission medications   Medication Sig Start Date End Date Taking? Authorizing Provider  amLODipine (NORVASC) 5 MG tablet Take 1 tablet (5 mg total) by mouth daily. 08/09/15  Yes Gerlene Fee, NP  CALCIUM PO Take 600 mg by mouth 2 (two) times daily.    Yes  Historical Provider, MD  carboxymethylcellulose (REFRESH PLUS) 0.5 % SOLN Place 1 drop into the right eye 3 (three) times daily.    Yes Historical Provider, MD  CefTRIAXone Sodium (ROCEPHIN IJ) Inject 1 g into the muscle daily.   Yes Historical Provider, MD  diclofenac sodium (VOLTAREN) 1 % GEL Apply 4 g topically 4 (four) times daily. Bilateral knees And 2 gm to right shoulder   Yes Historical Provider, MD  ferrous sulfate 325 (65 FE) MG tablet Take 325 mg by mouth 3 (three) times daily with meals.    Yes Historical Provider, MD  gabapentin (NEURONTIN) 600 MG tablet Take 600 mg by mouth 3 (three) times daily.   Yes Historical Provider, MD  HYDROcodone-acetaminophen (NORCO) 7.5-325 MG tablet Take 1 tablet by mouth every 6 (six) hours as needed for moderate pain.    Yes Historical Provider, MD  Linaclotide Rolan Lipa) 145 MCG CAPS capsule Take 145 mcg by mouth daily. Give 30 minutes prior to first meal.   Yes Historical Provider, MD  loperamide (IMODIUM) 2 MG capsule Take 2 mg by mouth as needed for diarrhea or loose stools.   Yes Historical Provider, MD  magnesium hydroxide (MILK OF MAGNESIA) 400 MG/5ML suspension Take 30 mLs by mouth daily as needed for mild constipation.   Yes Historical Provider, MD  Menthol-Methyl Salicylate (BENGAY GREASELESS EX) Apply 1 application topically 2 (two) times daily as needed. To hands   Yes Historical Provider, MD  methocarbamol (ROBAXIN) 500 MG tablet Take 500 mg by mouth 2 (two) times daily.   Yes Historical Provider, MD  polyethylene glycol (MIRALAX / GLYCOLAX) packet Take 17 g by mouth daily.   Yes Historical Provider, MD  potassium chloride SA (K-DUR,KLOR-CON) 20 MEQ tablet Take 20 mEq by mouth daily.   Yes Historical Provider, MD  ranitidine (ZANTAC) 150 MG tablet Take 150 mg by mouth at bedtime.   Yes Historical Provider, MD  sertraline (ZOLOFT) 25 MG tablet Take 25 mg by mouth daily. Give with 50 mg tab to equal 75 mg   Yes Historical Provider, MD   sertraline (ZOLOFT) 50 MG tablet Take 50 mg by mouth daily. Give with 25 mg tab to equal 75 mg total.   Yes Historical Provider, MD  oseltamivir (TAMIFLU) 75 MG capsule  12/18/15   Historical Provider, MD     Physical Exam: Filed Vitals:   12/18/15 2248 12/18/15 2358 12/19/15 0102 12/19/15 0130  BP:  101/83 157/68 125/75  Pulse:  128 123   Temp: 102.1 F (38.9 C)     TempSrc: Rectal     Resp:  22 22 17   SpO2:  100% 100%      Constitutional: Vital signs reviewed. Elderly female  who appears to be significantly ill and moderate respiratory distress. Patient is lethargic but able to follow very simple Commands. Talks in short sentences unable to understand at this time. head: Normocephalic and atraumatic  Ear: TM normal bilaterally  Mouth: no erythema or exudates, MMM  Eyes: PERRL, EOMI, conjunctivae normal, No scleral icterus.  Neck: Supple, Trachea midline normal ROM, No JVD, mass, thyromegaly, or carotid bruit present.  Cardiovascular: Tachycardic Pulmonary/Chest: Tachypneic without significant use of accessory muscles.  diffuse bilateral wheezes appreciated. Abdominal: Soft. Non-tender, non-distended, bowel sounds are normal, no masses, organomegaly, or guarding present.  GU: no CVA tenderness Musculoskeletal: No joint deformities, erythema, or stiffness, ROM full and no nontender Ext:  +1 pitting edema bilaterally  and no cyanosis, pulses palpable bilaterally (DP and PT)  Hematology: no cervical, inginal, or axillary adenopathy.  Neurological: Lethargic with generalized weakness 4 out of 5  and symmetric bilaterally, cranial nerve II-XII are grossly intact, no focal motor deficit, sensory intact to light touch bilaterally.  Skin: Warm, dry and intact. No rash, cyanosis, or clubbing.  Psychiatric: confused     Data Review   Micro Results No results found for this or any previous visit (from the past 240 hour(s)).  Radiology Reports Dg Chest Port 1 View  12/19/2015   CLINICAL DATA:  Fever, cough, chills EXAM: PORTABLE CHEST 1 VIEW COMPARISON:  11/05/2014 FINDINGS: Chronic cardiomegaly and aortic tortuosity. Pulmonary venous congestion without edema or pneumonia. No effusion or pneumothorax. IMPRESSION: 1. No focal pneumonia. 2. Pulmonary venous congestion. Electronically Signed   By: Monte Fantasia M.D.   On: 12/19/2015 00:46     CBC  Recent Labs Lab 12/18/15 2300  WBC 12.9*  HGB 13.6  HCT 41.8  PLT 204  MCV 93.5  MCH 30.4  MCHC 32.5  RDW 14.9  LYMPHSABS 3.2  MONOABS 1.1*  EOSABS 0.1  BASOSABS 0.0    Chemistries   Recent Labs Lab 12/19/15 0004  NA 138  K 4.6  CL 105  CO2 22  GLUCOSE 124*  BUN 21*  CREATININE 1.05*  CALCIUM 9.0  AST 80*  ALT 25  ALKPHOS 142*  BILITOT 0.7   ------------------------------------------------------------------------------------------------------------------ estimated creatinine clearance is 34.2 mL/min (by C-G formula based on Cr of 1.05). ------------------------------------------------------------------------------------------------------------------ No results for input(s): HGBA1C in the last 72 hours. ------------------------------------------------------------------------------------------------------------------ No results for input(s): CHOL, HDL, LDLCALC, TRIG, CHOLHDL, LDLDIRECT in the last 72 hours. ------------------------------------------------------------------------------------------------------------------ No results for input(s): TSH, T4TOTAL, T3FREE, THYROIDAB in the last 72 hours.  Invalid input(s): FREET3 ------------------------------------------------------------------------------------------------------------------ No results for input(s): VITAMINB12, FOLATE, FERRITIN, TIBC, IRON, RETICCTPCT in the last 72 hours.  Coagulation profile No results for input(s): INR, PROTIME in the last 168 hours.  No results for input(s): DDIMER in the last 72 hours.  Cardiac Enzymes No  results for input(s): CKMB, TROPONINI, MYOGLOBIN in the last 168 hours.  Invalid input(s): CK ------------------------------------------------------------------------------------------------------------------ Invalid input(s): POCBNP   CBG: No results for input(s): GLUCAP in the last 168 hours.     EKG: Independently reviewed. Patient is a sinus rhythm with multiple PACs   Assessment/Plan Sepsis secondary to suspected influenza/HCAP (healthcare-associated pneumonia): Patient was being treated for influenza and had received 1 dose of Tamiflu this morning. On admission heart rate 128, respiratory rate 22, temperature 102.70F, WBC of 12.9, lactic acid 2.06 meeting criteria for SIRS. Suspected to have either a pneumonia and/or influenza. Chest x-ray only showed pulmonary congestion. - Admit to stepdown  - follow-up flu panel, blood cultures, and sputum cultures if patient able  to produce any - continue Tamiflu  for now  - Empiric antibiotics of vancomycin and cefepime per pharmacy   - trend lactic acid levels - Nasal cannula oxygen with continuous pulse oximetry to keep O2 sats greater than 92%   - Xopenex & Ipratropium Nebs scheduled q4 hrs - Budesonide and Brovana nebs scheduled q 12hrs - Tylenol when necessary fever    Pulmonary venous congestion: Patient's last echocardiogram was 10/2014 with EF of 55-60% - Check BNP   - Recheck chest x-ray in a.m. - Check troponin  -  would give IV Lasix if seen to be significantly fluid overloaded   Acute encephalopathy - continue to monitor  - Will need to restart home meds in a.m. when patient more alert   Essential hypertension  - continue Amlodipine  if patient alert enough to take    Code Status:   full Family Communication: bedside Disposition Plan: admit   Total time spent 55 minutes.Greater than 50% of this time was spent in counseling, explanation of diagnosis, planning of further management, and coordination of  care  [facility] Pager [phone]  If 7PM-7AM, please contact night-coverage [email or web address] Password [email or web address] 12/19/2015, 1:37 AM

## 2015-12-19 NOTE — Progress Notes (Signed)
 PROGRESS NOTE    [name]  MRN:[id number]  DOB: 05/06/1925  DOA: 12/18/2015 PCP: Carter, Monica, DO Outpatient Specialists:   Hospital course: 82-year-old female patient, resident of [facility], PMH of HTN, anemia, atrial tachycardia, CVA, chronic pain syndrome, GERD and depression was sent from SNF to ED for complaints of fever, cough and chills that started on day of visit. Apparently multiple residents at SNF sick with flu recently. Patient received a dose of Tamiflu prior to ED arrival. In the ED, patient had a MAXIMUM TEMPERATURE of 102.1F, tachycardic in the 120s-130s, a blood pressure reading of 217/95. Chest x-ray was negative for focal consolidation. Patient was admitted to stepdown unit for sepsis likely secondary to influenza-like illness +/- HCAP.   Assessment & Plan:   Sepsis secondary to influenza A with URTI - Patient met sepsis criteria in the ED on admission. WBC 12.9 and lactate peaked to 2.4 - Chest x-ray without convincing pneumonia. Influenza panel PCR positive for influenza A. - Continue empirically started Tamiflu. Discontinue empirically started broad-spectrum IV antibiotics. - Follow-up outstanding culture results. - Patient received IV fluids in the ED but will not continue due to concern for volume overload clinically and radiologically.  Acute encephalopathy - Secondary to acute illness as above. No focal deficits. Mental status improving. Monitor.  Essential hypertension -Mildly uncontrolled. Continue amlodipine and monitor.  Anemia - Follow CBCs  Atrial tachycardia - Patient had sinus tachycardia in the 120s on arrival which has improved to the low 100s. Precipitated by sepsis. Continue monitoring on telemetry. Not on rate control medications PTA.  Chronic pain syndrome - Resume home dose of Vicodin, gabapentin.  Hypomagnesemia - Replace and follow  Depression - Continue home medications.   GERD - PPI        DVT  prophylaxis: Lovenox Code Status: Full Family Communication: None at bedside Disposition Plan: Continue management in stepdown unit for additional 24 hours. DC back to SNF when medically stable, possibly in the next 48 hours.    Consultants:  None  Procedures:  None  Antimicrobials:  IV Cefepime 3/10>  IV Vancomycin 3/10>  Tamiflu 3/10>   Subjective: Occasional dyspnea. Denies chest pain or cough. "My legs swell up". Complaints of chronic leg pain. As per RN, no acute issues.   Objective: Filed Vitals:   12/19/15 0759 12/19/15 0849 12/19/15 1000 12/19/15 1150  BP:  178/81 180/70   Pulse:  108 101   Temp:      TempSrc:      Resp:  21 31   Height:      Weight:      SpO2: 98% 96% 97% 98%    Intake/Output Summary (Last 24 hours) at 12/19/15 1325 Last data filed at 12/19/15 0900  Gross per 24 hour  Intake   2050 ml  Output      0 ml  Net   2050 ml   Filed Weights   12/19/15 0445  Weight: 72.3 kg (159 lb 6.3 oz)    Exam:  General exam: Moderately built and nourished pleasant elderly female lying comfortably supine in bed.  Respiratory system: slightly harsh breath sounds bilaterally with scattered few medium pitched expiratory rhonchi. No increased work of breathing. Cardiovascular system: S1 & S2 heard, Regular tachycardic. No JVD, murmurs, gallops, clicks or pedal edema. telemetry: Sinus tachycardia in the low 100s.  Gastrointestinal system: Abdomen is nondistended, soft and nontender. Normal bowel sounds heard. Central nervous system: Alert and oriented. No focal neurological deficits. Extremities:   Symmetric 5 x 5 power.   Data Reviewed: Basic Metabolic Panel:  Recent Labs Lab 12/19/15 0004 12/19/15 0238 12/19/15 0623 12/19/15 0713  NA 138  --  QUESTIONABLE RESULTS, RECOMMEND RECOLLECT TO VERIFY 145  K 4.6  --  QUESTIONABLE RESULTS, RECOMMEND RECOLLECT TO VERIFY 3.5  CL 105  --  QUESTIONABLE RESULTS, RECOMMEND RECOLLECT TO VERIFY 113*  CO2 22  --   QUESTIONABLE RESULTS, RECOMMEND RECOLLECT TO VERIFY 21*  GLUCOSE 124*  --  125* 121*  BUN 21*  --  16 16  CREATININE 1.05*  --  0.87 0.85  CALCIUM 9.0  --  QUESTIONABLE RESULTS, RECOMMEND RECOLLECT TO VERIFY 8.5*  MG  --  1.6*  --   --    Liver Function Tests:  Recent Labs Lab 12/19/15 0004 12/19/15 0623 12/19/15 0713  AST 80* 57* 51*  ALT _0 ALKPHOS 142* 108 106  BILITOT 0.7 0.3 0.4  PROT 9.6* 8.2* 8.5*  ALBUMIN 3.3* 2.9* 2.9*   No results for input(s): LIPASE, AMYLASE in the last 168 hours. No results for input(s): AMMONIA in the last 168 hours. CBC:  Recent Labs Lab 12/18/15 2300 12/19/15 0713  WBC 12.9* 15.3*  NEUTROABS 8.6*  --   HGB 13.6 11.4*  HCT 41.8 36.1  MCV 93.5 93.5  PLT 204 184   Cardiac Enzymes:  Recent Labs Lab 12/19/15 0238  TROPONINI 0.06*   BNP (last 3 results) No results for input(s): PROBNP in the last 8760 hours. CBG: No results for input(s): GLUCAP in the last 168 hours.  Recent Results (from the past 240 hour(s))  MRSA PCR Screening     Status: Abnormal   Collection Time: 12/19/15  4:04 AM  Result Value Ref Range Status   MRSA by PCR POSITIVE (A) NEGATIVE Final    Comment:        The GeneXpert MRSA Assay (FDA approved for NASAL specimens only), is one component of a comprehensive MRSA colonization surveillance program. It is not intended to diagnose MRSA infection nor to guide or monitor treatment for MRSA infections. RESULT CALLED TO, READ BACK BY AND VERIFIED WITH: C DENNY AT 0715 ON 03.11.2017 BY NBROOKS          Studies: Dg Chest Port 1 View  12/19/2015  CLINICAL DATA:  Shortness of breath.  Hypertension. EXAM: PORTABLE CHEST - 1 VIEW COMPARISON:  One-view chest 12/18/2015. FINDINGS: The heart is mildly enlarged. Aeration is slightly improved. No focal airspace consolidation is evident. Mild interstitial prominence per cysts. Mark degenerative changes are noted at both shoulders. IMPRESSION: 1. Slightly  improved aeration with persistent interstitial prominence. This is likely in part chronic. 2. No focal airspace consolidation. Electronically Signed   By: San Morelle M.D.   On: 12/19/2015 08:22   Dg Chest Port 1 View  12/19/2015  CLINICAL DATA:  Fever, cough, chills EXAM: PORTABLE CHEST 1 VIEW COMPARISON:  11/05/2014 FINDINGS: Chronic cardiomegaly and aortic tortuosity. Pulmonary venous congestion without edema or pneumonia. No effusion or pneumothorax. IMPRESSION: 1. No focal pneumonia. 2. Pulmonary venous congestion. Electronically Signed   By: Monte Fantasia M.D.   On: 12/19/2015 00:46        Scheduled Meds: . amLODipine  5 mg Oral Daily  . antiseptic oral rinse  7 mL Mouth Rinse BID  . arformoterol  15 mcg Nebulization BID  . budesonide (PULMICORT) nebulizer solution  0.5 mg Nebulization BID  . ceFEPime (MAXIPIME) IV  1 g Intravenous Q24H  . Chlorhexidine Gluconate  Cloth  6 each Topical [id number]  . diclofenac sodium  4 g Topical QID  . enoxaparin (LOVENOX) injection  40 mg Subcutaneous Q24H  . famotidine  20 mg Oral QHS  . ferrous sulfate  325 mg Oral TID WC  . gabapentin  600 mg Oral TID  . guaiFENesin  600 mg Oral BID  . ipratropium  0.5 mg Nebulization QID  . levalbuterol  0.63 mg Nebulization Q4H  . [START ON 12/20/2015] Linaclotide  145 mcg Oral QAC breakfast  . methocarbamol  500 mg Oral BID  . mupirocin ointment  1 application Nasal BID  . oseltamivir  30 mg Oral BID  . pantoprazole (PROTONIX) IV  40 mg Intravenous Q12H  . polyethylene glycol  17 g Oral Daily  . polyvinyl alcohol  1 drop Right Eye TID  . potassium chloride SA  20 mEq Oral Daily  . sertraline  25 mg Oral Daily  . sertraline  50 mg Oral Daily  . sodium chloride flush  3 mL Intravenous Q12H  . vancomycin  1,000 mg Intravenous Q24H   Continuous Infusions:   Principal Problem:   Sepsis ([place]) Active Problems:   Essential hypertension   HCAP (healthcare-associated pneumonia)   Influenza    Pulmonary congestion    Time spent: 40 minutes.    Vernell Leep, MD, FACP, FHM. [facility] Pager [phone]  If 7PM-7AM, please contact night-coverage [email or web address] Password [email or web address] 12/19/2015, 1:25 PM    LOS: 0 days

## 2015-12-19 NOTE — ED Notes (Signed)
Per Abigail Butts in the lab pt has a critical lactic acid of 4.5.  Reported to Autumn, RN who was dropping pt off in stepdown.  Per Autumn, RN pt next RN was informed of value.

## 2015-12-19 NOTE — Progress Notes (Signed)
CRITICAL VALUE ALERT  Critical value received:  Lactic acid 2.4  Date of notification:  12-19-2015  Time of notification:  0832  Critical value read back:  yes  Nurse who received alert:  Jerrel Ivory, RN  MD notified (1st page):  [phone]   Responding MD:  Dr. Margreta Journey  Time MD responded:  [phone]

## 2015-12-19 NOTE — Progress Notes (Signed)
Pharmacy Antibiotic Note  [name] is a 82 y.o. female admitted on 12/18/2015 with sepsis with suspected pneumonia.  Patient presented with c/o fever, cough and chills.  Recently hospitalized for pneumonia.  Pharmacy has been consulted for Vancomycin & Cefepime dosing.  Plan:  Vancomycin 1gm IV q24h  Cefepime 2gm IV x 1 followed by 1gm IV q24h  F/u cultures/sensitivities  Check Vancomycin trough level when appropriate    Temp (24hrs), Avg:100.1 F (37.8 C), Min:98.5 F (36.9 C), Max:102.1 F (38.9 C)   Recent Labs Lab 12/18/15 2300 12/18/15 2313 12/19/15 0004  WBC 12.9*  --   --   CREATININE  --   --  1.05*  LATICACIDVEN  --  2.06*  --     Estimated Creatinine Clearance: 34.2 mL/min (by C-G formula based on Cr of 1.05).    Allergies  Allergen Reactions  . Codeine     Hallucinations  . Ibuprofen     Confusion    Antimicrobials this admission: 3/10 Vanc >>   3/10 Cefepime >>    Dose adjustments this admission:  Goal: Vancomycin Trough : 15-20 mcg/ml  Microbiology results: 3/10 BCx:   3/10 UCx:     Thank you for allowing pharmacy to be a part of this patient's care.  Everette Rank, PharmD 12/19/2015 12:49 AM

## 2015-12-20 LAB — COMPREHENSIVE METABOLIC PANEL
ALBUMIN: 2.9 g/dL — AB (ref 3.5–5.0)
ALK PHOS: 104 U/L (ref 38–126)
ALT: 18 U/L (ref 14–54)
ANION GAP: 10 (ref 5–15)
AST: 49 U/L — AB (ref 15–41)
BILIRUBIN TOTAL: 0.6 mg/dL (ref 0.3–1.2)
BUN: 14 mg/dL (ref 6–20)
CALCIUM: 8.4 mg/dL — AB (ref 8.9–10.3)
CO2: 23 mmol/L (ref 22–32)
Chloride: 111 mmol/L (ref 101–111)
Creatinine, Ser: 0.83 mg/dL (ref 0.44–1.00)
GFR calc Af Amer: >60 mL/min (ref >60)
GFR calc non Af Amer: >60 mL/min (ref >60)
GLUCOSE: 94 mg/dL (ref 65–99)
POTASSIUM: 3.8 mmol/L (ref 3.5–5.1)
SODIUM: 144 mmol/L (ref 135–145)
TOTAL PROTEIN: 8.1 g/dL (ref 6.5–8.1)

## 2015-12-20 LAB — CBC
HEMATOCRIT: 33.6 % — AB (ref 36.0–46.0)
HEMOGLOBIN: 11.2 g/dL — AB (ref 12.0–15.0)
MCH: 29.7 pg (ref 26.0–34.0)
MCHC: 33.3 g/dL (ref 30.0–36.0)
MCV: 89.1 fL (ref 78.0–100.0)
Platelets: 169 10*3/uL (ref 150–400)
RBC: 3.77 MIL/uL — ABNORMAL LOW (ref 3.87–5.11)
RDW: 15.2 % (ref 11.5–15.5)
WBC: 19 10*3/uL — AB (ref 4.0–10.5)

## 2015-12-20 LAB — C DIFFICILE QUICK SCREEN W PCR REFLEX
C DIFFICILE (CDIFF) TOXIN: NEGATIVE
C DIFFICLE (CDIFF) ANTIGEN: NEGATIVE
C Diff interpretation: NEGATIVE

## 2015-12-20 LAB — LACTIC ACID, PLASMA: Lactic Acid, Venous: 0.8 mmol/L (ref 0.5–2.0)

## 2015-12-20 LAB — URINE CULTURE

## 2015-12-20 LAB — MAGNESIUM: Magnesium: 1.8 mg/dL (ref 1.7–2.4)

## 2015-12-20 MED ORDER — LOPERAMIDE HCL 2 MG PO CAPS
2.0000 mg | ORAL_CAPSULE | Freq: Once | ORAL | Status: AC
  Administered 2015-12-20: 2 mg via ORAL
  Filled 2015-12-20: qty 1

## 2015-12-20 NOTE — Evaluation (Signed)
Physical Therapy Evaluation-1x Patient Details Name: [name] MRN: [id number] DOB: 04/25/1925 Today's Date: 12/20/2015   History of Present Illness  82 yo female admitted with sepsis, +flu. Hx of HTN, T11-T12 laminotomy-2014, CVA, spinal stenosis, atrial tachycardia, chronic pain, osteoporosis, renal insufficiency.  Clinical Impression  On eval, pt required Mod assist for bed mobility. Pt sat EOB ~5 minutes with Min guard assist. Pt fatigued easily with minimal activity. Assisted back to supine. Recommend return to SNF.     Follow Up Recommendations SNF    Equipment Recommendations       Recommendations for Other Services       Precautions / Restrictions Precautions Precautions: Fall Precaution Comments: droplet Restrictions Weight Bearing Restrictions: No      Mobility  Bed Mobility Overal bed mobility: Needs Assistance Bed Mobility: Supine to Sit;Sit to Supine     Supine to sit: Mod assist Sit to supine: Mod assist   General bed mobility comments: Assist for trunk and bil LEs. Increased time. Pt fatigued easily with task.   Transfers                 General transfer comment: NT  Ambulation/Gait                Stairs            Wheelchair Mobility    Modified Rankin (Stroke Patients Only)       Balance Overall balance assessment: Needs assistance Sitting-balance support: Bilateral upper extremity supported;Feet supported   Sitting balance - Comments: Sat EOB ~5 minutes with Min guard assist.                                      Pertinent Vitals/Pain Pain Assessment: Faces Faces Pain Scale: Hurts even more Pain Location: L LE Pain Descriptors / Indicators: Sore;Aching Pain Intervention(s): Monitored during session;Repositioned;Limited activity within patient's tolerance    Home Living Family/patient expects to be discharged to:: Skilled nursing facility                      Prior Function Level of  Independence: Needs assistance   Gait / Transfers Assistance Needed: Max assist for transfers only (pt is nonambulatory)           Hand Dominance        Extremity/Trunk Assessment   Upper Extremity Assessment: Generalized weakness           Lower Extremity Assessment: Generalized weakness      Cervical / Trunk Assessment: Kyphotic  Communication   Communication: No difficulties  Cognition Arousal/Alertness: Awake/alert Behavior During Therapy: WFL for tasks assessed/performed Overall Cognitive Status: Within Functional Limits for tasks assessed                      General Comments      Exercises General Exercises - Lower Extremity Long Arc Quad: AROM;Both;5 reps;Seated      Assessment/Plan    PT Assessment All further PT needs can be met in the next venue of care (return to SNF)  PT Diagnosis Generalized weakness   PT Problem List    PT Treatment Interventions     PT Goals (Current goals can be found in the Care Plan section) Acute Rehab PT Goals Patient Stated Goal: none statd PT Goal Formulation: All assessment and education complete, DC therapy    Frequency       Barriers to discharge        Co-evaluation               End of Session   Activity Tolerance: Patient limited by fatigue Patient left: in bed;with call bell/phone within reach;with bed alarm set           Time: 9450-3888 PT Time Calculation (min) (ACUTE ONLY): 14 min   Charges:   PT Evaluation $PT Eval Low Complexity: 1 Procedure     PT G Codes:        Weston Anna, MPT Pager: [phone]

## 2015-12-20 NOTE — Progress Notes (Signed)
PROGRESS NOTE    [name]  SJG:[id number]  DOB: 1924/12/22  DOA: 12/18/2015 PCP: [name], DO Outpatient Specialists:   Hospital course: 82 year old female patient, resident of [name], PMH of HTN, anemia, atrial tachycardia, CVA, chronic pain syndrome, GERD and depression was sent from SNF to ED for complaints of fever, cough and chills that started on day of visit. Apparently multiple residents at SNF sick with flu recently. Patient received a dose of Tamiflu prior to ED arrival. In the ED, patient had a MAXIMUM TEMPERATURE of 102.21F, tachycardic in the 120s-130s, a blood pressure reading of 217/95. Chest x-ray was negative for focal consolidation. Patient was admitted to stepdown unit for sepsis likely secondary to influenza A related URTI. No pneumonia clinically or radiologically. Improved. Transfer to medical bed 3/12 and possible discharge to [facility] 3/13.   Assessment & Plan:   Sepsis secondary to influenza A with URTI - Patient met sepsis criteria in the ED on admission. WBC 12.9 and lactate peaked to 2.4 - Chest x-ray without convincing pneumonia. Influenza panel PCR positive for influenza A. - Continue empirically started Tamiflu. Discontinued empirically started broad-spectrum IV antibiotics. - Blood cultures 2: Negative to date. - Improved clinically.  Acute encephalopathy - Secondary to acute illness as above. No focal deficits. Mental status improved and probably back to baseline.  Essential hypertension -Mildly uncontrolled. Continue amlodipine and monitor.  Anemia - Stable.  Atrial tachycardia - Patient had sinus tachycardia in the 120s precipitated by sepsis.  Not on rate control medications PTA. - Resolved.  Chronic pain syndrome - Resume home dose of Vicodin, gabapentin.  Hypomagnesemia - Replaced  Depression - Continue home medications.   GERD - Continue home dose of H2 blockers.     DVT prophylaxis: Lovenox Code Status:  Full Family Communication: None at bedside Disposition Plan: initially admitted to stepdown unit. Transfer to medical bed 3/12 and possible discharge to [facility] 3/13.    Consultants:  None  Procedures:  None  Antimicrobials:  IV Cefepime 3/10>3/11   IV Vancomycin 3/10>3/11   Tamiflu 3/10>   Subjective: Chronic leg pain. Denies dyspnea, cough or chest pain. As per RN, no acute issues.   Objective: Filed Vitals:   12/20/15 0600 12/20/15 0700 12/20/15 0754 12/20/15 0936  BP: 158/64     Pulse: 81 84    Temp:   100 F (37.8 C)   TempSrc:   Oral   Resp: 17 24    Height:      Weight:      SpO2: 98% 98%  98%    Intake/Output Summary (Last 24 hours) at 12/20/15 1100 Last data filed at 12/20/15 0600  Gross per 24 hour  Intake    590 ml  Output      0 ml  Net    590 ml   Filed Weights   12/19/15 0445  Weight: 72.3 kg (159 lb 6.3 oz)    Exam:  General exam: Moderately built and nourished pleasant elderly female lying comfortably supine in bed.  Respiratory system: Much improved breath sounds bilaterally and clear to auscultation. No increased work of breathing. Cardiovascular system: S1 & S2 heard, RRR. No JVD, murmurs, gallops, clicks or pedal edema. Telemetry: Sinus rhythm with occasional PVCs .  Gastrointestinal system: Abdomen is nondistended, soft and nontender. Normal bowel sounds heard. Central nervous system: Alert and oriented. No focal neurological deficits. Extremities: Symmetric 5 x 5 power.   Data Reviewed: Basic Metabolic Panel:  Recent Labs Lab  12/19/15 0004 12/19/15 0238 12/19/15 0623 12/19/15 0713 12/20/15 0415  NA 138  --  QUESTIONABLE RESULTS, RECOMMEND RECOLLECT TO VERIFY 145 144  K 4.6  --  QUESTIONABLE RESULTS, RECOMMEND RECOLLECT TO VERIFY 3.5 3.8  CL 105  --  QUESTIONABLE RESULTS, RECOMMEND RECOLLECT TO VERIFY 113* 111  CO2 22  --  QUESTIONABLE RESULTS, RECOMMEND RECOLLECT TO VERIFY 21* 23  GLUCOSE 124*  --  125* 121* 94  BUN 21*  --   16 16 14   CREATININE 1.05*  --  0.87 0.85 0.83  CALCIUM 9.0  --  QUESTIONABLE RESULTS, RECOMMEND RECOLLECT TO VERIFY 8.5* 8.4*  MG  --  1.6*  --   --  1.8   Liver Function Tests:  Recent Labs Lab 12/19/15 0004 12/19/15 0623 12/19/15 0713 12/20/15 0415  AST 80* 57* 51* 49*  ALT 25 21 20 18   ALKPHOS 142* 108 106 104  BILITOT 0.7 0.3 0.4 0.6  PROT 9.6* 8.2* 8.5* 8.1  ALBUMIN 3.3* 2.9* 2.9* 2.9*   No results for input(s): LIPASE, AMYLASE in the last 168 hours. No results for input(s): AMMONIA in the last 168 hours. CBC:  Recent Labs Lab 12/18/15 2300 12/19/15 0713 12/20/15 0415  WBC 12.9* 15.3* 19.0*  NEUTROABS 8.6*  --   --   HGB 13.6 11.4* 11.2*  HCT 41.8 36.1 33.6*  MCV 93.5 93.5 89.1  PLT 204 184 169   Cardiac Enzymes:  Recent Labs Lab 12/19/15 0238  TROPONINI 0.06*   BNP (last 3 results) No results for input(s): PROBNP in the last 8760 hours. CBG: No results for input(s): GLUCAP in the last 168 hours.  Recent Results (from the past 240 hour(s))  Blood Culture (routine x 2)     Status: None (Preliminary result)   Collection Time: 12/18/15 11:00 PM  Result Value Ref Range Status   Specimen Description BLOOD LEFT HAND  Final   Special Requests BOTTLES DRAWN AEROBIC AND ANAEROBIC 5CC  Final   Culture   Final    NO GROWTH < 24 HOURS Performed at [facility]    Report Status PENDING  Incomplete  Blood Culture (routine x 2)     Status: None (Preliminary result)   Collection Time: 12/18/15 11:30 PM  Result Value Ref Range Status   Specimen Description BLOOD RIGHT HAND  Final   Special Requests BOTTLES DRAWN AEROBIC AND ANAEROBIC 5CC  Final   Culture   Final    NO GROWTH < 24 HOURS Performed at [facility]    Report Status PENDING  Incomplete  MRSA PCR Screening     Status: Abnormal   Collection Time: 12/19/15  4:04 AM  Result Value Ref Range Status   MRSA by PCR POSITIVE (A) NEGATIVE Final    Comment:        The GeneXpert MRSA Assay  (FDA approved for NASAL specimens only), is one component of a comprehensive MRSA colonization surveillance program. It is not intended to diagnose MRSA infection nor to guide or monitor treatment for MRSA infections. RESULT CALLED TO, READ BACK BY AND VERIFIED WITH: C DENNY AT 0715 ON 03.11.2017 BY NBROOKS          Studies: Dg Chest Port 1 View  12/19/2015  CLINICAL DATA:  Shortness of breath.  Hypertension. EXAM: PORTABLE CHEST - 1 VIEW COMPARISON:  One-view chest 12/18/2015. FINDINGS: The heart is mildly enlarged. Aeration is slightly improved. No focal airspace consolidation is evident. Mild interstitial prominence per cysts. Mark degenerative changes  are noted at both shoulders. IMPRESSION: 1. Slightly improved aeration with persistent interstitial prominence. This is likely in part chronic. 2. No focal airspace consolidation. Electronically Signed   By: San Morelle M.D.   On: 12/19/2015 08:22   Dg Chest Port 1 View  12/19/2015  CLINICAL DATA:  Fever, cough, chills EXAM: PORTABLE CHEST 1 VIEW COMPARISON:  11/05/2014 FINDINGS: Chronic cardiomegaly and aortic tortuosity. Pulmonary venous congestion without edema or pneumonia. No effusion or pneumothorax. IMPRESSION: 1. No focal pneumonia. 2. Pulmonary venous congestion. Electronically Signed   By: Monte Fantasia M.D.   On: 12/19/2015 00:46        Scheduled Meds: . amLODipine  5 mg Oral Daily  . antiseptic oral rinse  7 mL Mouth Rinse BID  . arformoterol  15 mcg Nebulization BID  . budesonide (PULMICORT) nebulizer solution  0.5 mg Nebulization BID  . Chlorhexidine Gluconate Cloth  6 each Topical Q0600  . diclofenac sodium  4 g Topical QID  . enoxaparin (LOVENOX) injection  40 mg Subcutaneous Q24H  . famotidine  20 mg Oral QHS  . ferrous sulfate  325 mg Oral TID WC  . gabapentin  600 mg Oral TID  . guaiFENesin  600 mg Oral BID  . ipratropium  0.5 mg Nebulization QID  . levalbuterol  0.63 mg Nebulization QID  .  Linaclotide  145 mcg Oral QAC breakfast  . methocarbamol  500 mg Oral BID  . mupirocin ointment  1 application Nasal BID  . oseltamivir  30 mg Oral BID  . polyethylene glycol  17 g Oral Daily  . polyvinyl alcohol  1 drop Right Eye TID  . potassium chloride SA  20 mEq Oral Daily  . sertraline  25 mg Oral Daily  . sertraline  50 mg Oral Daily  . sodium chloride flush  3 mL Intravenous Q12H   Continuous Infusions:   Principal Problem:   Sepsis ([place]) Active Problems:   Essential hypertension   HCAP (healthcare-associated pneumonia)   Influenza   Pulmonary congestion    Time spent: 20 minutes.    Vernell Leep, MD, FACP, FHM. [facility] Pager [phone]  If 7PM-7AM, please contact night-coverage [email or web address] Password [email or web address] 12/20/2015, 11:00 AM    LOS: 1 day

## 2015-12-21 DIAGNOSIS — L8990 Pressure ulcer of unspecified site, unspecified stage: Secondary | ICD-10-CM | POA: Insufficient documentation

## 2015-12-21 LAB — CBC
HCT: 36.8 % (ref 36.0–46.0)
Hemoglobin: 11.8 g/dL — ABNORMAL LOW (ref 12.0–15.0)
MCH: 29.7 pg (ref 26.0–34.0)
MCHC: 32.1 g/dL (ref 30.0–36.0)
MCV: 92.7 fL (ref 78.0–100.0)
PLATELETS: 172 10*3/uL (ref 150–400)
RBC: 3.97 MIL/uL (ref 3.87–5.11)
RDW: 15.1 % (ref 11.5–15.5)
WBC: 13.5 10*3/uL — AB (ref 4.0–10.5)

## 2015-12-21 MED ORDER — OSELTAMIVIR PHOSPHATE 30 MG PO CAPS: 30.0000 mg | ORAL_CAPSULE | Freq: Two times a day (BID) | ORAL | Status: DC

## 2015-12-21 NOTE — Progress Notes (Signed)
Report called and given to the nurse at [facility] Neta Mends RN 3:46 PM 12-21-2015

## 2015-12-21 NOTE — Progress Notes (Signed)
Patient is set to discharge back to [facility] today. Patient & son, [name] are aware. Discharge packet placed with shadow chart - RN, Kennitrish aware. [facility] called for transport.     Raynaldo Opitz, [facility] Clinical Social Worker cell #: [phone]

## 2015-12-21 NOTE — Care Management Note (Signed)
Case Management Note  Patient Details  Name: [name] MRN: [id number] Date of Birth: November 06, 1924  Subjective/Objective:      Admitted with Sepsis secondary to suspected influenza/HCAP               Action/Plan: Discharge planning per CSW  Expected Discharge Date:                  Expected Discharge Plan:  Skilled Nursing Facility  In-House Referral:  Clinical Social Work  Discharge planning Services  CM Consult  Post Acute Care Choice:  NA Choice offered to:  NA  DME Arranged:  N/A DME Agency:  NA  HH Arranged:  NA HH Agency:  NA  Status of Service:  Completed, signed off  Medicare Important Message Given:    Date Medicare IM Given:    Medicare IM give by:    Date Additional Medicare IM Given:    Additional Medicare Important Message give by:     If discussed at [place] of Stay Meetings, dates discussed:    Additional Comments:  Guadalupe Maple, RN 12/21/2015, 12:27 PM [phone]

## 2015-12-21 NOTE — Discharge Summary (Signed)
Physician Discharge Summary  [name]  TOI:[id number]  DOB: 21-Jun-1925  DOA: 12/18/2015  PCP: [name], DO  Admit date: 12/18/2015 Discharge date: 12/21/2015  Time spent: Greater than 30 minutes  Recommendations for Outpatient Follow-up:  1. M.D. at SNF in 3 days with repeat labs (CBC & BMP). Please follow up final blood cultures that were sent from the hospital.  Discharge Diagnoses:  Principal Problem:   Sepsis ([place]) Active Problems:   Essential hypertension   HCAP (healthcare-associated pneumonia)   Influenza   Pulmonary congestion   Pressure ulcer   Discharge Condition: Improved & Stable  Diet recommendation: Heart healthy diet.  Filed Weights   12/19/15 0445  Weight: 72.3 kg (159 lb 6.3 oz)    History of present illness:  82 year old female patient, resident of [facility], PMH of HTN, anemia, atrial tachycardia, CVA, chronic pain syndrome, GERD and depression was sent from SNF to ED for complaints of fever, cough and chills that started on day of visit. Apparently multiple residents at SNF sick with flu recently. Patient received a dose of Tamiflu prior to ED arrival. In the ED, patient had a MAXIMUM TEMPERATURE of 102.61F, tachycardic in the 120s-130s, a blood pressure reading of 217/95. Chest x-ray was negative for focal consolidation. Patient was admitted to stepdown unit for sepsis likely secondary to influenza A related URTI. No pneumonia clinically or radiologically. Improved.  Hospital Course:    Sepsis secondary to influenza A with URTI - Patient met sepsis criteria in the ED on admission. WBC 12.9 and lactate peaked to 2.4 - Chest x-ray without convincing pneumonia. Influenza panel PCR positive for influenza A. - Continue empirically started Tamiflu. Discontinued empirically started broad-spectrum IV antibiotics. - Blood cultures 2: Negative to date. - Improved clinically. - Completed total 5 days of Tamiflu-dose adjusted to renal functions.  Asymptomatic.  Acute encephalopathy - Secondary to acute illness as above. No focal deficits. Resolved.  Essential hypertension -Mildly uncontrolled. Continue amlodipine and monitor. May need outpatient medication adjustment.  Anemia - Stable.  Atrial tachycardia - Patient had sinus tachycardia in the 120s precipitated by sepsis. Not on rate control medications PTA. - Resolved.  Chronic pain syndrome - Resume home dose of Vicodin, gabapentin.  Hypomagnesemia - Replaced  Depression - Continue home medications.   GERD - Continue home dose of H2 blockers.   Self-limiting diarrhea - Patient had 4 episodes of watery diarrhea on 3/12. C. difficile testing was negative. No BM since yesterday. She may have history of chronic intermittent diarrhea.    Discussed with patient's son Mr. Army Melia. Updated care and answered questions.    Consultants:  None  Procedures:  None  Antimicrobials:  IV Cefepime 3/10>3/11   IV Vancomycin 3/10>3/11   Tamiflu 3/10> 12/23/15.   Discharge Exam:  Complaints:  Denies complaints. No BM since yesterday. No dyspnea, cough, chest pain or fevers. Chronic leg pains. As per RN, no acute issues.  Filed Vitals:   12/20/15 2141 12/21/15 0426 12/21/15 0955 12/21/15 1323  BP:  159/72  139/78  Pulse:  76  81  Temp:  98.9 F (37.2 C)  98.7 F (37.1 C)  TempSrc:  Oral  Oral  Resp:  18  18  Height:      Weight:      SpO2: 96% 97% 98% 99%    General exam: Moderately built and nourished pleasant elderly female lying comfortably supine in bed.  Respiratory system: clear to auscultation. No increased work of breathing. Cardiovascular system:  S1 & S2 heard, RRR. No JVD, murmurs, gallops, clicks or pedal edema.  Gastrointestinal system: Abdomen is nondistended, soft and nontender. Normal bowel sounds heard. Central nervous system: Alert and oriented. No focal neurological deficits. Extremities: Symmetric 5 x 5 power.  Discharge  Instructions      Discharge Instructions    Call MD for:  difficulty breathing, headache or visual disturbances    Complete by:  As directed      Call MD for:  extreme fatigue    Complete by:  As directed      Call MD for:  hives    Complete by:  As directed      Call MD for:  persistant dizziness or light-headedness    Complete by:  As directed      Call MD for:  persistant nausea and vomiting    Complete by:  As directed      Call MD for:  severe uncontrolled pain    Complete by:  As directed      Call MD for:  temperature >100.4    Complete by:  As directed      Diet - low sodium heart healthy    Complete by:  As directed      Increase activity slowly    Complete by:  As directed             Medication List    STOP taking these medications        ROCEPHIN IJ      TAKE these medications        amLODipine 5 MG tablet  Commonly known as:  NORVASC  Take 1 tablet (5 mg total) by mouth daily.     BENGAY GREASELESS EX  Apply 1 application topically 2 (two) times daily as needed. To hands     CALCIUM PO  Take 600 mg by mouth 2 (two) times daily.     carboxymethylcellulose 0.5 % Soln  Commonly known as:  REFRESH PLUS  Place 1 drop into the right eye 3 (three) times daily.     diclofenac sodium 1 % Gel  Commonly known as:  VOLTAREN  Apply 4 g topically 4 (four) times daily. Bilateral knees And 2 gm to right shoulder     ferrous sulfate 325 (65 FE) MG tablet  Take 325 mg by mouth 3 (three) times daily with meals.     gabapentin 600 MG tablet  Commonly known as:  NEURONTIN  Take 600 mg by mouth 3 (three) times daily.     HYDROcodone-acetaminophen 7.5-325 MG tablet  Commonly known as:  NORCO  Take 1 tablet by mouth every 6 (six) hours as needed for moderate pain.     LINZESS 145 MCG Caps capsule  Generic drug:  Linaclotide  Take 145 mcg by mouth daily. Give 30 minutes prior to first meal.     loperamide 2 MG capsule  Commonly known as:  IMODIUM  Take 2 mg  by mouth as needed for diarrhea or loose stools.     magnesium hydroxide 400 MG/5ML suspension  Commonly known as:  MILK OF MAGNESIA  Take 30 mLs by mouth daily as needed for mild constipation.     methocarbamol 500 MG tablet  Commonly known as:  ROBAXIN  Take 500 mg by mouth 2 (two) times daily.     oseltamivir 30 MG capsule  Commonly known as:  TAMIFLU  Take 1 capsule (30 mg total) by mouth 2 (two) times daily. Discontinue  after 12/23/15 doses.     polyethylene glycol packet  Commonly known as:  MIRALAX / GLYCOLAX  Take 17 g by mouth daily.     potassium chloride SA 20 MEQ tablet  Commonly known as:  K-DUR,KLOR-CON  Take 20 mEq by mouth daily.     ranitidine 150 MG tablet  Commonly known as:  ZANTAC  Take 150 mg by mouth at bedtime.     sertraline 25 MG tablet  Commonly known as:  ZOLOFT  Take 25 mg by mouth daily. Give with 50 mg tab to equal 75 mg     sertraline 50 MG tablet  Commonly known as:  ZOLOFT  Take 50 mg by mouth daily. Give with 25 mg tab to equal 75 mg total.         Get Medicines reviewed and adjusted: Please take all your medications with you for your next visit with your Primary MD  Please request your Primary MD to go over all hospital tests and procedure/radiological results at the follow up. Please ask your Primary MD to get all Hospital records sent to his/her office.  If you experience worsening of your admission symptoms, develop shortness of breath, life threatening emergency, suicidal or homicidal thoughts you must seek medical attention immediately by calling 911 or calling your MD immediately if symptoms less severe.  You must read complete instructions/literature along with all the possible adverse reactions/side effects for all the Medicines you take and that have been prescribed to you. Take any new Medicines after you have completely understood and accept all the possible adverse reactions/side effects.   Do not drive when taking pain  medications.   Do not take more than prescribed Pain, Sleep and Anxiety Medications  Special Instructions: If you have smoked or chewed Tobacco in the last 2 yrs please stop smoking, stop any regular Alcohol and or any Recreational drug use.  Wear Seat belts while driving.  Please note  You were cared for by a hospitalist during your hospital stay. Once you are discharged, your primary care physician will handle any further medical issues. Please note that NO REFILLS for any discharge medications will be authorized once you are discharged, as it is imperative that you return to your primary care physician (or establish a relationship with a primary care physician if you do not have one) for your aftercare needs so that they can reassess your need for medications and monitor your lab values.    The results of significant diagnostics from this hospitalization (including imaging, microbiology, ancillary and laboratory) are listed below for reference.    Significant Diagnostic Studies: Dg Chest Port 1 View  12/19/2015  CLINICAL DATA:  Shortness of breath.  Hypertension. EXAM: PORTABLE CHEST - 1 VIEW COMPARISON:  One-view chest 12/18/2015. FINDINGS: The heart is mildly enlarged. Aeration is slightly improved. No focal airspace consolidation is evident. Mild interstitial prominence per cysts. Mark degenerative changes are noted at both shoulders. IMPRESSION: 1. Slightly improved aeration with persistent interstitial prominence. This is likely in part chronic. 2. No focal airspace consolidation. Electronically Signed   By: San Morelle M.D.   On: 12/19/2015 08:22   Dg Chest Port 1 View  12/19/2015  CLINICAL DATA:  Fever, cough, chills EXAM: PORTABLE CHEST 1 VIEW COMPARISON:  11/05/2014 FINDINGS: Chronic cardiomegaly and aortic tortuosity. Pulmonary venous congestion without edema or pneumonia. No effusion or pneumothorax. IMPRESSION: 1. No focal pneumonia. 2. Pulmonary venous congestion.  Electronically Signed   By: Neva Seat.D.  On: 12/19/2015 00:46    Microbiology: Recent Results (from the past 240 hour(s))  Urine culture     Status: None   Collection Time: 12/18/15 10:48 PM  Result Value Ref Range Status   Specimen Description URINE, RANDOM  Final   Special Requests NONE  Final   Culture   Final    MULTIPLE SPECIES PRESENT, SUGGEST RECOLLECTION Performed at [facility]    Report Status 12/20/2015 FINAL  Final  Blood Culture (routine x 2)     Status: None (Preliminary result)   Collection Time: 12/18/15 11:00 PM  Result Value Ref Range Status   Specimen Description BLOOD LEFT HAND  Final   Special Requests BOTTLES DRAWN AEROBIC AND ANAEROBIC 5CC  Final   Culture   Final    NO GROWTH 2 DAYS Performed at [facility]    Report Status PENDING  Incomplete  Blood Culture (routine x 2)     Status: None (Preliminary result)   Collection Time: 12/18/15 11:30 PM  Result Value Ref Range Status   Specimen Description BLOOD RIGHT HAND  Final   Special Requests BOTTLES DRAWN AEROBIC AND ANAEROBIC 5CC  Final   Culture   Final    NO GROWTH 2 DAYS Performed at [facility]    Report Status PENDING  Incomplete  MRSA PCR Screening     Status: Abnormal   Collection Time: 12/19/15  4:04 AM  Result Value Ref Range Status   MRSA by PCR POSITIVE (A) NEGATIVE Final    Comment:        The GeneXpert MRSA Assay (FDA approved for NASAL specimens only), is one component of a comprehensive MRSA colonization surveillance program. It is not intended to diagnose MRSA infection nor to guide or monitor treatment for MRSA infections. RESULT CALLED TO, READ BACK BY AND VERIFIED WITH: C DENNY AT 0715 ON 03.11.2017 BY NBROOKS   C difficile quick scan w PCR reflex     Status: None   Collection Time: 12/20/15  3:27 PM  Result Value Ref Range Status   C Diff antigen NEGATIVE NEGATIVE Final   C Diff toxin NEGATIVE NEGATIVE Final   C Diff interpretation  Negative for toxigenic C. difficile  Final     Labs: Basic Metabolic Panel:  Recent Labs Lab 12/19/15 0004 12/19/15 0238 12/19/15 0623 12/19/15 0713 12/20/15 0415  NA 138  --  QUESTIONABLE RESULTS, RECOMMEND RECOLLECT TO VERIFY 145 144  K 4.6  --  QUESTIONABLE RESULTS, RECOMMEND RECOLLECT TO VERIFY 3.5 3.8  CL 105  --  QUESTIONABLE RESULTS, RECOMMEND RECOLLECT TO VERIFY 113* 111  CO2 22  --  QUESTIONABLE RESULTS, RECOMMEND RECOLLECT TO VERIFY 21* 23  GLUCOSE 124*  --  125* 121* 94  BUN 21*  --  16 16 14   CREATININE 1.05*  --  0.87 0.85 0.83  CALCIUM 9.0  --  QUESTIONABLE RESULTS, RECOMMEND RECOLLECT TO VERIFY 8.5* 8.4*  MG  --  1.6*  --   --  1.8   Liver Function Tests:  Recent Labs Lab 12/19/15 0004 12/19/15 0623 12/19/15 0713 12/20/15 0415  AST 80* 57* 51* 49*  ALT 25 21 20 18   ALKPHOS 142* 108 106 104  BILITOT 0.7 0.3 0.4 0.6  PROT 9.6* 8.2* 8.5* 8.1  ALBUMIN 3.3* 2.9* 2.9* 2.9*   No results for input(s): LIPASE, AMYLASE in the last 168 hours. No results for input(s): AMMONIA in the last 168 hours. CBC:  Recent Labs Lab 12/18/15 2300 12/19/15 5003  12/20/15 0415 12/21/15 0406  WBC 12.9* 15.3* 19.0* 13.5*  NEUTROABS 8.6*  --   --   --   HGB 13.6 11.4* 11.2* 11.8*  HCT 41.8 36.1 33.6* 36.8  MCV 93.5 93.5 89.1 92.7  PLT 204 184 169 172   Cardiac Enzymes:  Recent Labs Lab 12/19/15 0238  TROPONINI 0.06*   BNP: BNP (last 3 results)  Recent Labs  12/19/15 0238  BNP 209.3*    ProBNP (last 3 results) No results for input(s): PROBNP in the last 8760 hours.  CBG: No results for input(s): GLUCAP in the last 168 hours.     Signed:  Vernell Leep, MD, FACP, FHM. [facility] Pager [phone]  If 7PM-7AM, please contact night-coverage [email or web address] Password [email or web address] 12/21/2015, 1:27 PM

## 2015-12-21 NOTE — NC FL2 (Signed)
[place] LEVEL OF CARE SCREENING TOOL     IDENTIFICATION  Patient Name: [name] Birthdate: Jun 15, 1925 Sex: female Admission Date (Current Location): 12/18/2015  [facility] [place] Number:  Herbalist and Address:  [facility],  [place] [address] [place]      Provider Number: [id number]  Attending Physician Name and Address:  Modena Jansky, MD  Relative Name and Phone Number:       Current Level of Care: Hospital Recommended Level of Care: [place] Prior Approval Number:    Date Approved/Denied:   PASRR Number: [id number]  Discharge Plan: SNF    Current Diagnoses: Patient Active Problem List   Diagnosis Date Noted  . Pressure ulcer 12/21/2015  . HCAP (healthcare-associated pneumonia) 12/19/2015  . Sepsis ([place]) 12/19/2015  . Influenza 12/19/2015  . Pulmonary congestion 12/19/2015  . History of stroke 06/02/2015  . Edema 05/09/2014  . Glaucoma 11/30/2013  . Peripheral neuropathy ([place]) 10/25/2013  . Constipation 10/25/2013  . Spinal cord compression ([place]) 08/01/2013  . CVA (cerebral infarction) 04/09/2013  . Osteoporosis, unspecified 04/09/2013  . Chronic pain syndrome 04/09/2013  . Celiac disease 08/17/2011  . GERD (gastroesophageal reflux disease) 03/21/2011  . Depression 12/07/2009  . Essential hypertension 05/15/2007  . Anemia 03/13/2007    Orientation RESPIRATION BLADDER Height & Weight     Self  Normal Continent Weight: 159 lb 6.3 oz (72.3 kg) Height:  5\' 2"  (157.5 cm)  BEHAVIORAL SYMPTOMS/MOOD NEUROLOGICAL BOWEL NUTRITION STATUS      Continent Diet (Heart)  AMBULATORY STATUS COMMUNICATION OF NEEDS Skin   Extensive Assist Verbally PU Stage and Appropriate Care PU Stage 1 Dressing:   (PressureUlcer03/12/17StageI-Intactskinwithnon-blanchablerednessofalocalizedareausuallyoverabonyprominence.smallpinkareaonsacrumw/surroundingscartissue&2smallwhite.crustedareaoneitherside-skin)                     Personal Care Assistance Level of Assistance  Bathing, Feeding, Dressing Bathing Assistance: Limited assistance Feeding assistance: Limited assistance Dressing Assistance: Limited assistance     Functional Limitations Info             SPECIAL CARE FACTORS FREQUENCY                       Contractures      Additional Factors Info  Isolation Precautions, Code Status, Allergies Code Status Info: Fullcode Allergies Info: Codeine, Ibuprofen     Isolation Precautions Info: Droplet - positive for Flu     Current Medications (12/21/2015):  This is the current hospital active medication list Current Facility-Administered Medications  Medication Dose Route Frequency Provider Last Rate Last Dose  . acetaminophen (TYLENOL) tablet 650 mg  650 mg Oral Q6H PRN Modena Jansky, MD   650 mg at 12/19/15 2022  . amLODipine (NORVASC) tablet 5 mg  5 mg Oral Daily Norval Morton, MD   5 mg at 12/21/15 0920  . antiseptic oral rinse (CPC / CETYLPYRIDINIUM CHLORIDE 0.05%) solution 7 mL  7 mL Mouth Rinse BID Modena Jansky, MD   7 mL at 12/21/15 0920  . arformoterol (BROVANA) nebulizer solution 15 mcg  15 mcg Nebulization BID Norval Morton, MD   15 mcg at 12/21/15 1003  . budesonide (PULMICORT) nebulizer solution 0.5 mg  0.5 mg Nebulization BID Norval Morton, MD   0.5 mg at 12/21/15 1008  . Chlorhexidine Gluconate Cloth 2 % PADS 6 each  6 each Topical Q0600 Norval Morton, MD   6 each at 12/20/15 1000  . diclofenac sodium (VOLTAREN)  1 % transdermal gel 4 g  4 g Topical QID Modena Jansky, MD   4 g at 12/21/15 0920  . enoxaparin (LOVENOX) injection 40 mg  40 mg Subcutaneous Q24H Norval Morton, MD   40 mg at 12/21/15 [id number]  .  famotidine (PEPCID) tablet 20 mg  20 mg Oral QHS Modena Jansky, MD   20 mg at 12/20/15 2126  . ferrous sulfate tablet 325 mg  325 mg Oral TID WC Modena Jansky, MD   325 mg at 12/21/15 [id number]  . gabapentin (NEURONTIN) capsule 600 mg  600 mg Oral TID Modena Jansky, MD   600 mg at 12/21/15 0920  . guaiFENesin (MUCINEX) 12 hr tablet 600 mg  600 mg Oral BID Norval Morton, MD   600 mg at 12/21/15 0920  . HYDROcodone-acetaminophen (NORCO) 7.5-325 MG per tablet 1 tablet  1 tablet Oral Q6H PRN Modena Jansky, MD      . ipratropium (ATROVENT) nebulizer solution 0.5 mg  0.5 mg Nebulization QID Modena Jansky, MD   0.5 mg at 12/21/15 0954  . levalbuterol (XOPENEX) nebulizer solution 0.63 mg  0.63 mg Nebulization QID Modena Jansky, MD   0.63 mg at 12/21/15 0954  . levalbuterol (XOPENEX) nebulizer solution 0.63 mg  0.63 mg Nebulization Q4H PRN Modena Jansky, MD      . Linaclotide Rolan Lipa) capsule 145 mcg  145 mcg Oral QAC breakfast Modena Jansky, MD   145 mcg at 12/21/15 0920  . magnesium hydroxide (MILK OF MAGNESIA) suspension 30 mL  30 mL Oral Daily PRN Modena Jansky, MD      . methocarbamol (ROBAXIN) tablet 500 mg  500 mg Oral BID Modena Jansky, MD   500 mg at 12/21/15 0920  . mupirocin ointment (BACTROBAN) 2 % 1 application  1 application Nasal BID Norval Morton, MD   1 application at [id number] [phone]  . ondansetron (ZOFRAN) tablet 4 mg  4 mg Oral Q6H PRN Norval Morton, MD      . oseltamivir (TAMIFLU) capsule 30 mg  30 mg Oral BID Modena Jansky, MD   30 mg at 12/21/15 0919  . polyvinyl alcohol (LIQUIFILM TEARS) 1.4 % ophthalmic solution 1 drop  1 drop Right Eye TID Norval Morton, MD   1 drop at 12/21/15 0921  . potassium chloride SA (K-DUR,KLOR-CON) CR tablet 20 mEq  20 mEq Oral Daily Modena Jansky, MD   20 mEq at 12/21/15 0919  . sertraline (ZOLOFT) tablet 25 mg  25 mg Oral Daily Modena Jansky, MD   25 mg at 12/21/15 0921  . sertraline (ZOLOFT) tablet 50 mg  50 mg  Oral Daily Modena Jansky, MD   50 mg at 12/21/15 0920  . sodium chloride flush (NS) 0.9 % injection 3 mL  3 mL Intravenous Q12H Norval Morton, MD   3 mL at 12/21/15 [id number]     Discharge Medications: Please see discharge summary for a list of discharge medications.  Relevant Imaging Results:  Relevant Lab Results:   Additional Information SSN: [id number]  Standley Brooking, LCSW

## 2015-12-22 ENCOUNTER — Non-Acute Institutional Stay (SKILLED_NURSING_FACILITY): Payer: Medicare Other | Admitting: Internal Medicine

## 2015-12-22 ENCOUNTER — Encounter: Payer: Self-pay | Admitting: Internal Medicine

## 2015-12-22 DIAGNOSIS — K5900 Constipation, unspecified: Secondary | ICD-10-CM

## 2015-12-22 DIAGNOSIS — F329 Major depressive disorder, single episode, unspecified: Secondary | ICD-10-CM

## 2015-12-22 DIAGNOSIS — I10 Essential (primary) hypertension: Secondary | ICD-10-CM

## 2015-12-22 DIAGNOSIS — G894 Chronic pain syndrome: Secondary | ICD-10-CM

## 2015-12-22 DIAGNOSIS — R627 Adult failure to thrive: Secondary | ICD-10-CM

## 2015-12-22 DIAGNOSIS — Z8673 Personal history of transient ischemic attack (TIA), and cerebral infarction without residual deficits: Secondary | ICD-10-CM | POA: Diagnosis not present

## 2015-12-22 DIAGNOSIS — J111 Influenza due to unidentified influenza virus with other respiratory manifestations: Secondary | ICD-10-CM

## 2015-12-22 DIAGNOSIS — G6289 Other specified polyneuropathies: Secondary | ICD-10-CM

## 2015-12-22 DIAGNOSIS — D509 Iron deficiency anemia, unspecified: Secondary | ICD-10-CM

## 2015-12-22 NOTE — Progress Notes (Signed)
Patient ID: [name], female   DOB: 02-18-1925, 82 y.o.   MRN: [id number]    HISTORY AND PHYSICAL   DATE: 12/22/15  Location:  [place] of Service: SNF [phone])   Extended Emergency Contact Information Primary Emergency Contact: Schiavi,Thomas Address: [address]          [place], [place] [address] [place] Phone: [phone] Mobile Phone: [phone] Relation: Son Secondary Emergency Contact: Davis,Saranetti  [place] Phone: [phone] Relation: Other  Advanced Directive information  FULL CODE  Chief Complaint  Patient presents with  . Readmit To SNF    HPI:  82 yo female long term resident seen today for readmission into SNF following hospital stay for influenza A, sepsis, HTN.  WBC 12.9K on admission -->  . BC neg at d/c. Influenza panel PCR (+) A. Broad spect  IV abx stopped. Lactic acid level 2.4. HR initally 120s but improved with tx of infection. Electrolytes repleted. Diarrhea neg C diff toxin.  [name] LLE pain today. No falls. No nursing issues. No f/c, Cp or SOB. No cough. She is a poor historian due to MDD. Hx obtained from chart  Hypertension: - BP stable on norvasc 5 mg daily   Chronic pain syndrome - due to arthritis and spinal cord compression. Pain controlled on neurontin 600 mg three times daily, voltaren gel 4 gm to knees four times daily and 2 gm to right shoulder, norco 7.5/325 mg every 6 hours as needed   Anemia -stable on iron  Daily. hgb 11.8  Constipation - stable on linzess 145 mcg daily   GERD- stable on zantac 150 mg daily   Depression - mood stable on zoloft 75 mg daily  Peripheral neuropathy - stable on neurontin 600 mg three times daily   Hx CVA - is neurologically stable without meds   Hypokalemia - stable on KCL daily  FTT - takes nutritional supplement TID. Albumin 2.9  Past Medical History  Diagnosis Date  . Hypertension   . Head ache   . Renal  insufficiency, mild   . Depression   . Fecal occult blood test positive   . Restless leg syndrome   . Anemia   . Cataracts, bilateral   . Hemorrhoids   . Rupture of rotator cuff of shoulder     s/p repair by Dr. Berenice Primas 7/07. Follows with Dr. Tamera Punt for possible shoulder surgery.   . Lumbar spinal stenosis     gets Spinal injections by Dr. Marlaine Hind   . Postnasal drip   . Atrial tachycardia ([place])     echo 04/08/11: EF 55-60%, mild LVH, grade 1 diast dysfxn;    s/p RFCA with Dr. Lovena Le  04/09/11  . Hyperkalemia 04/08/2011  . Unspecified deficiency anemia   . Osteoporosis, unspecified 04/09/2013    DEXA 5/07 : T L forearm -2.9. L femur -0.6, R femur -1.4. [name]  . CVA (cerebral infarction) 04/09/2013    MRI 2005 : Multiple tiny old lacunar infarcts as well as scattered white matter small vessel ischemic disease. Pt asymptomatic and denies TIA / CVA hx.    . Chronic pain syndrome 04/09/2013    On chronic opioids. Cervical MRI 11/ 2012 : Diffuse cervical spondylosis, Diffuse degenerative disc disease with multilevel foraminal stenosis. Possible T1 nerve root involvement. Lumbar MRI 2009 : prior fusion at L3-4 and degenerative disc disease Rotator cuff repair : Dr. Berenice Primas  7/07. Follows with Dr. Tamera Punt for possible shoulder surgery.  H/O lumbar spinal stenosis with steroid injections. Mo  . Arthritis     b/l shoulder R>L  . Rotator cuff tear arthropathy     03/22/13 dx [facility] (Dr. Tamera Punt) considering surgery  . Stroke ([place])   . History of blood transfusion 2014    anemia-   . SVT (supraventricular tachycardia) ([place])   . GERD (gastroesophageal reflux disease)     Past Surgical History  Procedure Laterality Date  . Doppler echocardiography  2004  . Rotator cuff repair      right  . Back surgery      lumbar x 2, 2000 and 2004 by Dr. Deri Fuelling.   . Eye surgery Bilateral     cataract  . Abdominal hysterectomy    . Orif finger fracture Left     ring  finger  . Lumbar laminectomy/decompression microdiscectomy Left 08/06/2013    Procedure: Left Thoracic eleven-twelve microdiskectomy ;  Surgeon: Charlie Pitter, MD;  Location: [facility];  Service: Neurosurgery;  Laterality: Left;  Left Thoracic eleven-twelve microdiskectomy     Patient Care Team: [name], DO as PCP - General (Internal Medicine) Juanita Craver, MD as Attending Physician (Gastroenterology) Gerlene Fee, NP as Nurse Practitioner (Geriatric Medicine) [place] ([place])  Social History   Social History  . Marital Status: Single    Spouse Name: N/A  . Number of Children: 1  . Years of Education: N/A   Occupational History  .     Social History Main Topics  . Smoking status: Never Smoker   . Smokeless tobacco: Not on file  . Alcohol Use: No  . Drug Use: No  . Sexual Activity: Not on file   Other Topics Concern  . Not on file   Social History Narrative   Originally from [place]. She has been here in the [place] since the 60's.   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        reports that she has never smoked. She does not have any smokeless tobacco history on file. She reports that she does not drink alcohol or use illicit drugs.  Family History  Problem Relation Age of Onset  . Coronary artery disease Neg Hx     premature   No family status information on file.    Immunization History  Administered Date(s) Administered  . Influenza Split 07/11/2011, 10/20/2011, 07/30/2012  . Influenza Whole 08/16/2007, 06/09/2010  . Influenza,inj,Quad PF,36+ Mos 06/27/2013  . Influenza-Unspecified 07/23/2014, 07/03/2015  . PPD Test 08/12/2013  . Pneumococcal Polysaccharide-23 07/11/2011  . Pneumococcal-Unspecified 07/28/2014  . Td 06/18/2009  . Tdap 06/28/2014    Allergies  Allergen Reactions  . Codeine      Hallucinations  . Ibuprofen     Confusion    Medications: Patient's Medications  New Prescriptions   No medications on file  Previous Medications   AMLODIPINE (NORVASC) 5 MG TABLET    Take 1 tablet (5 mg total) by mouth daily.   CALCIUM PO    Take 600 mg by mouth 2 (two) times daily.    CARBOXYMETHYLCELLULOSE (REFRESH PLUS) 0.5 % SOLN    Place 1 drop into the right eye 3 (three) times daily.    DICLOFENAC SODIUM (VOLTAREN) 1 % GEL  Apply 4 g topically 4 (four) times daily. Bilateral knees And 2 gm to right shoulder   FERROUS SULFATE 325 (65 FE) MG TABLET    Take 325 mg by mouth 3 (three) times daily with meals.    GABAPENTIN (NEURONTIN) 600 MG TABLET    Take 600 mg by mouth 3 (three) times daily.   HYDROCODONE-ACETAMINOPHEN (NORCO) 7.5-325 MG TABLET    Take 1 tablet by mouth every 6 (six) hours as needed for moderate pain.    LINACLOTIDE (LINZESS) 145 MCG CAPS CAPSULE    Take 145 mcg by mouth daily. Give 30 minutes prior to first meal.   LOPERAMIDE (IMODIUM) 2 MG CAPSULE    Take 2 mg by mouth as needed for diarrhea or loose stools.   MAGNESIUM HYDROXIDE (MILK OF MAGNESIA) 400 MG/5ML SUSPENSION    Take 30 mLs by mouth daily as needed for mild constipation.   MENTHOL-METHYL SALICYLATE (BENGAY GREASELESS EX)    Apply 1 application topically 2 (two) times daily as needed. To hands   METHOCARBAMOL (ROBAXIN) 500 MG TABLET    Take 500 mg by mouth 2 (two) times daily.   OSELTAMIVIR (TAMIFLU) 30 MG CAPSULE    Take 1 capsule (30 mg total) by mouth 2 (two) times daily. Discontinue after 12/23/15 doses.   POLYETHYLENE GLYCOL (MIRALAX / GLYCOLAX) PACKET    Take 17 g by mouth daily.   POTASSIUM CHLORIDE SA (K-DUR,KLOR-CON) 20 MEQ TABLET    Take 20 mEq by mouth daily.   RANITIDINE (ZANTAC) 150 MG TABLET    Take 150 mg by mouth at bedtime.   SERTRALINE (ZOLOFT) 25 MG TABLET    Take 25 mg by mouth daily. Give with 50 mg tab to equal 75 mg   SERTRALINE (ZOLOFT) 50 MG TABLET    Take 50 mg by mouth daily.  Give with 25 mg tab to equal 75 mg total.  Modified Medications   No medications on file  Discontinued Medications   No medications on file    Review of Systems  Unable to perform ROS: Psychiatric disorder    Filed Vitals:   12/22/15 1623  BP: 137/93  Pulse: 80  Temp: 99.4 F (37.4 C)  Weight: 157 lb (71.215 kg)  SpO2: 94%   Body mass index is 28.71 kg/(m^2).  Physical Exam  Constitutional: She appears well-developed.  Frail appearing, lying in bed in NAD  HENT:  Mouth/Throat: Oropharynx is clear and moist. No oropharyngeal exudate.  Eyes: Pupils are equal, round, and reactive to light. No scleral icterus.  Neck: Neck supple. Carotid bruit is not present. No tracheal deviation present. No thyromegaly present.  Cardiovascular: Normal rate, regular rhythm and intact distal pulses.  Exam reveals no gallop and no friction rub.   Murmur (1/6 SEM) heard. No LE edema b/l. no calf TTP.   Pulmonary/Chest: Effort normal and breath sounds normal. No stridor. No respiratory distress. She has no wheezes. She has no rales.  Abdominal: Soft. Bowel sounds are normal. She exhibits no distension and no mass. There is no hepatomegaly. There is no tenderness. There is no rebound and no guarding.  Musculoskeletal: She exhibits edema (L>R knee swelling with reduced ROM).  Lymphadenopathy:    She has no cervical adenopathy.  Neurological: She is alert.  Skin: Skin is warm and dry. No rash noted.  Psychiatric: She has a normal mood and affect. Her behavior is normal.     Labs reviewed: Admission on 12/18/2015, Discharged on 12/21/2015  Component Date Value Ref  Range Status  . Lactic Acid, Venous 12/18/2015 2.06* 0.5 - 2.0 mmol/L Final  . Comment 12/18/2015 NOTIFIED PHYSICIAN   Final  . WBC 12/18/2015 12.9* 4.0 - 10.5 K/uL Final  . RBC 12/18/2015 4.47  3.87 - 5.11 MIL/uL Final  . Hemoglobin 12/18/2015 13.6  12.0 - 15.0 g/dL Final  . HCT 12/18/2015 41.8  36.0 - 46.0 % Final  . MCV  12/18/2015 93.5  78.0 - 100.0 fL Final  . MCH 12/18/2015 30.4  26.0 - 34.0 pg Final  . MCHC 12/18/2015 32.5  30.0 - 36.0 g/dL Final  . RDW 12/18/2015 14.9  11.5 - 15.5 % Final  . Platelets 12/18/2015 204  150 - 400 K/uL Final  . Neutrophils Relative % 12/18/2015 65   Final  . Neutro Abs 12/18/2015 8.6* 1.7 - 7.7 K/uL Final  . Lymphocytes Relative 12/18/2015 25   Final  . Lymphs Abs 12/18/2015 3.2  0.7 - 4.0 K/uL Final  . Monocytes Relative 12/18/2015 9   Final  . Monocytes Absolute 12/18/2015 1.1* 0.1 - 1.0 K/uL Final  . Eosinophils Relative 12/18/2015 1   Final  . Eosinophils Absolute 12/18/2015 0.1  0.0 - 0.7 K/uL Final  . Basophils Relative 12/18/2015 0   Final  . Basophils Absolute 12/18/2015 0.0  0.0 - 0.1 K/uL Final  . Specimen Description 12/18/2015 BLOOD LEFT HAND   Final  . Special Requests 12/18/2015 BOTTLES DRAWN AEROBIC AND ANAEROBIC 5CC   Final  . Culture 12/18/2015    Final                   Value:NO GROWTH 3 DAYS Performed at [facility]   . Report Status 12/18/2015 PENDING   Incomplete  . Specimen Description 12/18/2015 BLOOD RIGHT HAND   Final  . Special Requests 12/18/2015 BOTTLES DRAWN AEROBIC AND ANAEROBIC 5CC   Final  . Culture 12/18/2015    Final                   Value:NO GROWTH 3 DAYS Performed at [facility]   . Report Status 12/18/2015 PENDING   Incomplete  . Color, Urine 12/18/2015 YELLOW  YELLOW Final  . APPearance 12/18/2015 CLEAR  CLEAR Final  . Specific Gravity, Urine 12/18/2015 1.021  1.005 - 1.030 Final  . pH 12/18/2015 6.5  5.0 - 8.0 Final  . Glucose, UA 12/18/2015 NEGATIVE  NEGATIVE mg/dL Final  . Hgb urine dipstick 12/18/2015 TRACE* NEGATIVE Final  . Bilirubin Urine 12/18/2015 NEGATIVE  NEGATIVE Final  . Ketones, ur 12/18/2015 NEGATIVE  NEGATIVE mg/dL Final  . Protein, ur 12/18/2015 >300* NEGATIVE mg/dL Final  . Nitrite 12/18/2015 NEGATIVE  NEGATIVE Final  . Leukocytes, UA 12/18/2015 NEGATIVE  NEGATIVE Final  . Specimen  Description 12/18/2015 URINE, RANDOM   Final  . Special Requests 12/18/2015 NONE   Final  . Culture 12/18/2015    Final                   Value:MULTIPLE SPECIES PRESENT, SUGGEST RECOLLECTION Performed at [facility]   . Report Status 12/18/2015 12/20/2015 FINAL   Final  . Influenza A By PCR 12/19/2015 POSITIVE* NEGATIVE Final  . Influenza B By PCR 12/19/2015 NEGATIVE  NEGATIVE Final  . H1N1 flu by pcr 12/19/2015 NOT DETECTED  NOT DETECTED Final   Comment:        The Xpert Flu assay (FDA approved for nasal aspirates or washes and nasopharyngeal swab specimens), is intended as an aid  in the diagnosis of influenza and should not be used as a sole basis for treatment. Performed at [facility]   . Sodium 12/19/2015 138  135 - 145 mmol/L Final  . Potassium 12/19/2015 4.6  3.5 - 5.1 mmol/L Final  . Chloride 12/19/2015 105  101 - 111 mmol/L Final  . CO2 12/19/2015 22  22 - 32 mmol/L Final  . Glucose, Bld 12/19/2015 124* 65 - 99 mg/dL Final  . BUN 12/19/2015 21* 6 - 20 mg/dL Final  . Creatinine, Ser 12/19/2015 1.05* 0.44 - 1.00 mg/dL Final  . Calcium 12/19/2015 9.0  8.9 - 10.3 mg/dL Final  . Total Protein 12/19/2015 9.6* 6.5 - 8.1 g/dL Final  . Albumin 12/19/2015 3.3* 3.5 - 5.0 g/dL Final  . AST 12/19/2015 80* 15 - 41 U/L Final  . ALT 12/19/2015 25  14 - 54 U/L Final  . Alkaline Phosphatase 12/19/2015 142* 38 - 126 U/L Final  . Total Bilirubin 12/19/2015 0.7  0.3 - 1.2 mg/dL Final  . GFR calc non Af Amer 12/19/2015 46* >60 mL/min Final  . GFR calc Af Amer 12/19/2015 53* >60 mL/min Final   Comment: (NOTE) The eGFR has been calculated using the CKD EPI equation. This calculation has not been validated in all clinical situations. eGFR's persistently <60 mL/min signify possible Chronic Kidney Disease.   . Anion gap 12/19/2015 11  5 - 15 Final  . Squamous Epithelial / LPF 12/18/2015 0-5* NONE SEEN Final  . WBC, UA 12/18/2015 0-5  0 - 5 WBC/hpf Final  . RBC / HPF  12/18/2015 6-30  0 - 5 RBC/hpf Final  . Bacteria, UA 12/18/2015 RARE* NONE SEEN Final  . Casts 12/18/2015 GRANULAR CAST* NEGATIVE Final  . Lactic Acid, Venous 12/19/2015 4.5* 0.5 - 2.0 mmol/L Final   Comment: CRITICAL RESULT CALLED TO, READ BACK BY AND VERIFIED WITH: C.WARD,RN AT 0346 ON 12/19/15 BY W.SHEA   . Procalcitonin 12/19/2015 <0.10   Final   Comment:        Interpretation: PCT (Procalcitonin) <= 0.5 ng/mL: Systemic infection (sepsis) is not likely. Local bacterial infection is possible. (NOTE)         ICU PCT Algorithm               Non ICU PCT Algorithm    ----------------------------     ------------------------------         PCT < 0.25 ng/mL                 PCT < 0.1 ng/mL     Stopping of antibiotics            Stopping of antibiotics       strongly encouraged.               strongly encouraged.    ----------------------------     ------------------------------       PCT level decrease by               PCT < 0.25 ng/mL       >= 80% from peak PCT       OR PCT 0.25 - 0.5 ng/mL          Stopping of antibiotics                                             encouraged.  Stopping of antibiotics           encouraged.    ----------------------------     ------------------------------       PCT level decrease by              PCT >= 0.25 ng/mL       < 80% from peak PCT        AND PCT >= 0.5 ng/mL            Continuin                          g antibiotics                                              encouraged.       Continuing antibiotics            encouraged.    ----------------------------     ------------------------------     PCT level increase compared          PCT > 0.5 ng/mL         with peak PCT AND          PCT >= 0.5 ng/mL             Escalation of antibiotics                                          strongly encouraged.      Escalation of antibiotics        strongly encouraged.   . Prothrombin Time 12/19/2015 15.3* 11.6 - 15.2 seconds Final  . INR 12/19/2015  1.25  0.00 - 1.49 Final  . aPTT 12/19/2015 32  24 - 37 seconds Final  . B Natriuretic Peptide 12/19/2015 209.3* 0.0 - 100.0 pg/mL Final  . Troponin I 12/19/2015 0.06* <0.031 ng/mL Final   Comment:        PERSISTENTLY INCREASED TROPONIN VALUES IN THE RANGE OF 0.04-0.49 ng/mL CAN BE SEEN IN:       -UNSTABLE ANGINA       -CONGESTIVE HEART FAILURE       -MYOCARDITIS       -CHEST TRAUMA       -ARRYHTHMIAS       -LATE PRESENTING MYOCARDIAL INFARCTION       -COPD   CLINICAL FOLLOW-UP RECOMMENDED.   . Magnesium 12/19/2015 1.6* 1.7 - 2.4 mg/dL Final  . Sodium 12/19/2015 QUESTIONABLE RESULTS, RECOMMEND RECOLLECT TO VERIFY  135 - 145 mmol/L Final  . Potassium 12/19/2015 QUESTIONABLE RESULTS, RECOMMEND RECOLLECT TO VERIFY  3.5 - 5.1 mmol/L Final  . Chloride 12/19/2015 QUESTIONABLE RESULTS, RECOMMEND RECOLLECT TO VERIFY  101 - 111 mmol/L Final  . CO2 12/19/2015 QUESTIONABLE RESULTS, RECOMMEND RECOLLECT TO VERIFY  22 - 32 mmol/L Final  . Glucose, Bld 12/19/2015 125* 65 - 99 mg/dL Final  . BUN 12/19/2015 16  6 - 20 mg/dL Final  . Creatinine, Ser 12/19/2015 0.87  0.44 - 1.00 mg/dL Final  . Calcium 12/19/2015 QUESTIONABLE RESULTS, RECOMMEND RECOLLECT TO VERIFY  8.9 - 10.3 mg/dL Final  . Total Protein 12/19/2015 8.2* 6.5 - 8.1 g/dL Final  . Albumin 12/19/2015 2.9* 3.5 - 5.0 g/dL Final  . AST 12/19/2015 57* 15 -  41 U/L Final  . ALT 12/19/2015 21  14 - 54 U/L Final  . Alkaline Phosphatase 12/19/2015 108  38 - 126 U/L Final  . Total Bilirubin 12/19/2015 0.3  0.3 - 1.2 mg/dL Final  . GFR calc non Af Amer 12/19/2015 58* >60 mL/min Final  . GFR calc Af Amer 12/19/2015 >60  >60 mL/min Final   Comment: (NOTE) The eGFR has been calculated using the CKD EPI equation. This calculation has not been validated in all clinical situations. eGFR's persistently <60 mL/min signify possible Chronic Kidney Disease.   . Anion gap 12/19/2015 QUESTIONABLE RESULTS, RECOMMEND RECOLLECT TO VERIFY  5 - 15 Final  . MRSA  by PCR 12/19/2015 POSITIVE* NEGATIVE Final   Comment:        The GeneXpert MRSA Assay (FDA approved for NASAL specimens only), is one component of a comprehensive MRSA colonization surveillance program. It is not intended to diagnose MRSA infection nor to guide or monitor treatment for MRSA infections. RESULT CALLED TO, READ BACK BY AND VERIFIED WITH: C DENNY AT [place] ON 03.11.2017 BY NBROOKS   . WBC 12/19/2015 15.3* 4.0 - 10.5 K/uL Final  . RBC 12/19/2015 3.86* 3.87 - 5.11 MIL/uL Final  . Hemoglobin 12/19/2015 11.4* 12.0 - 15.0 g/dL Final  . HCT 12/19/2015 36.1  36.0 - 46.0 % Final  . MCV 12/19/2015 93.5  78.0 - 100.0 fL Final  . MCH 12/19/2015 29.5  26.0 - 34.0 pg Final  . MCHC 12/19/2015 31.6  30.0 - 36.0 g/dL Final  . RDW 12/19/2015 15.3  11.5 - 15.5 % Final  . Platelets 12/19/2015 184  150 - 400 K/uL Final  . Sodium 12/19/2015 145  135 - 145 mmol/L Final  . Potassium 12/19/2015 3.5  3.5 - 5.1 mmol/L Final  . Chloride 12/19/2015 113* 101 - 111 mmol/L Final  . CO2 12/19/2015 21* 22 - 32 mmol/L Final  . Glucose, Bld 12/19/2015 121* 65 - 99 mg/dL Final  . BUN 12/19/2015 16  6 - 20 mg/dL Final  . Creatinine, Ser 12/19/2015 0.85  0.44 - 1.00 mg/dL Final  . Calcium 12/19/2015 8.5* 8.9 - 10.3 mg/dL Final  . Total Protein 12/19/2015 8.5* 6.5 - 8.1 g/dL Final  . Albumin 12/19/2015 2.9* 3.5 - 5.0 g/dL Final  . AST 12/19/2015 51* 15 - 41 U/L Final  . ALT 12/19/2015 20  14 - 54 U/L Final  . Alkaline Phosphatase 12/19/2015 106  38 - 126 U/L Final  . Total Bilirubin 12/19/2015 0.4  0.3 - 1.2 mg/dL Final  . GFR calc non Af Amer 12/19/2015 59* >60 mL/min Final  . GFR calc Af Amer 12/19/2015 >60  >60 mL/min Final   Comment: (NOTE) The eGFR has been calculated using the CKD EPI equation. This calculation has not been validated in all clinical situations. eGFR's persistently <60 mL/min signify possible Chronic Kidney Disease.   . Anion gap 12/19/2015 11  5 - 15 Final  . Lactic Acid,  Venous 12/19/2015 2.4* 0.5 - 2.0 mmol/L Final   Comment: CRITICAL RESULT CALLED TO, READ BACK BY AND VERIFIED WITH: A ASHLEY AT 0823 ON 03.11.2017 BY NBROOKS   . WBC 12/20/2015 19.0* 4.0 - 10.5 K/uL Final  . RBC 12/20/2015 3.77* 3.87 - 5.11 MIL/uL Final  . Hemoglobin 12/20/2015 11.2* 12.0 - 15.0 g/dL Final  . HCT 12/20/2015 33.6* 36.0 - 46.0 % Final  . MCV 12/20/2015 89.1  78.0 - 100.0 fL Final  . MCH 12/20/2015 29.7  26.0 - 34.0 pg  Final  . MCHC 12/20/2015 33.3  30.0 - 36.0 g/dL Final  . RDW 12/20/2015 15.2  11.5 - 15.5 % Final  . Platelets 12/20/2015 169  150 - 400 K/uL Final  . Sodium 12/20/2015 144  135 - 145 mmol/L Final  . Potassium 12/20/2015 3.8  3.5 - 5.1 mmol/L Final  . Chloride 12/20/2015 111  101 - 111 mmol/L Final  . CO2 12/20/2015 23  22 - 32 mmol/L Final  . Glucose, Bld 12/20/2015 94  65 - 99 mg/dL Final  . BUN 12/20/2015 14  6 - 20 mg/dL Final  . Creatinine, Ser 12/20/2015 0.83  0.44 - 1.00 mg/dL Final  . Calcium 12/20/2015 8.4* 8.9 - 10.3 mg/dL Final  . Total Protein 12/20/2015 8.1  6.5 - 8.1 g/dL Final  . Albumin 12/20/2015 2.9* 3.5 - 5.0 g/dL Final  . AST 12/20/2015 49* 15 - 41 U/L Final  . ALT 12/20/2015 18  14 - 54 U/L Final  . Alkaline Phosphatase 12/20/2015 104  38 - 126 U/L Final  . Total Bilirubin 12/20/2015 0.6  0.3 - 1.2 mg/dL Final  . GFR calc non Af Amer 12/20/2015 >60  >60 mL/min Final  . GFR calc Af Amer 12/20/2015 >60  >60 mL/min Final   Comment: (NOTE) The eGFR has been calculated using the CKD EPI equation. This calculation has not been validated in all clinical situations. eGFR's persistently <60 mL/min signify possible Chronic Kidney Disease.   . Anion gap 12/20/2015 10  5 - 15 Final  . Magnesium 12/20/2015 1.8  1.7 - 2.4 mg/dL Final  . Lactic Acid, Venous 12/20/2015 0.8  0.5 - 2.0 mmol/L Final  . C Diff antigen 12/20/2015 NEGATIVE  NEGATIVE Final  . C Diff toxin 12/20/2015 NEGATIVE  NEGATIVE Final  . C Diff interpretation 12/20/2015  Negative for toxigenic C. difficile   Final  . WBC 12/21/2015 13.5* 4.0 - 10.5 K/uL Final  . RBC 12/21/2015 3.97  3.87 - 5.11 MIL/uL Final  . Hemoglobin 12/21/2015 11.8* 12.0 - 15.0 g/dL Final  . HCT 12/21/2015 36.8  36.0 - 46.0 % Final  . MCV 12/21/2015 92.7  78.0 - 100.0 fL Final  . MCH 12/21/2015 29.7  26.0 - 34.0 pg Final  . MCHC 12/21/2015 32.1  30.0 - 36.0 g/dL Final  . RDW 12/21/2015 15.1  11.5 - 15.5 % Final  . Platelets 12/21/2015 172  150 - 400 K/uL Final  Nursing Home on 11/20/2015  Component Date Value Ref Range Status  . Hemoglobin 09/28/2015 10.3* 12.0 - 16.0 g/dL Final  . HCT 09/28/2015 30* 36 - 46 % Final  . Platelets 09/28/2015 184  150 - 399 K/L Final  . WBC 09/28/2015 5.6   Final  . Glucose 09/28/2015 104   Final  . BUN 09/28/2015 12  4 - 21 mg/dL Final  . Creatinine 09/28/2015 0.8  0.5 - 1.1 mg/dL Final  . Potassium 09/28/2015 4.3  3.4 - 5.3 mmol/L Final  . Sodium 09/28/2015 141  137 - 147 mmol/L Final  . LDl/HDL Ratio 09/28/2015 3.9   Final  . Triglycerides 09/28/2015 83  40 - 160 mg/dL Final  . Cholesterol 09/28/2015 142  0 - 200 mg/dL Final  . HDL 09/28/2015 36  35 - 70 mg/dL Final  . LDL Cholesterol 09/28/2015 89   Final  . Alkaline Phosphatase 09/28/2015 81  25 - 125 U/L Final  . ALT 09/28/2015 12  7 - 35 U/L Final  . AST 09/28/2015 10* 13 -  35 U/L Final  . Bilirubin, Total 09/28/2015 0.4   Final  . Hemoglobin A1C 09/28/2015 6.3   Final  . TSH 09/28/2015 1.04  0.41 - 5.90 uIU/mL Final    Dg Chest Port 1 View  12/19/2015  CLINICAL DATA:  Shortness of breath.  Hypertension. EXAM: PORTABLE CHEST - 1 VIEW COMPARISON:  One-view chest 12/18/2015. FINDINGS: The heart is mildly enlarged. Aeration is slightly improved. No focal airspace consolidation is evident. Mild interstitial prominence per cysts. Mark degenerative changes are noted at both shoulders. IMPRESSION: 1. Slightly improved aeration with persistent interstitial prominence. This is likely in part  chronic. 2. No focal airspace consolidation. Electronically Signed   By: San Morelle M.D.   On: 12/19/2015 08:22   Dg Chest Port 1 View  12/19/2015  CLINICAL DATA:  Fever, cough, chills EXAM: PORTABLE CHEST 1 VIEW COMPARISON:  11/05/2014 FINDINGS: Chronic cardiomegaly and aortic tortuosity. Pulmonary venous congestion without edema or pneumonia. No effusion or pneumothorax. IMPRESSION: 1. No focal pneumonia. 2. Pulmonary venous congestion. Electronically Signed   By: Monte Fantasia M.D.   On: 12/19/2015 00:46     Assessment/Plan   ICD-9-CM ICD-10-CM   1. Influenza - resolving 487.1 J11.1   2. Essential hypertension 401.9 I10   3. Chronic pain syndrome 338.4 G89.4   4. Other polyneuropathy ([place])  G62.89   5. History of stroke V12.54 Z86.73   6. Major depressive disorder with single episode, remission status unspecified ([place]) 296.20 F32.9   7. Iron deficiency anemia 280.9 D50.9   8. Constipation, unspecified constipation type 564.00 K59.00   9.      Failure to thrive  Finish Tamiflu on 12/23/15. Will monitor as she still has low grade temps  Check CBC and BMP on 3/16th  Follow BC. Neg thus far  Cont other meds as ordered  PT/OT/ST as ordered  Cont nutritional supplement as ordered  Wound care as ordered  GOAL: short term rehab then cont long term care. Communicated with pt and nursing.  Will follow  Meral Geissinger S. Perlie Gold  [facility] [address] [place],  [id number] [phone] Cell (Monday-Friday 8 AM - 5 PM) [phone] After 5 PM and follow prompts

## 2015-12-24 LAB — CULTURE, BLOOD (ROUTINE X 2)
CULTURE: NO GROWTH
Culture: NO GROWTH

## 2015-12-24 LAB — BASIC METABOLIC PANEL
BUN: 33 mg/dL — AB (ref 4–21)
CREATININE: 1.3 mg/dL — AB (ref 0.5–1.1)
Glucose: 124 mg/dL
Potassium: 4.2 mmol/L (ref 3.4–5.3)
Sodium: 138 mmol/L (ref 137–147)

## 2015-12-24 LAB — CBC AND DIFFERENTIAL
HCT: 36 % (ref 36–46)
Hemoglobin: 12 g/dL (ref 12.0–16.0)
PLATELETS: 213 10*3/uL (ref 150–399)
WBC: 9 10*3/mL

## 2016-01-25 ENCOUNTER — Non-Acute Institutional Stay (SKILLED_NURSING_FACILITY): Payer: Medicare Other | Admitting: Adult Health

## 2016-01-25 ENCOUNTER — Encounter: Payer: Self-pay | Admitting: Adult Health

## 2016-01-25 DIAGNOSIS — D509 Iron deficiency anemia, unspecified: Secondary | ICD-10-CM

## 2016-01-25 DIAGNOSIS — I639 Cerebral infarction, unspecified: Secondary | ICD-10-CM

## 2016-01-25 DIAGNOSIS — G6289 Other specified polyneuropathies: Secondary | ICD-10-CM

## 2016-01-25 DIAGNOSIS — E876 Hypokalemia: Secondary | ICD-10-CM | POA: Diagnosis not present

## 2016-01-25 DIAGNOSIS — I10 Essential (primary) hypertension: Secondary | ICD-10-CM | POA: Diagnosis not present

## 2016-01-25 DIAGNOSIS — G894 Chronic pain syndrome: Secondary | ICD-10-CM

## 2016-01-25 DIAGNOSIS — K5901 Slow transit constipation: Secondary | ICD-10-CM

## 2016-01-25 DIAGNOSIS — K219 Gastro-esophageal reflux disease without esophagitis: Secondary | ICD-10-CM | POA: Diagnosis not present

## 2016-01-25 NOTE — Progress Notes (Signed)
Patient ID: [name], female   DOB: 03/06/1925, 82 y.o.   MRN: [id number]   Facility: [facility]       Allergies  Allergen Reactions  . Codeine     Hallucinations  . Ibuprofen     Confusion    Chief Complaint  Patient presents with  . Medical Management of Chronic Issues    Follow up    HPI:  She is a long term resident of this facility being seen for the management of her chronic illnesses. Overall there is little change in her status. She is unable to fully participate in the hpi or ros; but does tell that she is doing well. There are no nursing concerns at this time.    Past Medical History  Diagnosis Date  . Hypertension   . Head ache   . Renal insufficiency, mild   . Depression   . Fecal occult blood test positive   . Restless leg syndrome   . Anemia   . Cataracts, bilateral   . Hemorrhoids   . Rupture of rotator cuff of shoulder     s/p repair by Dr. Berenice Primas 7/07. Follows with Dr. Tamera Punt for possible shoulder surgery.   . Lumbar spinal stenosis     gets Spinal injections by Dr. Marlaine Hind   . Postnasal drip   . Atrial tachycardia ([place])     echo 04/08/11: EF 55-60%, mild LVH, grade 1 diast dysfxn;    s/p RFCA with Dr. Lovena Le  04/09/11  . Hyperkalemia 04/08/2011  . Unspecified deficiency anemia   . Osteoporosis, unspecified 04/09/2013    DEXA 5/07 : T L forearm -2.9. L femur -0.6, R femur -1.4. [name]  . CVA (cerebral infarction) 04/09/2013    MRI 2005 : Multiple tiny old lacunar infarcts as well as scattered white matter small vessel ischemic disease. Pt asymptomatic and denies TIA / CVA hx.    . Chronic pain syndrome 04/09/2013    On chronic opioids. Cervical MRI 11/ 2012 : Diffuse cervical spondylosis, Diffuse degenerative disc disease with multilevel foraminal stenosis. Possible T1 nerve root involvement. Lumbar MRI 2009 : prior fusion at L3-4 and degenerative disc disease Rotator cuff repair : Dr. Berenice Primas 7/07. Follows with Dr. Tamera Punt for  possible shoulder surgery.  H/O lumbar spinal stenosis with steroid injections. Mo  . Arthritis     b/l shoulder R>L  . Rotator cuff tear arthropathy     03/22/13 dx [facility] (Dr. Tamera Punt) considering surgery  . Stroke ([place])   . History of blood transfusion 2014    anemia-   . SVT (supraventricular tachycardia) ([place])   . GERD (gastroesophageal reflux disease)     Past Surgical History  Procedure Laterality Date  . Doppler echocardiography  2004  . Rotator cuff repair      right  . Back surgery      lumbar x 2, 2000 and 2004 by Dr. Deri Fuelling.   . Eye surgery Bilateral     cataract  . Abdominal hysterectomy    . Orif finger fracture Left     ring finger  . Lumbar laminectomy/decompression microdiscectomy Left 08/06/2013    Procedure: Left Thoracic eleven-twelve microdiskectomy ;  Surgeon: Charlie Pitter, MD;  Location: [facility];  Service: Neurosurgery;  Laterality: Left;  Left Thoracic eleven-twelve microdiskectomy     VITAL SIGNS BP 117/72 mmHg  Pulse 70  Temp(Src) 96.2 F (35.7 C) (Oral)  Resp 20  Ht 5'  2" (1.575 m)  Wt 155 lb 8 oz (70.534 kg)  BMI 28.43 kg/m2  SpO2 94%  Patient's Medications  New Prescriptions   No medications on file  Previous Medications   AMLODIPINE (NORVASC) 5 MG TABLET    Take 1 tablet (5 mg total) by mouth daily.   CALCIUM PO    Take 600 mg by mouth 2 (two) times daily.    CARBOXYMETHYLCELLULOSE (REFRESH PLUS) 0.5 % SOLN    Place 1 drop into the right eye 3 (three) times daily.    DICLOFENAC SODIUM (VOLTAREN) 1 % GEL    Apply 4 g topically 4 (four) times daily. Bilateral knees And 2 gm to right shoulder   FERROUS SULFATE 325 (65 FE) MG TABLET    Take 325 mg by mouth 3 (three) times daily with meals.    GABAPENTIN (NEURONTIN) 600 MG TABLET    Take 600 mg by mouth 3 (three) times daily.   HYDROCODONE-ACETAMINOPHEN (NORCO) 7.5-325 MG TABLET    Take 1 tablet by mouth every 6 (six) hours as needed for moderate pain.    LOPERAMIDE  (IMODIUM) 2 MG CAPSULE    Take 2 mg by mouth every 6 (six) hours as needed for diarrhea or loose stools.    MAGNESIUM HYDROXIDE (MILK OF MAGNESIA) 400 MG/5ML SUSPENSION    Take 30 mLs by mouth daily as needed for mild constipation.   MENTHOL-METHYL SALICYLATE (BENGAY GREASELESS EX)    Apply 1 application topically 2 (two) times daily as needed. To hands   METHOCARBAMOL (ROBAXIN) 500 MG TABLET    Take 500 mg by mouth 2 (two) times daily.   POLYETHYLENE GLYCOL (MIRALAX / GLYCOLAX) PACKET    Take 17 g by mouth daily.   POTASSIUM CHLORIDE SA (K-DUR,KLOR-CON) 20 MEQ TABLET    Take 20 mEq by mouth daily.   RANITIDINE (ZANTAC) 150 MG TABLET    Take 150 mg by mouth at bedtime.   SERTRALINE (ZOLOFT) 25 MG TABLET    Take 25 mg by mouth daily. Give with 50 mg tab to equal 75 mg   SERTRALINE (ZOLOFT) 50 MG TABLET    Take 50 mg by mouth daily. Give with 25 mg tab to equal 75 mg total.  Modified Medications   No medications on file  Discontinued Medications     SIGNIFICANT DIAGNOSTIC EXAMS 10-19-14: chest x-ray: Bilateral patchy interstitial and alveolar airspace opacities most concerning for multilobar pneumonia including atypical infection  10-24-14: -d echo: - Left ventricle: The cavity size was normal. There was mild focal basal hypertrophy of the septum. Systolic function was normal. The estimated ejection fraction was in the range of 55% to 60%. Wall motion was normal; there were no regional wall motion abnormalities. Features are consistent with a pseudonormal left ventricular filling pattern, with concomitant abnormal relaxation and increased filling pressure (grade 2 diastolic dysfunction). - Aortic valve: There was mild stenosis.  - Mitral valve: There was moderate regurgitation. - Left atrium: The atrium was moderately dilated. - Atrial septum: There was a possible patent foramen ovale. - Tricuspid valve: There was moderate regurgitation. - Pulmonary arteries: Systolic pressure was severely  increased. PA peak pressure: 86 mm Hg (S).  10-27-14: Chest x-ray: Improvement in aeration without pulmonary edema or segmental infiltrate. Mild hyperinflation. Degenerative changes thoracolumbar spine and bilateral shoulders.  10-29-14: right ankle x-ray: 1. No acute bony findings. 2. Bony demineralization. 3. Degenerative findings along the posterior subtalar facet. 4. Vascular calcifications.  10-29-14: left ankle x-ray: 1. No fracture  or acute bony findings identified. 2. Sensitivity is significantly reduced due to the severity of bony demineralization. If there is a high index of suspicion of occult bony injury, MRI of the ankle would be suggested  11-05-14: chest x-ray: Mild central pulmonary vascular congestion. New mild right basilar opacity is noted concerning for subsegmental atelectasis or possibly pneumonia. Followup radiographs are recommended.  12-17-15: kub: partial constipation   12-19-15: chest x-ray: 1. No focal pneumonia. 2. Pulmonary venous congestion      LABS REVIEWED:   04-20-15: glucose 73; bun 18; creat 0.86; k+ 3.5; na++142; liver normal albumin 2.6; tsh 3.061  08-21-15: wbc 7.6; hgb 11.;3 hct 34.;3 mcv 91.7; plt 206; glucose 87; bun 19; creat 0.88; k+ 3.4; na++141; liver normal albumin 2.9 08-31-15: k+ 3.9  12-18-15: wbc 12.9; hgb 13.6; hct 41.8; mcv 93.5; plt 204; glucose 124 bun 21; creat 1.05; k+ 4.6; na++138; alk phos 142; ast 80 ;albumin 3.3 12-20-15:glcuose 94; bun 14; creat 0.83; k+ 3.8; na++144; liver normal albumin 2.9 12-21-15: wbc 13.5; hgb 11.8; hct 26.8; mcv 92.7; plt 172  12-24-15: wbc 9.0; hgb 12.0; hct 35.7; mcv 89.7. plt 213; glucose 124; bun 33; creat 1.27; k+ 4.2; na++138        Review of Systems Unable to perform ROS: Dementia     Physical Exam Constitutional: No distress.  Eyes: Conjunctivae are normal.  Neck: Neck supple. No JVD present. No thyromegaly present.  Cardiovascular: Normal rate, regular rhythm and intact distal pulses.     Respiratory: Effort normal and breath sounds normal. No respiratory distress. She has no wheezes.  GI: Soft. Bowel sounds are normal. She exhibits no distension. There is no tenderness.  Musculoskeletal: She exhibits no edema.  Able to move all extremities   Lymphadenopathy:    She has no cervical adenopathy.  Neurological: She is alert.  Skin: Skin is warm and dry. She is not diaphoretic.  Psychiatric: She has a normal mood and affect.      ASSESSMENT/ PLAN:  1. Hypertension: will continue  norvasc 5 mg daily   2. Chronic pain syndrome:  Has spinal cord compression: will continue  neurontin 600 mg three times daily; will continue voltaren gel 4 gm to knees four times daily and 2 gm to right shoulder;  Will continue robaxin 500 mg twice daily  will continue  vicodin  7.5/325 mg every 6 hours as needed will monitor her status.  She is no longer on  duragesic   3. Anemia: will continue iron three times daily and will monitor hgb is 12.0  4.  Constipation: will continue   miralax daily   5. Gerd: will continue zantac 150 mg daily   6. Depression: she is stable and does receive benefit from zoloft 75 mg daily and will monitor   7. Peripheral neuropathy: is stable will continue neurontin 600 mg three times daily   8. CVA: is neurologically stable is presently not on medications; will not make changes will monitor    9. Hypokalemia: will continue k+ 20 meq daily k+ is 4.2        Ok Edwards NP [facility]  Contact [phone] Monday through Friday 8am- 5pm  After hours call [phone]

## 2016-02-01 IMAGING — CR DG CHEST 1V PORT
1 series · 1 of 1 positions shown · non-contrast
Comparison: 06/28/2014

CLINICAL DATA: Fever, tachycardia, hypertension

EXAM:
PORTABLE CHEST - 1 VIEW

[AP]
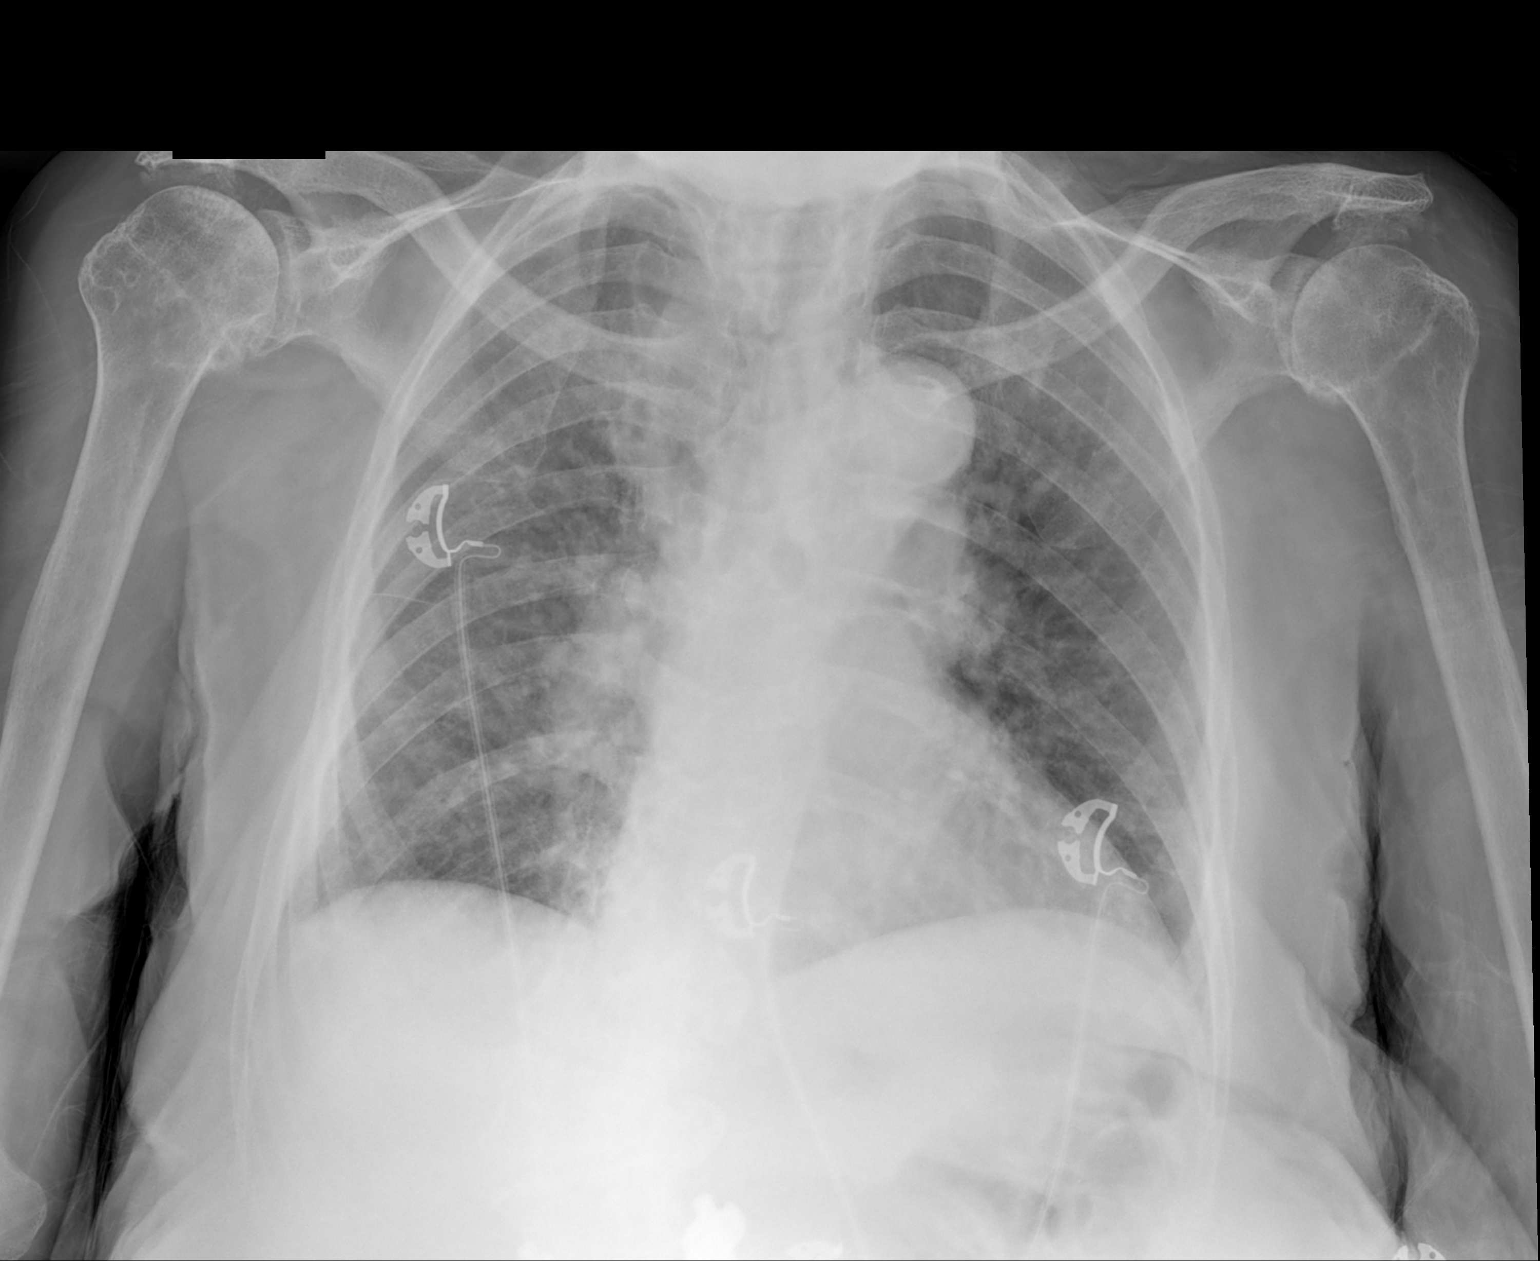

[1 of 1 positions shown; findings below may reference images not displayed]

FINDINGS: There is bilateral patchy interstitial and alveolar airspace
opacities. There is no pleural effusion or pneumothorax. The heart
mediastinum are stable. There is no acute osseous abnormality. There
is osteoarthritis of bilateral glenohumeral joints.
IMPRESSION: Bilateral patchy interstitial and alveolar airspace opacities most
concerning for multilobar pneumonia including atypical infection.

## 2016-02-24 ENCOUNTER — Encounter: Payer: Self-pay | Admitting: Adult Health

## 2016-02-24 ENCOUNTER — Non-Acute Institutional Stay (SKILLED_NURSING_FACILITY): Payer: Medicare Other | Admitting: Adult Health

## 2016-02-24 DIAGNOSIS — F329 Major depressive disorder, single episode, unspecified: Secondary | ICD-10-CM | POA: Diagnosis not present

## 2016-02-24 DIAGNOSIS — D509 Iron deficiency anemia, unspecified: Secondary | ICD-10-CM | POA: Diagnosis not present

## 2016-02-24 DIAGNOSIS — G9520 Unspecified cord compression: Secondary | ICD-10-CM | POA: Diagnosis not present

## 2016-02-24 DIAGNOSIS — G894 Chronic pain syndrome: Secondary | ICD-10-CM

## 2016-02-24 DIAGNOSIS — K219 Gastro-esophageal reflux disease without esophagitis: Secondary | ICD-10-CM

## 2016-02-24 DIAGNOSIS — F32A Depression, unspecified: Secondary | ICD-10-CM

## 2016-02-24 DIAGNOSIS — E876 Hypokalemia: Secondary | ICD-10-CM

## 2016-02-24 DIAGNOSIS — I639 Cerebral infarction, unspecified: Secondary | ICD-10-CM

## 2016-02-24 DIAGNOSIS — I10 Essential (primary) hypertension: Secondary | ICD-10-CM | POA: Diagnosis not present

## 2016-02-24 NOTE — Progress Notes (Signed)
Patient ID: [name], female   DOB: [birth year], 82 y.o.   MRN: [id number]    Location:  [facility] Room Number: 100-A Place of Service:  SNF (31)   CODE STATUS: Full Code  Allergies  Allergen Reactions  . Codeine     Hallucinations  . Ibuprofen     Confusion    Chief Complaint  Patient presents with  . Medical Management of Chronic Issues    Follow up    HPI:  She is a long term resident of this facility being seen for the management of her chronic illnesses. Overall there is little change in her status. She cannot fully participate in the hpi or ros; but did tell me that she was feeling good. There are no nursing concerns at this time.   Past Medical History  Diagnosis Date  . Hypertension   . Head ache   . Renal insufficiency, mild   . Depression   . Fecal occult blood test positive   . Restless leg syndrome   . Anemia   . Cataracts, bilateral   . Hemorrhoids   . Rupture of rotator cuff of shoulder     s/p repair by Dr. Berenice Primas 7/07. Follows with Dr. Tamera Punt for possible shoulder surgery.   . Lumbar spinal stenosis     gets Spinal injections by Dr. Marlaine Hind   . Postnasal drip   . Atrial tachycardia ([place])     echo 04/08/11: EF 55-60%, mild LVH, grade 1 diast dysfxn;    s/p RFCA with Dr. Lovena Le  04/09/11  . Hyperkalemia 04/08/2011  . Unspecified deficiency anemia   . Osteoporosis, unspecified 04/09/2013    DEXA 5/07 : T L forearm -2.9. L femur -0.6, R femur -1.4. [name]  . CVA (cerebral infarction) 04/09/2013    MRI 2005 : Multiple tiny old lacunar infarcts as well as scattered white matter small vessel ischemic disease. Pt asymptomatic and denies TIA / CVA hx.    . Chronic pain syndrome 04/09/2013    On chronic opioids. Cervical MRI 11/ 2012 : Diffuse cervical spondylosis, Diffuse degenerative disc disease with multilevel foraminal stenosis. Possible T1 nerve root involvement. Lumbar MRI 2009 : prior fusion at L3-4  and degenerative disc disease Rotator cuff repair : Dr. Berenice Primas 7/07. Follows with Dr. Tamera Punt for possible shoulder surgery.  H/O lumbar spinal stenosis with steroid injections. Mo  . Arthritis     b/l shoulder R>L  . Rotator cuff tear arthropathy     03/22/13 dx [facility] (Dr. Tamera Punt) considering surgery  . Stroke ([place])   . History of blood transfusion 2014    anemia-   . SVT (supraventricular tachycardia) ([place])   . GERD (gastroesophageal reflux disease)     Past Surgical History  Procedure Laterality Date  . Doppler echocardiography  2004  . Rotator cuff repair      right  . Back surgery      lumbar x 2, 2000 and 2004 by Dr. Deri Fuelling.   . Eye surgery Bilateral     cataract  . Abdominal hysterectomy    . Orif finger fracture Left     ring finger  . Lumbar laminectomy/decompression microdiscectomy Left 08/06/2013    Procedure: Left Thoracic eleven-twelve microdiskectomy ;  Surgeon: Charlie Pitter, MD;  Location: [facility];  Service: Neurosurgery;  Laterality: Left;  Left Thoracic eleven-twelve microdiskectomy     Social History   Social History  .  Marital Status: Single    Spouse Name: N/A  . Number of Children: 1  . Years of Education: N/A   Occupational History  .     Social History Main Topics  . Smoking status: Never Smoker   . Smokeless tobacco: Not on file  . Alcohol Use: No  . Drug Use: No  . Sexual Activity: Not on file   Other Topics Concern  . Not on file   Social History Narrative   Originally from [place]. She has been here in the [place] since the 60's.   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      Family History  Problem Relation Age of Onset  . Coronary artery disease Neg Hx     premature      VITAL SIGNS BP 141/76 mmHg  Pulse 80  Temp(Src) 97.9 F (36.6 C) (Oral)  Resp 20  Ht 5' 2"  (1.575 m)  Wt 158 lb (71.668  kg)  BMI 28.89 kg/m2  SpO2 98%  Patient's Medications  New Prescriptions   No medications on file  Previous Medications   AMLODIPINE (NORVASC) 5 MG TABLET    Take 1 tablet (5 mg total) by mouth daily.   CALCIUM PO    Take 600 mg by mouth 2 (two) times daily.    CARBOXYMETHYLCELLULOSE (REFRESH PLUS) 0.5 % SOLN    Place 1 drop into the right eye 3 (three) times daily.    DICLOFENAC SODIUM (VOLTAREN) 1 % GEL    Apply 4 g topically 4 (four) times daily. Bilateral knees And 2 gm to right shoulder   FERROUS SULFATE 325 (65 FE) MG TABLET    Take 325 mg by mouth 3 (three) times daily with meals.    GABAPENTIN (NEURONTIN) 600 MG TABLET    Take 600 mg by mouth 3 (three) times daily.   HYDROCODONE-ACETAMINOPHEN (NORCO) 7.5-325 MG TABLET    Take 1 tablet by mouth every 6 (six) hours as needed for moderate pain.    LOPERAMIDE (IMODIUM) 2 MG CAPSULE    Take 2 mg by mouth every 6 (six) hours as needed for diarrhea or loose stools.    MENTHOL-METHYL SALICYLATE (BENGAY GREASELESS EX)    Apply 1 application topically 2 (two) times daily as needed. To hands   METHOCARBAMOL (ROBAXIN) 500 MG TABLET    Take 500 mg by mouth 2 (two) times daily.   POLYETHYLENE GLYCOL (MIRALAX / GLYCOLAX) PACKET    Take 17 g by mouth daily.   POTASSIUM CHLORIDE SA (K-DUR,KLOR-CON) 20 MEQ TABLET    Take 20 mEq by mouth daily.   RANITIDINE (ZANTAC) 150 MG TABLET    Take 150 mg by mouth at bedtime.   SERTRALINE (ZOLOFT) 25 MG TABLET    Take 25 mg by mouth daily. Give with 50 mg tab to equal 75 mg   SERTRALINE (ZOLOFT) 50 MG TABLET    Take 50 mg by mouth daily. Give with 25 mg tab to equal 75 mg total.  Modified Medications   No medications on file  Discontinued Medications   MAGNESIUM HYDROXIDE (MILK OF MAGNESIA) 400 MG/5ML SUSPENSION    Take 30 mLs by mouth daily as needed for mild constipation. Reported on 02/24/2016     SIGNIFICANT DIAGNOSTIC EXAMS  10-19-14: chest x-ray: Bilateral patchy interstitial and alveolar airspace  opacities most concerning for  multilobar pneumonia including atypical infection  10-24-14: -d echo: - Left ventricle: The cavity size was normal. There was mild focal basal hypertrophy of the septum. Systolic function was normal. The estimated ejection fraction was in the range of 55% to 60%. Wall motion was normal; there were no regional wall motion abnormalities. Features are consistent with a pseudonormal left ventricular filling pattern, with concomitant abnormal relaxation and increased filling pressure (grade 2 diastolic dysfunction). - Aortic valve: There was mild stenosis.  - Mitral valve: There was moderate regurgitation. - Left atrium: The atrium was moderately dilated. - Atrial septum: There was a possible patent foramen ovale. - Tricuspid valve: There was moderate regurgitation. - Pulmonary arteries: Systolic pressure was severely increased. PA peak pressure: 86 mm Hg (S).  10-27-14: Chest x-ray: Improvement in aeration without pulmonary edema or segmental infiltrate. Mild hyperinflation. Degenerative changes thoracolumbar spine and bilateral shoulders.  10-29-14: right ankle x-ray: 1. No acute bony findings. 2. Bony demineralization. 3. Degenerative findings along the posterior subtalar facet. 4. Vascular calcifications.  10-29-14: left ankle x-ray: 1. No fracture or acute bony findings identified. 2. Sensitivity is significantly reduced due to the severity of bony demineralization. If there is a high index of suspicion of occult bony injury, MRI of the ankle would be suggested  11-05-14: chest x-ray: Mild central pulmonary vascular congestion. New mild right basilar opacity is noted concerning for subsegmental atelectasis or possibly pneumonia. Followup radiographs are recommended.  12-17-15: kub: partial constipation   12-19-15: chest x-ray: 1. No focal pneumonia. 2. Pulmonary venous congestion      LABS REVIEWED:   04-20-15: glucose 73; bun 18; creat 0.86; k+ 3.5; na++142; liver  normal albumin 2.6; tsh 3.061  08-21-15: wbc 7.6; hgb 11.3; hct 34.;3 mcv 91.7; plt 206; glucose 87; bun 19; creat 0.88; k+ 3.4; na++141; liver normal albumin 2.9 08-31-15: k+ 3.9  12-18-15: wbc 12.9; hgb 13.6; hct 41.8; mcv 93.5; plt 204; glucose 124 bun 21; creat 1.05; k+ 4.6; na++138; alk phos 142; ast 80 ;albumin 3.3 12-20-15:glcuose 94; bun 14; creat 0.83; k+ 3.8; na++144; liver normal albumin 2.9 12-21-15: wbc 13.5; hgb 11.8; hct 26.8; mcv 92.7; plt 172  12-24-15: wbc 9.0; hgb 12.0; hct 35.7; mcv 89.7. plt 213; glucose 124; bun 33; creat 1.27; k+ 4.2; na++138        Review of Systems Unable to perform ROS: Dementia     Physical Exam Constitutional: No distress.  Eyes: Conjunctivae are normal.  Neck: Neck supple. No JVD present. No thyromegaly present.  Cardiovascular: Normal rate, regular rhythm and intact distal pulses.   Respiratory: Effort normal and breath sounds normal. No respiratory distress. She has no wheezes.  GI: Soft. Bowel sounds are normal. She exhibits no distension. There is no tenderness.  Musculoskeletal: She exhibits no edema.  Able to move all extremities   Lymphadenopathy:    She has no cervical adenopathy.  Neurological: She is alert.  Skin: Skin is warm and dry. She is not diaphoretic.  Psychiatric: She has a normal mood and affect.      ASSESSMENT/ PLAN:  1. Hypertension: will continue  norvasc 5 mg daily   2. Chronic pain syndrome:  Has spinal cord compression: will continue  neurontin 600 mg three times daily; will continue voltaren gel 4 gm to knees four times daily and 2 gm to right shoulder;  Will continue robaxin 500 mg twice daily  will continue  vicodin  7.5/325 mg every 6 hours as needed will monitor her status.  She is no longer on  duragesic   3. Anemia: will continue iron three times daily and will monitor hgb is 12.0  4.  Constipation: will continue   miralax daily   5. Gerd: will continue zantac 150 mg daily   6. Depression: she  is stable and does receive benefit from zoloft 75 mg daily and will monitor   7. Peripheral neuropathy: is stable will continue neurontin 600 mg three times daily   8. CVA: is neurologically stable is presently not on medications; will not make changes will monitor    9. Hypokalemia: will continue k+ 20 meq daily k+ is 4.2       Ok Edwards NP [facility]  Contact [phone] Monday through Friday 8am- 5pm  After hours call [phone]

## 2016-03-24 ENCOUNTER — Non-Acute Institutional Stay (SKILLED_NURSING_FACILITY): Payer: Medicare Other | Admitting: Adult Health

## 2016-03-24 ENCOUNTER — Encounter: Payer: Self-pay | Admitting: Adult Health

## 2016-03-24 DIAGNOSIS — D509 Iron deficiency anemia, unspecified: Secondary | ICD-10-CM | POA: Diagnosis not present

## 2016-03-24 DIAGNOSIS — G894 Chronic pain syndrome: Secondary | ICD-10-CM | POA: Diagnosis not present

## 2016-03-24 DIAGNOSIS — I10 Essential (primary) hypertension: Secondary | ICD-10-CM

## 2016-03-24 DIAGNOSIS — K5901 Slow transit constipation: Secondary | ICD-10-CM | POA: Diagnosis not present

## 2016-03-24 DIAGNOSIS — I639 Cerebral infarction, unspecified: Secondary | ICD-10-CM

## 2016-03-24 DIAGNOSIS — G9520 Unspecified cord compression: Secondary | ICD-10-CM | POA: Diagnosis not present

## 2016-03-24 DIAGNOSIS — G6289 Other specified polyneuropathies: Secondary | ICD-10-CM

## 2016-03-24 DIAGNOSIS — E876 Hypokalemia: Secondary | ICD-10-CM

## 2016-03-24 DIAGNOSIS — K219 Gastro-esophageal reflux disease without esophagitis: Secondary | ICD-10-CM

## 2016-03-24 NOTE — Progress Notes (Signed)
Patient ID: [name], female   DOB: 1925-03-20, 82 y.o.   MRN: [id number]   Location:  [place] Room Number: 100-A Place of Service:  SNF (31)   CODE STATUS: Full Code  Allergies  Allergen Reactions  . Codeine     Hallucinations  . Ibuprofen     Confusion    Chief Complaint  Patient presents with  . Medical Management of Chronic Issues    Follow up    HPI:  She is a long term resident of this facility being seen for the management of her chronic illnesses. Overall her status is stable. She is unable to fully participate in the hpi or ros; but did tell me that she is feeling good. There are no nursing concerns at this time.   Past Medical History  Diagnosis Date  . Hypertension   . Head ache   . Renal insufficiency, mild   . Depression   . Fecal occult blood test positive   . Restless leg syndrome   . Anemia   . Cataracts, bilateral   . Hemorrhoids   . Rupture of rotator cuff of shoulder     s/p repair by Dr. Berenice Primas 7/07. Follows with Dr. Tamera Punt for possible shoulder surgery.   . Lumbar spinal stenosis     gets Spinal injections by Dr. Marlaine Hind   . Postnasal drip   . Atrial tachycardia ([place])     echo 04/08/11: EF 55-60%, mild LVH, grade 1 diast dysfxn;    s/p RFCA with Dr. Lovena Le  04/09/11  . Hyperkalemia 04/08/2011  . Unspecified deficiency anemia   . Osteoporosis, unspecified 04/09/2013    DEXA 5/07 : T L forearm -2.9. L femur -0.6, R femur -1.4. [name]  . CVA (cerebral infarction) 04/09/2013    MRI 2005 : Multiple tiny old lacunar infarcts as well as scattered white matter small vessel ischemic disease. Pt asymptomatic and denies TIA / CVA hx.    . Chronic pain syndrome 04/09/2013    On chronic opioids. Cervical MRI 11/ 2012 : Diffuse cervical spondylosis, Diffuse degenerative disc disease with multilevel foraminal stenosis. Possible T1 nerve root involvement. Lumbar MRI 2009 : prior fusion at L3-4 and  degenerative disc disease Rotator cuff repair : Dr. Berenice Primas 7/07. Follows with Dr. Tamera Punt for possible shoulder surgery.  H/O lumbar spinal stenosis with steroid injections. Mo  . Arthritis     b/l shoulder R>L  . Rotator cuff tear arthropathy     03/22/13 dx [facility] (Dr. Tamera Punt) considering surgery  . Stroke ([place])   . History of blood transfusion 2014    anemia-   . SVT (supraventricular tachycardia) ([place])   . GERD (gastroesophageal reflux disease)     Past Surgical History  Procedure Laterality Date  . Doppler echocardiography  2004  . Rotator cuff repair      right  . Back surgery      lumbar x 2, 2000 and 2004 by Dr. Deri Fuelling.   . Eye surgery Bilateral     cataract  . Abdominal hysterectomy    . Orif finger fracture Left     ring finger  . Lumbar laminectomy/decompression microdiscectomy Left 08/06/2013    Procedure: Left Thoracic eleven-twelve microdiskectomy ;  Surgeon: Charlie Pitter, MD;  Location: [place];  Service: Neurosurgery;  Laterality: Left;  Left Thoracic eleven-twelve microdiskectomy     Social History   Social History  . Marital  Status: Single    Spouse Name: N/A  . Number of Children: 1  . Years of Education: N/A   Occupational History  .     Social History Main Topics  . Smoking status: Never Smoker   . Smokeless tobacco: Not on file  . Alcohol Use: No  . Drug Use: No  . Sexual Activity: Not on file   Other Topics Concern  . Not on file   Social History Narrative   Originally from [place]. She has been here in the [place] since the 60's.   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      Family History  Problem Relation Age of Onset  . Coronary artery disease Neg Hx     premature      VITAL SIGNS BP 138/72 mmHg  Pulse 76  Temp(Src) 97.6 F (36.4 C) (Oral)  Resp 18  Ht _0  (1.575 m)  Wt 157 lb 5 oz (71.356  kg)  BMI 28.77 kg/m2  SpO2 95%  Patient's Medications  New Prescriptions   No medications on file  Previous Medications   AMLODIPINE (NORVASC) 5 MG TABLET    Take 1 tablet (5 mg total) by mouth daily.   CALCIUM PO    Take 600 mg by mouth 2 (two) times daily.    CARBOXYMETHYLCELLULOSE (REFRESH PLUS) 0.5 % SOLN    Place 1 drop into the right eye 3 (three) times daily.    DICLOFENAC SODIUM (VOLTAREN) 1 % GEL    Apply 4 g topically 4 (four) times daily. Bilateral knees And 2 gm to right shoulder   FERROUS SULFATE 325 (65 FE) MG TABLET    Take 325 mg by mouth 3 (three) times daily with meals.    GABAPENTIN (NEURONTIN) 600 MG TABLET    Take 600 mg by mouth 3 (three) times daily.   HYDROCODONE-ACETAMINOPHEN (NORCO) 7.5-325 MG TABLET    Take 1 tablet by mouth every 6 (six) hours as needed for moderate pain.    LOPERAMIDE (IMODIUM) 2 MG CAPSULE    Take 2 mg by mouth every 6 (six) hours as needed for diarrhea or loose stools.    MENTHOL-METHYL SALICYLATE (BENGAY GREASELESS EX)    Apply 1 application topically 2 (two) times daily as needed. To hands   METHOCARBAMOL (ROBAXIN) 500 MG TABLET    Take 500 mg by mouth 2 (two) times daily.   POLYETHYLENE GLYCOL (MIRALAX / GLYCOLAX) PACKET    Take 17 g by mouth daily.   POTASSIUM CHLORIDE SA (K-DUR,KLOR-CON) 20 MEQ TABLET    Take 20 mEq by mouth daily.   RANITIDINE (ZANTAC) 150 MG TABLET    Take 150 mg by mouth at bedtime.   SERTRALINE (ZOLOFT) 25 MG TABLET    Take 25 mg by mouth daily. Give with 50 mg tab to equal 75 mg   SERTRALINE (ZOLOFT) 50 MG TABLET    Take 50 mg by mouth daily. Give with 25 mg tab to equal 75 mg total.  Modified Medications   No medications on file  Discontinued Medications   No medications on file     SIGNIFICANT DIAGNOSTIC EXAMS  10-24-14: -d echo: - Left ventricle: The cavity size was normal. There was mild focal basal hypertrophy of the septum. Systolic function was normal. The estimated ejection fraction was in the range of  55% to 60%. Wall motion was normal; there were no regional wall motion abnormalities. Features are consistent with a pseudonormal left ventricular filling pattern, with concomitant abnormal relaxation and increased filling pressure (grade 2 diastolic dysfunction). - Aortic valve: There was mild stenosis.  - Mitral valve: There was moderate regurgitation. - Left atrium: The atrium was moderately dilated. - Atrial septum: There was a possible patent foramen ovale. - Tricuspid valve: There was moderate regurgitation. - Pulmonary arteries: Systolic pressure was severely increased. PA peak pressure: 86 mm Hg (S).   10-29-14: right ankle x-ray: 1. No acute bony findings. 2. Bony demineralization. 3. Degenerative findings along the posterior subtalar facet. 4. Vascular calcifications.  10-29-14: left ankle x-ray: 1. No fracture or acute bony findings identified. 2. Sensitivity is significantly reduced due to the severity of bony demineralization. If there is a high index of suspicion of occult bony injury, MRI of the ankle would be suggested  12-17-15: kub: partial constipation   12-19-15: chest x-ray: 1. No focal pneumonia. 2. Pulmonary venous congestion      LABS REVIEWED:   04-20-15: glucose 73; bun 18; creat 0.86; k+ 3.5; na++142; liver normal albumin 2.6; tsh 3.061  08-21-15: wbc 7.6; hgb 11.3; hct 34.;3 mcv 91.7; plt 206; glucose 87; bun 19; creat 0.88; k+ 3.4; na++141; liver normal albumin 2.9 08-31-15: k+ 3.9  12-18-15: wbc 12.9; hgb 13.6; hct 41.8; mcv 93.5; plt 204; glucose 124 bun 21; creat 1.05; k+ 4.6; na++138; alk phos 142; ast 80 ;albumin 3.3 12-20-15:glcuose 94; bun 14; creat 0.83; k+ 3.8; na++144; liver normal albumin 2.9 12-21-15: wbc 13.5; hgb 11.8; hct 26.8; mcv 92.7; plt 172  12-24-15: wbc 9.0; hgb 12.0; hct 35.7; mcv 89.7. plt 213; glucose 124; bun 33; creat 1.27; k+ 4.2; na++138        Review of Systems Unable to perform ROS: Dementia     Physical  Exam Constitutional: No distress.  Eyes: Conjunctivae are normal.  Neck: Neck supple. No JVD present. No thyromegaly present.  Cardiovascular: Normal rate, regular rhythm and intact distal pulses.   Respiratory: Effort normal and breath sounds normal. No respiratory distress. She has no wheezes.  GI: Soft. Bowel sounds are normal. She exhibits no distension. There is no tenderness.  Musculoskeletal: She exhibits no edema.  Able to move all extremities   Lymphadenopathy:    She has no cervical adenopathy.  Neurological: She is alert.  Skin: Skin is warm and dry. She is not diaphoretic.  Psychiatric: She has a normal mood and affect.      ASSESSMENT/ PLAN:  1. Hypertension: will continue  norvasc 5 mg daily   2. Chronic pain syndrome:  Has spinal cord compression: will continue  neurontin 600 mg three times daily; will continue voltaren gel 4 gm to knees four times daily and 2 gm to right shoulder;  Will continue robaxin 500 mg twice daily  will continue  vicodin  7.5/325 mg every 6 hours as needed will monitor her status.     3. Anemia:  hgb is 12.0 will lower iron to one time daily and will repeat cbc in 2 months   4.  Constipation: will continue   miralax daily   5. Gerd: will continue zantac 150 mg daily   6. Depression: she is stable and does receive benefit from zoloft 75 mg daily and will monitor   7. Peripheral neuropathy: is stable will continue neurontin 600 mg three times daily   8. CVA: is neurologically stable is presently  not on medications; will not make changes will monitor    9. Hypokalemia: will lower k+ to 10 meq daily and will check k+ level in 2 weeks.      Ok Edwards NP [facility] Adult [facility]  Contact [phone] Monday through Friday 8am- 5pm  After hours call [phone]

## 2016-04-19 LAB — HM DIABETES EYE EXAM

## 2016-04-25 ENCOUNTER — Non-Acute Institutional Stay (SKILLED_NURSING_FACILITY): Payer: Medicare Other | Admitting: Adult Health

## 2016-04-25 ENCOUNTER — Encounter: Payer: Self-pay | Admitting: Adult Health

## 2016-04-25 DIAGNOSIS — E876 Hypokalemia: Secondary | ICD-10-CM | POA: Diagnosis not present

## 2016-04-25 DIAGNOSIS — K219 Gastro-esophageal reflux disease without esophagitis: Secondary | ICD-10-CM | POA: Diagnosis not present

## 2016-04-25 DIAGNOSIS — G894 Chronic pain syndrome: Secondary | ICD-10-CM

## 2016-04-25 DIAGNOSIS — F0150 Vascular dementia without behavioral disturbance: Secondary | ICD-10-CM | POA: Diagnosis not present

## 2016-04-25 DIAGNOSIS — I10 Essential (primary) hypertension: Secondary | ICD-10-CM

## 2016-04-25 DIAGNOSIS — G6289 Other specified polyneuropathies: Secondary | ICD-10-CM

## 2016-04-25 DIAGNOSIS — I639 Cerebral infarction, unspecified: Secondary | ICD-10-CM | POA: Diagnosis not present

## 2016-04-25 LAB — POTASSIUM: Potassium: 4 mmol/L

## 2016-04-25 NOTE — Progress Notes (Signed)
Patient ID: [name], female   DOB: October 01, 1925, 82 y.o.   MRN: [id number]   Location:   [place] Room Number: 100-A Place of Service:  SNF (31)   CODE STATUS: Full Code  Allergies  Allergen Reactions  . Codeine     Hallucinations  . Ibuprofen     Confusion    Chief Complaint  Patient presents with  . Medical Management of Chronic Issues    Follow up    HPI:  She is a lon term resident of this facility being seen for the management of her chronic illnesses. Overall there is little change in her status.  Her weight is stable at 157 pounds. She is unable to participate in the hpi or ros. There are no nursing concerns at this time.   Past Medical History  Diagnosis Date  . Hypertension   . Head ache   . Renal insufficiency, mild   . Depression   . Fecal occult blood test positive   . Restless leg syndrome   . Anemia   . Cataracts, bilateral   . Hemorrhoids   . Rupture of rotator cuff of shoulder     s/p repair by Dr. Berenice Primas 7/07. Follows with Dr. Tamera Punt for possible shoulder surgery.   . Lumbar spinal stenosis     gets Spinal injections by Dr. Marlaine Hind   . Postnasal drip   . Atrial tachycardia ([place])     echo 04/08/11: EF 55-60%, mild LVH, grade 1 diast dysfxn;    s/p RFCA with Dr. Lovena Le  04/09/11  . Hyperkalemia 04/08/2011  . Unspecified deficiency anemia   . Osteoporosis, unspecified 04/09/2013    DEXA 5/07 : T L forearm -2.9. L femur -0.6, R femur -1.4. [name]  . CVA (cerebral infarction) 04/09/2013    MRI 2005 : Multiple tiny old lacunar infarcts as well as scattered white matter small vessel ischemic disease. Pt asymptomatic and denies TIA / CVA hx.    . Chronic pain syndrome 04/09/2013    On chronic opioids. Cervical MRI 11/ 2012 : Diffuse cervical spondylosis, Diffuse degenerative disc disease with multilevel foraminal stenosis. Possible T1 nerve root involvement. Lumbar MRI 2009 : prior fusion at L3-4 and degenerative disc  disease Rotator cuff repair : Dr. Berenice Primas 7/07. Follows with Dr. Tamera Punt for possible shoulder surgery.  H/O lumbar spinal stenosis with steroid injections. Mo  . Arthritis     b/l shoulder R>L  . Rotator cuff tear arthropathy     03/22/13 dx [facility] (Dr. Tamera Punt) considering surgery  . Stroke ([place])   . History of blood transfusion 2014    anemia-   . SVT (supraventricular tachycardia) ([place])   . GERD (gastroesophageal reflux disease)     Past Surgical History  Procedure Laterality Date  . Doppler echocardiography  2004  . Rotator cuff repair      right  . Back surgery      lumbar x 2, 2000 and 2004 by Dr. Deri Fuelling.   . Eye surgery Bilateral     cataract  . Abdominal hysterectomy    . Orif finger fracture Left     ring finger  . Lumbar laminectomy/decompression microdiscectomy Left 08/06/2013    Procedure: Left Thoracic eleven-twelve microdiskectomy ;  Surgeon: Charlie Pitter, MD;  Location: [facility];  Service: Neurosurgery;  Laterality: Left;  Left Thoracic eleven-twelve microdiskectomy     Social History   Social History  . Marital  Status: Single    Spouse Name: N/A  . Number of Children: 1  . Years of Education: N/A   Occupational History  .     Social History Main Topics  . Smoking status: Never Smoker   . Smokeless tobacco: Not on file  . Alcohol Use: No  . Drug Use: No  . Sexual Activity: Not on file   Other Topics Concern  . Not on file   Social History Narrative   Originally from [place]. She has been here in the [place] since the 60's.   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      Family History  Problem Relation Age of Onset  . Coronary artery disease Neg Hx     premature      VITAL SIGNS BP 153/85 mmHg  Pulse 84  Temp(Src) 97.5 F (36.4 C) (Oral)  Resp 18  Ht 5' 2"  (1.575 m)  Wt 157 lb (71.215 kg)  BMI 28.71 kg/m2   SpO2 94%  Patient's Medications  New Prescriptions   No medications on file  Previous Medications   AMLODIPINE (NORVASC) 5 MG TABLET    Take 1 tablet (5 mg total) by mouth daily.   CALCIUM PO    Take 600 mg by mouth 2 (two) times daily.    CARBOXYMETHYLCELLULOSE (REFRESH PLUS) 0.5 % SOLN    Place 1 drop into the right eye 3 (three) times daily.    DICLOFENAC SODIUM (VOLTAREN) 1 % GEL    Apply 4 g topically 4 (four) times daily. Bilateral knees And 2 gm to right shoulder   Ferrous sulfate 325 mg  Take 325 mg daily    GABAPENTIN (NEURONTIN) 600 MG TABLET    Take 600 mg by mouth 3 (three) times daily.   HYDROCODONE-ACETAMINOPHEN (NORCO) 7.5-325 MG TABLET    Take 1 tablet by mouth every 6 (six) hours as needed for moderate pain.    LOPERAMIDE (IMODIUM) 2 MG CAPSULE    Take 2 mg by mouth every 6 (six) hours as needed for diarrhea or loose stools.    MENTHOL-METHYL SALICYLATE (BENGAY GREASELESS EX)    Apply 1 application topically 2 (two) times daily as needed. To hands   METHOCARBAMOL (ROBAXIN) 500 MG TABLET    Take 500 mg by mouth 2 (two) times daily.    k dur 10 meq Take 10 meq daily    POLYETHYLENE GLYCOL (MIRALAX / GLYCOLAX) PACKET    Take 17 g by mouth daily.   RANITIDINE (ZANTAC) 150 MG TABLET    Take 150 mg by mouth at bedtime.   SERTRALINE (ZOLOFT) 25 MG TABLET    Take 25 mg by mouth daily. Give with 50 mg tab to equal 75 mg   SERTRALINE (ZOLOFT) 50 MG TABLET    Take 50 mg by mouth daily. Give with 25 mg tab to equal 75 mg total.  Modified Medications   No medications on file  Discontinued Medications   FERROUS SULFATE 325 (65 FE) MG TABLET    Take 325 mg by mouth 3 (three) times daily with meals. Reported on 04/25/2016   POTASSIUM CHLORIDE SA (K-DUR,KLOR-CON) 20 MEQ TABLET    Take 20 mEq by mouth daily.     SIGNIFICANT DIAGNOSTIC EXAMS  10-24-14: -d echo: - Left ventricle: The cavity size was normal. There was mild focal basal  hypertrophy of the septum. Systolic function was normal.  The estimated ejection fraction was in the range of 55% to 60%. Wall motion was normal; there were no regional wall motion abnormalities. Features are consistent with a pseudonormal left ventricular filling pattern, with concomitant abnormal relaxation and increased filling pressure (grade 2 diastolic dysfunction). - Aortic valve: There was mild stenosis.  - Mitral valve: There was moderate regurgitation. - Left atrium: The atrium was moderately dilated. - Atrial septum: There was a possible patent foramen ovale. - Tricuspid valve: There was moderate regurgitation. - Pulmonary arteries: Systolic pressure was severely increased. PA peak pressure: 86 mm Hg (S).   10-29-14: right ankle x-ray: 1. No acute bony findings. 2. Bony demineralization. 3. Degenerative findings along the posterior subtalar facet. 4. Vascular calcifications.  10-29-14: left ankle x-ray: 1. No fracture or acute bony findings identified. 2. Sensitivity is significantly reduced due to the severity of bony demineralization. If there is a high index of suspicion of occult bony injury, MRI of the ankle would be suggested  12-17-15: kub: partial constipation   12-19-15: chest x-ray: 1. No focal pneumonia. 2. Pulmonary venous congestion      LABS REVIEWED:   08-21-15: wbc 7.6; hgb 11.3; hct 34.;3 mcv 91.7; plt 206; glucose 87; bun 19; creat 0.88; k+ 3.4; na++141; liver normal albumin 2.9 08-31-15: k+ 3.9  12-18-15: wbc 12.9; hgb 13.6; hct 41.8; mcv 93.5; plt 204; glucose 124 bun 21; creat 1.05; k+ 4.6; na++138; alk phos 142; ast 80 ;albumin 3.3 12-20-15:glcuose 94; bun 14; creat 0.83; k+ 3.8; na++144; liver normal albumin 2.9 12-21-15: wbc 13.5; hgb 11.8; hct 26.8; mcv 92.7; plt 172  12-24-15: wbc 9.0; hgb 12.0; hct 35.7; mcv 89.7. plt 213; glucose 124; bun 33; creat 1.27; k+ 4.2; na++138 04-08-16: k+ 4.0         Review of Systems Unable to perform ROS: Dementia     Physical Exam Constitutional: No distress.  Eyes:  Conjunctivae are normal.  Neck: Neck supple. No JVD present. No thyromegaly present.  Cardiovascular: Normal rate, regular rhythm and intact distal pulses.   Respiratory: Effort normal and breath sounds normal. No respiratory distress. She has no wheezes.  GI: Soft. Bowel sounds are normal. She exhibits no distension. There is no tenderness.  Musculoskeletal: She exhibits no edema.  Able to move all extremities   Lymphadenopathy:    She has no cervical adenopathy.  Neurological: She is alert.  Skin: Skin is warm and dry. She is not diaphoretic.  Psychiatric: She has a normal mood and affect.      ASSESSMENT/ PLAN:  1. Hypertension: will continue  norvasc 5 mg daily   2. Chronic pain syndrome:  Has spinal cord compression: will continue  neurontin 600 mg three times daily; will continue voltaren gel 4 gm to knees four times daily and 2 gm to right shoulder;  Will continue robaxin 500 mg twice daily  will continue  vicodin  7.5/325 mg every 6 hours as needed will monitor her status.     3. Anemia:  hgb is 12.0 will continue iron  daily   cbc pending   4.  Constipation: will continue   miralax daily   5. Gerd: will continue zantac 150 mg daily   6. Depression: she is stable and does receive benefit from zoloft 75 mg daily and will monitor   7. Peripheral neuropathy: is stable will continue neurontin 600 mg three times daily   8. CVA: is neurologically stable is presently  not on medications; will not make changes will monitor    9. Hypokalemia: will stop k+ at this time    10. Vascular dementia: no significant change in her status; her weight stable at 157 pounds; is presently not taking medications; will not make changes and will monitor      Ok Edwards NP [facility] Adult Medicine  Contact [phone] Monday through Friday 8am- 5pm  After hours call [phone]

## 2016-05-25 LAB — CBC AND DIFFERENTIAL
HEMATOCRIT: 35 % — AB (ref 36–46)
HEMOGLOBIN: 11.5 g/dL — AB (ref 12.0–16.0)
NEUTROS ABS: 4600 /uL
PLATELETS: 212 10*3/uL (ref 150–399)
WBC: 9.2 10^3/mL

## 2016-05-26 ENCOUNTER — Non-Acute Institutional Stay (SKILLED_NURSING_FACILITY): Payer: Medicare Other | Admitting: Adult Health

## 2016-05-26 DIAGNOSIS — G9520 Unspecified cord compression: Secondary | ICD-10-CM | POA: Diagnosis not present

## 2016-05-26 DIAGNOSIS — I639 Cerebral infarction, unspecified: Secondary | ICD-10-CM | POA: Diagnosis not present

## 2016-05-26 DIAGNOSIS — F3289 Other specified depressive episodes: Secondary | ICD-10-CM | POA: Diagnosis not present

## 2016-05-26 DIAGNOSIS — E876 Hypokalemia: Secondary | ICD-10-CM

## 2016-05-26 DIAGNOSIS — G6289 Other specified polyneuropathies: Secondary | ICD-10-CM

## 2016-05-26 DIAGNOSIS — G894 Chronic pain syndrome: Secondary | ICD-10-CM

## 2016-05-26 DIAGNOSIS — F0150 Vascular dementia without behavioral disturbance: Secondary | ICD-10-CM | POA: Diagnosis not present

## 2016-05-26 DIAGNOSIS — D508 Other iron deficiency anemias: Secondary | ICD-10-CM

## 2016-05-26 DIAGNOSIS — I10 Essential (primary) hypertension: Secondary | ICD-10-CM | POA: Diagnosis not present

## 2016-06-27 ENCOUNTER — Non-Acute Institutional Stay (SKILLED_NURSING_FACILITY): Payer: Medicare Other | Admitting: Internal Medicine

## 2016-06-27 ENCOUNTER — Encounter: Payer: Self-pay | Admitting: Internal Medicine

## 2016-06-27 DIAGNOSIS — I10 Essential (primary) hypertension: Secondary | ICD-10-CM | POA: Diagnosis not present

## 2016-06-27 DIAGNOSIS — G894 Chronic pain syndrome: Secondary | ICD-10-CM

## 2016-06-27 DIAGNOSIS — G6289 Other specified polyneuropathies: Secondary | ICD-10-CM

## 2016-06-27 DIAGNOSIS — F0150 Vascular dementia without behavioral disturbance: Secondary | ICD-10-CM | POA: Diagnosis not present

## 2016-06-27 DIAGNOSIS — Z8673 Personal history of transient ischemic attack (TIA), and cerebral infarction without residual deficits: Secondary | ICD-10-CM

## 2016-06-27 DIAGNOSIS — K5901 Slow transit constipation: Secondary | ICD-10-CM | POA: Diagnosis not present

## 2016-06-27 DIAGNOSIS — K219 Gastro-esophageal reflux disease without esophagitis: Secondary | ICD-10-CM | POA: Diagnosis not present

## 2016-06-27 DIAGNOSIS — M2550 Pain in unspecified joint: Secondary | ICD-10-CM | POA: Diagnosis not present

## 2016-06-27 NOTE — Progress Notes (Signed)
Patient ID: [name], female   DOB: January 21, 1925, 82 y.o.   MRN: [id number]    DATE:  06/27/2016  Location:     [facility] Room Number: 100 A Place of Service: SNF (31)   Extended Emergency Contact Information Primary Emergency Contact: Goheen,Thomas Address: [address]          [place], [place] [address] [place] Phone: [phone] Mobile Phone: [phone] Relation: Son Secondary Emergency Contact: Davis,Saranetti  [place] Phone: [phone] Relation: Other  Advanced Directive information  FULL CODE  Chief Complaint  Patient presents with  . Medical Management of Chronic Issues    Routine Visit    HPI:  82 yo female long term resident seen today for f/u. [name] diffuse joint pain. No falls. No nursing issues. She is a poor historian due to dementia. Hx obtained from chart.  Hypertension - stable on norvasc 5 mg daily   Chronic pain syndrome - due to spinal cord compression/OA. Currently takes  neurontin 600 mg three times daily; voltaren gel 4 gm to knees four times daily and 2 gm to right shoulder; robaxin 500 mg twice daily; vicodin  7.5/325 mg every 6 hours as needed      Hx Anemia - stable on iron daily. Hgb 11.5  Chronic Constipation - stable on miralax daily   GERD - stable on zantac 150 mg daily   Depression - stable on zoloft 75 mg daily  Peripheral neuropathy - stable on neurontin 600 mg three times daily   Hx CVA - stable  Hypokalemia - stable. K+ 4. She is off supplements  Vascular dementia - stable. Weight is 158 pounds. She does not take any meds for cognition    Past Medical History:  Diagnosis Date  . Anemia   . Arthritis    b/l shoulder R>L  . Atrial tachycardia ([place])    echo 04/08/11: EF 55-60%, mild LVH, grade 1 diast dysfxn;    s/p RFCA with Dr. Lovena Le  04/09/11  . Cataracts, bilateral   . Chronic pain syndrome 04/09/2013   On chronic opioids. Cervical MRI 11/ 2012 : Diffuse  cervical spondylosis, Diffuse degenerative disc disease with multilevel foraminal stenosis. Possible T1 nerve root involvement. Lumbar MRI 2009 : prior fusion at L3-4 and degenerative disc disease Rotator cuff repair : Dr. Berenice Primas 7/07. Follows with Dr. Tamera Punt for possible shoulder surgery.  H/O lumbar spinal stenosis with steroid injections. Mo  . CVA (cerebral infarction) 04/09/2013   MRI 2005 : Multiple tiny old lacunar infarcts as well as scattered white matter small vessel ischemic disease. Pt asymptomatic and denies TIA / CVA hx.    . Depression   . Fecal occult blood test positive   . GERD (gastroesophageal reflux disease)   . Head ache   . Hemorrhoids   . History of blood transfusion 2014   anemia-   . Hyperkalemia 04/08/2011  . Hypertension   . Lumbar spinal stenosis    gets Spinal injections by Dr. Marlaine Hind   . Osteoporosis, unspecified 04/09/2013   DEXA 5/07 : T L forearm -2.9. L femur -0.6, R femur -1.4. [name]  . Postnasal drip   . Renal insufficiency, mild   . Restless leg syndrome   . Rotator cuff tear arthropathy    03/22/13 dx [facility] (Dr. Tamera Punt) considering surgery  . Rupture of rotator cuff of shoulder    s/p repair by Dr. Berenice Primas  7/07. Follows with Dr. Tamera Punt for possible shoulder surgery.   . Stroke ([place])   . SVT (supraventricular tachycardia) ([place])   . Unspecified deficiency anemia     Past Surgical History:  Procedure Laterality Date  . ABDOMINAL HYSTERECTOMY    . BACK SURGERY     lumbar x 2, 2000 and 2004 by Dr. Deri Fuelling.   Marland Kitchen DOPPLER ECHOCARDIOGRAPHY  2004  . EYE SURGERY Bilateral    cataract  . LUMBAR LAMINECTOMY/DECOMPRESSION MICRODISCECTOMY Left 08/06/2013   Procedure: Left Thoracic eleven-twelve microdiskectomy ;  Surgeon: Charlie Pitter, MD;  Location: [facility];  Service: Neurosurgery;  Laterality: Left;  Left Thoracic eleven-twelve microdiskectomy   . ORIF FINGER FRACTURE Left    ring finger  . ROTATOR CUFF  REPAIR     right    Patient Care Team: [name], DO as PCP - General (Internal Medicine) Juanita Craver, MD as Attending Physician (Gastroenterology) Gerlene Fee, NP as Nurse Practitioner (Geriatric Medicine) [facility] ([place])  Social History   Social History  . Marital status: Single    Spouse name: N/A  . Number of children: 1  . Years of education: N/A   Occupational History  .  Retired   Social History Main Topics  . Smoking status: Never Smoker  . Smokeless tobacco: Never Used  . Alcohol use No  . Drug use: No  . Sexual activity: Not on file   Other Topics Concern  . Not on file   Social History Narrative   Originally from [place]. She has been here in the [place] since the 60's.   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        reports that she has never smoked. She has never used smokeless tobacco. She reports that she does not drink alcohol or use drugs.  Family History  Problem Relation Age of Onset  . Coronary artery disease Neg Hx     premature   No family status information on file.    Immunization History  Administered Date(s) Administered  . Influenza Split 07/11/2011, 10/20/2011, 07/30/2012  . Influenza Whole 08/16/2007, 06/09/2010  . Influenza,inj,Quad PF,36+ Mos 06/27/2013  . Influenza-Unspecified 07/23/2014, 07/03/2015  . PPD Test 08/12/2013  . Pneumococcal Polysaccharide-23 07/11/2011  . Pneumococcal-Unspecified 07/28/2014  . Td 06/18/2009  . Tdap 06/28/2014    Allergies  Allergen Reactions  . Codeine     Hallucinations  . Ibuprofen     Confusion    Medications: Patient's Medications  New Prescriptions   No medications on file  Previous Medications   AMLODIPINE (NORVASC) 5 MG TABLET    Take 1 tablet (5 mg total) by mouth daily.   CALCIUM PO    Take 600 mg by mouth 2 (two) times  daily.    CARBOXYMETHYLCELLULOSE (REFRESH PLUS) 0.5 % SOLN    Place 1 drop into the right eye at bedtime.    DICLOFENAC SODIUM (VOLTAREN) 1 % GEL    Apply 4 g topically 4 (four) times daily. Bilateral knees And 2 gm to right shoulder   FERROUS SULFATE 325 (65 FE) MG TABLET    Take 325 mg by mouth daily with breakfast.   GABAPENTIN (NEURONTIN) 600 MG TABLET    Take 600 mg by mouth 3 (three) times daily.   LOPERAMIDE (IMODIUM) 2 MG CAPSULE  Take 2 mg by mouth every 6 (six) hours as needed for diarrhea or loose stools.    MENTHOL, TOPICAL ANALGESIC, (BIOFREEZE) 4 % GEL    Apply topically. Apply to both knees three times daily, apply to right shoulder three times daily   METHOCARBAMOL (ROBAXIN) 500 MG TABLET    Take 500 mg by mouth 2 (two) times daily.   POLYETHYLENE GLYCOL (MIRALAX / GLYCOLAX) PACKET    Take 17 g by mouth daily.   RANITIDINE (ZANTAC) 150 MG TABLET    Take 150 mg by mouth at bedtime.   SERTRALINE (ZOLOFT) 25 MG TABLET    Take 25 mg by mouth daily. Give with 50 mg tab to equal 75 mg   SERTRALINE (ZOLOFT) 50 MG TABLET    Take 50 mg by mouth daily. Give with 25 mg tab to equal 75 mg total.  Modified Medications   No medications on file  Discontinued Medications   HYDROCODONE-ACETAMINOPHEN (NORCO) 7.5-325 MG TABLET    Take 1 tablet by mouth every 6 (six) hours as needed for moderate pain.    MENTHOL-METHYL SALICYLATE (BENGAY GREASELESS EX)    Apply 1 application topically 2 (two) times daily as needed. To hands   POTASSIUM CHLORIDE (KLOR-CON 10) 10 MEQ TABLET    Take 10 mEq by mouth daily.    Review of Systems  Unable to perform ROS: Dementia    Vitals:   06/27/16 1507  BP: 138/78  Pulse: 79  Resp: 18  Temp: 97.8 F (36.6 C)  TempSrc: Oral  SpO2: 95%  Weight: 158 lb (71.7 kg)  Height: 5\' 2"  (1.575 m)   Body mass index is 28.9 kg/m.  Physical Exam  Constitutional: She appears well-developed.  Frail appearing, sitting up in bed in NAD  HENT:  Mouth/Throat:  Oropharynx is clear and moist. No oropharyngeal exudate.  Eyes: Pupils are equal, round, and reactive to light. No scleral icterus.  Neck: Neck supple. Carotid bruit is not present. No tracheal deviation present. No thyromegaly present.  Cardiovascular: Normal rate, regular rhythm and intact distal pulses.  Exam reveals no gallop and no friction rub.   Murmur (1/6 SEM) heard. No LE edema b/l. no calf TTP.   Pulmonary/Chest: Effort normal and breath sounds normal. No stridor. No respiratory distress. She has no wheezes. She has no rales.  Abdominal: Soft. Bowel sounds are normal. She exhibits distension. She exhibits no mass. There is no hepatomegaly. There is no tenderness. There is no rebound and no guarding.  Musculoskeletal: She exhibits edema (L>R knee swelling with reduced ROM; DIP/PIP joint swelling).  Lymphadenopathy:    She has no cervical adenopathy.  Neurological: She is alert.  Skin: Skin is warm and dry. No rash noted.  Psychiatric: She has a normal mood and affect. Her behavior is normal.     Labs reviewed: Nursing Home on 04/25/2016  Component Date Value Ref Range Status  . HM Diabetic Eye Exam 04/19/2016 No Retinopathy  No Retinopathy Final  . HM Diabetic Foot Exam 12/04/2015 Completed   Final  . Potassium 04/25/2016 4.0  mmol/L Final    No results found.   Assessment/Plan   ICD-9-CM ICD-10-CM   1. Chronic pain syndrome 338.4 G89.4   2. Other polyneuropathy ([place]) 357.89 G62.89   3. Pain in joint involving multiple sites 719.49 M25.50   4. Vascular dementia without behavioral disturbance 290.40 F01.50   5. History of stroke V12.54 Z86.73   6. Slow transit constipation 564.01 K59.01   7. Essential  hypertension 401.9 I10   8. Gastroesophageal reflux disease without esophagitis 530.81 K21.9     Cont current meds as ordered  PT/OT/ST as indicated  Nutritional supplements as indicated  Will follow  Brittannie Tawney S. Perlie Gold  [facility] [address] [place], [place] [address] [phone] Cell (Monday-Friday 8 AM - 5 PM) [phone] After 5 PM and follow prompts

## 2016-07-11 ENCOUNTER — Encounter: Payer: Self-pay | Admitting: Adult Health

## 2016-07-11 NOTE — Progress Notes (Signed)
Location:   Psychiatrist of Service:  SNF (31)   CODE STATUS: full code   Allergies  Allergen Reactions  . Codeine     Hallucinations  . Ibuprofen     Confusion    Chief Complaint  Patient presents with  . Medical Management of Chronic Issues    HPI:  She is a long term resident of this facility being seen for the management of her chronic illnesses. Overall her status is stable; she is unable to fully participate but did tell me that she is feeling good. There are no nursing concerns at this time.   Past Medical History:  Diagnosis Date  . Anemia   . Arthritis    b/l shoulder R>L  . Atrial tachycardia ([place])    echo 04/08/11: EF 55-60%, mild LVH, grade 1 diast dysfxn;    s/p RFCA with Dr. Lovena Le  04/09/11  . Cataracts, bilateral   . Chronic pain syndrome 04/09/2013   On chronic opioids. Cervical MRI 11/ 2012 : Diffuse cervical spondylosis, Diffuse degenerative disc disease with multilevel foraminal stenosis. Possible T1 nerve root involvement. Lumbar MRI 2009 : prior fusion at L3-4 and degenerative disc disease Rotator cuff repair : Dr. Berenice Primas 7/07. Follows with Dr. Tamera Punt for possible shoulder surgery.  H/O lumbar spinal stenosis with steroid injections. Mo  . CVA (cerebral infarction) 04/09/2013   MRI 2005 : Multiple tiny old lacunar infarcts as well as scattered white matter small vessel ischemic disease. Pt asymptomatic and denies TIA / CVA hx.    . Depression   . Fecal occult blood test positive   . GERD (gastroesophageal reflux disease)   . Head ache   . Hemorrhoids   . History of blood transfusion 2014   anemia-   . Hyperkalemia 04/08/2011  . Hypertension   . Lumbar spinal stenosis    gets Spinal injections by Dr. Marlaine Hind   . Osteoporosis, unspecified 04/09/2013   DEXA 5/07 : T L forearm -2.9. L femur -0.6, R femur -1.4. [name]  . Postnasal drip   . Renal insufficiency, mild   . Restless leg syndrome   . Rotator cuff tear  arthropathy    03/22/13 dx [facility] (Dr. Tamera Punt) considering surgery  . Rupture of rotator cuff of shoulder    s/p repair by Dr. Berenice Primas 7/07. Follows with Dr. Tamera Punt for possible shoulder surgery.   . Stroke ([place])   . SVT (supraventricular tachycardia) ([place])   . Unspecified deficiency anemia     Past Surgical History:  Procedure Laterality Date  . ABDOMINAL HYSTERECTOMY    . BACK SURGERY     lumbar x 2, 2000 and 2004 by Dr. Deri Fuelling.   Marland Kitchen DOPPLER ECHOCARDIOGRAPHY  2004  . EYE SURGERY Bilateral    cataract  . LUMBAR LAMINECTOMY/DECOMPRESSION MICRODISCECTOMY Left 08/06/2013   Procedure: Left Thoracic eleven-twelve microdiskectomy ;  Surgeon: Charlie Pitter, MD;  Location: [facility];  Service: Neurosurgery;  Laterality: Left;  Left Thoracic eleven-twelve microdiskectomy   . ORIF FINGER FRACTURE Left    ring finger  . ROTATOR CUFF REPAIR     right    Social History   Social History  . Marital status: Single    Spouse name: N/A  . Number of children: 1  . Years of education: N/A   Occupational History  .  Retired   Social History Main Topics  . Smoking status: Never Smoker  . Smokeless  tobacco: Never Used  . Alcohol use No  . Drug use: No  . Sexual activity: Not on file   Other Topics Concern  . Not on file   Social History Narrative   Originally from [place]. She has been here in the [place] since the 60's.   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      Family History  Problem Relation Age of Onset  . Coronary artery disease Neg Hx     premature    VITAL SIGNS: b/p 140/78; P 82; wt 159.6 pounds; ht 62'; 97%  Patient's Medications  New Prescriptions   No medications on file  Previous Medications   AMLODIPINE (NORVASC) 5 MG TABLET    Take 1 tablet (5 mg total) by mouth daily.   CALCIUM PO    Take 600 mg by mouth 2 (two) times daily.     CARBOXYMETHYLCELLULOSE (REFRESH PLUS) 0.5 % SOLN    Place 1 drop into the right eye at bedtime.    DICLOFENAC SODIUM (VOLTAREN) 1 % GEL    Apply 4 g topically 4 (four) times daily. Bilateral knees And 2 gm to right shoulder   FERROUS SULFATE 325 (65 FE) MG TABLET    Take 325 mg by mouth daily with breakfast.   GABAPENTIN (NEURONTIN) 600 MG TABLET    Take 600 mg by mouth 3 (three) times daily.   LOPERAMIDE (IMODIUM) 2 MG CAPSULE    Take 2 mg by mouth every 6 (six) hours as needed for diarrhea or loose stools.    MENTHOL, TOPICAL ANALGESIC, (BIOFREEZE) 4 % GEL    Apply topically. Apply to both knees three times daily, apply to right shoulder three times daily   METHOCARBAMOL (ROBAXIN) 500 MG TABLET    Take 500 mg by mouth 2 (two) times daily.   POLYETHYLENE GLYCOL (MIRALAX / GLYCOLAX) PACKET    Take 17 g by mouth daily.   RANITIDINE (ZANTAC) 150 MG TABLET    Take 150 mg by mouth at bedtime.   SERTRALINE (ZOLOFT) 25 MG TABLET    Take 25 mg by mouth daily. Give with 50 mg tab to equal 75 mg   SERTRALINE (ZOLOFT) 50 MG TABLET    Take 50 mg by mouth daily. Give with 25 mg tab to equal 75 mg total.  Modified Medications   No medications on file  Discontinued Medications   No medications on file     SIGNIFICANT DIAGNOSTIC EXAMS    10-24-14: -d echo: - Left ventricle: The cavity size was normal. There was mild focal basal hypertrophy of the septum. Systolic function was normal. The estimated ejection fraction was in the range of 55% to 60%. Wall motion was normal; there were no regional wall motion abnormalities. Features are consistent with a pseudonormal left ventricular filling pattern, with concomitant abnormal relaxation and increased filling pressure (grade 2 diastolic dysfunction). - Aortic valve: There was mild stenosis.  - Mitral valve: There was moderate regurgitation. - Left atrium: The atrium was moderately dilated. - Atrial septum: There was a possible patent foramen ovale. - Tricuspid  valve: There was moderate regurgitation. - Pulmonary arteries: Systolic pressure was severely increased. PA peak pressure: 86 mm Hg (S).   10-29-14: right ankle x-ray: 1. No acute bony findings. 2. Bony demineralization. 3. Degenerative findings along the posterior subtalar facet. 4. Vascular  calcifications.  10-29-14: left ankle x-ray: 1. No fracture or acute bony findings identified. 2. Sensitivity is significantly reduced due to the severity of bony demineralization. If there is a high index of suspicion of occult bony injury, MRI of the ankle would be suggested  12-17-15: kub: partial constipation   12-19-15: chest x-ray: 1. No focal pneumonia. 2. Pulmonary venous congestion      LABS REVIEWED:   08-21-15: wbc 7.6; hgb 11.3; hct 34.;3 mcv 91.7; plt 206; glucose 87; bun 19; creat 0.88; k+ 3.4; na++141; liver normal albumin 2.9 08-31-15: k+ 3.9  12-18-15: wbc 12.9; hgb 13.6; hct 41.8; mcv 93.5; plt 204; glucose 124 bun 21; creat 1.05; k+ 4.6; na++138; alk phos 142; ast 80 ;albumin 3.3 12-20-15:glcuose 94; bun 14; creat 0.83; k+ 3.8; na++144; liver normal albumin 2.9 12-21-15: wbc 13.5; hgb 11.8; hct 26.8; mcv 92.7; plt 172  12-24-15: wbc 9.0; hgb 12.0; hct 35.7; mcv 89.7. plt 213; glucose 124; bun 33; creat 1.27; k+ 4.2; na++138 04-08-16: k+ 4.0  04-27-16: k+ 3.7        Review of Systems Unable to perform ROS: Dementia     Physical Exam Constitutional: No distress.  Eyes: Conjunctivae are normal.  Neck: Neck supple. No JVD present. No thyromegaly present.  Cardiovascular: Normal rate, regular rhythm and intact distal pulses.   Respiratory: Effort normal and breath sounds normal. No respiratory distress. She has no wheezes.  GI: Soft. Bowel sounds are normal. She exhibits no distension. There is no tenderness.  Musculoskeletal: She exhibits no edema.  Able to move all extremities   Lymphadenopathy:    She has no cervical adenopathy.  Neurological: She is alert.  Skin: Skin is  warm and dry. She is not diaphoretic.  Psychiatric: She has a normal mood and affect.      ASSESSMENT/ PLAN:  1. Hypertension: will continue  norvasc 5 mg daily   2. Chronic pain syndrome:  Has spinal cord compression: will continue  neurontin 600 mg three times daily;   Will continue robaxin 500 mg twice daily  will continue  vicodin  7.5/325 mg every 6 hours as needed will start stop the bengay and will start biofreeze to right shoulder ad bilateral knees twice daily      3. Anemia:  hgb is 12.0 will continue iron  daily     4.  Constipation: will continue   miralax daily   5. Gerd: will continue zantac 150 mg daily   6. Depression: she is stable and does receive benefit from zoloft 75 mg daily and will monitor   7. Peripheral neuropathy: is stable will continue neurontin 600 mg three times daily   8. CVA: is neurologically stable is presently not on medications; will not make changes will monitor    9. Hypokalemia: k+ 3.7; is not on supplement   10. Vascular dementia: no significant change in her status; her weight stable at 159 pounds; is presently not taking medications; will not make changes and will monitor      Ok Edwards NP [facility]  Contact [phone] Monday through Friday 8am- 5pm  After hours call [phone]

## 2016-07-25 ENCOUNTER — Non-Acute Institutional Stay (SKILLED_NURSING_FACILITY): Payer: Medicare Other | Admitting: Adult Health

## 2016-07-25 ENCOUNTER — Encounter: Payer: Self-pay | Admitting: Adult Health

## 2016-07-25 DIAGNOSIS — D508 Other iron deficiency anemias: Secondary | ICD-10-CM

## 2016-07-25 DIAGNOSIS — K219 Gastro-esophageal reflux disease without esophagitis: Secondary | ICD-10-CM

## 2016-07-25 DIAGNOSIS — K900 Celiac disease: Secondary | ICD-10-CM

## 2016-07-25 DIAGNOSIS — E876 Hypokalemia: Secondary | ICD-10-CM | POA: Diagnosis not present

## 2016-07-25 DIAGNOSIS — Z8673 Personal history of transient ischemic attack (TIA), and cerebral infarction without residual deficits: Secondary | ICD-10-CM

## 2016-07-25 DIAGNOSIS — G894 Chronic pain syndrome: Secondary | ICD-10-CM

## 2016-07-25 DIAGNOSIS — F0150 Vascular dementia without behavioral disturbance: Secondary | ICD-10-CM

## 2016-07-25 DIAGNOSIS — I10 Essential (primary) hypertension: Secondary | ICD-10-CM | POA: Diagnosis not present

## 2016-07-25 DIAGNOSIS — G6289 Other specified polyneuropathies: Secondary | ICD-10-CM

## 2016-07-25 NOTE — Progress Notes (Signed)
Patient ID: [name], female   DOB: March 14, 1925, 82 y.o.   MRN: [id number]   Location:   [place] Room Number: 100-A Place of Service:  SNF (31)   CODE STATUS: Full Code  Allergies  Allergen Reactions  . Codeine     Hallucinations  . Ibuprofen     Confusion    Chief Complaint  Patient presents with  . Medical Management of Chronic Issues    Follow up    HPI:  She is a long term resident of this facility being seen for the management of her chronic illnesses. Overall there is little change in her status. She odes get out of bed on a daily basis. She is unable to fully participate in the hpi or ros; but did tell me that she is feeling good. There are no nursing concerns at this time.    Past Medical History:  Diagnosis Date  . Anemia   . Arthritis    b/l shoulder R>L  . Atrial tachycardia ([place])    echo 04/08/11: EF 55-60%, mild LVH, grade 1 diast dysfxn;    s/p RFCA with Dr. Lovena Le  04/09/11  . Cataracts, bilateral   . Chronic pain syndrome 04/09/2013   On chronic opioids. Cervical MRI 11/ 2012 : Diffuse cervical spondylosis, Diffuse degenerative disc disease with multilevel foraminal stenosis. Possible T1 nerve root involvement. Lumbar MRI 2009 : prior fusion at L3-4 and degenerative disc disease Rotator cuff repair : Dr. Berenice Primas 7/07. Follows with Dr. Tamera Punt for possible shoulder surgery.  H/O lumbar spinal stenosis with steroid injections. Mo  . CVA (cerebral infarction) 04/09/2013   MRI 2005 : Multiple tiny old lacunar infarcts as well as scattered white matter small vessel ischemic disease. Pt asymptomatic and denies TIA / CVA hx.    . Depression   . Fecal occult blood test positive   . GERD (gastroesophageal reflux disease)   . Head ache   . Hemorrhoids   . History of blood transfusion 2014   anemia-   . Hyperkalemia 04/08/2011  . Hypertension   . Lumbar spinal stenosis    gets Spinal injections by Dr. Marlaine Hind   . Osteoporosis, unspecified  04/09/2013   DEXA 5/07 : T L forearm -2.9. L femur -0.6, R femur -1.4. [name]  . Postnasal drip   . Renal insufficiency, mild   . Restless leg syndrome   . Rotator cuff tear arthropathy    03/22/13 dx [facility] (Dr. Tamera Punt) considering surgery  . Rupture of rotator cuff of shoulder    s/p repair by Dr. Berenice Primas 7/07. Follows with Dr. Tamera Punt for possible shoulder surgery.   . Stroke ([place])   . SVT (supraventricular tachycardia) ([place])   . Unspecified deficiency anemia     Past Surgical History:  Procedure Laterality Date  . ABDOMINAL HYSTERECTOMY    . BACK SURGERY     lumbar x 2, 2000 and 2004 by Dr. Deri Fuelling.   Marland Kitchen DOPPLER ECHOCARDIOGRAPHY  2004  . EYE SURGERY Bilateral    cataract  . LUMBAR LAMINECTOMY/DECOMPRESSION MICRODISCECTOMY Left 08/06/2013   Procedure: Left Thoracic eleven-twelve microdiskectomy ;  Surgeon: Charlie Pitter, MD;  Location: [place] [facility];  Service: Neurosurgery;  Laterality: Left;  Left Thoracic eleven-twelve microdiskectomy   . ORIF FINGER FRACTURE Left    ring finger  . ROTATOR CUFF REPAIR     right    Social History   Social History  . Marital status:  Single    Spouse name: N/A  . Number of children: 1  . Years of education: N/A   Occupational History  .  Retired   Social History Main Topics  . Smoking status: Never Smoker  . Smokeless tobacco: Never Used  . Alcohol use No  . Drug use: No  . Sexual activity: Not on file   Other Topics Concern  . Not on file   Social History Narrative   Originally from [place]. She has been here in the [place] since the 60's.   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      Family History  Problem Relation Age of Onset  . Coronary artery disease Neg Hx     premature      VITAL SIGNS BP (!) 148/75   Pulse 79   Temp 97.6 F (36.4 C) (Oral)   Resp 18   Ht 5' 2"  (1.575  m)   Wt 156 lb 7 oz (71 kg)   SpO2 97%   BMI 28.61 kg/m   Patient's Medications  New Prescriptions   No medications on file  Previous Medications   AMLODIPINE (NORVASC) 5 MG TABLET    Take 1 tablet (5 mg total) by mouth daily.   CALCIUM PO    Take 600 mg by mouth 2 (two) times daily.    CARBOXYMETHYLCELLULOSE (REFRESH PLUS) 0.5 % SOLN    Place 1 drop into the right eye at bedtime.    FERROUS SULFATE 325 (65 FE) MG TABLET    Take 325 mg by mouth daily with breakfast.   GABAPENTIN (NEURONTIN) 600 MG TABLET    Take 600 mg by mouth 3 (three) times daily.   LOPERAMIDE (IMODIUM) 2 MG CAPSULE    Take 2 mg by mouth every 6 (six) hours as needed for diarrhea or loose stools.    MENTHOL, TOPICAL ANALGESIC, (BIOFREEZE) 4 % GEL    Apply topically. Apply to both knees three times daily, apply to right shoulder three times daily   METHOCARBAMOL (ROBAXIN) 500 MG TABLET    Take 500 mg by mouth 2 (two) times daily.   POLYETHYLENE GLYCOL (MIRALAX / GLYCOLAX) PACKET    Take 17 g by mouth daily.   RANITIDINE (ZANTAC) 150 MG TABLET    Take 150 mg by mouth at bedtime.   SERTRALINE (ZOLOFT) 25 MG TABLET    Take 25 mg by mouth daily. Give with 50 mg tab to equal 75 mg   SERTRALINE (ZOLOFT) 50 MG TABLET    Take 50 mg by mouth daily. Give with 25 mg tab to equal 75 mg total.  Modified Medications   No medications on file  Discontinued Medications   DICLOFENAC SODIUM (VOLTAREN) 1 % GEL    Apply 4 g topically 4 (four) times daily. Bilateral knees And 2 gm to right shoulder     SIGNIFICANT DIAGNOSTIC EXAMS  10-24-14: -d echo: - Left ventricle: The cavity size was normal. There was mild focal basal hypertrophy of the septum. Systolic function was normal. The estimated ejection fraction was in the range of 55% to 60%. Wall motion was normal; there were no regional wall motion abnormalities. Features are consistent with a pseudonormal left ventricular filling pattern, with concomitant abnormal relaxation and  increased filling pressure (grade 2 diastolic dysfunction). - Aortic valve: There was mild stenosis.  -  Mitral valve: There was moderate regurgitation. - Left atrium: The atrium was moderately dilated. - Atrial septum: There was a possible patent foramen ovale. - Tricuspid valve: There was moderate regurgitation. - Pulmonary arteries: Systolic pressure was severely increased. PA peak pressure: 86 mm Hg (S).   12-19-15: chest x-ray: 1. No focal pneumonia. 2. Pulmonary venous congestion      LABS REVIEWED:   08-21-15: wbc 7.6; hgb 11.3; hct 34.;3 mcv 91.7; plt 206; glucose 87; bun 19; creat 0.88; k+ 3.4; na++141; liver normal albumin 2.9 08-31-15: k+ 3.9  12-18-15: wbc 12.9; hgb 13.6; hct 41.8; mcv 93.5; plt 204; glucose 124 bun 21; creat 1.05; k+ 4.6; na++138; alk phos 142; ast 80 ;albumin 3.3 12-20-15:glcuose 94; bun 14; creat 0.83; k+ 3.8; na++144; liver normal albumin 2.9 12-21-15: wbc 13.5; hgb 11.8; hct 26.8; mcv 92.7; plt 172  12-24-15: wbc 9.0; hgb 12.0; hct 35.7; mcv 89.7. plt 213; glucose 124; bun 33; creat 1.27; k+ 4.2; na++138 04-08-16: k+ 4.0  04-27-16: k+ 3.7  05-25-16: wbc 9.2; hgb 11.5; hct 35.3; mcv 92.4; plt 212       Review of Systems Unable to perform ROS: Dementia     Physical Exam Constitutional: No distress.  Eyes: Conjunctivae are normal.  Neck: Neck supple. No JVD present. No thyromegaly present.  Cardiovascular: Normal rate, regular rhythm and intact distal pulses.   Respiratory: Effort normal and breath sounds normal. No respiratory distress. She has no wheezes.  GI: Soft. Bowel sounds are normal. She exhibits no distension. There is no tenderness.  Musculoskeletal: She exhibits no edema.  Able to move all extremities   Lymphadenopathy:    She has no cervical adenopathy.  Neurological: She is alert.  Skin: Skin is warm and dry. She is not diaphoretic.  Psychiatric: She has a normal mood and affect.      ASSESSMENT/ PLAN:  1. Hypertension: will  continue  norvasc 5 mg daily   2. Chronic pain syndrome:  Has spinal cord compression: will continue  neurontin 600 mg three times daily;   Will continue robaxin 500 mg twice daily  biofreeze to right shoulder and bilateral knees twice daily      3. Anemia:  hgb is 11.5 will continue iron  daily     4.  Constipation: will continue   miralax daily   5. Gerd: has celiac disease will need to have a gluten free diet will continue zantac 150 mg daily   6. Depression: she is stable and does receive benefit from zoloft 75 mg daily and will monitor   7. Peripheral neuropathy: is stable will continue neurontin 600 mg three times daily   8. CVA: is neurologically stable is presently not on medications; will not make changes will monitor    9. Hypokalemia: k+ 3.7; is not on supplement   10. Vascular dementia: no significant change in her status; her weight stable at 156 pounds; is presently not taking medications; will not make changes and will monitor    Will check cmp       Ok Edwards NP [facility]  Contact [phone] Monday through Friday 8am- 5pm  After hours call [phone]

## 2016-07-26 LAB — BASIC METABOLIC PANEL
BUN: 21 mg/dL (ref 4–21)
BUN: 21 mg/dL (ref 4–21)
CREATININE: 1 mg/dL (ref 0.5–1.1)
Creatinine: 1 mg/dL (ref 0.5–1.1)
GLUCOSE: 92 mg/dL
GLUCOSE: 92 mg/dL
Potassium: 4.1 mmol/L (ref 3.4–5.3)
Potassium: 4.1 mmol/L (ref 3.4–5.3)
SODIUM: 144 mmol/L (ref 137–147)
Sodium: 144 mmol/L (ref 137–147)

## 2016-07-26 LAB — HEPATIC FUNCTION PANEL
ALK PHOS: 100 U/L (ref 25–125)
ALT: 10 U/L (ref 7–35)
ALT: 10 U/L (ref 7–35)
AST: 36 U/L — AB (ref 13–35)
AST: 36 U/L — AB (ref 13–35)
Alkaline Phosphatase: 100 U/L (ref 25–125)
BILIRUBIN, TOTAL: 0.3 mg/dL
Bilirubin, Total: 0.3 mg/dL

## 2016-08-24 ENCOUNTER — Non-Acute Institutional Stay (SKILLED_NURSING_FACILITY): Payer: Medicare Other | Admitting: Adult Health

## 2016-08-24 ENCOUNTER — Encounter: Payer: Self-pay | Admitting: Adult Health

## 2016-08-24 DIAGNOSIS — D508 Other iron deficiency anemias: Secondary | ICD-10-CM | POA: Diagnosis not present

## 2016-08-24 DIAGNOSIS — F0150 Vascular dementia without behavioral disturbance: Secondary | ICD-10-CM | POA: Diagnosis not present

## 2016-08-24 DIAGNOSIS — I10 Essential (primary) hypertension: Secondary | ICD-10-CM | POA: Diagnosis not present

## 2016-08-24 DIAGNOSIS — G9520 Unspecified cord compression: Secondary | ICD-10-CM | POA: Diagnosis not present

## 2016-08-24 DIAGNOSIS — K219 Gastro-esophageal reflux disease without esophagitis: Secondary | ICD-10-CM | POA: Diagnosis not present

## 2016-08-24 DIAGNOSIS — F3289 Other specified depressive episodes: Secondary | ICD-10-CM | POA: Diagnosis not present

## 2016-08-24 DIAGNOSIS — G894 Chronic pain syndrome: Secondary | ICD-10-CM | POA: Diagnosis not present

## 2016-08-24 DIAGNOSIS — G6289 Other specified polyneuropathies: Secondary | ICD-10-CM

## 2016-08-24 NOTE — Progress Notes (Signed)
Patient ID: [name], female   DOB: 1925-09-24, 82 y.o.   MRN: [id number]    Location:    [place] Room Number: 100-A Place of Service:  SNF (31)   CODE STATUS: Full Code  Allergies  Allergen Reactions  . Codeine     Hallucinations  . Ibuprofen     Confusion    Chief Complaint  Patient presents with  . Medical Management of Chronic Issues    Follow up    HPI:  She is a long term resident of this facility being seen for the management of her chronic illnesses. Overall her status is without change. She is unable to fully participate in the hpi or ros; but did tell me that she is feeling good. There are no nursing concerns at this time.    Past Medical History:  Diagnosis Date  . Anemia   . Arthritis    b/l shoulder R>L  . Atrial tachycardia ([place])    echo 04/08/11: EF 55-60%, mild LVH, grade 1 diast dysfxn;    s/p RFCA with Dr. Lovena Le  04/09/11  . Cataracts, bilateral   . Chronic pain syndrome 04/09/2013   On chronic opioids. Cervical MRI 11/ 2012 : Diffuse cervical spondylosis, Diffuse degenerative disc disease with multilevel foraminal stenosis. Possible T1 nerve root involvement. Lumbar MRI 2009 : prior fusion at L3-4 and degenerative disc disease Rotator cuff repair : Dr. Berenice Primas 7/07. Follows with Dr. Tamera Punt for possible shoulder surgery.  H/O lumbar spinal stenosis with steroid injections. Mo  . CVA (cerebral infarction) 04/09/2013   MRI 2005 : Multiple tiny old lacunar infarcts as well as scattered white matter small vessel ischemic disease. Pt asymptomatic and denies TIA / CVA hx.    . Depression   . Essential hypertension 05/15/2007   Controlled with single drug tx    . Fecal occult blood test positive   . GERD (gastroesophageal reflux disease)   . Head ache   . Hemorrhoids   . History of blood transfusion 2014   anemia-   . Hyperkalemia 04/08/2011  . Hypertension   . Lumbar spinal stenosis    gets Spinal injections by Dr. Marlaine Hind   .  Osteoporosis, unspecified 04/09/2013   DEXA 5/07 : T L forearm -2.9. L femur -0.6, R femur -1.4. [name]  . Postnasal drip   . Renal insufficiency, mild   . Restless leg syndrome   . Rotator cuff tear arthropathy    03/22/13 dx [facility] (Dr. Tamera Punt) considering surgery  . Rupture of rotator cuff of shoulder    s/p repair by Dr. Berenice Primas 7/07. Follows with Dr. Tamera Punt for possible shoulder surgery.   . Stroke ([place])   . SVT (supraventricular tachycardia) ([place])   . Unspecified deficiency anemia     Past Surgical History:  Procedure Laterality Date  . ABDOMINAL HYSTERECTOMY    . BACK SURGERY     lumbar x 2, 2000 and 2004 by Dr. Deri Fuelling.   Marland Kitchen DOPPLER ECHOCARDIOGRAPHY  2004  . EYE SURGERY Bilateral    cataract  . LUMBAR LAMINECTOMY/DECOMPRESSION MICRODISCECTOMY Left 08/06/2013   Procedure: Left Thoracic eleven-twelve microdiskectomy ;  Surgeon: Charlie Pitter, MD;  Location: [place] [facility];  Service: Neurosurgery;  Laterality: Left;  Left Thoracic eleven-twelve microdiskectomy   . ORIF FINGER FRACTURE Left    ring finger  . ROTATOR CUFF REPAIR     right    Social History   Social History  .  Marital status: Single    Spouse name: N/A  . Number of children: 1  . Years of education: N/A   Occupational History  .  Retired   Social History Main Topics  . Smoking status: Never Smoker  . Smokeless tobacco: Never Used  . Alcohol use No  . Drug use:     Types: Methamphetamines  . Sexual activity: Not on file   Other Topics Concern  . Not on file   Social History Narrative   Originally from [place]. She has been here in the [place] since the 60's.   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      Family History  Problem Relation Age of Onset  . Coronary artery disease Neg Hx     premature      VITAL SIGNS BP 139/66   Pulse 82   Temp 98.4 F  (36.9 C) (Oral)   Resp 18   Ht 5' 2" (1.575 m)   Wt 160 lb 2 oz (72.6 kg)   SpO2 95%   BMI 29.29 kg/m   Patient's Medications  New Prescriptions   No medications on file  Previous Medications   AMLODIPINE (NORVASC) 5 MG TABLET    Take 1 tablet (5 mg total) by mouth daily.   CALCIUM PO    Take 600 mg by mouth 2 (two) times daily.    CARBOXYMETHYLCELLULOSE (REFRESH PLUS) 0.5 % SOLN    Place 1 drop into the right eye at bedtime.    FERROUS SULFATE 325 (65 FE) MG TABLET    Take 325 mg by mouth daily with breakfast.   GABAPENTIN (NEURONTIN) 600 MG TABLET    Take 600 mg by mouth 3 (three) times daily.   LOPERAMIDE (IMODIUM) 2 MG CAPSULE    Take 2 mg by mouth every 6 (six) hours as needed for diarrhea or loose stools.    MENTHOL, TOPICAL ANALGESIC, (BIOFREEZE) 4 % GEL    Apply topically. Apply to both knees three times daily, apply to right shoulder three times daily   METHOCARBAMOL (ROBAXIN) 500 MG TABLET    Take 500 mg by mouth 2 (two) times daily.   POLYETHYLENE GLYCOL (MIRALAX / GLYCOLAX) PACKET    Take 17 g by mouth daily.   RANITIDINE (ZANTAC) 150 MG TABLET    Take 150 mg by mouth at bedtime.   SERTRALINE (ZOLOFT) 25 MG TABLET    Take 25 mg by mouth daily. Give with 50 mg tab to equal 75 mg   SERTRALINE (ZOLOFT) 50 MG TABLET    Take 50 mg by mouth daily. Give with 25 mg tab to equal 75 mg total.  Modified Medications   No medications on file  Discontinued Medications   No medications on file     SIGNIFICANT DIAGNOSTIC EXAMS  10-24-14: -d echo: - Left ventricle: The cavity size was normal. There was mild focal basal hypertrophy of the septum. Systolic function was normal. The estimated ejection fraction was in the range of 55% to 60%. Wall motion was normal; there were no regional wall motion abnormalities. Features are consistent with a pseudonormal left ventricular filling pattern, with concomitant abnormal relaxation and increased filling pressure (grade 2 diastolic dysfunction). -  Aortic valve: There was mild stenosis.  - Mitral valve: There was moderate regurgitation. - Left atrium: The atrium was moderately dilated. -  Atrial septum: There was a possible patent foramen ovale. - Tricuspid valve: There was moderate regurgitation. - Pulmonary arteries: Systolic pressure was severely increased. PA peak pressure: 86 mm Hg (S).   12-19-15: chest x-ray: 1. No focal pneumonia. 2. Pulmonary venous congestion      LABS REVIEWED:   08-21-15: wbc 7.6; hgb 11.3; hct 34.;3 mcv 91.7; plt 206; glucose 87; bun 19; creat 0.88; k+ 3.4; na++141; liver normal albumin 2.9 08-31-15: k+ 3.9  12-18-15: wbc 12.9; hgb 13.6; hct 41.8; mcv 93.5; plt 204; glucose 124 bun 21; creat 1.05; k+ 4.6; na++138; alk phos 142; ast 80 ;albumin 3.3 12-20-15:glcuose 94; bun 14; creat 0.83; k+ 3.8; na++144; liver normal albumin 2.9 12-21-15: wbc 13.5; hgb 11.8; hct 26.8; mcv 92.7; plt 172  12-24-15: wbc 9.0; hgb 12.0; hct 35.7; mcv 89.7. plt 213; glucose 124; bun 33; creat 1.27; k+ 4.2; na++138 04-08-16: k+ 4.0  04-27-16: k+ 3.7  05-25-16: wbc 9.2; hgb 11.5; hct 35.3; mcv 92.4; plt 212  07-26-16: glucose 92; bun 21.1; creat 0.95; k+ 4.1; na++ 144; liver normal albumin 3.0      Review of Systems Unable to perform ROS: Dementia     Physical Exam Constitutional: No distress.  Eyes: Conjunctivae are normal.  Neck: Neck supple. No JVD present. No thyromegaly present.  Cardiovascular: Normal rate, regular rhythm and intact distal pulses.   Respiratory: Effort normal and breath sounds normal. No respiratory distress. She has no wheezes.  GI: Soft. Bowel sounds are normal. She exhibits no distension. There is no tenderness.  Musculoskeletal: She exhibits no edema.  Able to move all extremities   Lymphadenopathy:    She has no cervical adenopathy.  Neurological: She is alert.  Skin: Skin is warm and dry. She is not diaphoretic.  Psychiatric: She has a normal mood and affect.      ASSESSMENT/  PLAN:  1. Hypertension: will continue  norvasc 5 mg daily   2. Chronic pain syndrome:  Has spinal cord compression: will continue  neurontin 600 mg three times daily;   Will continue robaxin 500 mg twice daily  biofreeze to right shoulder and bilateral knees twice daily      3. Anemia:  hgb is 11.5 will continue iron  daily     4.  Constipation: will continue   miralax daily   5. Gerd: has celiac disease will need to have a gluten free diet will continue zantac 150 mg daily   6. Depression: she is stable and does receive benefit from zoloft 75 mg daily and will monitor   7. Peripheral neuropathy: is stable will continue neurontin 600 mg three times daily   8. CVA: is neurologically stable is presently not on medications; will not make changes will monitor    9. Hypokalemia: k+ 4.1; is not on supplement   10. Vascular dementia: no significant change in her status; her weight stable at 156 pounds; is presently not taking medications; will not make changes and will monitor     Ok Edwards NP [facility]  Contact [phone] Monday through Friday 8am- 5pm  After hours call [phone]

## 2016-09-26 ENCOUNTER — Non-Acute Institutional Stay (SKILLED_NURSING_FACILITY): Payer: Medicare Other | Admitting: Adult Health

## 2016-09-26 ENCOUNTER — Encounter: Payer: Self-pay | Admitting: Adult Health

## 2016-09-26 DIAGNOSIS — D508 Other iron deficiency anemias: Secondary | ICD-10-CM

## 2016-09-26 DIAGNOSIS — E876 Hypokalemia: Secondary | ICD-10-CM | POA: Diagnosis not present

## 2016-09-26 DIAGNOSIS — I639 Cerebral infarction, unspecified: Secondary | ICD-10-CM

## 2016-09-26 DIAGNOSIS — K219 Gastro-esophageal reflux disease without esophagitis: Secondary | ICD-10-CM

## 2016-09-26 DIAGNOSIS — G894 Chronic pain syndrome: Secondary | ICD-10-CM | POA: Diagnosis not present

## 2016-09-26 DIAGNOSIS — F0150 Vascular dementia without behavioral disturbance: Secondary | ICD-10-CM | POA: Diagnosis not present

## 2016-09-26 DIAGNOSIS — I10 Essential (primary) hypertension: Secondary | ICD-10-CM

## 2016-09-26 NOTE — Progress Notes (Signed)
Patient ID: [name], female   DOB: 1924-10-22, 82 y.o.   MRN: [id number]   Location:    [place] Room Number: 100-A Place of Service:  SNF (31)   CODE STATUS: Full Code  Allergies  Allergen Reactions  . Codeine     Hallucinations  . Ibuprofen     Confusion    Chief Complaint  Patient presents with  . Medical Management of Chronic Issues    Follow up    HPI:  She is a long term resident of this facility is being seen for the medical management of her chronic illnesses. She tells me that she is doing well. She is not voicing any concerns today. She does get out of bed daily. There are no nursing concerns at this time.    Past Medical History:  Diagnosis Date  . Anemia   . Arthritis    b/l shoulder R>L  . Atrial tachycardia ([place])    echo 04/08/11: EF 55-60%, mild LVH, grade 1 diast dysfxn;    s/p RFCA with Dr. Lovena Le  04/09/11  . Cataracts, bilateral   . Chronic pain syndrome 04/09/2013   On chronic opioids. Cervical MRI 11/ 2012 : Diffuse cervical spondylosis, Diffuse degenerative disc disease with multilevel foraminal stenosis. Possible T1 nerve root involvement. Lumbar MRI 2009 : prior fusion at L3-4 and degenerative disc disease Rotator cuff repair : Dr. Berenice Primas 7/07. Follows with Dr. Tamera Punt for possible shoulder surgery.  H/O lumbar spinal stenosis with steroid injections. Mo  . CVA (cerebral infarction) 04/09/2013   MRI 2005 : Multiple tiny old lacunar infarcts as well as scattered white matter small vessel ischemic disease. Pt asymptomatic and denies TIA / CVA hx.    . Depression   . Essential hypertension 05/15/2007   Controlled with single drug tx    . Fecal occult blood test positive   . GERD (gastroesophageal reflux disease)   . Head ache   . Hemorrhoids   . History of blood transfusion 2014   anemia-   . Hyperkalemia 04/08/2011  . Hypertension   . Lumbar spinal stenosis    gets Spinal injections by Dr. Marlaine Hind   . Osteoporosis,  unspecified 04/09/2013   DEXA 5/07 : T L forearm -2.9. L femur -0.6, R femur -1.4. [name]  . Postnasal drip   . Renal insufficiency, mild   . Restless leg syndrome   . Rotator cuff tear arthropathy    03/22/13 dx [facility] (Dr. Tamera Punt) considering surgery  . Rupture of rotator cuff of shoulder    s/p repair by Dr. Berenice Primas 7/07. Follows with Dr. Tamera Punt for possible shoulder surgery.   . Stroke ([place])   . SVT (supraventricular tachycardia) ([place])   . Unspecified deficiency anemia     Past Surgical History:  Procedure Laterality Date  . ABDOMINAL HYSTERECTOMY    . BACK SURGERY     lumbar x 2, 2000 and 2004 by Dr. Deri Fuelling.   Marland Kitchen DOPPLER ECHOCARDIOGRAPHY  2004  . EYE SURGERY Bilateral    cataract  . LUMBAR LAMINECTOMY/DECOMPRESSION MICRODISCECTOMY Left 08/06/2013   Procedure: Left Thoracic eleven-twelve microdiskectomy ;  Surgeon: Charlie Pitter, MD;  Location: [place] [facility];  Service: Neurosurgery;  Laterality: Left;  Left Thoracic eleven-twelve microdiskectomy   . ORIF FINGER FRACTURE Left    ring finger  . ROTATOR CUFF REPAIR     right    Social History   Social History  . Marital  status: Single    Spouse name: N/A  . Number of children: 1  . Years of education: N/A   Occupational History  .  Retired   Social History Main Topics  . Smoking status: Never Smoker  . Smokeless tobacco: Never Used  . Alcohol use No  . Drug use:     Types: Methamphetamines  . Sexual activity: Not on file   Other Topics Concern  . Not on file   Social History Narrative   Originally from [place]. She has been here in the [place] since the 60's.   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      Family History  Problem Relation Age of Onset  . Coronary artery disease Neg Hx     premature      VITAL SIGNS BP (!) 142/70   Pulse 78   Temp 98 F (36.7 C)  (Oral)   Resp 18   Ht _0  (1.575 m)   Wt 159 lb (72.1 kg)   SpO2 98%   BMI 29.08 kg/m   Patient's Medications  New Prescriptions   No medications on file  Previous Medications   AMLODIPINE (NORVASC) 5 MG TABLET    Take 1 tablet (5 mg total) by mouth daily.   CALCIUM PO    Take 600 mg by mouth 2 (two) times daily.    CARBOXYMETHYLCELLULOSE (REFRESH PLUS) 0.5 % SOLN    Place 1 drop into the right eye at bedtime.    FERROUS SULFATE 325 (65 FE) MG TABLET    Take 325 mg by mouth daily with breakfast.   GABAPENTIN (NEURONTIN) 600 MG TABLET    Take 600 mg by mouth 3 (three) times daily.   LOPERAMIDE (IMODIUM) 2 MG CAPSULE    Take 2 mg by mouth every 6 (six) hours as needed for diarrhea or loose stools.    MENTHOL, TOPICAL ANALGESIC, (BIOFREEZE) 4 % GEL    Apply topically. Apply to both knees three times daily, apply to right shoulder three times daily   METHOCARBAMOL (ROBAXIN) 500 MG TABLET    Take 500 mg by mouth 2 (two) times daily.   POLYETHYLENE GLYCOL (MIRALAX / GLYCOLAX) PACKET    Take 17 g by mouth daily.   RANITIDINE (ZANTAC) 150 MG TABLET    Take 150 mg by mouth at bedtime.   SERTRALINE (ZOLOFT) 25 MG TABLET    Take 25 mg by mouth daily. Give with 50 mg tab to equal 75 mg   SERTRALINE (ZOLOFT) 50 MG TABLET    Take 50 mg by mouth daily. Give with 25 mg tab to equal 75 mg total.  Modified Medications   No medications on file  Discontinued Medications   No medications on file     SIGNIFICANT DIAGNOSTIC EXAMS  10-24-14: -d echo: - Left ventricle: The cavity size was normal. There was mild focal basal hypertrophy of the septum. Systolic function was normal. The estimated ejection fraction was in the range of 55% to 60%. Wall motion was normal; there were no regional wall motion abnormalities. Features are consistent with a pseudonormal left ventricular filling pattern, with concomitant abnormal relaxation and increased filling pressure (grade 2 diastolic dysfunction). - Aortic valve:  There was mild stenosis.  - Mitral valve: There was moderate regurgitation. - Left atrium: The atrium was moderately dilated. - Atrial  septum: There was a possible patent foramen ovale. - Tricuspid valve: There was moderate regurgitation. - Pulmonary arteries: Systolic pressure was severely increased. PA peak pressure: 86 mm Hg (S).   12-19-15: chest x-ray: 1. No focal pneumonia. 2. Pulmonary venous congestion      LABS REVIEWED:   12-18-15: wbc 12.9; hgb 13.6; hct 41.8; mcv 93.5; plt 204; glucose 124 bun 21; creat 1.05; k+ 4.6; na++138; alk phos 142; ast 80 ;albumin 3.3 12-20-15:glcuose 94; bun 14; creat 0.83; k+ 3.8; na++144; liver normal albumin 2.9 12-21-15: wbc 13.5; hgb 11.8; hct 26.8; mcv 92.7; plt 172  12-24-15: wbc 9.0; hgb 12.0; hct 35.7; mcv 89.7. plt 213; glucose 124; bun 33; creat 1.27; k+ 4.2; na++138 04-08-16: k+ 4.0  04-27-16: k+ 3.7  05-25-16: wbc 9.2; hgb 11.5; hct 35.3; mcv 92.4; plt 212  07-26-16: glucose 92; bun 21.1; creat 0.95; k+ 4.1; na++ 144; liver normal albumin 3.0      Review of Systems Unable to perform ROS: Dementia     Physical Exam Constitutional: No distress.  Eyes: Conjunctivae are normal.  Neck: Neck supple. No JVD present. No thyromegaly present.  Cardiovascular: Normal rate, regular rhythm and intact distal pulses.   Respiratory: Effort normal and breath sounds normal. No respiratory distress. She has no wheezes.  GI: Soft. Bowel sounds are normal. She exhibits no distension. There is no tenderness.  Musculoskeletal: She exhibits no edema.  Able to move all extremities   Lymphadenopathy:    She has no cervical adenopathy.  Neurological: She is alert.  Skin: Skin is warm and dry. She is not diaphoretic.  Psychiatric: She has a normal mood and affect.      ASSESSMENT/ PLAN:  1. Hypertension: will continue  norvasc 5 mg daily   2. Chronic pain syndrome:  Has spinal cord compression: will continue  neurontin 600 mg three times daily;    Will continue robaxin 500 mg twice daily  biofreeze to right shoulder and bilateral knees twice daily      3. Anemia:  hgb is 11.5 will continue iron  daily     4.  Constipation: will continue   miralax daily   5. Gerd: has celiac disease will need to have a gluten free diet will continue zantac 150 mg daily   6. Depression: she is stable and does receive benefit from zoloft 75 mg daily and will monitor   7. Peripheral neuropathy: is stable will continue neurontin 600 mg three times daily   8. CVA: is neurologically stable is presently not on medications; will not make changes will monitor    9. Hypokalemia: k+ 4.1; is not on supplement   10. Vascular dementia: no significant change in her status; her weight stable at 159 pounds; is presently not taking medications; will not make changes and will monitor    Ok Edwards NP [facility]  Contact [phone] Monday through Friday 8am- 5pm  After hours call [phone]

## 2016-11-04 ENCOUNTER — Non-Acute Institutional Stay (SKILLED_NURSING_FACILITY): Payer: Medicare Other | Admitting: Adult Health

## 2016-11-04 ENCOUNTER — Encounter: Payer: Self-pay | Admitting: Adult Health

## 2016-11-04 DIAGNOSIS — E876 Hypokalemia: Secondary | ICD-10-CM

## 2016-11-04 DIAGNOSIS — I10 Essential (primary) hypertension: Secondary | ICD-10-CM | POA: Diagnosis not present

## 2016-11-04 DIAGNOSIS — F0150 Vascular dementia without behavioral disturbance: Secondary | ICD-10-CM

## 2016-11-04 DIAGNOSIS — I639 Cerebral infarction, unspecified: Secondary | ICD-10-CM | POA: Diagnosis not present

## 2016-11-04 DIAGNOSIS — G894 Chronic pain syndrome: Secondary | ICD-10-CM

## 2016-11-04 DIAGNOSIS — K219 Gastro-esophageal reflux disease without esophagitis: Secondary | ICD-10-CM | POA: Diagnosis not present

## 2016-11-04 DIAGNOSIS — G6289 Other specified polyneuropathies: Secondary | ICD-10-CM

## 2016-11-04 NOTE — Progress Notes (Signed)
Location:  [place] Room Number: 100 A Place of Service:  SNF (31)   CODE STATUS: Full code  Allergies  Allergen Reactions  . Codeine     Hallucinations  . Ibuprofen     Confusion    Chief Complaint  Patient presents with  . Medical Management of Chronic Issues    Routine Visit    HPI:  She is a long term resident of this facility being seen for the management of her chronic illnesses. Overall her status is stable. She is telling me that she feels good; but cannot fully participate in the hpi or ros. There are no nursing concerns at this time.   Past Medical History:  Diagnosis Date  . Anemia   . Arthritis    b/l shoulder R>L  . Atrial tachycardia ([place])    echo 04/08/11: EF 55-60%, mild LVH, grade 1 diast dysfxn;    s/p RFCA with Dr. Lovena Le  04/09/11  . Cataracts, bilateral   . Chronic pain syndrome 04/09/2013   On chronic opioids. Cervical MRI 11/ 2012 : Diffuse cervical spondylosis, Diffuse degenerative disc disease with multilevel foraminal stenosis. Possible T1 nerve root involvement. Lumbar MRI 2009 : prior fusion at L3-4 and degenerative disc disease Rotator cuff repair : Dr. Berenice Primas 7/07. Follows with Dr. Tamera Punt for possible shoulder surgery.  H/O lumbar spinal stenosis with steroid injections. Mo  . CVA (cerebral infarction) 04/09/2013   MRI 2005 : Multiple tiny old lacunar infarcts as well as scattered white matter small vessel ischemic disease. Pt asymptomatic and denies TIA / CVA hx.    . Depression   . Essential hypertension 05/15/2007   Controlled with single drug tx    . Fecal occult blood test positive   . GERD (gastroesophageal reflux disease)   . Head ache   . Hemorrhoids   . History of blood transfusion 2014   anemia-   . Hyperkalemia 04/08/2011  . Hypertension   . Lumbar spinal stenosis    gets Spinal injections by Dr. Marlaine Hind   . Osteoporosis, unspecified 04/09/2013   DEXA 5/07 : T L forearm -2.9. L femur -0.6, R femur -1.4. Pt is a  high fall risk  . Postnasal drip   . Renal insufficiency, mild   . Restless leg syndrome   . Rotator cuff tear arthropathy    03/22/13 dx [facility] (Dr. Tamera Punt) considering surgery  . Rupture of rotator cuff of shoulder    s/p repair by Dr. Berenice Primas 7/07. Follows with Dr. Tamera Punt for possible shoulder surgery.   . Stroke ([place])   . SVT (supraventricular tachycardia) ([place])   . Unspecified deficiency anemia     Past Surgical History:  Procedure Laterality Date  . ABDOMINAL HYSTERECTOMY    . BACK SURGERY     lumbar x 2, 2000 and 2004 by Dr. Deri Fuelling.   Marland Kitchen DOPPLER ECHOCARDIOGRAPHY  2004  . EYE SURGERY Bilateral    cataract  . LUMBAR LAMINECTOMY/DECOMPRESSION MICRODISCECTOMY Left 08/06/2013   Procedure: Left Thoracic eleven-twelve microdiskectomy ;  Surgeon: Charlie Pitter, MD;  Location: [facility];  Service: Neurosurgery;  Laterality: Left;  Left Thoracic eleven-twelve microdiskectomy   . ORIF FINGER FRACTURE Left    ring finger  . ROTATOR CUFF REPAIR     right    Social History   Social History  . Marital status: Single    Spouse name: N/A  . Number of children: 1  . Years of education: N/A  Occupational History  .  Retired   Social History Main Topics  . Smoking status: Never Smoker  . Smokeless tobacco: Never Used  . Alcohol use No  . Drug use: Yes    Types: Methamphetamines  . Sexual activity: Not Currently   Other Topics Concern  . Not on file   Social History Narrative   Originally from [place]. She has been here in the [place] since the 60's.   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      Family History  Problem Relation Age of Onset  . Coronary artery disease Neg Hx     premature      VITAL SIGNS BP 137/64   Pulse 72   Temp 97.8 F (36.6 C)   Resp 18   Ht 5' 2"  (1.575 m)   Wt 161 lb (73 kg)   SpO2 96%   BMI 29.45 kg/m    Patient's Medications  New Prescriptions   No medications on file  Previous Medications   AMLODIPINE (NORVASC) 5 MG TABLET    Take 1 tablet (5 mg total) by mouth daily.   CALCIUM PO    Take 600 mg by mouth 2 (two) times daily.    CARBOXYMETHYLCELLULOSE (REFRESH PLUS) 0.5 % SOLN    Place 1 drop into the right eye at bedtime.    FERROUS SULFATE 325 (65 FE) MG TABLET    Take 325 mg by mouth daily with breakfast.   GABAPENTIN (NEURONTIN) 600 MG TABLET    Take 600 mg by mouth 3 (three) times daily.   LOPERAMIDE (IMODIUM) 2 MG CAPSULE    Take 2 mg by mouth every 6 (six) hours as needed for diarrhea or loose stools.    MENTHOL, TOPICAL ANALGESIC, (BIOFREEZE) 4 % GEL    Apply topically. Apply to both knees three times daily, apply to right shoulder three times daily   METHOCARBAMOL (ROBAXIN) 500 MG TABLET    Take 500 mg by mouth 2 (two) times daily.   MULTIPLE VITAMINS-MINERALS (DECUBI-VITE) CAPS    Take 1 capsule by mouth daily.   POLYETHYLENE GLYCOL (MIRALAX / GLYCOLAX) PACKET    Take 17 g by mouth daily.   RANITIDINE (ZANTAC) 150 MG TABLET    Take 150 mg by mouth at bedtime.   SERTRALINE (ZOLOFT) 25 MG TABLET    Take 25 mg by mouth daily. Give with 50 mg tab to equal 75 mg   SERTRALINE (ZOLOFT) 50 MG TABLET    Take 50 mg by mouth daily. Give with 25 mg tab to equal 75 mg total.  Modified Medications   No medications on file  Discontinued Medications   No medications on file     SIGNIFICANT DIAGNOSTIC EXAMS   10-24-14: -d echo: - Left ventricle: The cavity size was normal. There was mild focal basal hypertrophy of the septum. Systolic function was normal. The estimated ejection fraction was in the range of 55% to 60%. Wall motion was normal; there were no regional wall motion abnormalities. Features are consistent with a pseudonormal left ventricular filling pattern, with concomitant abnormal relaxation and increased filling pressure (grade 2 diastolic dysfunction). - Aortic valve: There was  mild stenosis.  - Mitral valve: There was moderate regurgitation. - Left atrium: The atrium was moderately dilated. - Atrial septum: There was a possible patent foramen ovale. -  Tricuspid valve: There was moderate regurgitation. - Pulmonary arteries: Systolic pressure was severely increased. PA peak pressure: 86 mm Hg (S).   12-19-15: chest x-ray: 1. No focal pneumonia. 2. Pulmonary venous congestion      LABS REVIEWED:   12-18-15: wbc 12.9; hgb 13.6; hct 41.8; mcv 93.5; plt 204; glucose 124 bun 21; creat 1.05; k+ 4.6; na++138; alk phos 142; ast 80 ;albumin 3.3 12-20-15:glcuose 94; bun 14; creat 0.83; k+ 3.8; na++144; liver normal albumin 2.9 12-21-15: wbc 13.5; hgb 11.8; hct 26.8; mcv 92.7; plt 172  12-24-15: wbc 9.0; hgb 12.0; hct 35.7; mcv 89.7. plt 213; glucose 124; bun 33; creat 1.27; k+ 4.2; na++138 04-08-16: k+ 4.0  04-27-16: k+ 3.7  05-25-16: wbc 9.2; hgb 11.5; hct 35.3; mcv 92.4; plt 212  07-26-16: glucose 92; bun 21.1; creat 0.95; k+ 4.1; na++ 144; liver normal albumin 3.0      Review of Systems Unable to perform ROS: Dementia     Physical Exam Constitutional: No distress.  Eyes: Conjunctivae are normal.  Neck: Neck supple. No JVD present. No thyromegaly present.  Cardiovascular: Normal rate, regular rhythm and intact distal pulses.   Respiratory: Effort normal and breath sounds normal. No respiratory distress. She has no wheezes.  GI: Soft. Bowel sounds are normal. She exhibits no distension. There is no tenderness.  Musculoskeletal: She exhibits no edema.  Able to move all extremities   Lymphadenopathy:    She has no cervical adenopathy.  Neurological: She is alert.  Skin: Skin is warm and dry. She is not diaphoretic.  Psychiatric: She has a normal mood and affect.      ASSESSMENT/ PLAN:  1. Hypertension: will continue  norvasc 5 mg daily   2. Chronic pain syndrome:  Has spinal cord compression: will continue  neurontin 600 mg three times daily;   Will  continue robaxin 500 mg twice daily  biofreeze to right shoulder and bilateral knees twice daily      3. Anemia:  hgb is 11.5 will continue iron  daily     4.  Constipation: will continue   miralax daily   5. Gerd: has celiac disease will need to have a gluten free diet will continue zantac 150 mg daily   6. Depression: she is stable and does receive benefit from zoloft 75 mg daily and will monitor   7. Peripheral neuropathy: is stable will continue neurontin 600 mg three times daily   8. CVA: is neurologically stable is presently not on medications; will not make changes will monitor    9. Hypokalemia: k+ 4.1; is not on supplement   10. Vascular dementia: no significant change in her status; her weight stable at 161 pounds; is presently not taking medications; will not make changes and will monitor         Ok Edwards NP [facility]  Contact [phone] Monday through Friday 8am- 5pm  After hours call [phone]

## 2016-12-05 ENCOUNTER — Encounter: Payer: Self-pay | Admitting: Adult Health

## 2016-12-05 ENCOUNTER — Non-Acute Institutional Stay (SKILLED_NURSING_FACILITY): Payer: Medicare Other | Admitting: Adult Health

## 2016-12-05 DIAGNOSIS — I639 Cerebral infarction, unspecified: Secondary | ICD-10-CM | POA: Diagnosis not present

## 2016-12-05 DIAGNOSIS — G6289 Other specified polyneuropathies: Secondary | ICD-10-CM

## 2016-12-05 DIAGNOSIS — I10 Essential (primary) hypertension: Secondary | ICD-10-CM | POA: Diagnosis not present

## 2016-12-05 DIAGNOSIS — G9520 Unspecified cord compression: Secondary | ICD-10-CM | POA: Diagnosis not present

## 2016-12-05 DIAGNOSIS — H409 Unspecified glaucoma: Secondary | ICD-10-CM

## 2016-12-05 DIAGNOSIS — F0150 Vascular dementia without behavioral disturbance: Secondary | ICD-10-CM | POA: Diagnosis not present

## 2016-12-05 DIAGNOSIS — G894 Chronic pain syndrome: Secondary | ICD-10-CM

## 2016-12-05 NOTE — Progress Notes (Signed)
Location:   [facility] Room Number: 100 A Place of Service:  SNF (31)   CODE STATUS: Full Code  Allergies  Allergen Reactions  . Codeine     Hallucinations  . Ibuprofen     Confusion    Chief Complaint  Patient presents with  . Medical Management of Chronic Issues    Routine Visit    HPI:  She is a long term resident of this facility being seen for the management of her chronic illnesses.  Overall her status is without change. She does get out of bed daily. She does complain of multiple joints pain. Staff reports that there are no indicators of pain present.  There are no nursing concerns at this time.    Past Medical History:  Diagnosis Date  . Anemia   . Arthritis    b/l shoulder R>L  . Atrial tachycardia ([place])    echo 04/08/11: EF 55-60%, mild LVH, grade 1 diast dysfxn;    s/p RFCA with Dr. Lovena Le  04/09/11  . Cataracts, bilateral   . Chronic pain syndrome 04/09/2013   On chronic opioids. Cervical MRI 11/ 2012 : Diffuse cervical spondylosis, Diffuse degenerative disc disease with multilevel foraminal stenosis. Possible T1 nerve root involvement. Lumbar MRI 2009 : prior fusion at L3-4 and degenerative disc disease Rotator cuff repair : Dr. Berenice Primas 7/07. Follows with Dr. Tamera Punt for possible shoulder surgery.  H/O lumbar spinal stenosis with steroid injections. Mo  . CVA (cerebral infarction) 04/09/2013   MRI 2005 : Multiple tiny old lacunar infarcts as well as scattered white matter small vessel ischemic disease. Pt asymptomatic and denies TIA / CVA hx.    . Depression   . Essential hypertension 05/15/2007   Controlled with single drug tx    . Fecal occult blood test positive   . GERD (gastroesophageal reflux disease)   . Head ache   . Hemorrhoids   . History of blood transfusion 2014   anemia-   . Hyperkalemia 04/08/2011  . Hypertension   . Lumbar spinal stenosis    gets Spinal injections by Dr. Marlaine Hind   . Osteoporosis, unspecified 04/09/2013   DEXA  5/07 : T L forearm -2.9. L femur -0.6, R femur -1.4. [name]  . Postnasal drip   . Renal insufficiency, mild   . Restless leg syndrome   . Rotator cuff tear arthropathy    03/22/13 dx [facility] (Dr. Tamera Punt) considering surgery  . Rupture of rotator cuff of shoulder    s/p repair by Dr. Berenice Primas 7/07. Follows with Dr. Tamera Punt for possible shoulder surgery.   . Stroke ([place])   . SVT (supraventricular tachycardia) ([place])   . Unspecified deficiency anemia     Past Surgical History:  Procedure Laterality Date  . ABDOMINAL HYSTERECTOMY    . BACK SURGERY     lumbar x 2, 2000 and 2004 by Dr. Deri Fuelling.   Marland Kitchen DOPPLER ECHOCARDIOGRAPHY  2004  . EYE SURGERY Bilateral    cataract  . LUMBAR LAMINECTOMY/DECOMPRESSION MICRODISCECTOMY Left 08/06/2013   Procedure: Left Thoracic eleven-twelve microdiskectomy ;  Surgeon: Charlie Pitter, MD;  Location: [facility];  Service: Neurosurgery;  Laterality: Left;  Left Thoracic eleven-twelve microdiskectomy   . ORIF FINGER FRACTURE Left    ring finger  . ROTATOR CUFF REPAIR     right    Social History   Social History  . Marital status: Single    Spouse name: N/A  .  Number of children: 1  . Years of education: N/A   Occupational History  .  Retired   Social History Main Topics  . Smoking status: Never Smoker  . Smokeless tobacco: Never Used  . Alcohol use No  . Drug use: Yes    Types: Methamphetamines  . Sexual activity: Not Currently   Other Topics Concern  . Not on file   Social History Narrative   Originally from [place]. She has been here in the [place] since the 60's.   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      Family History  Problem Relation Age of Onset  . Coronary artery disease Neg Hx     premature      VITAL SIGNS BP 130/86   Pulse 72   Temp 98.7 F (37.1 C)   Resp 18   Ht 5' 2"   (1.575 m)   Wt 159 lb 6.4 oz (72.3 kg)   SpO2 96%   BMI 29.15 kg/m   Patient's Medications  New Prescriptions   No medications on file  Previous Medications   AMLODIPINE (NORVASC) 5 MG TABLET    Take 1 tablet (5 mg total) by mouth daily.   CALCIUM PO    Take 600 mg by mouth 2 (two) times daily.    CARBOXYMETHYLCELLULOSE (REFRESH PLUS) 0.5 % SOLN    Place 1 drop into both eyes 2 (two) times daily.    FERROUS SULFATE 325 (65 FE) MG TABLET    Take 325 mg by mouth daily with breakfast.   GABAPENTIN (NEURONTIN) 600 MG TABLET    Take 600 mg by mouth 3 (three) times daily.   LOPERAMIDE (IMODIUM) 2 MG CAPSULE    Take 2 mg by mouth every 6 (six) hours as needed for diarrhea or loose stools.    MENTHOL, TOPICAL ANALGESIC, (BIOFREEZE) 4 % GEL    Apply topically. Apply to both knees three times daily, apply to right shoulder three times daily   METHOCARBAMOL (ROBAXIN) 500 MG TABLET    Take 500 mg by mouth 2 (two) times daily.   MULTIPLE VITAMINS-MINERALS (DECUBI-VITE) CAPS    Take 1 capsule by mouth daily.   POLYETHYLENE GLYCOL (MIRALAX / GLYCOLAX) PACKET    Take 17 g by mouth daily.   RANITIDINE (ZANTAC) 150 MG TABLET    Take 150 mg by mouth at bedtime.   SERTRALINE (ZOLOFT) 25 MG TABLET    Take 25 mg by mouth daily. Give with 50 mg tab to equal 75 mg   SERTRALINE (ZOLOFT) 50 MG TABLET    Take 50 mg by mouth daily. Give with 25 mg tab to equal 75 mg total.  Modified Medications   No medications on file  Discontinued Medications   No medications on file     SIGNIFICANT DIAGNOSTIC EXAMS  10-24-14: -d echo: - Left ventricle: The cavity size was normal. There was mild focal basal hypertrophy of the septum. Systolic function was normal. The estimated ejection fraction was in the range of 55% to 60%. Wall motion was normal; there were no regional wall motion abnormalities. Features are consistent with a pseudonormal left ventricular filling pattern, with concomitant abnormal relaxation and increased  filling pressure (grade 2 diastolic dysfunction). - Aortic valve: There was mild stenosis.  - Mitral valve: There was moderate regurgitation. - Left atrium: The atrium  was moderately dilated. - Atrial septum: There was a possible patent foramen ovale. - Tricuspid valve: There was moderate regurgitation. - Pulmonary arteries: Systolic pressure was severely increased. PA peak pressure: 86 mm Hg (S).   12-19-15: chest x-ray: 1. No focal pneumonia. 2. Pulmonary venous congestion      LABS REVIEWED:   12-18-15: wbc 12.9; hgb 13.6; hct 41.8; mcv 93.5; plt 204; glucose 124 bun 21; creat 1.05; k+ 4.6; na++138; alk phos 142; ast 80 ;albumin 3.3 12-20-15:glcuose 94; bun 14; creat 0.83; k+ 3.8; na++144; liver normal albumin 2.9 12-21-15: wbc 13.5; hgb 11.8; hct 26.8; mcv 92.7; plt 172  12-24-15: wbc 9.0; hgb 12.0; hct 35.7; mcv 89.7. plt 213; glucose 124; bun 33; creat 1.27; k+ 4.2; na++138 04-08-16: k+ 4.0  04-27-16: k+ 3.7  05-25-16: wbc 9.2; hgb 11.5; hct 35.3; mcv 92.4; plt 212  07-26-16: glucose 92; bun 21.1; creat 0.95; k+ 4.1; na++ 144; liver normal albumin 3.0      Review of Systems Unable to perform ROS: Dementia     Physical Exam Constitutional: No distress.  Eyes: Conjunctivae are normal.  Neck: Neck supple. No JVD present. No thyromegaly present.  Cardiovascular: Normal rate, regular rhythm and intact distal pulses.   Respiratory: Effort normal and breath sounds normal. No respiratory distress. She has no wheezes.  GI: Soft. Bowel sounds are normal. She exhibits no distension. There is no tenderness.  Musculoskeletal: She exhibits no edema.  Able to move all extremities   Lymphadenopathy:    She has no cervical adenopathy.  Neurological: She is alert.  Skin: Skin is warm and dry. She is not diaphoretic.  Psychiatric: She has a normal mood and affect.      ASSESSMENT/ PLAN:  1. Hypertension: will continue  norvasc 5 mg daily   2. Chronic pain syndrome:  Has spinal cord  compression: will continue  neurontin 600 mg three times daily;   Will continue robaxin 500 mg twice daily  biofreeze to right shoulder and bilateral knees twice daily      3. Anemia:  hgb is 11.5 will continue iron  daily     4.  Constipation: will continue   miralax daily   5. Gerd: has celiac disease will need to have a gluten free diet will continue zantac 150 mg daily   6. Depression: she is stable and does receive benefit from zoloft 75 mg daily and will monitor   7. Peripheral neuropathy: is stable will continue neurontin 600 mg three times daily   8. CVA: is neurologically stable is presently not on medications; will not make changes will monitor    9. Hypokalemia: k+ 4.1; is not on supplement   10. Vascular dementia: no significant change in her status; her weight stable at 161 pounds; is presently not taking medications; will not make changes and will monitor    Will check cbc; cmp    Ok Edwards NP [facility]  Contact [phone] Monday through Friday 8am- 5pm  After hours call [phone]

## 2016-12-06 LAB — CBC AND DIFFERENTIAL
HCT: 36 % (ref 36–46)
Hemoglobin: 11.4 g/dL — AB (ref 12.0–16.0)
NEUTROS ABS: 5 /uL
PLATELETS: 209 10*3/uL (ref 150–399)
WBC: 8.9 10*3/mL

## 2016-12-06 LAB — HEPATIC FUNCTION PANEL
ALT: 15 U/L (ref 7–35)
AST: 41 U/L — AB (ref 13–35)
Alkaline Phosphatase: 97 U/L (ref 25–125)
BILIRUBIN, TOTAL: 0.3 mg/dL

## 2016-12-06 LAB — BASIC METABOLIC PANEL
BUN: 27 mg/dL — AB (ref 4–21)
Creatinine: 0.9 mg/dL (ref 0.5–1.1)
GLUCOSE: 148 mg/dL
Potassium: 4 mmol/L (ref 3.4–5.3)
SODIUM: 142 mmol/L (ref 137–147)

## 2017-01-30 LAB — CBC AND DIFFERENTIAL
HCT: 34 % — AB (ref 36–46)
HEMOGLOBIN: 11.2 g/dL — AB (ref 12.0–16.0)
Platelets: 243 10*3/uL (ref 150–399)
WBC: 9.8 10^3/mL

## 2017-02-07 ENCOUNTER — Non-Acute Institutional Stay (SKILLED_NURSING_FACILITY): Payer: Medicare Other

## 2017-02-07 DIAGNOSIS — Z0000 Encounter for general adult medical examination without abnormal findings: Secondary | ICD-10-CM | POA: Diagnosis not present

## 2017-02-07 NOTE — Progress Notes (Signed)
Quick Notes   Health Maintenance:  none     Abnormal Screen: None     Patient Concerns: Pt wants a walker to use in addition to her wheelchair     Nurse Concerns: None

## 2017-02-07 NOTE — Progress Notes (Signed)
Subjective:   [name] is a 83 y.o. female who presents for an Initial Medicare Annual Wellness Visit at [facility]  Cardiac Risk Factors include: advanced age (>11men, >17 women);hypertension;sedentary lifestyle     Objective:    Today's Vitals   02/07/17 1311 02/07/17 1312  BP: (!) 150/80   Pulse: 82   Temp: 98.4 F (36.9 C)   TempSrc: Oral   Weight: 143 lb 6.4 oz (65 kg)   Height: 5\' 2"  (1.575 m)   PainSc:  8    Body mass index is 26.23 kg/m.   Current Medications (verified) Outpatient Encounter Prescriptions as of 02/07/2017  Medication Sig  . amLODipine (NORVASC) 5 MG tablet Take 1 tablet (5 mg total) by mouth daily.  Marland Kitchen CALCIUM PO Take 600 mg by mouth 2 (two) times daily.   . carboxymethylcellulose (REFRESH PLUS) 0.5 % SOLN Place 1 drop into both eyes 2 (two) times daily.   . ferrous sulfate 325 (65 FE) MG tablet Take 325 mg by mouth daily with breakfast.  . gabapentin (NEURONTIN) 600 MG tablet Take 600 mg by mouth 3 (three) times daily.  Marland Kitchen loperamide (IMODIUM) 2 MG capsule Take 2 mg by mouth every 6 (six) hours as needed for diarrhea or loose stools.   . Menthol, Topical Analgesic, (BIOFREEZE) 4 % GEL Apply topically. Apply to both knees three times daily, apply to right shoulder three times daily  . methocarbamol (ROBAXIN) 500 MG tablet Take 500 mg by mouth 2 (two) times daily.  . Multiple Vitamins-Minerals (DECUBI-VITE) CAPS Take 1 capsule by mouth daily.  . polyethylene glycol (MIRALAX / GLYCOLAX) packet Take 17 g by mouth daily.  . ranitidine (ZANTAC) 150 MG tablet Take 150 mg by mouth at bedtime.  . sertraline (ZOLOFT) 25 MG tablet Take 25 mg by mouth daily. Give with 50 mg tab to equal 75 mg  . sertraline (ZOLOFT) 50 MG tablet Take 50 mg by mouth daily. Give with 25 mg tab to equal 75 mg total.   No facility-administered encounter medications on file as of 02/07/2017.     Allergies (verified) Codeine and Ibuprofen   History: Past  Medical History:  Diagnosis Date  . Anemia   . Arthritis    b/l shoulder R>L  . Atrial tachycardia ([place])    echo 04/08/11: EF 55-60%, mild LVH, grade 1 diast dysfxn;    s/p RFCA with Dr. Lovena Le  04/09/11  . Cataracts, bilateral   . Chronic pain syndrome 04/09/2013   On chronic opioids. Cervical MRI 11/ 2012 : Diffuse cervical spondylosis, Diffuse degenerative disc disease with multilevel foraminal stenosis. Possible T1 nerve root involvement. Lumbar MRI 2009 : prior fusion at L3-4 and degenerative disc disease Rotator cuff repair : Dr. Berenice Primas 7/07. Follows with Dr. Tamera Punt for possible shoulder surgery.  H/O lumbar spinal stenosis with steroid injections. Mo  . CVA (cerebral infarction) 04/09/2013   MRI 2005 : Multiple tiny old lacunar infarcts as well as scattered white matter small vessel ischemic disease. Pt asymptomatic and denies TIA / CVA hx.    . Depression   . Essential hypertension 05/15/2007   Controlled with single drug tx    . Fecal occult blood test positive   . GERD (gastroesophageal reflux disease)   . Head ache   . Hemorrhoids   . History of blood transfusion 2014   anemia-   . Hyperkalemia 04/08/2011  . Hypertension   . Lumbar spinal stenosis    gets Spinal  injections by Dr. Marlaine Hind   . Osteoporosis, unspecified 04/09/2013   DEXA 5/07 : T L forearm -2.9. L femur -0.6, R femur -1.4. [name]  . Postnasal drip   . Renal insufficiency, mild   . Restless leg syndrome   . Rotator cuff tear arthropathy    03/22/13 dx [facility] (Dr. Tamera Punt) considering surgery  . Rupture of rotator cuff of shoulder    s/p repair by Dr. Berenice Primas 7/07. Follows with Dr. Tamera Punt for possible shoulder surgery.   . Stroke ([place])   . SVT (supraventricular tachycardia) ([place])   . Unspecified deficiency anemia    Past Surgical History:  Procedure Laterality Date  . ABDOMINAL HYSTERECTOMY    . BACK SURGERY     lumbar x 2, 2000 and 2004 by Dr. Deri Fuelling.   Marland Kitchen DOPPLER  ECHOCARDIOGRAPHY  2004  . EYE SURGERY Bilateral    cataract  . LUMBAR LAMINECTOMY/DECOMPRESSION MICRODISCECTOMY Left 08/06/2013   Procedure: Left Thoracic eleven-twelve microdiskectomy ;  Surgeon: Charlie Pitter, MD;  Location: [facility];  Service: Neurosurgery;  Laterality: Left;  Left Thoracic eleven-twelve microdiskectomy   . ORIF FINGER FRACTURE Left    ring finger  . ROTATOR CUFF REPAIR     right   Family History  Problem Relation Age of Onset  . Coronary artery disease Neg Hx     premature   Social History   Occupational History  .  Retired   Social History Main Topics  . Smoking status: Never Smoker  . Smokeless tobacco: Never Used  . Alcohol use No  . Drug use: Yes    Types: Methamphetamines  . Sexual activity: Not Currently    Tobacco Counseling Counseling given: Not Answered   Activities of Daily Living In your present state of health, do you have any difficulty performing the following activities: 02/07/2017  Hearing? N  Vision? N  Difficulty concentrating or making decisions? N  Walking or climbing stairs? Y  Dressing or bathing? Y  Doing errands, shopping? Y  Preparing Food and eating ? Y  Using the Toilet? Y  In the past six months, have you accidently leaked urine? Y  Do you have problems with loss of bowel control? Y  Managing your Medications? Y  Managing your Finances? Y  Housekeeping or managing your Housekeeping? Y  Some recent data might be hidden    Immunizations and Health Maintenance Immunization History  Administered Date(s) Administered  . Influenza Split 07/11/2011, 10/20/2011, 07/30/2012  . Influenza Whole 08/16/2007, 06/09/2010  . Influenza,inj,Quad PF,36+ Mos 06/27/2013  . Influenza-Unspecified 07/23/2014, 07/03/2015, 08/05/2016  . PPD Test 08/12/2013  . Pneumococcal Polysaccharide-23 07/11/2011  . Pneumococcal-Unspecified 07/28/2014  . Td 06/18/2009  . Tdap 06/28/2014   There are no preventive care reminders to display for  this patient.  Patient Care Team: [name], DO as PCP - General (Internal Medicine) Juanita Craver, MD as Attending Physician (Gastroenterology) [facility]  Indicate any recent Medical Services you may have received from other than [facility] providers in the past year (date may be approximate).     Assessment:   This is a routine wellness examination for [facility].   Hearing/Vision screen No exam data present  Dietary issues and exercise activities discussed: Current Exercise Habits: The patient does not participate in regular exercise at present, Exercise limited by: orthopedic condition(s)  Goals    . Blood Pressure < 140/90    . Maintain Lifestyle  Starting 02/07/2017 pt will maintain her lifestyle.       Depression Screen PHQ 2/9 Scores 02/07/2017 08/01/2013 07/05/2013 06/27/2013 03/25/2013 02/18/2013 02/07/2013  PHQ - 2 Score 1 6 0 0 0 0 0  PHQ- 9 Score - 18 - - - - -    Fall Risk Fall Risk  02/07/2017 08/01/2013 07/05/2013 06/27/2013 04/22/2013  Falls in the past year? No Yes Yes Yes No  Number falls in past yr: - 2 or more 2 or more 2 or more -  Injury with Fall? - - - - -  Risk Factor Category  - High Fall Risk High Fall Risk High Fall Risk -  Risk for fall due to : - History of fall(s);Impaired balance/gait;Impaired mobility Impaired balance/gait History of fall(s);Impaired balance/gait;Impaired mobility;Impaired vision;Medication side effect Impaired balance/gait;Impaired mobility;History of fall(s)  Risk for fall due to (comments): - - - - -    Cognitive Function: Within normal limits        Screening Tests Health Maintenance  Topic Date Due  . PNA vac Low Risk Adult (2 of 2 - PCV13) 10/10/2026 (Originally 07/29/2015)  . INFLUENZA VACCINE  05/10/2017  . TETANUS/TDAP  06/28/2024  . DEXA SCAN  Completed      Plan:    I have personally reviewed and addressed the Medicare Annual Wellness questionnaire and have noted the  following in the patient's chart:  A. Medical and social history B. Use of alcohol, tobacco or illicit drugs  C. Current medications and supplements D. Functional ability and status E.  Nutritional status F.  Physical activity G. Advance directives H. List of other physicians I.  Hospitalizations, surgeries, and ER visits in previous 12 months J.  [place] to include hearing, vision, cognitive, depression L. Referrals and appointments - none  In addition, I have reviewed and discussed with patient certain preventive protocols, quality metrics, and best practice recommendations. A written personalized care plan for preventive services as well as general preventive health recommendations were provided to patient.  See attached scanned questionnaire for additional information.   Signed,   Rich Reining, RN Nurse Health Advisor

## 2017-02-07 NOTE — Patient Instructions (Signed)
[name] , Thank you for taking time to come for your Medicare Wellness Visit. I appreciate your ongoing commitment to your health goals. Please review the following plan we discussed and let me know if I can assist you in the future.   Screening recommendations/referrals: Colonoscopy up to date Mammogram up to date Bone Density up to date Recommended yearly ophthalmology/optometry visit for glaucoma screening and checkup Recommended yearly dental visit for hygiene and checkup  Vaccinations: Influenza vaccine up to date Pneumococcal vaccine up to date Tdap vaccine up to date. Due 06/28/2024 Shingles vaccine not in record. If you want the new vaccine, Shingrix, let us know and we will order it.  Advanced directives: In facility chart  Conditions/risks identified: None  Next appointment: none upcoming.   Preventive Care 41 Years and Older, Female Preventive care refers to lifestyle choices and visits with your health care provider that can promote health and wellness. What does preventive care include?  A yearly physical exam. This is also called an annual well check.  Dental exams once or twice a year.  Routine eye exams. Ask your health care provider how often you should have your eyes checked.  Personal lifestyle choices, including:  Daily care of your teeth and gums.  Regular physical activity.  Eating a healthy diet.  Avoiding tobacco and drug use.  Limiting alcohol use.  Practicing safe sex.  Taking low-dose aspirin every day.  Taking vitamin and mineral supplements as recommended by your health care provider. What happens during an annual well check? The services and screenings done by your health care provider during your annual well check will depend on your age, overall health, lifestyle risk factors, and family history of disease. Counseling  Your health care provider may ask you questions about your:  Alcohol use.  Tobacco use.  Drug  use.  Emotional well-being.  Home and relationship well-being.  Sexual activity.  Eating habits.  History of falls.  Memory and ability to understand (cognition).  Work and work Statistician.  Reproductive health. Screening  You may have the following tests or measurements:  Height, weight, and BMI.  Blood pressure.  Lipid and cholesterol levels. These may be checked every 5 years, or more frequently if you are over 44 years old.  Skin check.  Lung cancer screening. You may have this screening every year starting at age 20 if you have a 30-pack-year history of smoking and currently smoke or have quit within the past 15 years.  Fecal occult blood test (FOBT) of the stool. You may have this test every year starting at age 64.  Flexible sigmoidoscopy or colonoscopy. You may have a sigmoidoscopy every 5 years or a colonoscopy every 10 years starting at age 55.  Hepatitis C blood test.  Hepatitis B blood test.  Sexually transmitted disease (STD) testing.  Diabetes screening. This is done by checking your blood sugar (glucose) after you have not eaten for a while (fasting). You may have this done every 1-3 years.  Bone density scan. This is done to screen for osteoporosis. You may have this done starting at age 85.  Mammogram. This may be done every 1-2 years. Talk to your health care provider about how often you should have regular mammograms. Talk with your health care provider about your test results, treatment options, and if necessary, the need for more tests. Vaccines  Your health care provider may recommend certain vaccines, such as:  Influenza vaccine. This is recommended every year.  Tetanus, diphtheria,  and acellular pertussis (Tdap, Td) vaccine. You may need a Td booster every 10 years.  Zoster vaccine. You may need this after age 65.  Pneumococcal 13-valent conjugate (PCV13) vaccine. One dose is recommended after age 61.  Pneumococcal polysaccharide  (PPSV23) vaccine. One dose is recommended after age 80. Talk to your health care provider about which screenings and vaccines you need and how often you need them. This information is not intended to replace advice given to you by your health care provider. Make sure you discuss any questions you have with your health care provider. Document Released: 10/23/2015 Document Revised: 06/15/2016 Document Reviewed: 07/28/2015 Elsevier Interactive Patient Education  2017 [place] Prevention in the Home Falls can cause injuries. They can happen to people of all ages. There are many things you can do to make your home safe and to help prevent falls. What can I do on the outside of my home?  Regularly fix the edges of walkways and driveways and fix any cracks.  Remove anything that might make you trip as you walk through a door, such as a raised step or threshold.  Trim any bushes or trees on the path to your home.  Use bright outdoor lighting.  Clear any walking paths of anything that might make someone trip, such as rocks or tools.  Regularly check to see if handrails are loose or broken. Make sure that both sides of any steps have handrails.  Any raised decks and porches should have guardrails on the edges.  Have any leaves, snow, or ice cleared regularly.  Use sand or salt on walking paths during winter.  Clean up any spills in your garage right away. This includes oil or grease spills. What can I do in the bathroom?  Use night lights.  Install grab bars by the toilet and in the tub and shower. Do not use towel bars as grab bars.  Use non-skid mats or decals in the tub or shower.  If you need to sit down in the shower, use a plastic, non-slip stool.  Keep the floor dry. Clean up any water that spills on the floor as soon as it happens.  Remove soap buildup in the tub or shower regularly.  Attach bath mats securely with double-sided non-slip rug tape.  Do not have  throw rugs and other things on the floor that can make you trip. What can I do in the bedroom?  Use night lights.  Make sure that you have a light by your bed that is easy to reach.  Do not use any sheets or blankets that are too big for your bed. They should not hang down onto the floor.  Have a firm chair that has side arms. You can use this for support while you get dressed.  Do not have throw rugs and other things on the floor that can make you trip. What can I do in the kitchen?  Clean up any spills right away.  Avoid walking on wet floors.  Keep items that you use a lot in easy-to-reach places.  If you need to reach something above you, use a strong step stool that has a grab bar.  Keep electrical cords out of the way.  Do not use floor polish or wax that makes floors slippery. If you must use wax, use non-skid floor wax.  Do not have throw rugs and other things on the floor that can make you trip. What can I do with my  stairs?  Do not leave any items on the stairs.  Make sure that there are handrails on both sides of the stairs and use them. Fix handrails that are broken or loose. Make sure that handrails are as long as the stairways.  Check any carpeting to make sure that it is firmly attached to the stairs. Fix any carpet that is loose or worn.  Avoid having throw rugs at the top or bottom of the stairs. If you do have throw rugs, attach them to the floor with carpet tape.  Make sure that you have a light switch at the top of the stairs and the bottom of the stairs. If you do not have them, ask someone to add them for you. What else can I do to help prevent falls?  Wear shoes that:  Do not have high heels.  Have rubber bottoms.  Are comfortable and fit you well.  Are closed at the toe. Do not wear sandals.  If you use a stepladder:  Make sure that it is fully opened. Do not climb a closed stepladder.  Make sure that both sides of the stepladder are  locked into place.  Ask someone to hold it for you, if possible.  Clearly mark and make sure that you can see:  Any grab bars or handrails.  First and last steps.  Where the edge of each step is.  Use tools that help you move around (mobility aids) if they are needed. These include:  Canes.  Walkers.  Scooters.  Crutches.  Turn on the lights when you go into a dark area. Replace any light bulbs as soon as they burn out.  Set up your furniture so you have a clear path. Avoid moving your furniture around.  If any of your floors are uneven, fix them.  If there are any pets around you, be aware of where they are.  Review your medicines with your doctor. Some medicines can make you feel dizzy. This can increase your chance of falling. Ask your doctor what other things that you can do to help prevent falls. This information is not intended to replace advice given to you by your health care provider. Make sure you discuss any questions you have with your health care provider. Document Released: 07/23/2009 Document Revised: 03/03/2016 Document Reviewed: 10/31/2014 Elsevier Interactive Patient Education  2017 [employer].

## 2017-02-21 ENCOUNTER — Non-Acute Institutional Stay (SKILLED_NURSING_FACILITY): Payer: Medicare Other | Admitting: Internal Medicine

## 2017-02-21 ENCOUNTER — Encounter: Payer: Self-pay | Admitting: Internal Medicine

## 2017-02-21 DIAGNOSIS — M25562 Pain in left knee: Secondary | ICD-10-CM | POA: Diagnosis not present

## 2017-02-21 DIAGNOSIS — F0150 Vascular dementia without behavioral disturbance: Secondary | ICD-10-CM | POA: Diagnosis not present

## 2017-02-21 DIAGNOSIS — Z8673 Personal history of transient ischemic attack (TIA), and cerebral infarction without residual deficits: Secondary | ICD-10-CM | POA: Diagnosis not present

## 2017-02-21 DIAGNOSIS — G9520 Unspecified cord compression: Secondary | ICD-10-CM | POA: Diagnosis not present

## 2017-02-21 DIAGNOSIS — G894 Chronic pain syndrome: Secondary | ICD-10-CM | POA: Diagnosis not present

## 2017-02-21 DIAGNOSIS — I10 Essential (primary) hypertension: Secondary | ICD-10-CM

## 2017-02-21 DIAGNOSIS — G6289 Other specified polyneuropathies: Secondary | ICD-10-CM | POA: Diagnosis not present

## 2017-02-21 DIAGNOSIS — F329 Major depressive disorder, single episode, unspecified: Secondary | ICD-10-CM

## 2017-02-21 DIAGNOSIS — D508 Other iron deficiency anemias: Secondary | ICD-10-CM | POA: Diagnosis not present

## 2017-02-21 DIAGNOSIS — G8929 Other chronic pain: Secondary | ICD-10-CM

## 2017-02-21 NOTE — Progress Notes (Signed)
Patient ID: [name], female   DOB: [birth year], 83 y.o.   MRN: [id number]    DATE: 02/21/2017  Location:    [facility] Room Number: 100 A Place of Service: SNF (31)   Extended Emergency Contact Information Primary Emergency Contact: Lobdell,Thomas Address: [address]          [place], [place] [address] [place] Phone: [phone] Mobile Phone: [phone] Relation: Son Secondary Emergency Contact: Davis,Saranetti  [place] Phone: [phone] Relation: Other  Advanced Directive information Does Patient Have a Medical Advance Directive?: Yes, Type of Advance Directive: Out of facility DNR (pink MOST or yellow form), Pre-existing out of facility DNR order (yellow form or pink MOST form): Yellow form placed in chart (order not valid for inpatient use), Does patient want to make changes to medical advance directive?: No - Patient declined  Chief Complaint  Patient presents with  . Medical Management of Chronic Issues    Routine Visit -[facility]    HPI:  83 yo female long term resident seen today for f/u. [name] left LE pain that radiates from "toes to my knee". She gets biofreeze applied TID for OA. No nursing issues. Appetite reduced. Sleeps well. No falls. She is a poor historian due to dementia. Hx obtained form chart  Hypertension - stable on norvasc 5 mg daily   Chronic pain syndrome/hx spinal cord compression/peripheral neuropathy - uncontrolled on  neurontin 600 mg three times daily; robaxin 500 mg twice daily;  biofreeze roll on gel to right shoulder and bilateral knees TID daily      Hx Anemia - stable on iron  Daily. Last Hgb 11.7     Constipation - stable on miralax daily   GERD/hx celiac disease - gets gluten free diet. Takes  zantac 150 mg daily   Depression - mood stable on zoloft 75 mg daily   Hx CVA - currently not on any meds. No new sx's  Vascular dementia - stable without cognition meds. Weight is  158 lbs.   Past Medical History:  Diagnosis Date  . Anemia   . Arthritis    b/l shoulder R>L  . Atrial tachycardia ([facility])    echo 04/08/11: EF 55-60%, mild LVH, grade 1 diast dysfxn;    s/p RFCA with Dr. Lovena Le  04/09/11  . Cataracts, bilateral   . Chronic pain syndrome 04/09/2013   On chronic opioids. Cervical MRI 11/ 2012 : Diffuse cervical spondylosis, Diffuse degenerative disc disease with multilevel foraminal stenosis. Possible T1 nerve root involvement. Lumbar MRI 2009 : prior fusion at L3-4 and degenerative disc disease Rotator cuff repair : Dr. Berenice Primas 7/07. Follows with Dr. Tamera Punt for possible shoulder surgery.  H/O lumbar spinal stenosis with steroid injections. Mo  . CVA (cerebral infarction) 04/09/2013   MRI 2005 : Multiple tiny old lacunar infarcts as well as scattered white matter small vessel ischemic disease. Pt asymptomatic and denies TIA / CVA hx.    . Depression   . Essential hypertension 05/15/2007   Controlled with single drug tx    . Fecal occult blood test positive   . GERD (gastroesophageal reflux disease)   . Head ache   . Hemorrhoids   . History of blood transfusion 2014   anemia-   . Hyperkalemia 04/08/2011  . Hypertension   . Lumbar spinal stenosis    gets Spinal injections by Dr. Marlaine Hind   . Osteoporosis, unspecified 04/09/2013   DEXA 5/07 : T L  forearm -2.9. L femur -0.6, R femur -1.4. [name]  . Postnasal drip   . Renal insufficiency, mild   . Restless leg syndrome   . Rotator cuff tear arthropathy    03/22/13 dx [facility] (Dr. Tamera Punt) considering surgery  . Rupture of rotator cuff of shoulder    s/p repair by Dr. Berenice Primas 7/07. Follows with Dr. Tamera Punt for possible shoulder surgery.   . Stroke ([place])   . SVT (supraventricular tachycardia) ([place])   . Unspecified deficiency anemia     Past Surgical History:  Procedure Laterality Date  . ABDOMINAL HYSTERECTOMY    . BACK SURGERY     lumbar x 2, 2000 and 2004 by Dr. Deri Fuelling.   Marland Kitchen DOPPLER ECHOCARDIOGRAPHY  2004  . EYE SURGERY Bilateral    cataract  . LUMBAR LAMINECTOMY/DECOMPRESSION MICRODISCECTOMY Left 08/06/2013   Procedure: Left Thoracic eleven-twelve microdiskectomy ;  Surgeon: Charlie Pitter, MD;  Location: [facility];  Service: Neurosurgery;  Laterality: Left;  Left Thoracic eleven-twelve microdiskectomy   . ORIF FINGER FRACTURE Left    ring finger  . ROTATOR CUFF REPAIR     right    Patient Care Team: [name], DO as PCP - General (Internal Medicine) Juanita Craver, MD as Attending Physician (Gastroenterology) [facility]  Social History   Social History  . Marital status: Single    Spouse name: N/A  . Number of children: 1  . Years of education: N/A   Occupational History  .  Retired   Social History Main Topics  . Smoking status: Never Smoker  . Smokeless tobacco: Never Used  . Alcohol use No  . Drug use: Yes    Types: Methamphetamines  . Sexual activity: Not Currently   Other Topics Concern  . Not on file   Social History Narrative   Originally from [place]. She has been here in the [place] since the 60's.   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        reports that she has never smoked. She has never used smokeless tobacco. She reports that she uses drugs, including Methamphetamines. She reports that she does not drink alcohol.  Family History  Problem Relation Age of Onset  . Coronary artery disease Neg Hx        premature   No family status information on file.    Immunization History  Administered Date(s) Administered  . Influenza Split 07/11/2011, 10/20/2011, 07/30/2012  . Influenza Whole 08/16/2007, 06/09/2010  . Influenza,inj,Quad PF,36+ Mos 06/27/2013  . Influenza-Unspecified 07/23/2014, 07/03/2015, 08/05/2016  . PPD Test 08/12/2013  . Pneumococcal  Polysaccharide-23 07/11/2011  . Pneumococcal-Unspecified 07/28/2014  . Td 06/18/2009  . Tdap 06/28/2014    Allergies  Allergen Reactions  . Codeine     Hallucinations  . Ibuprofen     Confusion    Medications: Patient's Medications  New Prescriptions   No medications on file  Previous Medications   AMINO ACIDS-PROTEIN HYDROLYS (FEEDING SUPPLEMENT, PRO-STAT SUGAR FREE 64,) LIQD    Take 30 mLs by mouth 2 (two) times daily.   AMLODIPINE (NORVASC) 5 MG TABLET    Take 1 tablet (5 mg total) by mouth daily.   CALCIUM PO    Take 600 mg by mouth 2 (two) times daily.  CARBOXYMETHYLCELLULOSE (REFRESH PLUS) 0.5 % SOLN    Place 1 drop into both eyes 2 (two) times daily.    CHOLECALCIFEROL (VITAMIN D) 1000 UNITS TABLET    Take 1,000 Units by mouth daily.   FERROUS SULFATE 325 (65 FE) MG TABLET    Take 325 mg by mouth daily with breakfast.   GABAPENTIN (NEURONTIN) 600 MG TABLET    Take 600 mg by mouth 3 (three) times daily.   LOPERAMIDE (IMODIUM) 2 MG CAPSULE    Take 2 mg by mouth every 6 (six) hours as needed for diarrhea or loose stools.    MENTHOL, TOPICAL ANALGESIC, (BIOFREEZE) 4 % GEL    Apply topically. Apply to both knees three times daily, apply to right shoulder three times daily   METHOCARBAMOL (ROBAXIN) 500 MG TABLET    Take 500 mg by mouth 2 (two) times daily.   MULTIPLE VITAMINS-MINERALS (DECUBI-VITE) CAPS    Take 1 capsule by mouth daily.   NUTRITIONAL SUPPLEMENTS (NUTRITIONAL DRINK PO)    Take 120 mLs by mouth 3 (three) times daily. Med [facility]   POLYETHYLENE GLYCOL (MIRALAX / GLYCOLAX) PACKET    Take 17 g by mouth daily.   RANITIDINE (ZANTAC) 150 MG TABLET    Take 150 mg by mouth at bedtime.   SERTRALINE (ZOLOFT) 25 MG TABLET    Take 25 mg by mouth daily. Give with 50 mg tab to equal 75 mg   SERTRALINE (ZOLOFT) 50 MG TABLET    Take 50 mg by mouth daily. Give with 25 mg tab to equal 75 mg total.  Modified Medications   No medications on file  Discontinued Medications   No  medications on file    Review of Systems  Unable to perform ROS: Dementia    Vitals:   02/21/17 1045  BP: 127/72  Pulse: 80  Resp: 20  Temp: 97.9 F (36.6 C)  TempSrc: Oral  SpO2: 97%  Weight: 158 lb (71.7 kg)  Height: 5\' 2"  (1.575 m)   Body mass index is 28.9 kg/m.  Physical Exam  Constitutional: She appears well-developed.  Frail appearing, lying in bed in NAD  HENT:  Mouth/Throat: Oropharynx is clear and moist. No oropharyngeal exudate.  Eyes: Pupils are equal, round, and reactive to light. No scleral icterus.  Neck: Neck supple. Carotid bruit is not present. No tracheal deviation present. No thyromegaly present.  Cardiovascular: Normal rate, regular rhythm and intact distal pulses.  Exam reveals no gallop and no friction rub.   Murmur (1/6 SEM) heard. No LE edema b/l. no calf TTP.   Pulmonary/Chest: Effort normal. No stridor. No respiratory distress. She has decreased breath sounds (right base). She has no wheezes. She has no rales.  Abdominal: Soft. Bowel sounds are normal. She exhibits distension. She exhibits no mass. There is no hepatomegaly. There is no tenderness. There is no rebound and no guarding.  Musculoskeletal: She exhibits edema (L>R knee swelling with reduced ROM; DIP/PIP joint swelling) and tenderness (left knee medial>lateral).  Lymphadenopathy:    She has no cervical adenopathy.  Neurological: She is alert.  Skin: Skin is warm and dry. No rash noted.  Psychiatric: She has a normal mood and affect. Her behavior is normal.     Labs reviewed: Nursing Home on 02/21/2017  Component Date Value Ref Range Status  . Hemoglobin 01/30/2017 11.2* 12.0 - 16.0 g/dL Final  . HCT 01/30/2017 34* 36 - 46 % Final  . Platelets 01/30/2017 243  150 - 399 K/L Final  .  WBC 01/30/2017 9.8  10^3/mL Final  Abstract on 12/26/2016  Component Date Value Ref Range Status  . Hemoglobin 12/06/2016 11.4* 12.0 - 16.0 g/dL Final  . HCT 12/06/2016 36  36 - 46 % Final  .  Neutrophils Absolute 12/06/2016 5  /L Final  . Platelets 12/06/2016 209  150 - 399 K/L Final  . WBC 12/06/2016 8.9  10^3/mL Final  . Glucose 12/06/2016 148  mg/dL Final  . BUN 12/06/2016 27* 4 - 21 mg/dL Final  . Creatinine 12/06/2016 0.9  0.5 - 1.1 mg/dL Final  . Potassium 12/06/2016 4.0  3.4 - 5.3 mmol/L Final  . Sodium 12/06/2016 142  137 - 147 mmol/L Final  . Alkaline Phosphatase 12/06/2016 97  25 - 125 U/L Final  . ALT 12/06/2016 15  7 - 35 U/L Final  . AST 12/06/2016 41* 13 - 35 U/L Final  . Bilirubin, Total 12/06/2016 0.3  mg/dL Final    No results found.   Assessment/Plan   ICD-9-CM ICD-10-CM   1. Chronic pain of left knee - failing to change as expected 719.46 M25.562    338.29 G89.29   2. Other polyneuropathy 357.89 G62.89   3. Vascular dementia without behavioral disturbance 290.40 F01.50   4. Chronic pain syndrome 338.4 G89.4   5. History of stroke V12.54 Z86.73   6. Essential hypertension 401.9 I10   7. Spinal cord compression (HCC) 336.9 G95.20   8. Iron deficiency anemia secondary to inadequate dietary iron intake 280.1 D50.8   9. Major depressive disorder with single episode, remission status unspecified 296.20 F32.9      Increase biofreeze to QID to b/l knee; cont TID to right shoulder  Cont other meds as ordered  PT/OT/ST as indicated  Nutritional supplements as indicated  Discussed case with [facility] NP  Will follow   Braelin Costlow S. Perlie Gold  [facility] [address] [place], [place] [address] [phone] Cell (Monday-Friday 8 AM - 5 PM) [phone] After 5 PM and follow prompts

## 2017-03-03 ENCOUNTER — Other Ambulatory Visit: Payer: Self-pay

## 2017-03-03 MED ORDER — TRAMADOL HCL 50 MG PO TABS: ORAL_TABLET | ORAL | 0 refills | Status: DC

## 2017-03-03 NOTE — Telephone Encounter (Signed)
Refill request from [facility]

## 2017-03-27 ENCOUNTER — Other Ambulatory Visit: Payer: Self-pay | Admitting: *Deleted

## 2017-03-27 MED ORDER — TRAMADOL HCL 50 MG PO TABS: ORAL_TABLET | ORAL | 0 refills | Status: AC

## 2017-03-27 NOTE — Telephone Encounter (Signed)
[facility] #: [phone] Fax#: [phone]  

## 2017-04-02 IMAGING — DX DG CHEST 1V PORT
1 series · 1 of 1 positions shown · non-contrast
Comparison: One-view chest 12/18/2015.

CLINICAL DATA: Shortness of breath.  Hypertension.

EXAM:
PORTABLE CHEST - 1 VIEW

[chest ap]
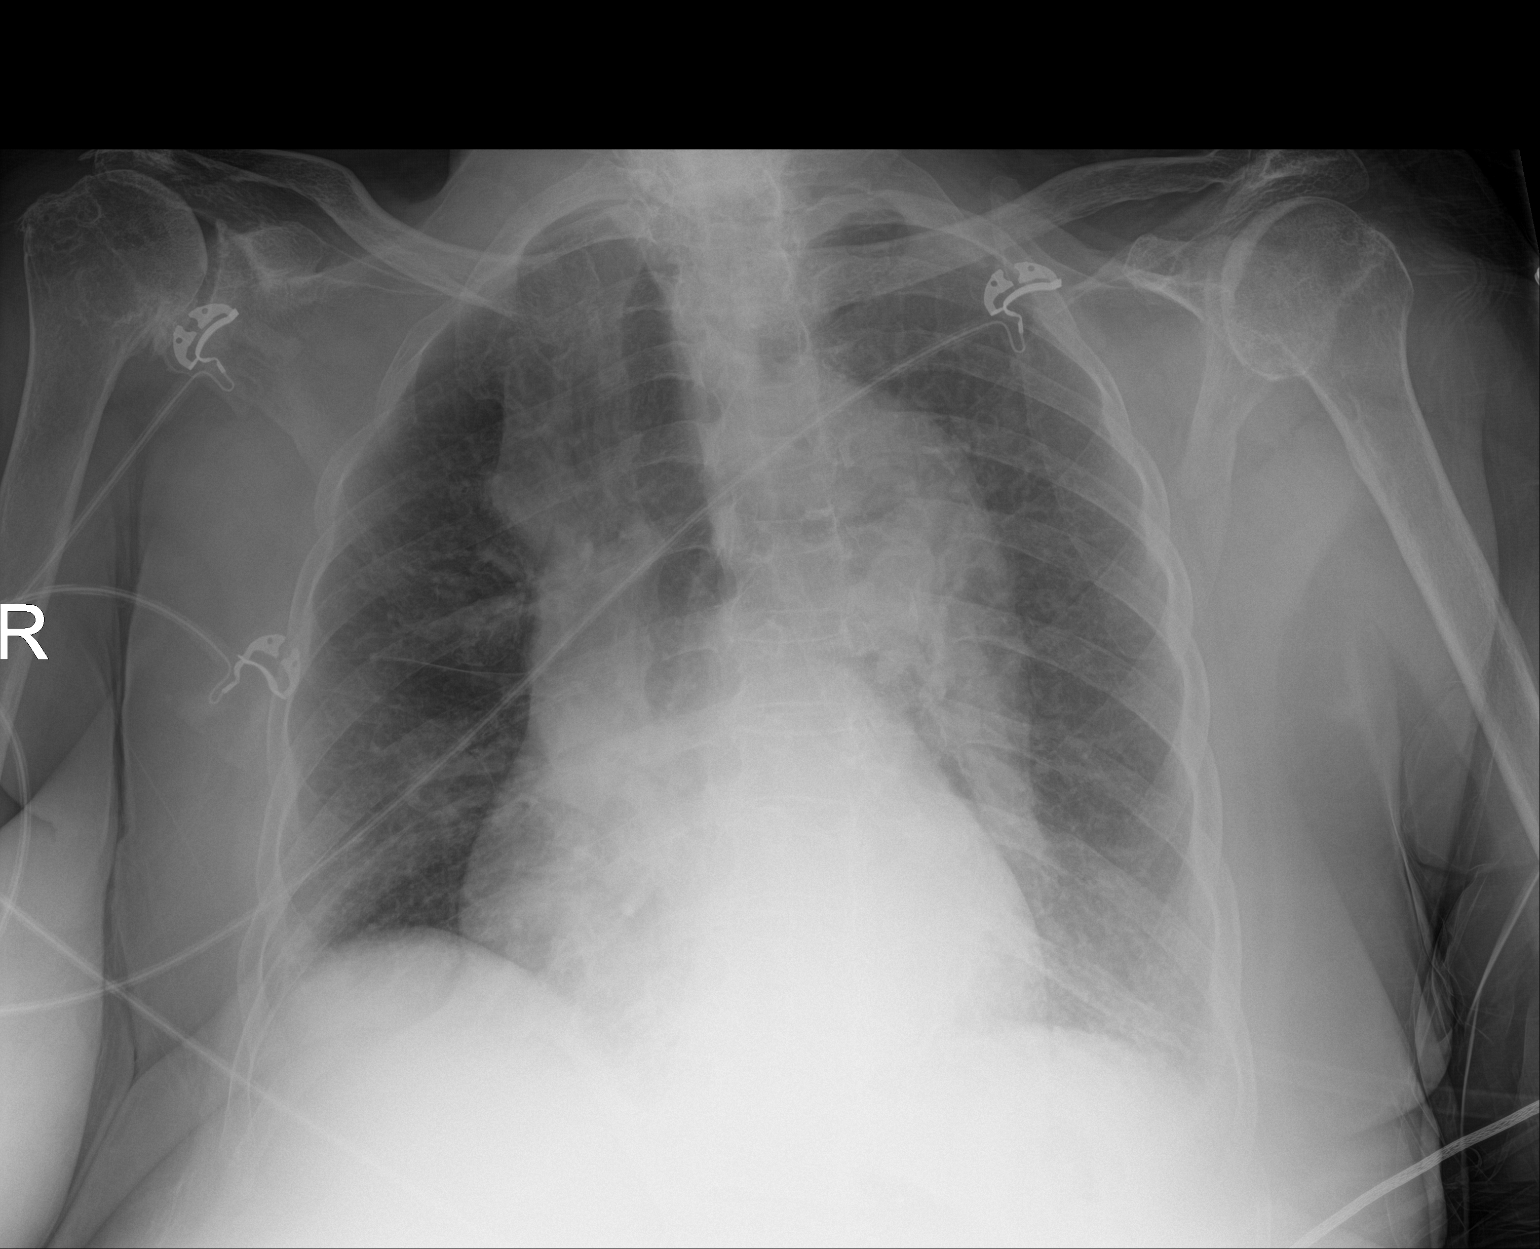

[1 of 1 positions shown; findings below may reference images not displayed]

FINDINGS: The heart is mildly enlarged. Aeration is slightly improved. No
focal airspace consolidation is evident. Mild interstitial
prominence per cysts. Mark degenerative changes are noted at both
shoulders.
IMPRESSION: 1. Slightly improved aeration with persistent interstitial
prominence. This is likely in part chronic.
2. No focal airspace consolidation.

## 2017-05-30 ENCOUNTER — Other Ambulatory Visit: Payer: Self-pay | Admitting: Family Medicine

## 2017-05-30 ENCOUNTER — Other Ambulatory Visit (HOSPITAL_COMMUNITY): Payer: Self-pay | Admitting: Family Medicine

## 2017-05-31 ENCOUNTER — Other Ambulatory Visit (HOSPITAL_COMMUNITY): Payer: Self-pay | Admitting: Family Medicine

## 2017-05-31 DIAGNOSIS — M7989 Other specified soft tissue disorders: Secondary | ICD-10-CM

## 2017-05-31 DIAGNOSIS — R410 Disorientation, unspecified: Secondary | ICD-10-CM

## 2017-06-07 ENCOUNTER — Other Ambulatory Visit (HOSPITAL_COMMUNITY): Payer: Self-pay | Admitting: Family Medicine

## 2017-06-07 ENCOUNTER — Ambulatory Visit (HOSPITAL_COMMUNITY)
Admission: RE | Admit: 2017-06-07 | Discharge: 2017-06-07 | Disposition: A | Discharge status: Home / Self Care | Payer: Medicare Other | Source: Ambulatory Visit | Attending: Family Medicine | Admitting: Family Medicine

## 2017-06-07 DIAGNOSIS — M7989 Other specified soft tissue disorders: Secondary | ICD-10-CM

## 2017-06-07 DIAGNOSIS — R0602 Shortness of breath: Secondary | ICD-10-CM

## 2017-06-07 DIAGNOSIS — Z961 Presence of intraocular lens: Secondary | ICD-10-CM | POA: Diagnosis not present

## 2017-06-07 DIAGNOSIS — I2510 Atherosclerotic heart disease of native coronary artery without angina pectoris: Secondary | ICD-10-CM | POA: Insufficient documentation

## 2017-06-07 DIAGNOSIS — R410 Disorientation, unspecified: Secondary | ICD-10-CM

## 2017-06-07 DIAGNOSIS — R911 Solitary pulmonary nodule: Secondary | ICD-10-CM | POA: Insufficient documentation

## 2017-06-07 DIAGNOSIS — I700 Atherosclerosis of aorta: Secondary | ICD-10-CM | POA: Diagnosis not present

## 2017-08-14 ENCOUNTER — Emergency Department (HOSPITAL_COMMUNITY): Payer: Medicare Other

## 2017-08-14 ENCOUNTER — Inpatient Hospital Stay (HOSPITAL_COMMUNITY)
Admission: EM | Admit: 2017-08-14 | Discharge: 2017-08-16 | DRG: 871 | Disposition: A | Discharge status: Skilled Nursing Facility | Payer: Medicare Other | Attending: Family Medicine | Admitting: Family Medicine

## 2017-08-14 ENCOUNTER — Encounter (HOSPITAL_COMMUNITY): Payer: Self-pay

## 2017-08-14 ENCOUNTER — Other Ambulatory Visit: Payer: Self-pay

## 2017-08-14 DIAGNOSIS — N3090 Cystitis, unspecified without hematuria: Secondary | ICD-10-CM | POA: Diagnosis present

## 2017-08-14 DIAGNOSIS — H5702 Anisocoria: Secondary | ICD-10-CM

## 2017-08-14 DIAGNOSIS — G894 Chronic pain syndrome: Secondary | ICD-10-CM | POA: Diagnosis present

## 2017-08-14 DIAGNOSIS — D649 Anemia, unspecified: Secondary | ICD-10-CM | POA: Diagnosis present

## 2017-08-14 DIAGNOSIS — R740 Nonspecific elevation of levels of transaminase and lactic acid dehydrogenase [LDH]: Secondary | ICD-10-CM | POA: Diagnosis present

## 2017-08-14 DIAGNOSIS — K219 Gastro-esophageal reflux disease without esophagitis: Secondary | ICD-10-CM | POA: Diagnosis present

## 2017-08-14 DIAGNOSIS — F0150 Vascular dementia without behavioral disturbance: Secondary | ICD-10-CM | POA: Diagnosis present

## 2017-08-14 DIAGNOSIS — Z8673 Personal history of transient ischemic attack (TIA), and cerebral infarction without residual deficits: Secondary | ICD-10-CM | POA: Diagnosis not present

## 2017-08-14 DIAGNOSIS — Z886 Allergy status to analgesic agent status: Secondary | ICD-10-CM | POA: Diagnosis not present

## 2017-08-14 DIAGNOSIS — I10 Essential (primary) hypertension: Secondary | ICD-10-CM | POA: Diagnosis present

## 2017-08-14 DIAGNOSIS — E876 Hypokalemia: Secondary | ICD-10-CM | POA: Diagnosis not present

## 2017-08-14 DIAGNOSIS — N390 Urinary tract infection, site not specified: Secondary | ICD-10-CM | POA: Diagnosis not present

## 2017-08-14 DIAGNOSIS — Z885 Allergy status to narcotic agent status: Secondary | ICD-10-CM

## 2017-08-14 DIAGNOSIS — F419 Anxiety disorder, unspecified: Secondary | ICD-10-CM | POA: Diagnosis present

## 2017-08-14 DIAGNOSIS — G9341 Metabolic encephalopathy: Secondary | ICD-10-CM | POA: Diagnosis present

## 2017-08-14 DIAGNOSIS — D631 Anemia in chronic kidney disease: Secondary | ICD-10-CM | POA: Diagnosis present

## 2017-08-14 DIAGNOSIS — G9340 Encephalopathy, unspecified: Secondary | ICD-10-CM

## 2017-08-14 DIAGNOSIS — A419 Sepsis, unspecified organism: Principal | ICD-10-CM | POA: Diagnosis present

## 2017-08-14 DIAGNOSIS — I129 Hypertensive chronic kidney disease with stage 1 through stage 4 chronic kidney disease, or unspecified chronic kidney disease: Secondary | ICD-10-CM | POA: Diagnosis present

## 2017-08-14 DIAGNOSIS — G629 Polyneuropathy, unspecified: Secondary | ICD-10-CM | POA: Diagnosis present

## 2017-08-14 DIAGNOSIS — D508 Other iron deficiency anemias: Secondary | ICD-10-CM | POA: Diagnosis not present

## 2017-08-14 DIAGNOSIS — R4689 Other symptoms and signs involving appearance and behavior: Secondary | ICD-10-CM

## 2017-08-14 DIAGNOSIS — N183 Chronic kidney disease, stage 3 (moderate): Secondary | ICD-10-CM | POA: Diagnosis present

## 2017-08-14 DIAGNOSIS — R410 Disorientation, unspecified: Secondary | ICD-10-CM | POA: Diagnosis present

## 2017-08-14 DIAGNOSIS — N179 Acute kidney failure, unspecified: Secondary | ICD-10-CM | POA: Diagnosis present

## 2017-08-14 LAB — COMPREHENSIVE METABOLIC PANEL
ALT: 15 U/L (ref 14–54)
AST: 42 U/L — AB (ref 15–41)
Albumin: 3 g/dL — ABNORMAL LOW (ref 3.5–5.0)
Alkaline Phosphatase: 68 U/L (ref 38–126)
Anion gap: 8 (ref 5–15)
BILIRUBIN TOTAL: 0.7 mg/dL (ref 0.3–1.2)
BUN: 15 mg/dL (ref 6–20)
CALCIUM: 8.7 mg/dL — AB (ref 8.9–10.3)
CO2: 26 mmol/L (ref 22–32)
CREATININE: 1.28 mg/dL — AB (ref 0.44–1.00)
Chloride: 104 mmol/L (ref 101–111)
GFR, EST AFRICAN AMERICAN: 41 mL/min — AB (ref >60)
GFR, EST NON AFRICAN AMERICAN: 36 mL/min — AB (ref >60)
Glucose, Bld: 129 mg/dL — ABNORMAL HIGH (ref 65–99)
Potassium: 4.5 mmol/L (ref 3.5–5.1)
Sodium: 138 mmol/L (ref 135–145)
TOTAL PROTEIN: 9.1 g/dL — AB (ref 6.5–8.1)

## 2017-08-14 LAB — CBG MONITORING, ED: Glucose-Capillary: 97 mg/dL (ref 65–99)

## 2017-08-14 LAB — URINALYSIS, ROUTINE W REFLEX MICROSCOPIC
GLUCOSE, UA: NEGATIVE mg/dL
KETONES UR: NEGATIVE mg/dL
Nitrite: NEGATIVE
PH: 5 (ref 5.0–8.0)
Protein, ur: 100 mg/dL — AB
SPECIFIC GRAVITY, URINE: 1.024 (ref 1.005–1.030)

## 2017-08-14 LAB — CBC WITH DIFFERENTIAL/PLATELET
BASOS ABS: 0 10*3/uL (ref 0.0–0.1)
Basophils Relative: 0 %
EOS ABS: 0.3 10*3/uL (ref 0.0–0.7)
Eosinophils Relative: 3 %
HCT: 34.2 % — ABNORMAL LOW (ref 36.0–46.0)
Hemoglobin: 11 g/dL — ABNORMAL LOW (ref 12.0–15.0)
LYMPHS ABS: 3.1 10*3/uL (ref 0.7–4.0)
Lymphocytes Relative: 27 %
MCH: 29.2 pg (ref 26.0–34.0)
MCHC: 32.2 g/dL (ref 30.0–36.0)
MCV: 90.7 fL (ref 78.0–100.0)
MONO ABS: 1.2 10*3/uL — AB (ref 0.1–1.0)
Monocytes Relative: 10 %
NEUTROS PCT: 60 %
Neutro Abs: 6.9 10*3/uL (ref 1.7–7.7)
PLATELETS: 269 10*3/uL (ref 150–400)
RBC: 3.77 MIL/uL — AB (ref 3.87–5.11)
RDW: 15.5 % (ref 11.5–15.5)
WBC: 11.5 10*3/uL — AB (ref 4.0–10.5)

## 2017-08-14 LAB — I-STAT CG4 LACTIC ACID, ED
LACTIC ACID, VENOUS: 1.87 mmol/L (ref 0.5–1.9)
Lactic Acid, Venous: 2.4 mmol/L (ref 0.5–1.9)

## 2017-08-14 LAB — I-STAT TROPONIN, ED: Troponin i, poc: 0.01 ng/mL (ref 0.00–0.08)

## 2017-08-14 LAB — PROTIME-INR
INR: 1.1
PROTHROMBIN TIME: 14.1 s (ref 11.4–15.2)

## 2017-08-14 MED ORDER — FERROUS SULFATE 325 (65 FE) MG PO TABS
325.0000 mg | ORAL_TABLET | Freq: Every day | ORAL | Status: DC
  Administered 2017-08-15 – 2017-08-16 (×2): 325 mg via ORAL
  Filled 2017-08-14 (×2): qty 1

## 2017-08-14 MED ORDER — DEXTROSE 5 % IV SOLN
1.0000 g | Freq: Once | INTRAVENOUS | Status: AC
  Administered 2017-08-14: 1 g via INTRAVENOUS
  Filled 2017-08-14: qty 10

## 2017-08-14 MED ORDER — LACTULOSE 10 GM/15ML PO SOLN
40.0000 g | Freq: Four times a day (QID) | ORAL | Status: DC
  Administered 2017-08-15 – 2017-08-16 (×3): 40 g via ORAL
  Filled 2017-08-14 (×4): qty 60

## 2017-08-14 MED ORDER — DEXTROSE 5 % IV SOLN
1.0000 g | INTRAVENOUS | Status: DC
  Administered 2017-08-15: 1 g via INTRAVENOUS
  Filled 2017-08-14 (×2): qty 10

## 2017-08-14 MED ORDER — ACETAMINOPHEN 325 MG PO TABS: 650.0000 mg | ORAL_TABLET | Freq: Four times a day (QID) | ORAL | Status: DC | PRN

## 2017-08-14 MED ORDER — ONDANSETRON HCL 4 MG PO TABS: 4.0000 mg | ORAL_TABLET | Freq: Four times a day (QID) | ORAL | Status: DC | PRN

## 2017-08-14 MED ORDER — ALBUTEROL SULFATE (2.5 MG/3ML) 0.083% IN NEBU: 2.5000 mg | INHALATION_SOLUTION | RESPIRATORY_TRACT | Status: DC | PRN

## 2017-08-14 MED ORDER — ARTIFICIAL TEARS OPHTHALMIC OINT
1.0000 "application " | TOPICAL_OINTMENT | Freq: Every day | OPHTHALMIC | Status: DC
  Administered 2017-08-15: 1 via OPHTHALMIC
  Filled 2017-08-14: qty 3.5

## 2017-08-14 MED ORDER — SODIUM CHLORIDE 0.9 % IV BOLUS (SEPSIS)
1000.0000 mL | Freq: Once | INTRAVENOUS | Status: AC
Start: 2017-08-14 — End: 2017-08-14
  Administered 2017-08-14: 1000 mL via INTRAVENOUS

## 2017-08-14 MED ORDER — ONDANSETRON HCL 4 MG/2ML IJ SOLN: 4.0000 mg | Freq: Four times a day (QID) | INTRAMUSCULAR | Status: DC | PRN

## 2017-08-14 MED ORDER — ACETAMINOPHEN 650 MG RE SUPP
650.0000 mg | Freq: Once | RECTAL | Status: AC
  Administered 2017-08-14: 650 mg via RECTAL
  Filled 2017-08-14: qty 1

## 2017-08-14 MED ORDER — ACETAMINOPHEN 650 MG RE SUPP: 650.0000 mg | Freq: Four times a day (QID) | RECTAL | Status: DC | PRN

## 2017-08-14 MED ORDER — ACETAMINOPHEN 325 MG PO TABS
650.0000 mg | ORAL_TABLET | Freq: Once | ORAL | Status: DC
  Filled 2017-08-14: qty 2

## 2017-08-14 MED ORDER — DULOXETINE HCL 30 MG PO CPEP
30.0000 mg | ORAL_CAPSULE | Freq: Two times a day (BID) | ORAL | Status: DC
  Administered 2017-08-15 – 2017-08-16 (×3): 30 mg via ORAL
  Filled 2017-08-14 (×3): qty 1

## 2017-08-14 MED ORDER — HYDROXYZINE HCL 25 MG PO TABS: 25.0000 mg | ORAL_TABLET | ORAL | Status: DC

## 2017-08-14 MED ORDER — AMLODIPINE BESYLATE 5 MG PO TABS
5.0000 mg | ORAL_TABLET | Freq: Every day | ORAL | Status: DC
  Administered 2017-08-15 – 2017-08-16 (×2): 5 mg via ORAL
  Filled 2017-08-14 (×2): qty 1

## 2017-08-14 MED ORDER — ENSURE ENLIVE PO LIQD
Freq: Three times a day (TID) | ORAL | Status: DC
  Administered 2017-08-15: 237 mL via ORAL
  Administered 2017-08-15: 10:00:00 via ORAL
  Administered 2017-08-16: 237 mL via ORAL

## 2017-08-14 MED ORDER — FAMOTIDINE 20 MG PO TABS
20.0000 mg | ORAL_TABLET | Freq: Every day | ORAL | Status: DC
  Administered 2017-08-15: 20 mg via ORAL
  Filled 2017-08-14: qty 1

## 2017-08-14 MED ORDER — SODIUM CHLORIDE 0.9 % IV SOLN
INTRAVENOUS | Status: DC
Start: 2017-08-14 — End: 2017-08-16
  Administered 2017-08-15 (×2): via INTRAVENOUS

## 2017-08-14 MED ORDER — GABAPENTIN 600 MG PO TABS
600.0000 mg | ORAL_TABLET | Freq: Three times a day (TID) | ORAL | Status: DC
  Administered 2017-08-15 – 2017-08-16 (×4): 600 mg via ORAL
  Filled 2017-08-14 (×4): qty 1

## 2017-08-14 MED ORDER — ACETAMINOPHEN 650 MG RE SUPP: 650.0000 mg | Freq: Once | RECTAL | Status: DC

## 2017-08-14 MED ORDER — BUSPIRONE HCL 5 MG PO TABS
5.0000 mg | ORAL_TABLET | Freq: Two times a day (BID) | ORAL | Status: DC
  Administered 2017-08-15 – 2017-08-16 (×3): 5 mg via ORAL
  Filled 2017-08-14 (×3): qty 1

## 2017-08-14 MED ORDER — ENOXAPARIN SODIUM 30 MG/0.3ML ~~LOC~~ SOLN
30.0000 mg | SUBCUTANEOUS | Status: DC
Start: 2017-08-15 — End: 2017-08-16
  Administered 2017-08-15: 30 mg via SUBCUTANEOUS
  Filled 2017-08-14: qty 0.3

## 2017-08-14 MED ORDER — METHOCARBAMOL 500 MG PO TABS
500.0000 mg | ORAL_TABLET | Freq: Two times a day (BID) | ORAL | Status: DC
  Administered 2017-08-15 – 2017-08-16 (×3): 500 mg via ORAL
  Filled 2017-08-14 (×3): qty 1

## 2017-08-14 MED ORDER — LOPERAMIDE HCL 2 MG PO CAPS: 2.0000 mg | ORAL_CAPSULE | Freq: Four times a day (QID) | ORAL | Status: DC | PRN

## 2017-08-14 NOTE — ED Notes (Signed)
IV team at bedside 

## 2017-08-14 NOTE — ED Notes (Addendum)
Admitting at the bedside. No paperwork found with patient, no paperwork given to this RN from [place], [place] after brief report. Unsure of pt code status or other information. This RN on the phone with [employer]

## 2017-08-14 NOTE — ED Provider Notes (Signed)
83 yo F sent here from [facility] rehab for evaluation of possible unequal pupil. On my exam, suspect this is 2/2 iris defect from cataract survey - pupil is still reactive, but has chronic, old scarring and irregularity with h/o known cataract surgery. No conjunctival injection to suggest acute glaucoma. Pt denies pain. Of note, however, SNF reports pt was sent because she's been more agitated/aggressive lately. Will check lab work for occult infection, CT head given ? Of AMS, and re-evaluation. Exam is not c/w CNIII, IV, or VI palsy.  Lab work, results are c/w likely encephalopathy 2/2 UTI with early sepsis/dehydration. IVF, ABX given. Admit to medicine.   Duffy Bruce, MD 08/15/17 [phone]

## 2017-08-14 NOTE — ED Triage Notes (Addendum)
Pt with HX of CVA currently residing at [facility] was sent to ED for unequal pupils.  NAD noted upon arrival to ED.  VSS.

## 2017-08-14 NOTE — ED Notes (Signed)
This RN just received report on pt. [name], EMT at the bedside obtaining EKG. Phlebotomy to attempt to draw labs.

## 2017-08-14 NOTE — ED Notes (Signed)
CBG taken at 20:58 was 97.
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
## 2017-08-14 NOTE — ED Notes (Signed)
Per [facility], pt is full code. Will fax paperwork to this RN.

## 2017-08-14 NOTE — ED Provider Notes (Signed)
[facility] EMERGENCY DEPARTMENT Provider Note   CSN: [id number] Arrival date & time: 08/14/17  1656     History   Chief Complaint Chief Complaint  Patient presents with  . Neurologic Problem    HPI [name] is a 83 y.o. female with a history of dementia, CVA, HTN, who presents today from her nursing home at [facility] for evaluation.  According to nursing home staff patient normally is very pleasant, calm.  Today she was agitated, being verbally aggressive, spat at a nurse, and is definitely more confused than her normal baseline.  She was also noticed to have unequal pupils and sent here for evaluation.  Patient denies any pain, however her history is significantly limited secondary to dementia and confusion.  HPI  Past Medical History:  Diagnosis Date  . Anemia   . Arthritis    b/l shoulder R>L  . Atrial tachycardia ([place])    echo 04/08/11: EF 55-60%, mild LVH, grade 1 diast dysfxn;    s/p RFCA with Dr. Lovena Le  04/09/11  . Cataracts, bilateral   . Chronic pain syndrome 04/09/2013   On chronic opioids. Cervical MRI 11/ 2012 : Diffuse cervical spondylosis, Diffuse degenerative disc disease with multilevel foraminal stenosis. Possible T1 nerve root involvement. Lumbar MRI 2009 : prior fusion at L3-4 and degenerative disc disease Rotator cuff repair : Dr. Berenice Primas 7/07. Follows with Dr. Tamera Punt for possible shoulder surgery.  H/O lumbar spinal stenosis with steroid injections. Mo  . CVA (cerebral infarction) 04/09/2013   MRI 2005 : Multiple tiny old lacunar infarcts as well as scattered white matter small vessel ischemic disease. Pt asymptomatic and denies TIA / CVA hx.    . Depression   . Essential hypertension 05/15/2007   Controlled with single drug tx    . Fecal occult blood test positive   . GERD (gastroesophageal reflux disease)   . Head ache   . Hemorrhoids   . History of blood transfusion 2014   anemia-   . Hyperkalemia 04/08/2011  . Hypertension   .  Lumbar spinal stenosis    gets Spinal injections by Dr. Marlaine Hind   . Osteoporosis, unspecified 04/09/2013   DEXA 5/07 : T L forearm -2.9. L femur -0.6, R femur -1.4. [name]  . Postnasal drip   . Renal insufficiency, mild   . Restless leg syndrome   . Rotator cuff tear arthropathy    03/22/13 dx [facility] (Dr. Tamera Punt) considering surgery  . Rupture of rotator cuff of shoulder    s/p repair by Dr. Berenice Primas 7/07. Follows with Dr. Tamera Punt for possible shoulder surgery.   . Stroke ([place])   . SVT (supraventricular tachycardia) ([place])   . Unspecified deficiency anemia     Patient Active Problem List   Diagnosis Date Noted  . Vascular dementia without behavioral disturbance 04/25/2016  . Hypokalemia 01/25/2016  . History of stroke 06/02/2015  . Edema 05/09/2014  . Glaucoma 11/30/2013  . Peripheral neuropathy 10/25/2013  . Constipation 10/25/2013  . Spinal cord compression ([place]) 08/01/2013  . Cerebral infarction (Home) 04/09/2013  . Osteoporosis, unspecified 04/09/2013  . Chronic pain syndrome 04/09/2013  . Celiac disease 08/17/2011  . GERD (gastroesophageal reflux disease) 03/21/2011  . Depression 12/07/2009  . Essential hypertension 05/15/2007  . Anemia 03/13/2007    Past Surgical History:  Procedure Laterality Date  . ABDOMINAL HYSTERECTOMY    . BACK SURGERY     lumbar x 2, 2000 and 2004 by  Dr. Deri Fuelling.   Marland Kitchen DOPPLER ECHOCARDIOGRAPHY  2004  . EYE SURGERY Bilateral    cataract  . ORIF FINGER FRACTURE Left    ring finger  . ROTATOR CUFF REPAIR     right    OB History    No data available       Home Medications    Prior to Admission medications   Medication Sig Start Date End Date Taking? Authorizing Provider  Amino Acids-Protein Hydrolys (FEEDING SUPPLEMENT, PRO-STAT SUGAR FREE 64,) LIQD Take 30 mLs by mouth 2 (two) times daily.    [provider]  amLODipine (NORVASC) 5 MG tablet Take 1 tablet (5 mg total) by mouth daily.  08/09/15   Gerlene Fee, NP  CALCIUM PO Take 600 mg by mouth 2 (two) times daily.     [provider]  carboxymethylcellulose (REFRESH PLUS) 0.5 % SOLN Place 1 drop into both eyes 2 (two) times daily.     [provider]  cholecalciferol (VITAMIN D) 1000 units tablet Take 1,000 Units by mouth daily.    [provider]  ferrous sulfate 325 (65 FE) MG tablet Take 325 mg by mouth daily with breakfast.    [provider]  gabapentin (NEURONTIN) 600 MG tablet Take 600 mg by mouth 3 (three) times daily.    [provider]  loperamide (IMODIUM) 2 MG capsule Take 2 mg by mouth every 6 (six) hours as needed for diarrhea or loose stools.     [provider]  Menthol, Topical Analgesic, (BIOFREEZE) 4 % GEL Apply topically. Apply to both knees three times daily, apply to right shoulder three times daily    [provider]  methocarbamol (ROBAXIN) 500 MG tablet Take 500 mg by mouth 2 (two) times daily.    [provider]  Multiple Vitamins-Minerals (DECUBI-VITE) CAPS Take 1 capsule by mouth daily.    [provider]  Nutritional Supplements (NUTRITIONAL DRINK PO) Take 120 mLs by mouth 3 (three) times daily. Med Pass    [provider]  polyethylene glycol (MIRALAX / GLYCOLAX) packet Take 17 g by mouth daily.    [provider]  ranitidine (ZANTAC) 150 MG tablet Take 150 mg by mouth at bedtime.    [provider]  sertraline (ZOLOFT) 25 MG tablet Take 25 mg by mouth daily. Give with 50 mg tab to equal 75 mg    [provider]  sertraline (ZOLOFT) 50 MG tablet Take 50 mg by mouth daily. Give with 25 mg tab to equal 75 mg total.    [provider]  traMADol (ULTRAM) 50 MG tablet Take one tablet by mouth three times daily as needed for pain. Hold if sleepy 03/27/17   Gayland Curry, DO    Family History Family History  Problem Relation Age of Onset  . Coronary artery disease Neg Hx          premature    Social History Social History   Tobacco Use  . Smoking status: Never Smoker  . Smokeless tobacco: Never Used  Substance Use Topics  . Alcohol use: No  . Drug use: Yes    Types: Methamphetamines     Allergies   Codeine and Ibuprofen   Review of Systems Review of Systems  Unable to perform ROS: Dementia     Physical Exam Updated Vital Signs BP (!) 158/80 (BP Location: Left Arm)   Pulse 93   Temp 99.3 F (37.4 C) (Oral)   Resp 16  Ht 5' (1.524 m)   Wt 71.7 kg (158 lb)   SpO2 98%   BMI 30.86 kg/m   Physical Exam  Constitutional: She appears well-developed and well-nourished. No distress.  HENT:  Head: Normocephalic and atraumatic.  Mouth/Throat: Oropharynx is clear and moist.  Eyes: Conjunctivae are normal.  Left pupil is larger than right and appears off center in iris.  Appears consistent with previous cataract surgery.  Patient can count fingers in all 4 peripheral fields in bilateral eyes.  Unable to accurately assess EOM secondary to patient confusion.   Neck: Normal range of motion. Neck supple. No JVD present. No tracheal deviation present.  Cardiovascular: Normal rate, regular rhythm, normal heart sounds and intact distal pulses.  No murmur heard. Pulmonary/Chest: Effort normal and breath sounds normal. No respiratory distress.  Abdominal: Soft. Bowel sounds are normal. There is no tenderness.  Musculoskeletal: She exhibits no edema or deformity.  Neurological: She is alert.  Oriented to person, not place or time.  She mumbles and is not able to follow multi-part commands.  Grip strength 5/5 bilaterally.  No obvious facial droop.  Moves all extremities on command and spontaneously.  Remainder of neuro exam limited secondary to dementia, inability to follow complex commands.   Skin: Skin is warm and dry. She is not diaphoretic.  Nursing note and vitals reviewed.    ED Treatments / Results  Labs (all labs ordered are listed, but only  abnormal results are displayed) Labs Reviewed  COMPREHENSIVE METABOLIC PANEL - Abnormal; Notable for the following components:      Result Value   Glucose, Bld 129 (*)    Creatinine, Ser 1.28 (*)    Calcium 8.7 (*)    Total Protein 9.1 (*)    Albumin 3.0 (*)    AST 42 (*)    GFR calc non Af Amer 36 (*)    GFR calc Af Amer 41 (*)    All other components within normal limits  CBC WITH DIFFERENTIAL/PLATELET - Abnormal; Notable for the following components:   WBC 11.5 (*)    RBC 3.77 (*)    Hemoglobin 11.0 (*)    HCT 34.2 (*)    Monocytes Absolute 1.2 (*)    All other components within normal limits  URINALYSIS, ROUTINE W REFLEX MICROSCOPIC - Abnormal; Notable for the following components:   Color, Urine AMBER (*)    APPearance CLOUDY (*)    Hgb urine dipstick SMALL (*)    Bilirubin Urine SMALL (*)    Protein, ur 100 (*)    Leukocytes, UA LARGE (*)    Bacteria, UA MANY (*)    Squamous Epithelial / LPF 0-5 (*)    All other components within normal limits  CBC - Abnormal; Notable for the following components:   RBC 3.55 (*)    Hemoglobin 10.6 (*)    HCT 32.1 (*)    All other components within normal limits  BASIC METABOLIC PANEL - Abnormal; Notable for the following components:   Potassium 3.3 (*)    Calcium 7.9 (*)    GFR calc non Af Amer 51 (*)    GFR calc Af Amer 59 (*)    All other components within normal limits  AMMONIA - Abnormal; Notable for the following components:   Ammonia 37 (*)    All other components within normal limits  MAGNESIUM - Abnormal; Notable for the following components:   Magnesium 1.4 (*)    All other components within normal limits  I-STAT  CG4 LACTIC ACID, ED - Abnormal; Notable for the following components:   Lactic Acid, Venous 2.40 (*)    All other components within normal limits  URINE CULTURE  CULTURE, BLOOD (ROUTINE X 2)  CULTURE, BLOOD (ROUTINE X 2)  MRSA PCR SCREENING  PROTIME-INR  I-STAT TROPONIN, ED  I-STAT CG4 LACTIC ACID, ED    CBG MONITORING, ED  I-STAT CG4 LACTIC ACID, ED    EKG  EKG Interpretation None       Radiology No results found.  Procedures Procedures (including critical care time)  Medications Ordered in ED Medications - No data to display   Initial Impression / Assessment and Plan / ED Course  I have reviewed the triage vital signs and the nursing notes.  Pertinent labs & imaging results that were available during my care of the patient were reviewed by me and considered in my medical decision making (see chart for details).  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  Normally pupils are equal however today they are unequal.  This is not her normal.  [EH]  2156 Spoke with Dr. Tamala Julian who will come see patient for admission.   [EH]    Clinical Course User Index [EH] Lorin Glass, PA-C [HM] McDonald, Abigail Miyamoto, Student-PA   Timothy Lasso presents today from her nursing home for evaluation of unequal pupils, along with behavioral changes.  She is demented at baseline and is unable to provide any meaningful history.  She was found to be febrile in the emergency room with urine consistent with urinary tract infection.  Lactic was very minimally elevated, she was not tachycardic, tachypneic, or hypotensive.  Code sepsis was considered, however I do not feel like she meets criteria.  While her left pupil is larger than her right, this appears to be postoperative as she has had bilateral cataract surgeries.  I do not feel the patient meets code stroke criteria, as it is unknown how long this pupil abnormality has been present, she has intact visual fields, and this may very well be chronic and just not noticed until she was having behavioral changes.  CT head was obtained without acute  abnormalities, will defer additional workup as needed to admitting team.  Patient was treated with IV Rocephin in the emergency room, hospitalist was consulted for admission who agreed to admit patient.  The patient was seen by Dr. Ellender Hose who evaluated the patient and agreed with my plan.    Final Clinical Impressions(s) / ED Diagnoses   Final diagnoses:  Unequal pupils  Behavioral change  Cystitis  Encephalopathy  Vascular dementia without behavioral disturbance    ED Discharge Orders    None       Ollen Gross 08/15/17 Dierdre Highman    Duffy Bruce, MD 08/15/17 1157

## 2017-08-14 NOTE — Progress Notes (Signed)
Pharmacy Antibiotic Note  [name] is a 83 y.o. female admitted on 08/14/2017 with UTI.  Pharmacy has been consulted for ceftriaxone dosing.  Plan: Ceftriaxone 1g IV q24h F/u clinical progress, c/s, LOT  Height: 5' (152.4 cm) Weight: 158 lb (71.7 kg) IBW/kg (Calculated) : 45.5  Temp (24hrs), Avg:100.3 F (37.9 C), Min:99.3 F (37.4 C), Max:101.2 F (38.4 C)  Recent Labs  Lab 08/14/17 1759 08/14/17 2015  WBC 11.5*  --   CREATININE 1.28*  --   LATICACIDVEN  --  2.40*    Estimated Creatinine Clearance: 25.8 mL/min (A) (by C-G formula based on SCr of 1.28 mg/dL (H)).    Allergies  Allergen Reactions  . Codeine Other (See Comments)    Hallucinations  . Ibuprofen Other (See Comments)    Confusion    Elicia Lamp, PharmD, BCPS Clinical Pharmacist 08/14/2017 10:14 PM

## 2017-08-15 ENCOUNTER — Other Ambulatory Visit: Payer: Self-pay

## 2017-08-15 ENCOUNTER — Encounter (HOSPITAL_COMMUNITY): Payer: Self-pay | Admitting: *Deleted

## 2017-08-15 DIAGNOSIS — A419 Sepsis, unspecified organism: Principal | ICD-10-CM

## 2017-08-15 DIAGNOSIS — N390 Urinary tract infection, site not specified: Secondary | ICD-10-CM

## 2017-08-15 LAB — BASIC METABOLIC PANEL
ANION GAP: 8 (ref 5–15)
BUN: 12 mg/dL (ref 6–20)
CALCIUM: 7.9 mg/dL — AB (ref 8.9–10.3)
CO2: 23 mmol/L (ref 22–32)
Chloride: 106 mmol/L (ref 101–111)
Creatinine, Ser: 0.96 mg/dL (ref 0.44–1.00)
GFR calc Af Amer: 59 mL/min — ABNORMAL LOW (ref >60)
GFR calc non Af Amer: 51 mL/min — ABNORMAL LOW (ref >60)
GLUCOSE: 94 mg/dL (ref 65–99)
Potassium: 3.3 mmol/L — ABNORMAL LOW (ref 3.5–5.1)
Sodium: 137 mmol/L (ref 135–145)

## 2017-08-15 LAB — CBC
HEMATOCRIT: 32.1 % — AB (ref 36.0–46.0)
Hemoglobin: 10.6 g/dL — ABNORMAL LOW (ref 12.0–15.0)
MCH: 29.9 pg (ref 26.0–34.0)
MCHC: 33 g/dL (ref 30.0–36.0)
MCV: 90.4 fL (ref 78.0–100.0)
Platelets: 245 10*3/uL (ref 150–400)
RBC: 3.55 MIL/uL — ABNORMAL LOW (ref 3.87–5.11)
RDW: 15.1 % (ref 11.5–15.5)
WBC: 9.9 10*3/uL (ref 4.0–10.5)

## 2017-08-15 LAB — MRSA PCR SCREENING: MRSA BY PCR: POSITIVE — AB

## 2017-08-15 LAB — MAGNESIUM: Magnesium: 1.4 mg/dL — ABNORMAL LOW (ref 1.7–2.4)

## 2017-08-15 LAB — AMMONIA: Ammonia: 37 umol/L — ABNORMAL HIGH (ref 9–35)

## 2017-08-15 MED ORDER — MUPIROCIN 2 % EX OINT
1.0000 | TOPICAL_OINTMENT | Freq: Two times a day (BID) | CUTANEOUS | Status: DC
Start: 2017-08-15 — End: 2017-08-16
  Administered 2017-08-15 – 2017-08-16 (×2): 1 via NASAL
  Filled 2017-08-15 (×2): qty 22

## 2017-08-15 NOTE — Evaluation (Signed)
Clinical/Bedside Swallow Evaluation Patient Details  Name: [name] MRN: [id number] Date of Birth: Apr 09, 1925  Today's Date: 08/15/2017 Time: SLP Start Time (ACUTE ONLY): 0902 SLP Stop Time (ACUTE ONLY): 0921 SLP Time Calculation (min) (ACUTE ONLY): 19 min  Past Medical History:  Past Medical History:  Diagnosis Date  . Anemia   . Arthritis    b/l shoulder R>L  . Atrial tachycardia ([place])    echo 04/08/11: EF 55-60%, mild LVH, grade 1 diast dysfxn;    s/p RFCA with Dr. Lovena Le  04/09/11  . Cataracts, bilateral   . Chronic pain syndrome 04/09/2013   On chronic opioids. Cervical MRI 11/ 2012 : Diffuse cervical spondylosis, Diffuse degenerative disc disease with multilevel foraminal stenosis. Possible T1 nerve root involvement. Lumbar MRI 2009 : prior fusion at L3-4 and degenerative disc disease Rotator cuff repair : Dr. Berenice Primas 7/07. Follows with Dr. Tamera Punt for possible shoulder surgery.  H/O lumbar spinal stenosis with steroid injections. Mo  . CVA (cerebral infarction) 04/09/2013   MRI 2005 : Multiple tiny old lacunar infarcts as well as scattered white matter small vessel ischemic disease. Pt asymptomatic and denies TIA / CVA hx.    . Depression   . Essential hypertension 05/15/2007   Controlled with single drug tx    . Fecal occult blood test positive   . GERD (gastroesophageal reflux disease)   . Head ache   . Hemorrhoids   . History of blood transfusion 2014   anemia-   . Hyperkalemia 04/08/2011  . Hypertension   . Lumbar spinal stenosis    gets Spinal injections by Dr. Marlaine Hind   . Osteoporosis, unspecified 04/09/2013   DEXA 5/07 : T L forearm -2.9. L femur -0.6, R femur -1.4. [name]  . Postnasal drip   . Renal insufficiency, mild   . Restless leg syndrome   . Rotator cuff tear arthropathy    03/22/13 dx [facility] (Dr. Tamera Punt) considering surgery  . Rupture of rotator cuff of shoulder    s/p repair by Dr. Berenice Primas 7/07. Follows with Dr. Tamera Punt  for possible shoulder surgery.   . Stroke ([address])   . SVT (supraventricular tachycardia) ([place])   . Unspecified deficiency anemia    Past Surgical History:  Past Surgical History:  Procedure Laterality Date  . ABDOMINAL HYSTERECTOMY    . BACK SURGERY     lumbar x 2, 2000 and 2004 by Dr. Deri Fuelling.   Marland Kitchen DOPPLER ECHOCARDIOGRAPHY  2004  . EYE SURGERY Bilateral    cataract  . ORIF FINGER FRACTURE Left    ring finger  . ROTATOR CUFF REPAIR     right   HPI:  Pt is a89 y.o.femaleadmitted from SNF with AMS likely due to sepsis from UTI. CXR and CT Head without acute changes upon admission. PMH includesHTN, CVA, dementia,chronic pain, CKD, GERD, cataracts   Assessment / Plan / Recommendation Clinical Impression  Pt has limited dentition which prolongs oral preparation and allows for oral residue. Pt's awareness of her oral residue is limited - she makes no spontaneous attempts to clear it, and she attempts to talk with moderate amounts of food remaining in her oral cavity. Pureed and liquid washes assist in clearing residuals. Recommend Dys 2 diet and thin liquids with assistance during meals. SLP will f/u briefly. SLP Visit Diagnosis: Dysphagia, oral phase (R13.11)    Aspiration Risk  Mild aspiration risk    Diet Recommendation Dysphagia 2 (Fine chop);Thin liquid   Liquid  Administration via: Cup;Straw Medication Administration: Whole meds with puree Supervision: Staff to assist with self feeding;Full supervision/cueing for compensatory strategies Compensations: Slow rate;Small sips/bites;Minimize environmental distractions;Follow solids with liquid Postural Changes: Seated upright at 90 degrees;Remain upright for at least 30 minutes after po intake    Other  Recommendations Oral Care Recommendations: Oral care BID   Follow up Recommendations Skilled Nursing facility      Frequency and Duration min 2x/week  1 week       Prognosis Prognosis for Safe Diet Advancement:  Good Barriers to Reach Goals: Other (Comment)(access to dentures at SNF?)      Swallow Study   General HPI: Pt is a89 y.o.femaleadmitted from SNF with AMS likely due to sepsis from UTI. CXR and CT Head without acute changes upon admission. PMH includesHTN, CVA, dementia,chronic pain, CKD, GERD, cataracts Type of Study: Bedside Swallow Evaluation Previous Swallow Assessment: none in chart Diet Prior to this Study: Regular;Thin liquids Temperature Spikes Noted: Yes(101.2) Respiratory Status: Room air History of Recent Intubation: No Behavior/Cognition: Alert;Cooperative;Pleasant mood;Requires cueing Oral Cavity Assessment: Dry Oral Care Completed by SLP: No Oral Cavity - Dentition: Poor condition;Missing dentition;Dentures, not available Self-Feeding Abilities: Needs assist Patient Positioning: Upright in bed Baseline Vocal Quality: Normal;Other (comment)(but speech slurred)    Oral/Motor/Sensory Function Overall Oral Motor/Sensory Function: (difficulty following commands to assess)   Ice Chips Ice chips: Not tested   Thin Liquid Thin Liquid: Within functional limits Presentation: Cup;Self Fed;Straw    Nectar Thick Nectar Thick Liquid: Not tested   Honey Thick Honey Thick Liquid: Not tested   Puree Puree: Within functional limits Presentation: Spoon   Solid   GO   Solid: Impaired Oral Phase Impairments: Impaired mastication Oral Phase Functional Implications: Oral residue        Germain Osgood 08/15/2017,10:08 AM  Germain Osgood, M.A. CCC-SLP [phone]

## 2017-08-15 NOTE — Progress Notes (Signed)
PROGRESS NOTE [facility]   [name]   ZDG:[id number] DOB: [birth year]  DOA: 08/14/2017 PCP: Jodi Marble, MD   Brief Narrative:  [name] is a 83 year old female with medical history significant for hypertension, CVA, dementia, chronic kidney disease who was sent from her SNF after being found more confused from her baseline.  She was also noted to be agitated when she abnormalities, and pleasant.  Upon ED evaluation she was found to be febrile up to 101.2, UA grossly abnormal for infection, elevated white count and lactic acid of 2.4. Patient was admitted with working diagnosis of acute metabolic encephalopathy due to UTI and she was started on empiric antibiotic.  Subjective: Patient seen and examined, she is confused, she is oriented to person only.  Denies any chest pain, shortness of breath, abdominal pain and nausea or vomiting.  Assessment & Plan: Sepsis secondary to urinary tract infection Sepsis physiology has improved  Patient started on empiric abx, Rocephin, pending urine cultures Blood cultures no growth thus far   Acute metabolic encephalopathy secondary to UTI In setting of dementia, patient oriented to person only, unclear baseline  Treat underlying causes   Essential hypertension Bp stable  Continue home medications - amlodipine   Chronic kidney disease stage III Baseline Cr ~ 1. Patient at baseline  Continue to monitor Check BMP in AM   Dementia Monitor for sundowning  Not on any medications   Chronic pain syndrome/peripheral neuropathy Continue Cymbalta and gabapentin   DVT prophylaxis: Lovenox  Code Status: Full  Family Communication: None at bedside  Disposition Plan: back to SNF when medically stable    Consultants:   None   Procedures:   None   Antimicrobials:  Rocephin 11/5 >>>    Objective: Vitals:   08/14/17 2300 08/15/17 0007 08/15/17 0609 08/15/17 0827  BP: (!) 106/92 (!) 146/64 140/62 (!) 122/59  Pulse:  97 87 88 91  Resp: 13 15 16 17   Temp:  98.6 F (37 C) 98.5 F (36.9 C) 98.4 F (36.9 C)  TempSrc:  Oral Oral Oral  SpO2: 99% 98% 98% 98%  Weight:  71 kg (156 lb 8.4 oz)    Height:  5\' 1"  (1.549 m)      Intake/Output Summary (Last 24 hours) at 08/15/2017 1548 Last data filed at 08/15/2017 1400 Gross per 24 hour  Intake 1906.5 ml  Output 1100 ml  Net 806.5 ml   Filed Weights   08/14/17 1700 08/15/17 0007  Weight: 71.7 kg (158 lb) 71 kg (156 lb 8.4 oz)    Examination:  General exam: NAD  Respiratory system: Clear to auscultation. No wheezes,crackle or rhonchi Cardiovascular system: S1 & S2 heard, RRR. No JVD, murmurs, rubs or gallops Gastrointestinal system: Abdomen is nondistended, soft and nontender. No organomegaly or masses felt. Normal bowel sounds heard. Central nervous system: Confused, oriented to person only. No focal deficits  Extremities: No pedal edema.  Skin: No rashes Psychiatry: Unable to evaluate   Data Reviewed: I have personally reviewed following labs and imaging studies  CBC: Recent Labs  Lab 08/14/17 1759 08/15/17 0015  WBC 11.5* 9.9  NEUTROABS 6.9  --   HGB 11.0* 10.6*  HCT 34.2* 32.1*  MCV 90.7 90.4  PLT 269 951   Basic Metabolic Panel: Recent Labs  Lab 08/14/17 1759 08/15/17 0015  NA 138 137  K 4.5 3.3*  CL 104 106  CO2 26 23  GLUCOSE 129* 94  BUN 15 12  CREATININE 1.28*  0.96  CALCIUM 8.7* 7.9*  MG  --  1.4*   GFR: Estimated Creatinine Clearance: 35.1 mL/min (by C-G formula based on SCr of 0.96 mg/dL). Liver Function Tests: Recent Labs  Lab 08/14/17 1759  AST 42*  ALT 15  ALKPHOS 68  BILITOT 0.7  PROT 9.1*  ALBUMIN 3.0*   No results for input(s): LIPASE, AMYLASE in the last 168 hours. Recent Labs  Lab 08/15/17 0015  AMMONIA 37*   Coagulation Profile: Recent Labs  Lab 08/14/17 1944  INR 1.10   Cardiac Enzymes: No results for input(s): CKTOTAL, CKMB, CKMBINDEX, TROPONINI in the last 168 hours. BNP (last 3  results) No results for input(s): PROBNP in the last 8760 hours. HbA1C: No results for input(s): HGBA1C in the last 72 hours. CBG: Recent Labs  Lab 08/14/17 2057  GLUCAP 97   Lipid Profile: No results for input(s): CHOL, HDL, LDLCALC, TRIG, CHOLHDL, LDLDIRECT in the last 72 hours. Thyroid Function Tests: No results for input(s): TSH, T4TOTAL, FREET4, T3FREE, THYROIDAB in the last 72 hours. Anemia Panel: No results for input(s): VITAMINB12, FOLATE, FERRITIN, TIBC, IRON, RETICCTPCT in the last 72 hours. Sepsis Labs: Recent Labs  Lab 08/14/17 2015 08/14/17 2300  LATICACIDVEN 2.40* 1.87    Recent Results (from the past 240 hour(s))  Blood culture (routine x 2)     Status: None (Preliminary result)   Collection Time: 08/14/17  8:25 PM  Result Value Ref Range Status   Specimen Description BLOOD BLOOD RIGHT FOREARM  Final   Special Requests   Final    BOTTLES DRAWN AEROBIC AND ANAEROBIC Blood Culture adequate volume   Culture NO GROWTH < 24 HOURS  Final   Report Status PENDING  Incomplete  Blood culture (routine x 2)     Status: None (Preliminary result)   Collection Time: 08/14/17  8:46 PM  Result Value Ref Range Status   Specimen Description BLOOD LEFT HAND  Final   Special Requests IN PEDIATRIC BOTTLE Blood Culture adequate volume  Final   Culture NO GROWTH < 24 HOURS  Final   Report Status PENDING  Incomplete  MRSA PCR Screening     Status: Abnormal   Collection Time: 08/15/17 12:16 AM  Result Value Ref Range Status   MRSA by PCR POSITIVE (A) NEGATIVE Final    Comment:        The GeneXpert MRSA Assay (FDA approved for NASAL specimens only), is one component of a comprehensive MRSA colonization surveillance program. It is not intended to diagnose MRSA infection nor to guide or monitor treatment for MRSA infections. RESULT CALLED TO, READ BACK BY AND VERIFIED WITH: C. James Ivanoff RN 14:15 08/15/17 ([email or web address])      Radiology Studies: Dg Chest 2 View  Result Date:  08/14/2017 CLINICAL DATA:  Unequal pupils and confusion. EXAM: CHEST  2 VIEW COMPARISON:  12/19/2015, 10/29/2014 FINDINGS: Lungs are adequately inflated without consolidation or effusion. Cardiomediastinal silhouette is within normal. Degenerative change of the spine. Calcified plaque over the thoracic aorta. Moderate degenerate change of the shoulders. IMPRESSION: No active cardiopulmonary disease. Electronically Signed   By: Marin Olp M.D.   On: 08/14/2017 18:58   Ct Head Wo Contrast  Result Date: 08/14/2017 CLINICAL DATA:  Pupillary dysfunction EXAM: CT HEAD WITHOUT CONTRAST TECHNIQUE: Contiguous axial images were obtained from the base of the skull through the vertex without intravenous contrast. COMPARISON:  CT 06/07/2017 FINDINGS: Brain: No mass lesion, intraparenchymal hemorrhage or extra-axial collection. No evidence of acute cortical infarct. There is periventricular hypoattenuation  compatible with chronic microvascular disease. Vascular: No hyperdense vessel or unexpected calcification. Skull: Normal visualized skull base, calvarium and extracranial soft tissues. Sinuses/Orbits: No sinus fluid levels or advanced mucosal thickening. No mastoid effusion. Normal orbits. IMPRESSION: Chronic ischemic microangiopathy without acute abnormality. Electronically Signed   By: Ulyses Jarred M.D.   On: 08/14/2017 21:13     Scheduled Meds: . amLODipine  5 mg Oral Daily  . artificial tears  1 application Both Eyes QHS  . busPIRone  5 mg Oral BID  . DULoxetine  30 mg Oral BID  . enoxaparin (LOVENOX) injection  30 mg Subcutaneous Q24H  . famotidine  20 mg Oral QHS  . feeding supplement (ENSURE ENLIVE)   Oral TID  . ferrous sulfate  325 mg Oral Daily  . gabapentin  600 mg Oral TID  . lactulose  40 g Oral QID  . methocarbamol  500 mg Oral Q12H  . mupirocin ointment  1 application Nasal BID   Continuous Infusions: . sodium chloride 75 mL/hr at 08/15/17 1403  . cefTRIAXone (ROCEPHIN)  IV        LOS: 1 day    Time spent: Total of 25 minutes spent with pt, greater than 50% of which was spent in discussion of  treatment, counseling and coordination of care   Chipper Oman, MD Pager: Text Page via [email or web address]   If 7PM-7AM, please contact night-coverage [email or web address] 08/15/2017, 3:48 PM

## 2017-08-15 NOTE — Progress Notes (Signed)
Patient up all night. Remained confused x 3.

## 2017-08-15 NOTE — NC FL2 (Signed)
[place] LEVEL OF CARE SCREENING TOOL     IDENTIFICATION  Patient Name: [name] Birthdate: April 03, 1925 Sex: female Admission Date (Current Location): 08/14/2017  [facility] and [place] Number:  Herbalist and Address:  [facility], [place] [address] [place], [place] [address]      Provider Number: [id number]  Attending Physician Name and Address:  Norval Morton, MD  Relative Name and Phone Number:       Current Level of Care: Hospital Recommended Level of Care: [place] Prior Approval Number:    Date Approved/Denied:   PASRR Number:    Discharge Plan: SNF    Current Diagnoses: Patient Active Problem List   Diagnosis Date Noted  . Sepsis secondary to UTI ([place]) 08/14/2017  . Vascular dementia without behavioral disturbance 04/25/2016  . Hypokalemia 01/25/2016  . History of stroke 06/02/2015  . Edema 05/09/2014  . Glaucoma 11/30/2013  . Peripheral neuropathy 10/25/2013  . Constipation 10/25/2013  . Spinal cord compression ([place]) 08/01/2013  . Cerebral infarction ([place]) 04/09/2013  . Osteoporosis, unspecified 04/09/2013  . Chronic pain syndrome 04/09/2013  . Celiac disease 08/17/2011  . GERD (gastroesophageal reflux disease) 03/21/2011  . Depression 12/07/2009  . Essential hypertension 05/15/2007  . Anemia 03/13/2007    Orientation RESPIRATION BLADDER Height & Weight     Self  Normal Incontinent, External catheter Weight: 156 lb 8.4 oz (71 kg) Height:  5\' 1"  (154.9 cm)  BEHAVIORAL SYMPTOMS/MOOD NEUROLOGICAL BOWEL NUTRITION STATUS      Continent Diet(see DC summary)  AMBULATORY STATUS COMMUNICATION OF NEEDS Skin   Extensive Assist Verbally PU Stage and Appropriate Care   PU Stage 2 Dressing: (located on coccyx- foam dressing changes)                   Personal Care Assistance Level of Assistance  Bathing, Dressing Bathing Assistance: Maximum assistance   Dressing Assistance: Maximum  assistance     Functional Limitations Info             SPECIAL CARE FACTORS FREQUENCY                       Contractures      Additional Factors Info  Code Status, Allergies, Psychotropic Code Status Info: FULL Allergies Info: Codeine, Ibuprofen Psychotropic Info: cymbalta, buspar         Current Medications (08/15/2017):  This is the current hospital active medication list Current Facility-Administered Medications  Medication Dose Route Frequency Provider Last Rate Last Dose  . 0.9 %  sodium chloride infusion   Intravenous Continuous Fuller Plan A, MD 75 mL/hr at 08/15/17 0028    . acetaminophen (TYLENOL) tablet 650 mg  650 mg Oral Q6H PRN Norval Morton, MD       Or  . acetaminophen (TYLENOL) suppository 650 mg  650 mg Rectal Q6H PRN Smith, Rondell A, MD      . albuterol (PROVENTIL) (2.5 MG/3ML) 0.083% nebulizer solution 2.5 mg  2.5 mg Nebulization Q4H PRN Smith, Rondell A, MD      . amLODipine (NORVASC) tablet 5 mg  5 mg Oral Daily Smith, Rondell A, MD   5 mg at 08/15/17 1016  . artificial tears (LACRILUBE) ophthalmic ointment 1 application  1 application Both Eyes QHS Smith, Rondell A, MD      . busPIRone (BUSPAR) tablet 5 mg  5 mg Oral BID Norval Morton, MD   5 mg  at 08/15/17 1016  . cefTRIAXone (ROCEPHIN) 1 g in dextrose 5 % 50 mL IVPB  1 g Intravenous Q24H Romona Curls, [employer]      . DULoxetine (CYMBALTA) DR capsule 30 mg  30 mg Oral BID Fuller Plan A, MD   30 mg at 08/15/17 1016  . enoxaparin (LOVENOX) injection 30 mg  30 mg Subcutaneous Q24H Smith, Rondell A, MD   30 mg at 08/15/17 1016  . famotidine (PEPCID) tablet 20 mg  20 mg Oral QHS Smith, Rondell A, MD      . feeding supplement (ENSURE ENLIVE) (ENSURE ENLIVE) liquid   Oral TID Fuller Plan A, MD      . ferrous sulfate tablet 325 mg  325 mg Oral Daily Smith, Rondell A, MD   325 mg at 08/15/17 1016  . gabapentin (NEURONTIN) tablet 600 mg  600 mg Oral TID Fuller Plan A, MD   600 mg at 08/15/17  1016  . lactulose (CHRONULAC) 10 GM/15ML solution 40 g  40 g Oral QID Tamala Julian, Rondell A, MD   40 g at 08/15/17 1016  . loperamide (IMODIUM) capsule 2 mg  2 mg Oral Q6H PRN Fuller Plan A, MD      . methocarbamol (ROBAXIN) tablet 500 mg  500 mg Oral Q12H Smith, Rondell A, MD   500 mg at 08/15/17 1016  . ondansetron (ZOFRAN) tablet 4 mg  4 mg Oral Q6H PRN Fuller Plan A, MD       Or  . ondansetron (ZOFRAN) injection 4 mg  4 mg Intravenous Q6H PRN Norval Morton, MD         Discharge Medications: Please see discharge summary for a list of discharge medications.  Relevant Imaging Results:  Relevant Lab Results:   Additional Information SSN: [id number]  Jorge Ny, LCSW

## 2017-08-15 NOTE — Progress Notes (Signed)
New Admission Note:  Arrival Method: Stretcher Mental Orientation: Alert to self only  Telemetry: Box 13 Assessment: Completed Skin: Warm and dry. Stage 2 sacrum. Foam dressing applied. IV: NSL  Pain: Denies  Tubes: N/A Safety Measures: Safety Fall Prevention Plan initiated.  Low bed in place  Admission: Completed 2 [place] Orientation: Patient has been kept re orientated to the room, unit and the staff. Family: None   Orders have been reviewed and implemented. Will continue to monitor the patient. Call light has been placed within reach and bed alarm has been activated.   Sima Matas BSN, RN  Phone Number: [phone]

## 2017-08-16 LAB — BASIC METABOLIC PANEL
Anion gap: 8 (ref 5–15)
BUN: 6 mg/dL (ref 6–20)
CALCIUM: 7.8 mg/dL — AB (ref 8.9–10.3)
CO2: 23 mmol/L (ref 22–32)
CREATININE: 0.71 mg/dL (ref 0.44–1.00)
Chloride: 110 mmol/L (ref 101–111)
GFR calc non Af Amer: >60 mL/min (ref >60)
Glucose, Bld: 94 mg/dL (ref 65–99)
Potassium: 3.1 mmol/L — ABNORMAL LOW (ref 3.5–5.1)
SODIUM: 141 mmol/L (ref 135–145)

## 2017-08-16 MED ORDER — MUPIROCIN 2 % EX OINT: 1.0000 "application " | TOPICAL_OINTMENT | Freq: Two times a day (BID) | CUTANEOUS | 0 refills | Status: AC

## 2017-08-16 MED ORDER — POTASSIUM CHLORIDE CRYS ER 20 MEQ PO TBCR
40.0000 meq | EXTENDED_RELEASE_TABLET | ORAL | Status: DC
  Administered 2017-08-16: 40 meq via ORAL
  Filled 2017-08-16: qty 2

## 2017-08-16 MED ORDER — CEPHALEXIN 500 MG PO CAPS: 500.0000 mg | ORAL_CAPSULE | Freq: Two times a day (BID) | ORAL | 0 refills | Status: AC

## 2017-08-16 MED ORDER — ENOXAPARIN SODIUM 40 MG/0.4ML ~~LOC~~ SOLN
40.0000 mg | SUBCUTANEOUS | Status: DC
  Administered 2017-08-16: 40 mg via SUBCUTANEOUS
  Filled 2017-08-16: qty 0.4

## 2017-08-16 NOTE — Discharge Summary (Signed)
Physician Discharge Summary  [name]  ZLD:[id number]  DOB: [birth year]  DOA: 08/14/2017 PCP: Jodi Marble, MD  Admit date: 08/14/2017 Discharge date: 08/16/2017  Admitted From: SNF  Disposition:  SNF   Recommendations for Outpatient Follow-up:  1. Follow up with PCP in 1-2 weeks 2. Please obtain BMP/CBC in one week to monitor hgb and potassium   3. Please follow up on the following pending results: Urine Sensitivities Grew E.Coli   Discharge Condition: Fair  CODE STATUS: Full code  Diet recommendation: Dysphagia 2  Brief/Interim Summary: [name] is a 83 year old female with medical history significant for hypertension, CVA, dementia, chronic kidney disease who was sent from her SNF after being found more confused from her baseline.  She was also noted to be agitated when she abnormalities, and pleasant.  Upon ED evaluation she was found to be febrile up to 101.2, UA grossly abnormal for infection, elevated white count and lactic acid of 2.4. Patient was admitted with working diagnosis of acute metabolic encephalopathy due to UTI and she was started on empiric antibiotic. Urine culture grew staph aureus, patient has significantly improve on Ceftriaxone. Patient now back to baseline mentally. Patient will continue keflex to complete 7 days. Will follow sensitivities for appropriate coverage.  Subjective: Patient seen and examined, pleasant, confused. No complaints, drinking her shakes. Remains afebrile   Discharge Diagnoses/Hospital Course:  Sepsis secondary to urinary tract infection Sepsis physiology has improved  Patient started on empiric abx, Rocephin, Urine cultures grew staph aureus, however patient clinically improving, likely not MRSA. Since improving on Rocephin will d/c on Keflex to complete 7 days of abx. Will follow sensitivities and make adjustment as needed  Blood cultures no growth thus far  Acute metabolic encephalopathy secondary to UTI - Resolved  In  setting of dementia, per family member she is at baseline at this point  CT head with no acute abnormalities  Treat underlying causes  UTI - Staph Aureus  See above - Keflex for 5 more days, will follow sensitivities   Hypokalemia  Repleted  Check BMP in 1 week   Essential hypertension Bp stable during hospital stay  Continue Amlodipine.   Acute renal failure on Chronic kidney disease stage III Baseline Cr ~ 1.  Cr improved > 0.4 in 48 hrs after hydration Check BMP in 1 week   Dementia Not on medication for this    Chronic pain syndrome/peripheral neuropathy Continue Cymbalta and gabapentin   All other chronic medical condition were stable during the hospitalization.  On the day of the discharge the patient's vitals were stable, and no other acute medical condition were reported by patient. the patient was felt safe to be discharge to home   Discharge Instructions  You were cared for by a hospitalist during your hospital stay. If you have any questions about your discharge medications or the care you received while you were in the hospital after you are discharged, you can call the unit and asked to speak with the hospitalist on call if the hospitalist that took care of you is not available. Once you are discharged, your primary care physician will handle any further medical issues. Please note that NO REFILLS for any discharge medications will be authorized once you are discharged, as it is imperative that you return to your primary care physician (or establish a relationship with a primary care physician if you do not have one) for your aftercare needs so that they can reassess your need for  medications and monitor your lab values.  Discharge Instructions    Call MD for:  difficulty breathing, headache or visual disturbances   Complete by:  As directed    Call MD for:  extreme fatigue   Complete by:  As directed    Call MD for:  hives   Complete by:  As directed    Call  MD for:  persistant dizziness or light-headedness   Complete by:  As directed    Call MD for:  persistant nausea and vomiting   Complete by:  As directed    Call MD for:  redness, tenderness, or signs of infection (pain, swelling, redness, odor or green/yellow discharge around incision site)   Complete by:  As directed    Call MD for:  severe uncontrolled pain   Complete by:  As directed    Call MD for:  temperature >100.4   Complete by:  As directed    Diet - low sodium heart healthy   Complete by:  As directed    Increase activity slowly   Complete by:  As directed      Allergies as of 08/16/2017      Reactions   Codeine Other (See Comments)   Hallucinations   Ibuprofen Other (See Comments)   Confusion      Medication List    TAKE these medications   acetaminophen 500 MG tablet Commonly known as:  TYLENOL Take 1,000 mg 2 (two) times daily by mouth.   amLODipine 5 MG tablet Commonly known as:  NORVASC Take 1 tablet (5 mg total) by mouth daily.   busPIRone 5 MG tablet Commonly known as:  BUSPAR Take 5 mg 2 (two) times daily by mouth.   CALCIUM 600 600 MG Tabs tablet Generic drug:  calcium carbonate Take 600 mg 2 (two) times daily with a meal by mouth.   cephALEXin 500 MG capsule Commonly known as:  KEFLEX Take 1 capsule (500 mg total) 2 (two) times daily for 5 days by mouth.   cholecalciferol 1000 units tablet Commonly known as:  VITAMIN D Take 1,000 Units by mouth daily.   DULoxetine 30 MG capsule Commonly known as:  CYMBALTA Take 30 mg 2 (two) times daily by mouth.   ferrous sulfate 325 (65 FE) MG tablet Take 325 mg daily by mouth.   gabapentin 600 MG tablet Commonly known as:  NEURONTIN Take 600 mg by mouth 3 (three) times daily.   hydrOXYzine 10 MG tablet Commonly known as:  ATARAX/VISTARIL Take 10 mg See admin instructions by mouth. Take 2 tablets (20 mg) with a 25 mg tablet for a 45 mg dose - three times daily for itching   hydrOXYzine 25 MG  tablet Commonly known as:  ATARAX/VISTARIL Take 25 mg See admin instructions by mouth. Take 1 tablet (25 mg) with #2 10 mg tablets (20 mg) for a 45 mg dose - three times daily for itching / may also take 1 tablet (25 mg) by mouth every 8 hours as needed for itching.   Lactulose 20 GM/30ML Soln Take 40 g 4 (four) times daily by mouth.   loperamide 2 MG tablet Commonly known as:  IMODIUM A-D Take 2 mg every 6 (six) hours as needed by mouth for diarrhea or loose stools.   methocarbamol 500 MG tablet Commonly known as:  ROBAXIN Take 500 mg every 12 (twelve) hours by mouth.   Milk Thistle 250 MG Caps Take 250 mg daily by mouth.   mupirocin ointment 2 %  Commonly known as:  BACTROBAN Place 1 application 2 (two) times daily into the nose.   NUTRITIONAL DRINK PO Take 120 mLs 3 (three) times daily by mouth. Med Pass -House 2.0   ranitidine 150 MG tablet Commonly known as:  ZANTAC Take 150 mg by mouth at bedtime.   REFRESH LIQUIGEL OP Place 1 drop 3 (three) times daily into both eyes.   REFRESH P.M. OP Place 1 strip at bedtime into both eyes.   traMADol 50 MG tablet Commonly known as:  ULTRAM Take one tablet by mouth three times daily as needed for pain. Hold if sleepy What changed:    how much to take  how to take this  when to take this  additional instructions       Contact information for follow-up providers    Jodi Marble, MD. Schedule an appointment as soon as possible for a visit in 1 week(s).   Specialty:  Internal Medicine Why:  Hospital follow up  Contact information: [place] [address] [phone]            Contact information for after-discharge care    Destination    [facility] Follow [facility].   Service:  Skilled Nursing Contact information: [place]                 Allergies  Allergen Reactions  . Codeine Other (See Comments)     Hallucinations  . Ibuprofen Other (See Comments)    Confusion    Consultations   Procedures/Studies: Dg Chest 2 View  Result Date: 08/14/2017 CLINICAL DATA:  Unequal pupils and confusion. EXAM: CHEST  2 VIEW COMPARISON:  12/19/2015, 10/29/2014 FINDINGS: Lungs are adequately inflated without consolidation or effusion. Cardiomediastinal silhouette is within normal. Degenerative change of the spine. Calcified plaque over the thoracic aorta. Moderate degenerate change of the shoulders. IMPRESSION: No active cardiopulmonary disease. Electronically Signed   By: Marin Olp M.D.   On: 08/14/2017 18:58   Ct Head Wo Contrast  Result Date: 08/14/2017 CLINICAL DATA:  Pupillary dysfunction EXAM: CT HEAD WITHOUT CONTRAST TECHNIQUE: Contiguous axial images were obtained from the base of the skull through the vertex without intravenous contrast. COMPARISON:  CT 06/07/2017 FINDINGS: Brain: No mass lesion, intraparenchymal hemorrhage or extra-axial collection. No evidence of acute cortical infarct. There is periventricular hypoattenuation compatible with chronic microvascular disease. Vascular: No hyperdense vessel or unexpected calcification. Skull: Normal visualized skull base, calvarium and extracranial soft tissues. Sinuses/Orbits: No sinus fluid levels or advanced mucosal thickening. No mastoid effusion. Normal orbits. IMPRESSION: Chronic ischemic microangiopathy without acute abnormality. Electronically Signed   By: Ulyses Jarred M.D.   On: 08/14/2017 21:13    Discharge Exam: Vitals:   08/16/17 0550 08/16/17 1000  BP: 125/67 136/75  Pulse: 85 90  Resp: 18 18  Temp: 99.2 F (37.3 C) 98 F (36.7 C)  SpO2: 100% 100%   Vitals:   08/15/17 1802 08/15/17 2130 08/16/17 0550 08/16/17 1000  BP: (!) 117/59 127/67 125/67 136/75  Pulse: 94 91 85 90  Resp: 18 18 18 18   Temp: 98.4 F (36.9 C) 98 F (36.7 C) 99.2 F (37.3 C) 98 F (36.7 C)  TempSrc: Oral Oral Oral Oral  SpO2: 99% 98% 100% 100%   Weight:      Height:        General: Pt is alert, awake, not in acute distress Cardiovascular: RRR, S1/S2  Respiratory: CTA bilaterally,  Abdominal: Soft, NT, ND, bowel sounds + Neurology: Confuse, oriented to person only, follow commands, non focal  Extremities: no edema  The results of significant diagnostics from this hospitalization (including imaging, microbiology, ancillary and laboratory) are listed below for reference.     Microbiology: Recent Results (from the past 240 hour(s))  Urine culture     Status: Abnormal (Preliminary result)   Collection Time: 08/14/17  8:17 PM  Result Value Ref Range Status   Specimen Description URINE, CATHETERIZED  Final   Special Requests NONE  Final   Culture 80,000 COLONIES/mL STAPHYLOCOCCUS AUREUS (A)  Final   Report Status PENDING  Incomplete  Blood culture (routine x 2)     Status: None (Preliminary result)   Collection Time: 08/14/17  8:25 PM  Result Value Ref Range Status   Specimen Description BLOOD BLOOD RIGHT FOREARM  Final   Special Requests   Final    BOTTLES DRAWN AEROBIC AND ANAEROBIC Blood Culture adequate volume   Culture NO GROWTH < 24 HOURS  Final   Report Status PENDING  Incomplete  Blood culture (routine x 2)     Status: None (Preliminary result)   Collection Time: 08/14/17  8:46 PM  Result Value Ref Range Status   Specimen Description BLOOD LEFT HAND  Final   Special Requests IN PEDIATRIC BOTTLE Blood Culture adequate volume  Final   Culture NO GROWTH < 24 HOURS  Final   Report Status PENDING  Incomplete  MRSA PCR Screening     Status: Abnormal   Collection Time: 08/15/17 12:16 AM  Result Value Ref Range Status   MRSA by PCR POSITIVE (A) NEGATIVE Final    Comment:        The GeneXpert MRSA Assay (FDA approved for NASAL specimens only), is one component of a comprehensive MRSA colonization surveillance program. It is not intended to diagnose MRSA infection nor to guide or monitor treatment for MRSA  infections. RESULT CALLED TO, READ BACK BY AND VERIFIED WITH: C. Yap RN 14:15 08/15/17 ([email or web address])      Labs: BNP (last 3 results) No results for input(s): BNP in the last 8760 hours. Basic Metabolic Panel: Recent Labs  Lab 08/14/17 1759 08/15/17 0015 08/16/17 0313  NA 138 137 141  K 4.5 3.3* 3.1*  CL 104 106 110  CO2 26 23 23   GLUCOSE 129* 94 94  BUN 15 12 6   CREATININE 1.28* 0.96 0.71  CALCIUM 8.7* 7.9* 7.8*  MG  --  1.4*  --    Liver Function Tests: Recent Labs  Lab 08/14/17 1759  AST 42*  ALT 15  ALKPHOS 68  BILITOT 0.7  PROT 9.1*  ALBUMIN 3.0*   No results for input(s): LIPASE, AMYLASE in the last 168 hours. Recent Labs  Lab 08/15/17 0015  AMMONIA 37*   CBC: Recent Labs  Lab 08/14/17 1759 08/15/17 0015  WBC 11.5* 9.9  NEUTROABS 6.9  --   HGB 11.0* 10.6*  HCT 34.2* 32.1*  MCV 90.7 90.4  PLT 269 245   Cardiac Enzymes: No results for input(s): CKTOTAL, CKMB, CKMBINDEX, TROPONINI in the last 168 hours. BNP: Invalid input(s): POCBNP CBG: Recent Labs  Lab 08/14/17 2057  GLUCAP 97   D-Dimer No results for input(s): DDIMER in the last 72 hours. Hgb A1c No results for input(s): HGBA1C in the last 72 hours. Lipid Profile No results for input(s): CHOL, HDL, LDLCALC, TRIG, CHOLHDL, LDLDIRECT in the last 72 hours. Thyroid function studies No results for input(s): TSH, T4TOTAL, T3FREE,  THYROIDAB in the last 72 hours.  Invalid input(s): FREET3 Anemia work up No results for input(s): VITAMINB12, FOLATE, FERRITIN, TIBC, IRON, RETICCTPCT in the last 72 hours. Urinalysis    Component Value Date/Time   COLORURINE AMBER (A) 08/14/2017 2039   APPEARANCEUR CLOUDY (A) 08/14/2017 2039   LABSPEC 1.024 08/14/2017 2039   PHURINE 5.0 08/14/2017 2039   GLUCOSEU NEGATIVE 08/14/2017 2039   GLUCOSEU NEG mg/dL 01/26/2009 2235   HGBUR SMALL (A) 08/14/2017 2039   BILIRUBINUR SMALL (A) 08/14/2017 2039   KETONESUR NEGATIVE 08/14/2017 2039   PROTEINUR 100 (A)  08/14/2017 2039   UROBILINOGEN 1.0 10/28/2014 1206   NITRITE NEGATIVE 08/14/2017 2039   LEUKOCYTESUR LARGE (A) 08/14/2017 2039   Sepsis Labs Invalid input(s): PROCALCITONIN,  WBC,  LACTICIDVEN Microbiology Recent Results (from the past 240 hour(s))  Urine culture     Status: Abnormal (Preliminary result)   Collection Time: 08/14/17  8:17 PM  Result Value Ref Range Status   Specimen Description URINE, CATHETERIZED  Final   Special Requests NONE  Final   Culture 80,000 COLONIES/mL STAPHYLOCOCCUS AUREUS (A)  Final   Report Status PENDING  Incomplete  Blood culture (routine x 2)     Status: None (Preliminary result)   Collection Time: 08/14/17  8:25 PM  Result Value Ref Range Status   Specimen Description BLOOD BLOOD RIGHT FOREARM  Final   Special Requests   Final    BOTTLES DRAWN AEROBIC AND ANAEROBIC Blood Culture adequate volume   Culture NO GROWTH < 24 HOURS  Final   Report Status PENDING  Incomplete  Blood culture (routine x 2)     Status: None (Preliminary result)   Collection Time: 08/14/17  8:46 PM  Result Value Ref Range Status   Specimen Description BLOOD LEFT HAND  Final   Special Requests IN PEDIATRIC BOTTLE Blood Culture adequate volume  Final   Culture NO GROWTH < 24 HOURS  Final   Report Status PENDING  Incomplete  MRSA PCR Screening     Status: Abnormal   Collection Time: 08/15/17 12:16 AM  Result Value Ref Range Status   MRSA by PCR POSITIVE (A) NEGATIVE Final    Comment:        The GeneXpert MRSA Assay (FDA approved for NASAL specimens only), is one component of a comprehensive MRSA colonization surveillance program. It is not intended to diagnose MRSA infection nor to guide or monitor treatment for MRSA infections. RESULT CALLED TO, READ BACK BY AND VERIFIED WITH: C. James Ivanoff RN 14:15 08/15/17 ([email or web address])      Time coordinating discharge: 30 minutes  SIGNED:  Chipper Oman, MD  [facility] 08/16/2017, 1:09 PM  Pager please text page via   [email or web address] Password [email or web address]

## 2017-08-16 NOTE — Progress Notes (Signed)
Report given to [place] at [facility]. Waiting for [facility] to transport pt.

## 2017-08-16 NOTE — Progress Notes (Signed)
Patient will discharge to [facility] Anticipated discharge date: 11/7 Family notified: left messages for pt son Transportation by [facility]- called at 2:50pm  Elizabeth signing off.  Jorge Ny, LCSW Clinical Social Worker [phone]

## 2017-08-16 NOTE — Plan of Care (Signed)
Pt progressing toward goals.  [name], [facility], [place] Speech Language Pathologist [phone]

## 2017-08-16 NOTE — Progress Notes (Signed)
  Speech Language Pathology Treatment: Dysphagia  Patient Details Name: [name] MRN: [id number] DOB: Jun 27, 1925 Today's Date: 08/16/2017 Time: 3614-4315 SLP Time Calculation (min) (ACUTE ONLY): 20 min  Assessment / Plan / Recommendation Clinical Impression  Pt seen at bedside for follow up after BSE completed yesterday. RN reports pt eats approximately half of her meals, and does not exhibit overt s/s aspiration. Pt was observed with thin liquid, and did not demonstrate coughing or voice quality change. Safe swallow precautions posted at [facility]. ST will continue to follow briefly for assessment of diet tolerance and education.   HPI HPI: Pt is a89 y.o.femaleadmitted from SNF with AMS likely due to sepsis from UTI. CXR and CT Head without acute changes upon admission. PMH includesHTN, CVA, dementia,chronic pain, CKD, GERD, cataracts      SLP Plan   continue per POC       Recommendations  Diet recommendations: Dysphagia 2 (fine chop);Thin liquid Liquids provided via: Cup;Straw Medication Administration: Whole meds with puree Supervision: Staff to assist with self feeding;Full supervision/cueing for compensatory strategies Compensations: Slow rate;Small sips/bites;Minimize environmental distractions;Follow solids with liquid Postural Changes and/or Swallow Maneuvers: Seated upright 90 degrees                Oral Care Recommendations: Oral care BID Follow up Recommendations: Skilled Nursing facility;24 hour supervision/assistance SLP Visit Diagnosis: Dysphagia, oral phase (R13.11)       GO               [name] B. Quentin Ore [facility], CCC-SLP Speech Language Pathologist [phone]  [name] 08/16/2017, 11:46 AM

## 2017-08-17 LAB — URINE CULTURE: Culture: 80000 — AB

## 2017-08-17 NOTE — Plan of Care (Signed)
[facility]   Reviewed urine culture - grew MRSA. I have called [name] and reported new findings, need to change antibiotics. New verbal order given to [name] whom will relate message to Tammy Bobby Rumpf RN. Stop Keflex and start doxycycline 100 mg q 12 for 5 days.   Chipper Oman MD

## 2017-08-19 LAB — CULTURE, BLOOD (ROUTINE X 2)
CULTURE: NO GROWTH
CULTURE: NO GROWTH
SPECIAL REQUESTS: ADEQUATE
Special Requests: ADEQUATE

## 2019-06-11 DEATH — deceased
